# Patient Record
Sex: Female | Born: 1940 | Race: White | Hispanic: No | Marital: Married | Smoking: Never smoker
Health system: Southern US, Community
[De-identification: ages and names within clinical notes are randomized; demographics above are authoritative.]

## PROBLEM LIST (undated history)

## (undated) DIAGNOSIS — F0390 Unspecified dementia without behavioral disturbance: Secondary | ICD-10-CM

## (undated) DIAGNOSIS — E039 Hypothyroidism, unspecified: Secondary | ICD-10-CM

## (undated) DIAGNOSIS — E669 Obesity, unspecified: Secondary | ICD-10-CM

## (undated) DIAGNOSIS — M1990 Unspecified osteoarthritis, unspecified site: Secondary | ICD-10-CM

## (undated) DIAGNOSIS — F419 Anxiety disorder, unspecified: Secondary | ICD-10-CM

## (undated) DIAGNOSIS — K219 Gastro-esophageal reflux disease without esophagitis: Secondary | ICD-10-CM

## (undated) DIAGNOSIS — E119 Type 2 diabetes mellitus without complications: Secondary | ICD-10-CM

## (undated) DIAGNOSIS — I10 Essential (primary) hypertension: Secondary | ICD-10-CM

## (undated) DIAGNOSIS — M545 Low back pain: Secondary | ICD-10-CM

## (undated) DIAGNOSIS — Z86010 Personal history of colonic polyps: Secondary | ICD-10-CM

## (undated) DIAGNOSIS — M47812 Spondylosis without myelopathy or radiculopathy, cervical region: Secondary | ICD-10-CM

## (undated) DIAGNOSIS — E785 Hyperlipidemia, unspecified: Secondary | ICD-10-CM

## (undated) DIAGNOSIS — F329 Major depressive disorder, single episode, unspecified: Secondary | ICD-10-CM

## (undated) DIAGNOSIS — D649 Anemia, unspecified: Secondary | ICD-10-CM

## (undated) DIAGNOSIS — F32A Depression, unspecified: Secondary | ICD-10-CM

## (undated) HISTORY — DX: Gastro-esophageal reflux disease without esophagitis: K21.9

## (undated) HISTORY — DX: Unspecified osteoarthritis, unspecified site: M19.90

## (undated) HISTORY — PX: TUBAL LIGATION: SHX77

## (undated) HISTORY — PX: HEMORRHOID SURGERY: SHX153

## (undated) HISTORY — DX: Obesity, unspecified: E66.9

## (undated) HISTORY — PX: TONSILLECTOMY: SHX5217

## (undated) HISTORY — DX: Low back pain: M54.5

## (undated) HISTORY — DX: Personal history of colonic polyps: Z86.010

## (undated) HISTORY — DX: Essential (primary) hypertension: I10

## (undated) HISTORY — DX: Type 2 diabetes mellitus without complications: E11.9

## (undated) HISTORY — DX: Hyperlipidemia, unspecified: E78.5

## (undated) HISTORY — DX: Hypothyroidism, unspecified: E03.9

## (undated) HISTORY — DX: Spondylosis without myelopathy or radiculopathy, cervical region: M47.812

---

## 2001-07-12 ENCOUNTER — Other Ambulatory Visit: Admission: RE | Admit: 2001-07-12 | Discharge: 2001-07-12 | Payer: Self-pay | Admitting: Obstetrics and Gynecology

## 2002-10-10 ENCOUNTER — Other Ambulatory Visit: Admission: RE | Admit: 2002-10-10 | Discharge: 2002-10-10 | Payer: Self-pay | Admitting: Obstetrics and Gynecology

## 2003-10-16 ENCOUNTER — Other Ambulatory Visit: Admission: RE | Admit: 2003-10-16 | Discharge: 2003-10-16 | Payer: Self-pay | Admitting: Obstetrics and Gynecology

## 2004-02-24 ENCOUNTER — Ambulatory Visit: Payer: Self-pay | Admitting: Internal Medicine

## 2004-06-08 ENCOUNTER — Ambulatory Visit: Payer: Self-pay | Admitting: Internal Medicine

## 2004-07-27 ENCOUNTER — Ambulatory Visit: Payer: Self-pay | Admitting: Licensed Clinical Social Worker

## 2004-08-24 ENCOUNTER — Ambulatory Visit: Payer: Self-pay | Admitting: Internal Medicine

## 2004-09-10 ENCOUNTER — Ambulatory Visit: Payer: Self-pay | Admitting: Internal Medicine

## 2004-12-28 ENCOUNTER — Ambulatory Visit: Payer: Self-pay | Admitting: Internal Medicine

## 2005-04-09 ENCOUNTER — Ambulatory Visit: Payer: Self-pay | Admitting: Internal Medicine

## 2005-08-09 ENCOUNTER — Ambulatory Visit: Payer: Self-pay | Admitting: Internal Medicine

## 2005-11-08 ENCOUNTER — Ambulatory Visit: Payer: Self-pay | Admitting: Internal Medicine

## 2006-02-09 ENCOUNTER — Ambulatory Visit: Payer: Self-pay | Admitting: Internal Medicine

## 2006-07-07 ENCOUNTER — Ambulatory Visit: Payer: Self-pay | Admitting: Internal Medicine

## 2006-10-12 ENCOUNTER — Ambulatory Visit: Payer: Self-pay | Admitting: Internal Medicine

## 2006-10-12 LAB — CONVERTED CEMR LAB
AST: 15 units/L (ref 0–37)
Albumin: 3.9 g/dL (ref 3.5–5.2)
Basophils Absolute: 0 10*3/uL (ref 0.0–0.1)
Chloride: 101 meq/L (ref 96–112)
Direct LDL: 134.7 mg/dL
Eosinophils Absolute: 0.2 10*3/uL (ref 0.0–0.6)
GFR calc Af Amer: 92 mL/min
GFR calc non Af Amer: 76 mL/min
HCT: 45.2 % (ref 36.0–46.0)
HDL: 50.1 mg/dL (ref >39.0)
Hgb A1c MFr Bld: 7.5 % — ABNORMAL HIGH (ref 4.6–6.0)
MCHC: 33.3 g/dL (ref 30.0–36.0)
MCV: 88.8 fL (ref 78.0–100.0)
Neutro Abs: 4 10*3/uL (ref 1.4–7.7)
Neutrophils Relative %: 54.5 % (ref 43.0–77.0)
Potassium: 3.5 meq/L (ref 3.5–5.1)
RBC: 5.09 M/uL (ref 3.87–5.11)
Sodium: 142 meq/L (ref 135–145)
TSH: 0.52 microintl units/mL (ref 0.35–5.50)
Triglycerides: 143 mg/dL (ref 0–149)

## 2006-10-14 ENCOUNTER — Encounter: Payer: Self-pay | Admitting: Internal Medicine

## 2006-10-14 DIAGNOSIS — K219 Gastro-esophageal reflux disease without esophagitis: Secondary | ICD-10-CM

## 2006-10-14 DIAGNOSIS — M1990 Unspecified osteoarthritis, unspecified site: Secondary | ICD-10-CM

## 2006-10-14 DIAGNOSIS — E785 Hyperlipidemia, unspecified: Secondary | ICD-10-CM

## 2006-10-14 DIAGNOSIS — I10 Essential (primary) hypertension: Secondary | ICD-10-CM

## 2006-10-14 DIAGNOSIS — E039 Hypothyroidism, unspecified: Secondary | ICD-10-CM

## 2006-10-14 HISTORY — DX: Unspecified osteoarthritis, unspecified site: M19.90

## 2006-10-14 HISTORY — DX: Gastro-esophageal reflux disease without esophagitis: K21.9

## 2006-10-14 HISTORY — DX: Hypothyroidism, unspecified: E03.9

## 2006-10-14 HISTORY — DX: Hyperlipidemia, unspecified: E78.5

## 2006-10-14 HISTORY — DX: Essential (primary) hypertension: I10

## 2007-02-06 ENCOUNTER — Encounter: Payer: Self-pay | Admitting: Internal Medicine

## 2007-03-24 ENCOUNTER — Encounter: Payer: Self-pay | Admitting: Internal Medicine

## 2007-03-30 ENCOUNTER — Ambulatory Visit: Payer: Self-pay | Admitting: Internal Medicine

## 2007-06-13 ENCOUNTER — Telehealth: Payer: Self-pay | Admitting: Internal Medicine

## 2007-06-30 ENCOUNTER — Telehealth: Payer: Self-pay | Admitting: Internal Medicine

## 2007-06-30 ENCOUNTER — Encounter: Payer: Self-pay | Admitting: Internal Medicine

## 2007-08-03 ENCOUNTER — Ambulatory Visit: Payer: Self-pay | Admitting: Internal Medicine

## 2007-08-04 DIAGNOSIS — M545 Low back pain, unspecified: Secondary | ICD-10-CM

## 2007-08-04 HISTORY — DX: Low back pain, unspecified: M54.50

## 2007-10-12 ENCOUNTER — Encounter: Payer: Self-pay | Admitting: Internal Medicine

## 2007-11-02 ENCOUNTER — Ambulatory Visit: Payer: Self-pay | Admitting: Internal Medicine

## 2007-11-07 ENCOUNTER — Telehealth: Payer: Self-pay | Admitting: Internal Medicine

## 2007-11-20 ENCOUNTER — Telehealth: Payer: Self-pay | Admitting: Internal Medicine

## 2007-12-29 ENCOUNTER — Telehealth: Payer: Self-pay | Admitting: Internal Medicine

## 2008-02-07 ENCOUNTER — Ambulatory Visit: Payer: Self-pay | Admitting: Internal Medicine

## 2008-02-07 DIAGNOSIS — Z860100 Personal history of colon polyps, unspecified: Secondary | ICD-10-CM | POA: Insufficient documentation

## 2008-02-07 HISTORY — DX: Personal history of colonic polyps: Z86.010

## 2008-02-07 HISTORY — DX: Personal history of colon polyps, unspecified: Z86.0100

## 2008-02-27 ENCOUNTER — Telehealth: Payer: Self-pay | Admitting: Internal Medicine

## 2008-06-13 ENCOUNTER — Ambulatory Visit: Payer: Self-pay | Admitting: Internal Medicine

## 2008-06-14 ENCOUNTER — Telehealth: Payer: Self-pay | Admitting: Internal Medicine

## 2008-07-15 ENCOUNTER — Ambulatory Visit: Payer: Self-pay | Admitting: Internal Medicine

## 2008-07-15 DIAGNOSIS — M25569 Pain in unspecified knee: Secondary | ICD-10-CM

## 2008-09-03 ENCOUNTER — Encounter: Payer: Self-pay | Admitting: Internal Medicine

## 2008-09-09 ENCOUNTER — Ambulatory Visit: Payer: Self-pay | Admitting: Internal Medicine

## 2008-09-09 DIAGNOSIS — M79609 Pain in unspecified limb: Secondary | ICD-10-CM

## 2008-09-09 DIAGNOSIS — L259 Unspecified contact dermatitis, unspecified cause: Secondary | ICD-10-CM

## 2008-12-19 ENCOUNTER — Telehealth: Payer: Self-pay | Admitting: Internal Medicine

## 2009-01-06 ENCOUNTER — Ambulatory Visit: Payer: Self-pay | Admitting: Internal Medicine

## 2009-02-07 ENCOUNTER — Telehealth: Payer: Self-pay | Admitting: Internal Medicine

## 2009-04-04 ENCOUNTER — Telehealth: Payer: Self-pay | Admitting: Internal Medicine

## 2009-04-24 ENCOUNTER — Ambulatory Visit: Payer: Self-pay | Admitting: Internal Medicine

## 2009-05-01 ENCOUNTER — Telehealth: Payer: Self-pay | Admitting: Internal Medicine

## 2009-06-09 ENCOUNTER — Telehealth: Payer: Self-pay | Admitting: Internal Medicine

## 2009-06-30 ENCOUNTER — Telehealth: Payer: Self-pay | Admitting: Internal Medicine

## 2009-07-01 ENCOUNTER — Telehealth: Payer: Self-pay | Admitting: Internal Medicine

## 2009-07-03 ENCOUNTER — Telehealth: Payer: Self-pay

## 2009-07-28 ENCOUNTER — Ambulatory Visit: Payer: Self-pay | Admitting: Internal Medicine

## 2009-07-30 LAB — CONVERTED CEMR LAB: Hgb A1c MFr Bld: 8.2 % — ABNORMAL HIGH (ref 4.6–6.5)

## 2009-07-31 ENCOUNTER — Telehealth: Payer: Self-pay | Admitting: Internal Medicine

## 2009-08-04 ENCOUNTER — Encounter: Payer: Self-pay | Admitting: Internal Medicine

## 2009-08-20 ENCOUNTER — Encounter: Payer: Self-pay | Admitting: Internal Medicine

## 2009-08-22 ENCOUNTER — Telehealth: Payer: Self-pay | Admitting: Internal Medicine

## 2009-09-01 ENCOUNTER — Encounter: Payer: Self-pay | Admitting: Internal Medicine

## 2009-10-09 ENCOUNTER — Telehealth: Payer: Self-pay

## 2009-10-15 ENCOUNTER — Telehealth: Payer: Self-pay | Admitting: Internal Medicine

## 2009-10-16 ENCOUNTER — Telehealth: Payer: Self-pay | Admitting: Internal Medicine

## 2009-10-27 ENCOUNTER — Ambulatory Visit: Payer: Self-pay | Admitting: Internal Medicine

## 2009-10-27 DIAGNOSIS — E119 Type 2 diabetes mellitus without complications: Secondary | ICD-10-CM | POA: Insufficient documentation

## 2009-10-27 HISTORY — DX: Type 2 diabetes mellitus without complications: E11.9

## 2009-10-27 LAB — CONVERTED CEMR LAB: Blood Glucose, Fingerstick: 112

## 2009-10-28 LAB — CONVERTED CEMR LAB: Hgb A1c MFr Bld: 8.4 % — ABNORMAL HIGH (ref 4.6–6.5)

## 2010-01-27 ENCOUNTER — Telehealth: Payer: Self-pay | Admitting: Internal Medicine

## 2010-02-05 ENCOUNTER — Ambulatory Visit: Payer: Self-pay | Admitting: Internal Medicine

## 2010-02-05 LAB — HM DIABETES FOOT EXAM: HM Diabetic Foot Exam: NORMAL

## 2010-02-05 LAB — CONVERTED CEMR LAB: Blood Glucose, Fingerstick: 129

## 2010-02-06 LAB — CONVERTED CEMR LAB: Hgb A1c MFr Bld: 8.2 % — ABNORMAL HIGH (ref 4.6–6.5)

## 2010-02-27 ENCOUNTER — Encounter: Payer: Self-pay | Admitting: Internal Medicine

## 2010-03-06 ENCOUNTER — Telehealth: Payer: Self-pay | Admitting: Internal Medicine

## 2010-03-16 ENCOUNTER — Telehealth: Payer: Self-pay

## 2010-03-18 ENCOUNTER — Telehealth: Payer: Self-pay

## 2010-05-06 ENCOUNTER — Telehealth: Payer: Self-pay

## 2010-05-07 ENCOUNTER — Telehealth: Payer: Self-pay

## 2010-05-08 ENCOUNTER — Telehealth: Payer: Self-pay | Admitting: Internal Medicine

## 2010-05-14 ENCOUNTER — Ambulatory Visit
Admission: RE | Admit: 2010-05-14 | Discharge: 2010-05-14 | Payer: Self-pay | Source: Home / Self Care | Attending: Internal Medicine | Admitting: Internal Medicine

## 2010-05-14 ENCOUNTER — Other Ambulatory Visit: Payer: Self-pay | Admitting: Internal Medicine

## 2010-05-14 ENCOUNTER — Telehealth: Payer: Self-pay | Admitting: Internal Medicine

## 2010-05-14 LAB — HEMOGLOBIN A1C: Hgb A1c MFr Bld: 7.5 % — ABNORMAL HIGH (ref 4.6–6.5)

## 2010-05-14 LAB — CONVERTED CEMR LAB: Blood Glucose, Fingerstick: 144

## 2010-05-17 LAB — CONVERTED CEMR LAB
ALT: 37 units/L — ABNORMAL HIGH (ref 0–35)
Alkaline Phosphatase: 77 units/L (ref 39–117)
BUN: 18 mg/dL (ref 6–23)
Basophils Absolute: 0.1 10*3/uL (ref 0.0–0.1)
Basophils Absolute: 0.1 10*3/uL (ref 0.0–0.1)
Basophils Relative: 0.9 % (ref 0.0–3.0)
Bilirubin, Direct: 0.1 mg/dL (ref 0.0–0.3)
Bilirubin, Direct: 0.1 mg/dL (ref 0.0–0.3)
CO2: 27 meq/L (ref 19–32)
Calcium: 8.8 mg/dL (ref 8.4–10.5)
Calcium: 9.3 mg/dL (ref 8.4–10.5)
Chloride: 106 meq/L (ref 96–112)
Cholesterol: 198 mg/dL (ref 0–200)
Cholesterol: 204 mg/dL — ABNORMAL HIGH (ref 0–200)
Creatinine, Ser: 0.8 mg/dL (ref 0.4–1.2)
Creatinine, Ser: 0.8 mg/dL (ref 0.4–1.2)
Direct LDL: 131.7 mg/dL
Eosinophils Absolute: 0.1 10*3/uL (ref 0.0–0.7)
GFR calc Af Amer: 92 mL/min
Glucose, Bld: 140 mg/dL — ABNORMAL HIGH (ref 70–99)
HCT: 44.3 % (ref 36.0–46.0)
Hemoglobin: 15.5 g/dL — ABNORMAL HIGH (ref 12.0–15.0)
Lymphocytes Relative: 41.7 % (ref 12.0–46.0)
MCHC: 32.4 g/dL (ref 30.0–36.0)
MCHC: 35.1 g/dL (ref 30.0–36.0)
MCV: 88.8 fL (ref 78.0–100.0)
MCV: 91.3 fL (ref 78.0–100.0)
Monocytes Absolute: 0.4 10*3/uL (ref 0.1–1.0)
Monocytes Absolute: 0.5 10*3/uL (ref 0.1–1.0)
Neutro Abs: 3 10*3/uL (ref 1.4–7.7)
Neutrophils Relative %: 48.5 % (ref 43.0–77.0)
Platelets: 278 10*3/uL (ref 150.0–400.0)
RBC: 4.92 M/uL (ref 3.87–5.11)
RBC: 4.99 M/uL (ref 3.87–5.11)
RDW: 13.6 % (ref 11.5–14.6)
RDW: 13.6 % (ref 11.5–14.6)
Sodium: 144 meq/L (ref 135–145)
TSH: 1.72 microintl units/mL (ref 0.35–5.50)
Total Bilirubin: 0.8 mg/dL (ref 0.3–1.2)
Total Bilirubin: 0.9 mg/dL (ref 0.3–1.2)
Total CHOL/HDL Ratio: 4
Total Protein: 6.7 g/dL (ref 6.0–8.3)
Total Protein: 7.2 g/dL (ref 6.0–8.3)
Triglycerides: 146 mg/dL (ref 0.0–149.0)
Triglycerides: 88 mg/dL (ref 0–149)

## 2010-05-21 NOTE — Progress Notes (Signed)
Summary: ? about metformin  Phone Note Call from Patient Call back at Home Phone [phone]   Caller: Patient--triage vm Summary of Call: she is confused about her metformin rx. she has 2 different rxs. Is it 500mg  two times a day or 1000mg  1qd. she has been dizzy and wonders if it the confusion with directions? leave a detailed message. Initial call taken by: Warnell Forester,  March 18, 2010 10:26 AM  Follow-up for Phone Call        pt came by office with pill bottles to discuss meds and how to take. confusion r/t different shape pills in bottles. [facility]. KIK Follow-up by: Duard Brady LPN,  March 18, 2010 12:33 PM

## 2010-05-21 NOTE — Assessment & Plan Note (Signed)
Summary: 3 month fup///ccm pt rsc due back pain/njr   Vital Signs:  Patient profile:   70 year old female Height:      63 inches Weight:      216 pounds BMI:     38.40 Temp:     98.3 degrees F oral Pulse rate:   72 / minute Resp:     14 per minute BP sitting:   144 / 80  (left arm)  Vitals Entered By: Willy Eddy, LPN (February 05, 2010 2:47 PM) CC: roa- was given some diclofinac that she really liked- would like new script Is Patient Diabetic? Yes Did you bring your meter with you today? No CBG Result 129   CC:  roa- was given some diclofinac that she really liked- would like new script.  History of Present Illness: 35 -year-old patient who is seen today for follow-up of her type 2 diabetes.  She has hypertension, dyslipidemia, and osteoarthritis.  She complains of persistent arthritic pain.  She wishes to switch to diclofenac on a t.i.d. basis.  She has exogenous obesity.  She is now back on metformin after her hemoglobin A1c was greater than 8.  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 1 Once Daily As Needed 2)  Simvastatin 40 Mg Tabs (Simvastatin) .Marland Kitchen.. 1 Once Daily 3)  Synthroid 175 Mcg Tabs (Levothyroxine Sodium) .Marland Kitchen.. 1 Once Daily 4)  Benicar 20 Mg  Tabs (Olmesartan Medoxomil) .Marland Kitchen.. 1 Once Daily 5)  Prilosec 20 Mg  Cpdr (Omeprazole) .Marland Kitchen.. 1 Once Daily 6)   Naproxen 500 Mg Tabs (Naproxen) .... One Two Times A Day With Meals 7)  Metformin Hcl 500 Mg Tabs (Metformin Hcl) .... Bid 8)  Accu-Chek Aviva  Kit (Blood Glucose Monitoring Suppl) 9)  Accu-Chek Aviva  Strp (Glucose Blood) .... Test Blood Sugar Qd 10)  Accu-Chek Soft Touch Lancets  Misc (Lancets) .... Once Daily and Prn  Allergies (verified): 1)  Amoxicillin (Amoxicillin)  Review of Systems  The patient denies anorexia, fever, weight loss, weight gain, vision loss, decreased hearing, hoarseness, chest pain, syncope, dyspnea on exertion, peripheral edema, prolonged cough, headaches, hemoptysis, abdominal pain, melena, hematochezia, severe indigestion/heartburn, hematuria, incontinence, genital sores, muscle weakness, suspicious skin lesions, transient blindness, difficulty walking, depression, unusual weight change, abnormal bleeding, enlarged lymph nodes, angioedema, and breast masses.    Physical Exam  General:  overweight-appearing.  130/80overweight-appearing.   Head:  Normocephalic and atraumatic without obvious abnormalities. No apparent alopecia or balding. Eyes:  No corneal or conjunctival inflammation noted. EOMI. Perrla. Funduscopic exam benign, without hemorrhages, exudates or papilledema. Vision grossly normal. Mouth:  Oral mucosa and oropharynx without lesions or exudates.  Teeth in good repair. Neck:  No deformities, masses, or tenderness noted. Lungs:  Normal respiratory effort, chest expands symmetrically. Lungs are clear to auscultation, no crackles or wheezes. Heart:  Normal rate and regular rhythm. S1 and S2 normal without  gallop, murmur, click, rub or other extra sounds. Abdomen:  Bowel sounds positive,abdomen soft and non-tender without masses, organomegaly or hernias noted.  Diabetes Management Exam:    Eye Exam:       Eye Exam done here today          Results: normal   Impression & Recommendations:  Problem # 1:  DIABETES MELLITUS, TYPE II (ICD-250.00)  The  following medications were removed from the medication list:    Benicar 20 Mg Tabs (Olmesartan medoxomil) .Marland Kitchen... 1 once daily Her updated medication list for this problem includes:    Metformin Hcl 500 Mg Tabs (Metformin hcl) ..... Bid    Benicar Hct 20-12.5 Mg Tabs (Olmesartan medoxomil-hctz) ..... One daily  Orders: Capillary Blood Glucose/CBG (16109) Venipuncture (60454) TLB-A1C / Hgb A1C (Glycohemoglobin) (83036-A1C) Specimen Handling (09811)  Problem # 2:  DEGENERATIVE JOINT DISEASE (ICD-715.90)  The following medications were removed from the medication list:    Naproxen 500 Mg Tabs (Naproxen) ..... One two times a day with meals Her updated medication list for this problem includes:    Diclofenac Potassium 50 Mg Tabs (Diclofenac potassium) ..... One tablet 3 times daily  Problem # 3:  HYPERTENSION (ICD-401.9)  The following medications were removed from the medication list:    Benicar 20 Mg Tabs (Olmesartan medoxomil) .Marland Kitchen... 1 once daily Her updated medication list for this problem includes:    Benicar Hct 20-12.5 Mg Tabs (Olmesartan medoxomil-hctz) ..... One daily  Complete Medication List: 1)  Phentermine Hcl 37.5 Mg Tabs (Phentermine hcl) .Marland Kitchen.. 1 once daily as needed 2)  Simvastatin 40 Mg Tabs (Simvastatin) .Marland Kitchen.. 1 once daily 3)  Synthroid 175 Mcg Tabs (Levothyroxine sodium) .Marland Kitchen.. 1 once daily 4)  Prilosec 20 Mg Cpdr (Omeprazole) .Marland Kitchen.. 1 once daily 5)  Metformin Hcl 500 Mg Tabs (Metformin hcl) .... Bid 6)  Accu-chek Aviva Strp (Glucose blood) .... Test blood sugar qd 7)  Accu-chek Soft Touch Lancets Misc (Lancets) .... Once daily and prn 8)  Diclofenac Potassium 50 Mg Tabs (Diclofenac potassium) .... One tablet 3 times daily 9)  Benicar Hct 20-12.5 Mg Tabs (Olmesartan medoxomil-hctz) .... One daily  Patient Instructions: 1)  Please schedule a follow-up appointment in 3 months. 2)  It is important that you exercise regularly at least 20 minutes 5 times a week. If you develop  chest pain, have severe difficulty breathing, or feel very tired , stop exercising immediately and seek medical attention. 3)  You need to lose weight. Consider a lower calorie diet and regular exercise.  4)  Check your blood sugars regularly. If your readings are usually above : or below 70 you should contact our office. 5)  It is important that your Diabetic A1c level is checked every 3 months. 6)  See your eye doctor yearly to check for diabetic eye damage. Prescriptions: BENICAR HCT 20-12.5 MG TABS (OLMESARTAN MEDOXOMIL-HCTZ) one daily  #90 x 6   Entered and Authorized by:   Gordy Savers  MD   Signed by:   Gordy Savers  MD on 02/05/2010   Method used:   Print then Give to Patient   RxID:   [id number] DICLOFENAC POTASSIUM 50 MG TABS (DICLOFENAC POTASSIUM) one tablet 3 times daily  #180 x 5   Entered and Authorized by:   Gordy Savers  MD   Signed by:   Gordy Savers  MD on 02/05/2010   Method used:   Print then Give to Patient   RxID:  [id number] ACCU-CHEK SOFT TOUCH LANCETS  MISC (LANCETS) once daily and prn  #90 x 3   Entered and Authorized by:   Gordy Savers  MD   Signed by:   Gordy Savers  MD on 02/05/2010   Method used:   Print then Give to Patient   RxID:   [id number] ACCU-CHEK AVIVA  STRP (GLUCOSE BLOOD) test blood sugar qd  #90 x 4   Entered and Authorized by:   Gordy Savers  MD   Signed by:   Gordy Savers  MD on 02/05/2010   Method used:   Print then Give to Patient   RxID:   [id number] METFORMIN HCL 500 MG TABS (METFORMIN HCL) bid  #180 x 3   Entered and Authorized by:   Gordy Savers  MD   Signed by:   Gordy Savers  MD on 02/05/2010   Method used:   Print then Give to Patient   RxID:   [id number] PRILOSEC 20 MG  CPDR (OMEPRAZOLE) 1 once daily  #90 x 6   Entered and Authorized by:   Gordy Savers  MD   Signed by:   Gordy Savers  MD on 02/05/2010   Method used:    Print then Give to Patient   RxID:   [id number] SYNTHROID 175 MCG TABS (LEVOTHYROXINE SODIUM) 1 once daily  #90 Tablet x 2   Entered and Authorized by:   Gordy Savers  MD   Signed by:   Gordy Savers  MD on 02/05/2010   Method used:   Print then Give to Patient   RxID:   [id number] SIMVASTATIN 40 MG TABS (SIMVASTATIN) 1 once daily  #100 x 6   Entered and Authorized by:   Gordy Savers  MD   Signed by:   Gordy Savers  MD on 02/05/2010   Method used:   Print then Give to Patient   RxID:   [id number] PHENTERMINE HCL 37.5 MG TABS (PHENTERMINE HCL) 1 once daily as needed  #90 x 2   Entered and Authorized by:   Gordy Savers  MD   Signed by:   Gordy Savers  MD on 02/05/2010   Method used:   Print then Give to Patient   RxID:   [id number]    Orders Added: 1)  Capillary Blood Glucose/CBG [82948] 2)  Est. Patient Level III [60737] 3)  Venipuncture [10626] 4)  TLB-A1C / Hgb A1C (Glycohemoglobin) [83036-A1C] 5)  Specimen Handling [99000]

## 2010-05-21 NOTE — Progress Notes (Signed)
Summary: [facility] called and needs clarification on Diclofenac  Phone Note From Pharmacy Call back at [facility]  [phone] Dailey   Caller: [facility]  [place]. #[phone]* Billey Gosling Summary of Call: The pharmacist at [facility] called and said Diclofenac used to be sodium and now it says Potassium. Did dr want to switch her med?  Initial call taken by: Lucy Antigua,  May 14, 2010 2:17 PM  Follow-up for Phone Call        pt indicated as leaving office to me - she was not using/taking this med.   please advise Follow-up by: Duard Brady LPN,  May 14, 2010 2:22 PM  Additional Follow-up for Phone Call Additional follow up Details #1::        d/c order Additional Follow-up by: Gordy Savers  MD,  May 14, 2010 5:13 PM    Additional Follow-up for Phone Call Additional follow up Details #2::    [facility] and [facility]  Follow-up by: Duard Brady LPN,  May 14, 2010 5:27 PM

## 2010-05-21 NOTE — Progress Notes (Signed)
Summary: referral   Phone Note Call from Patient   Caller: Patient Call For: Gordy Savers  MD Summary of Call: Pt is calling for a referral to Dr. Tamera Punt (Physiatrist in [facility]) for back pain. [phone]  Pt:  [phone]        [phone] Initial call taken by: Lynann Beaver CMA,  July 01, 2009 2:10 PM  Follow-up for Phone Call        OK Order sent to Terri. Lynann Beaver [facility]  July 02, 2009 8:15 AM  Follow-up by: Gordy Savers  MD,  July 01, 2009 5:06 PM

## 2010-05-21 NOTE — Progress Notes (Signed)
Summary: Pt req 90day supply of generic Glimpiride to [facility] Note Call from Patient   Caller: Patient Summary of Call: Pt called and said that she need a script for generic Amaryl. Pt only rcvd qty 15, and was told by pharmacy to cut pill in half. Pt wants a 90 day supply, called in to [facility] on [address].   Pt said that this was prescribed by Dr Amador Cunas at her last visit.  Initial call taken by: Lucy Antigua,  March 06, 2010 10:08 AM    New/Updated Medications: GLIMEPIRIDE 4 MG TABS (GLIMEPIRIDE) 1/2 by mouth qday Prescriptions: GLIMEPIRIDE 4 MG TABS (GLIMEPIRIDE) 1/2 by mouth qday  #90 x 3   Entered by:   Duard Brady LPN   Authorized by:   Gordy Savers  MD   Signed by:   Duard Brady LPN on 16/01/9603   Method used:   Faxed to ...       [facility]  [place]. [phone]* (retail)       [address]       [place], [place]  [address]       Ph: [phone]       Fax: [phone]   RxID:   [id number]

## 2010-05-21 NOTE — Assessment & Plan Note (Signed)
Summary: pt wil come in fasting/njr/pt rsc from bmp/cjr   Vital Signs:  Patient profile:   70 year old female Height:      63 inches Weight:      221 pounds BMI:     39.29 BP sitting:   148 / 78  (left arm) Cuff size:   regular  Vitals Entered By: Raechel Ache, RN (April 24, 2009 8:41 AM) CC: OV, fasting. Sees gyn   CC:  OV and fasting. Sees gyn.  History of Present Illness: 70 year old patient who is seen today for a comprehensive evaluation.  Medical problems include history of mild impaired glucose tolerance.  She has arthritis colonic polyps history of gastroesophageal reflux disease, treated hypothyroidism, hypertension, and mild dyslipidemia.  She is on statin therapy. she is followed annually by gynecology.  She has her OB visit and mammogram every summer.  She is up-to-date on her colonoscopies.  Complaints today include weight gain and the pain does have a gym membership that has not been using regularly.  She denies any cardiopulmonary complaints  Allergies: 1)  Amoxicillin (Amoxicillin)  Past History:  Past Medical History: Reviewed history from 02/07/2008 and no changes required. Hyperlipidemia Hypertension Hypothyroidism GERD Obesity DJD Low back pain Colonic polyps, hx of  Past Surgical History: Reviewed history from 08/04/2007 and no changes required. Hemorrhoidectomy Tonsillectomy sigmoidoscopy 1998, 2003 colonoscopy 2008  Family History: Reviewed history from 02/07/2008 and no changes required. father died age 83 complications of diabetes, coronary artery disease mother died age 70, renal failure  Three brothers, one died of renal cancer two sisters, one deceased from endometrial cancer  Social History: Reviewed history from 02/07/2008 and no changes required. Divorced Never Smoked  Review of Systems       The patient complains of weight gain.  The patient denies anorexia, fever, weight loss, vision loss, decreased hearing, hoarseness,  chest pain, syncope, dyspnea on exertion, peripheral edema, prolonged cough, headaches, hemoptysis, abdominal pain, melena, hematochezia, severe indigestion/heartburn, hematuria, incontinence, genital sores, muscle weakness, suspicious skin lesions, transient blindness, difficulty walking, depression, unusual weight change, abnormal bleeding, enlarged lymph nodes, angioedema, and breast masses.    Physical Exam  General:  overweight-appearing.  normal blood pressure Head:  Normocephalic and atraumatic without obvious abnormalities. No apparent alopecia or balding. Eyes:  No corneal or conjunctival inflammation noted. EOMI. Perrla. Funduscopic exam benign, without hemorrhages, exudates or papilledema. Vision grossly normal. Ears:  External ear exam shows no significant lesions or deformities.  Otoscopic examination reveals clear canals, tympanic membranes are intact bilaterally without bulging, retraction, inflammation or discharge. Hearing is grossly normal bilaterally. Nose:  External nasal examination shows no deformity or inflammation. Nasal mucosa are pink and moist without lesions or exudates. Mouth:  Oral mucosa and oropharynx without lesions or exudates.  Teeth in good repair. Neck:  No deformities, masses, or tenderness noted. Chest Wall:  No deformities, masses, or tenderness noted. Breasts:  No mass, nodules, thickening, tenderness, bulging, retraction, inflamation, nipple discharge or skin changes noted.   Lungs:  Normal respiratory effort, chest expands symmetrically. Lungs are clear to auscultation, no crackles or wheezes. Heart:  Normal rate and regular rhythm. S1 and S2 normal without gallop, murmur, click, rub or other extra sounds. Abdomen:  Bowel sounds positive,abdomen soft and non-tender without masses, organomegaly or hernias noted. Msk:  No deformity or scoliosis noted of thoracic or lumbar spine.   Pulses:  the left dorsalis pedis pulse diminished Extremities:  No clubbing,  cyanosis, edema, or deformity noted  with normal full range of motion of all joints.   Neurologic:  alert & oriented X3, cranial nerves II-XII intact, strength normal in all extremities, sensation intact to light touch, and gait normal.  alert & oriented X3, cranial nerves II-XII intact, strength normal in all extremities, sensation intact to light touch, and gait normal.   Skin:  Intact without suspicious lesions or rashes Cervical Nodes:  No lymphadenopathy noted Axillary Nodes:  No palpable lymphadenopathy Inguinal Nodes:  No significant adenopathy Psych:  Cognition and judgment appear intact. Alert and cooperative with normal attention span and concentration. No apparent delusions, illusions, hallucinations   Impression & Recommendations:  Problem # 1:  IMPAIRED GLUCOSE TOLERANCE (ICD-271.3)  Orders: TLB-BMP (Basic Metabolic Panel-BMET) (80048-METABOL) TLB-CBC Platelet - w/Differential (85025-CBCD) TLB-Hepatic/Liver Function Pnl (80076-HEPATIC)  Problem # 2:  LOW BACK PAIN (ICD-724.2)  Her updated medication list for this problem includes:    Tramadol Hcl 50 Mg Tabs (Tramadol hcl) ..... One by mouth q 6 hrs as needed pain    Naproxen Dr 500 Mg Tbec (Naproxen) ..... One twice daily    Meloxicam 15 Mg Tabs (Meloxicam) ..... One daily  Her updated medication list for this problem includes:    Tramadol Hcl 50 Mg Tabs (Tramadol hcl) ..... One by mouth q 6 hrs as needed pain    Naproxen Dr 500 Mg Tbec (Naproxen) ..... One twice daily    Meloxicam 15 Mg Tabs (Meloxicam) ..... One daily  Problem # 3:  DEGENERATIVE JOINT DISEASE (ICD-715.90)  Her updated medication list for this problem includes:    Tramadol Hcl 50 Mg Tabs (Tramadol hcl) ..... One by mouth q 6 hrs as needed pain    Naproxen Dr 500 Mg Tbec (Naproxen) ..... One twice daily    Meloxicam 15 Mg Tabs (Meloxicam) ..... One daily  Her updated medication list for this problem includes:    Tramadol Hcl 50 Mg Tabs (Tramadol hcl)  ..... One by mouth q 6 hrs as needed pain    Naproxen Dr 500 Mg Tbec (Naproxen) ..... One twice daily    Meloxicam 15 Mg Tabs (Meloxicam) ..... One daily  Orders: TLB-BMP (Basic Metabolic Panel-BMET) (80048-METABOL) TLB-CBC Platelet - w/Differential (85025-CBCD) TLB-Hepatic/Liver Function Pnl (80076-HEPATIC)  Problem # 4:  HYPOTHYROIDISM (ICD-244.9)  Her updated medication list for this problem includes:    Synthroid 175 Mcg Tabs (Levothyroxine sodium) .Marland Kitchen... 1 once daily  Her updated medication list for this problem includes:    Synthroid 175 Mcg Tabs (Levothyroxine sodium) .Marland Kitchen... 1 once daily  Orders: TLB-BMP (Basic Metabolic Panel-BMET) (80048-METABOL) TLB-CBC Platelet - w/Differential (85025-CBCD) TLB-Hepatic/Liver Function Pnl (80076-HEPATIC) TLB-TSH (Thyroid Stimulating Hormone) (84443-TSH)  Problem # 5:  HYPERLIPIDEMIA (ICD-272.4)  Her updated medication list for this problem includes:    Simvastatin 40 Mg Tabs (Simvastatin) .Marland Kitchen... 1 once daily  Her updated medication list for this problem includes:    Simvastatin 40 Mg Tabs (Simvastatin) .Marland Kitchen... 1 once daily  Orders: Venipuncture (16109) TLB-Lipid Panel (80061-LIPID) TLB-BMP (Basic Metabolic Panel-BMET) (80048-METABOL) TLB-CBC Platelet - w/Differential (85025-CBCD) TLB-Hepatic/Liver Function Pnl (80076-HEPATIC)  Problem # 6:  HYPERTENSION (ICD-401.9)  Her updated medication list for this problem includes:    Benicar 20 Mg Tabs (Olmesartan medoxomil) .Marland Kitchen... 1 once daily  Orders: EKG w/ Interpretation (93000) TLB-BMP (Basic Metabolic Panel-BMET) (80048-METABOL) TLB-CBC Platelet - w/Differential (85025-CBCD) TLB-Hepatic/Liver Function Pnl (80076-HEPATIC)  Complete Medication List: 1)  Phentermine Hcl 37.5 Mg Tabs (Phentermine hcl) .Marland Kitchen.. 1 once daily as needed 2)  Simvastatin 40 Mg Tabs (Simvastatin) .Marland KitchenMarland KitchenMarland Kitchen  1 once daily 3)  Synthroid 175 Mcg Tabs (Levothyroxine sodium) .Marland Kitchen.. 1 once daily 4)  Benicar 20 Mg Tabs  (Olmesartan medoxomil) .Marland Kitchen.. 1 once daily 5)  Prilosec 20 Mg Cpdr (Omeprazole) .Marland Kitchen.. 1 once daily 6)  Triamcinolone Acetonide 0.1 % Crea (Triamcinolone acetonide) .... Use twice daily 7)  Tramadol Hcl 50 Mg Tabs (Tramadol hcl) .... One by mouth q 6 hrs as needed pain 8)  Naproxen Dr 500 Mg Tbec (Naproxen) .... One twice daily 9)  Meloxicam 15 Mg Tabs (Meloxicam) .... One daily  Patient Instructions: 1)  Please schedule a follow-up appointment in 6 months. 2)  Limit your Sodium (Salt) to less than 2 grams a day(slightly less than 1/2 a teaspoon) to prevent fluid retention, swelling, or worsening of symptoms. 3)  It is important that you exercise regularly at least 20 minutes 5 times a week. If you develop chest pain, have severe difficulty breathing, or feel very tired , stop exercising immediately and seek medical attention. 4)  You need to lose weight. Consider a lower calorie diet and regular exercise.  Prescriptions: NAPROXEN DR 500 MG TBEC (NAPROXEN) one twice daily  #180 x 6   Entered and Authorized by:   Gordy Savers  MD   Signed by:   Gordy Savers  MD on 04/24/2009   Method used:   Print then Give to Patient   RxID:   [id number] TRAMADOL HCL 50 MG  TABS (TRAMADOL HCL) one by mouth q 6 hrs as needed pain  #60 x 4   Entered and Authorized by:   Gordy Savers  MD   Signed by:   Gordy Savers  MD on 04/24/2009   Method used:   Print then Give to Patient   RxID:   [id number] PRILOSEC 20 MG  CPDR (OMEPRAZOLE) 1 once daily  #90 x 6   Entered and Authorized by:   Gordy Savers  MD   Signed by:   Gordy Savers  MD on 04/24/2009   Method used:   Print then Give to Patient   RxID:   [id number] BENICAR 20 MG  TABS (OLMESARTAN MEDOXOMIL) 1 once daily  #90 x 6   Entered and Authorized by:   Gordy Savers  MD   Signed by:   Gordy Savers  MD on 04/24/2009   Method used:   Print then Give to Patient   RxID:    [id number] SYNTHROID 175 MCG TABS (LEVOTHYROXINE SODIUM) 1 once daily  #100 x 6   Entered and Authorized by:   Gordy Savers  MD   Signed by:   Gordy Savers  MD on 04/24/2009   Method used:   Print then Give to Patient   RxID:   [id number] SIMVASTATIN 40 MG TABS (SIMVASTATIN) 1 once daily  #100 x 6   Entered and Authorized by:   Gordy Savers  MD   Signed by:   Gordy Savers  MD on 04/24/2009   Method used:   Print then Give to Patient   RxID:   [id number]

## 2010-05-21 NOTE — Progress Notes (Signed)
Summary: request for call    Caller: Patient Call For: Gordy Savers  MD Summary of Call: Pt wants Dr. Kirtland Bouchard to review records from Dr. Tamera Punt from [facility], and give pt a call. [phone] Initial call taken by: Lynann Beaver CMA,  Aug 22, 2009 10:13 AM  Follow-up for Phone Call        Please call her back at these two numbers: [phone] Follow-up by: Gordy Savers  MD,  Aug 25, 2009 5:17 PM

## 2010-05-21 NOTE — Progress Notes (Signed)
Summary: REQ FOR RESULTS.  Phone Note Call from Patient   Caller: Patient @ [phone] Reason for Call: Talk to Nurse Summary of Call: Pt called to adv that she has not heard anything about the results of her labwrk that she had done recently..... Pt req a return call @ [phone].  Initial call taken by: Debbra Riding,  May 01, 2009 9:28 AM    called

## 2010-05-21 NOTE — Progress Notes (Signed)
Summary: med refill  Phone Note Refill Request Call back at Home Phone [phone] Message from:  Patient on [facility] [address] [phone]  Refills Requested: Medication #1:  BENICAR HCT 20-12.5 MG TABS one daily pt needs med refill of benicar only  Caller: Patient  Follow-up for Phone Call        [facility] call - ans mach - LMTCB if questions. KIK Follow-up by: Duard Brady LPN,  May 07, 2010 3:11 PM    Prescriptions: BENICAR HCT 20-12.5 MG TABS (OLMESARTAN MEDOXOMIL-HCTZ) one daily  #90 x 3   Entered by:   Duard Brady LPN   Authorized by:   Gordy Savers  MD   Signed by:   Duard Brady LPN on 65/78/4696   Method used:   Faxed to ...       [facility]  [place]. [phone]* (retail)       [address]       [place], [place]  [address]       Ph: [phone]       Fax: [phone]   RxID:   [id number]

## 2010-05-21 NOTE — Progress Notes (Signed)
Summary: Pt req info re: Sleep Apnea and interested in being tested  Phone Note Call from Patient Call back at Home Phone [phone]   Caller: Patient Summary of Call: Pt is wanting to know if Dr. Amador Cunas had any info available re: sleep apnea. She would like to pick up info when available. Pt also would like to ge tested for this.  Initial call taken by: Lucy Antigua,  June 09, 2009 9:52 AM    called and discussed;  Appended Document: Pt req info re: Sleep Apnea and interested in being tested Dr. Kirtland Bouchard called pt.  KIK

## 2010-05-21 NOTE — Progress Notes (Signed)
Summary: problems with Metformin  Phone Note Call from Patient   Caller: Patient Call For: Gordy Savers  MD Summary of Call: Pt took 2 Metformin, and it made her very weak, and groggy.  Wants to take it once daily.  Did not test her BS. [facility] [place]) [phone] Initial call taken by: Lynann Beaver CMA,  July 31, 2009 12:11 PM  Follow-up for Phone Call        okay to decrease to once daily for two weeks; then try  one twice daily Follow-up by: Gordy Savers  MD,  July 31, 2009 12:45 PM  Additional Follow-up for Phone Call Additional follow up Details #1::        Pt given Dr. Charm Rings recommendations. Additional Follow-up by: Lynann Beaver CMA,  July 31, 2009 1:02 PM

## 2010-05-21 NOTE — Consult Note (Signed)
Summary: [facility]  [facility]   Imported By: Maryln Gottron 09/10/2009 12:26:03  _____________________________________________________________________  External Attachment:    Type:   Image     Comment:   External Document

## 2010-05-21 NOTE — Progress Notes (Signed)
Summary: need copy of med list  Phone Note Call from Patient   Caller: Patient Reason for Call: Talk to Nurse Summary of Call: need copy of current med lis will pick up after lunch  KIK Initial call taken by: Duard Brady LPN,  March 16, 2010 10:32 AM  Follow-up for Phone Call        will get ready for her Follow-up by: Duard Brady LPN,  March 16, 2010 10:32 AM  Additional Follow-up for Phone Call Additional follow up Details #1::        list prepared and ready for pick up   KIk Additional Follow-up by: Duard Brady LPN,  March 16, 2010 12:07 PM

## 2010-05-21 NOTE — Progress Notes (Signed)
Summary:  glucose monitor,test strips , lancets  Phone Note Call from Patient   Caller: Patient Call For: Gordy Savers  MD Summary of Call: Pt is asking to start checking her BSs, and needs the necessary equipment called to [facility] on [address]. [phone] Initial call taken by: Lynann Beaver CMA,  October 15, 2009 9:55 AM  Follow-up for Phone Call        forward to Dr.Kwiatkowski to advise how offten to check BS if required. KIK Follow-up by: Duard Brady LPN,  October 15, 2009 10:13 AM  Additional Follow-up for Phone Call Additional follow up Details #1::        have patient drop by for complimentary glucometer and call in Rx for strips; check daily as desired Additional Follow-up by: Gordy Savers  MD,  October 15, 2009 12:42 PM    New/Updated Medications: ACCU-CHEK AVIVA  KIT (BLOOD GLUCOSE MONITORING SUPPL)  ACCU-CHEK AVIVA  STRP (GLUCOSE BLOOD) test blood sugar qd ACCU-CHEK SOFT TOUCH LANCETS  MISC (LANCETS) once daily and prn Prescriptions: ACCU-CHEK SOFT TOUCH LANCETS  MISC (LANCETS) once daily and prn  #90 x 3   Entered by:   Duard Brady LPN   Authorized by:   Gordy Savers  MD   Signed by:   Duard Brady LPN on 45/40/9811   Method used:   Electronically to        [facility]. [phone]* (retail)       [address]       [place], [place]  [address]       Ph: [phone]       Fax: [phone]   RxID:   [id number] ACCU-CHEK AVIVA  STRP (GLUCOSE BLOOD) test blood sugar qd  #90 x 4   Entered by:   Duard Brady LPN   Authorized by:   Gordy Savers  MD   Signed by:   Duard Brady LPN on 34/74/2595   Method used:   Electronically to        [facility]. [phone]* (retail)       [address]       [place], [place]  [address]       Ph: [phone]       Fax: [phone]   RxID:   [id number]   Appended Document:  glucose monitor,test strips ,  lancets pt aware to come pick up monitor. other supplies to [facility]. KIK

## 2010-05-21 NOTE — Progress Notes (Signed)
Summary: sciatica  Phone Note Call from Patient Call back at Home Phone [phone] Call back at Call back after 4pm or LM   Summary of Call: While exercising Sat pain left hip & back.  Instructor said it is sciatic nerve.  Over the years, she's been told muscle relaxer, pain med, patches, but sciatic nerve never mentioned until Sat.  Anything to treat this sciatic nerve pain?  When she touches it, it's tender in her hip, walking bothers her.  Declined appt saying she's not sick, but would like to have med specific for sciatca that won't keep her from going.  RA Friendly.  Allerigc to amox. Vanquish causes stomach bleeding Please call her to discuss before calling anything in as she may have it on her shelf Initial call taken by: Rudy Jew, RN,  June 30, 2009 10:03 AM  Follow-up for Phone Call        naproxen 500 mg, number 30, take one twice daily with meals Follow-up by: Gordy Savers  MD,  June 30, 2009 10:56 AM  Additional Follow-up for Phone Call Additional follow up Details #1::        Phone Call Completed Additional Follow-up by: Rudy Jew, RN,  June 30, 2009 3:00 PM    New/Updated Medications: NAPROXEN 500 MG TABS (NAPROXEN) One two times a day with meals Prescriptions: NAPROXEN 500 MG TABS (NAPROXEN) One two times a day with meals  #30 x 0   Entered by:   Rudy Jew, RN   Authorized by:   Gordy Savers  MD   Signed by:   Rudy Jew, RN on 06/30/2009   Method used:   Electronically to        [facility]. [phone]* (retail)       [address]       [place], [place]  [address]       Ph: [phone]       Fax: [phone]   RxID:   [id number]

## 2010-05-21 NOTE — Progress Notes (Signed)
Summary: Pt confused about meds she is suppose to take  Phone Note Call from Patient Call back at Home Phone [phone]   Caller: Patient Summary of Call: Pt called and said that she is still confused about the meds she is suppose to be taking. Pt has a sample bottle that says Benicar, but pills inside don't look the same. Pt is also running low on Metformin. Pharmacy gave pt 1000mg  of Metformin and pt has been cutting them in half. Pls leave detailed vm if pt not available.   Initial call taken by: Lucy Antigua,  May 06, 2010 2:04 PM  Follow-up for Phone Call        attempt to call- ans mach - LMTCB if questions - metformin 500mg  two times a day is what she should be taking , ok to cut pills in half , but I dont know why pharmacy gave 1000mg  - not what has been rx'd. As for the benicar - dont know what pills are in sample bottle and would not advise taking in different types of pill within bottle. kik Follow-up by: Duard Brady LPN,  May 06, 2010 4:03 PM

## 2010-05-21 NOTE — Progress Notes (Signed)
Summary: Benicar approved effective 10-15-2009 with no expiration date  Phone Note Other Incoming   Caller: tiffany @ [employer] of Call: Benicar approved effective 10-15-2009 with no expiration date. Initial call taken by: Warnell Forester,  October 16, 2009 1:54 PM

## 2010-05-21 NOTE — Progress Notes (Signed)
Summary: WAITING ON PA  Phone Note Call from Patient Call back at Home Phone [phone]   Caller: Patient Call For: Gordy Savers  MD Summary of Call: pt needs samples of benicar 20MG   waiting on Pa approval Initial call taken by: Heron Sabins,  October 09, 2009 4:28 PM  Follow-up for Phone Call        PT IS AWARE SAMPLES UP FRONT Follow-up by: Heron Sabins,  October 09, 2009 4:48 PM  Additional Follow-up for Phone Call Additional follow up Details #1::        noted , samples given KIK Additional Follow-up by: Duard Brady LPN,  October 09, 2009 4:59 PM

## 2010-05-21 NOTE — Assessment & Plan Note (Signed)
Summary: 3 month follow up/cjr/pt rescd//ccm   Vital Signs:  Patient profile:   70 year old female Weight:      217 pounds Temp:     98.4 degrees F oral BP sitting:   128 / 80  (right arm) Cuff size:   regular  Vitals Entered By: Duard Brady LPN (May 14, 2010 1:07 PM) CC: 3 mos rov- doing ok   c/o shpulder, hip, knee pain Is Patient Diabetic? Yes CBG Result 144   CC:  3 mos rov- doing ok   c/o shpulder, hip, and knee pain.  History of Present Illness: 70 year old patient who is seen today for follow-up.  She has a history of type 2 diabetes.  Her last hemoglobin A1c was in excess of 8, but she has enjoyed more recently, improved glycemic control.  She complains of hip and knee pain and has requested referral to physical therapy.  She is on diclofenac. She has a history of osteoarthritis. She also has treated hypertension. She is requesting also referral to a nutritionist.  Allergies: 1)  Amoxicillin (Amoxicillin)  Past History:  Past Medical History: Reviewed history from 10/27/2009 and no changes required. Hyperlipidemia Hypertension Hypothyroidism GERD Obesity DJD Low back pain Colonic polyps, hx of Diabetes mellitus, type II  Past Surgical History: Reviewed history from 08/04/2007 and no changes required. Hemorrhoidectomy Tonsillectomy sigmoidoscopy 1998, 2003 colonoscopy 2008  Family History: Reviewed history from 02/07/2008 and no changes required. father died age 20 complications of diabetes, coronary artery disease mother died age 4, renal failure  Three brothers, one died of renal cancer two sisters, one deceased from endometrial cancer  Social History: Reviewed history from 02/07/2008 and no changes required. Divorced Never Smoked  Review of Systems       The patient complains of difficulty walking.  The patient denies anorexia, fever, weight loss, weight gain, vision loss, decreased hearing, hoarseness, chest pain, syncope, dyspnea  on exertion, peripheral edema, prolonged cough, headaches, hemoptysis, abdominal pain, melena, hematochezia, severe indigestion/heartburn, hematuria, incontinence, genital sores, muscle weakness, suspicious skin lesions, transient blindness, depression, unusual weight change, abnormal bleeding, enlarged lymph nodes, angioedema, and breast masses.    Physical Exam  General:  overweight-appearing.  118/78overweight-appearing.   Head:  Normocephalic and atraumatic without obvious abnormalities. No apparent alopecia or balding. Eyes:  No corneal or conjunctival inflammation noted. EOMI. Perrla. Funduscopic exam benign, without hemorrhages, exudates or papilledema. Vision grossly normal. Mouth:  Oral mucosa and oropharynx without lesions or exudates.  Teeth in good repair. Neck:  No deformities, masses, or tenderness noted. Lungs:  Normal respiratory effort, chest expands symmetrically. Lungs are clear to auscultation, no crackles or wheezes. Heart:  Normal rate and regular rhythm. S1 and S2 normal without gallop, murmur, click, rub or other extra sounds. Abdomen:  Bowel sounds positive,abdomen soft and non-tender without masses, organomegaly or hernias noted. Msk:  slight tenderness involving the right lateral knee area.  No effusion, or excessive warmth Pulses:  R and L carotid,radial,femoral,dorsalis pedis and posterior tibial pulses are full and equal bilaterally Extremities:  No clubbing, cyanosis, edema, or deformity noted with normal full range of motion of all joints.     Impression & Recommendations:  Problem # 1:  DIABETES MELLITUS, TYPE II (ICD-250.00)  The following medications were removed from the medication list:    Benicar Hct 20-12.5 Mg Tabs (Olmesartan medoxomil-hctz) ..... Hold - to expensive - change rx Her updated medication list for this problem includes:    Metformin Hcl 500 Mg  Tabs (Metformin hcl) ..... Bid    Glimepiride 4 Mg Tabs (Glimepiride) .Marland Kitchen... 1/2 by mouth qday     Lisinopril-hydrochlorothiazide 20-12.5 Mg Tabs (Lisinopril-hydrochlorothiazide) ..... Qd  Orders: Capillary Blood Glucose/CBG (04540) Venipuncture (98119) Specimen Handling (14782) TLB-A1C / Hgb A1C (Glycohemoglobin) (83036-A1C) Nutrition Referral (Nutrition)  The following medications were removed from the medication list:    Benicar Hct 20-12.5 Mg Tabs (Olmesartan medoxomil-hctz) ..... Hold - to expensive - change rx Her updated medication list for this problem includes:    Metformin Hcl 500 Mg Tabs (Metformin hcl) ..... Bid    Glimepiride 4 Mg Tabs (Glimepiride) .Marland Kitchen... 1/2 by mouth qday    Lisinopril-hydrochlorothiazide 20-12.5 Mg Tabs (Lisinopril-hydrochlorothiazide) ..... Qd  Problem # 2:  KNEE PAIN, RIGHT (ICD-719.46)  Her updated medication list for this problem includes:    Diclofenac Potassium 50 Mg Tabs (Diclofenac potassium) ..... One tablet 3 times daily    Her updated medication list for this problem includes:    Diclofenac Potassium 50 Mg Tabs (Diclofenac potassium) ..... One tablet 3 times daily  Orders: Physical Therapy Referral (PT)  Problem # 3:  DEGENERATIVE JOINT DISEASE (ICD-715.90)  Her updated medication list for this problem includes:    Diclofenac Potassium 50 Mg Tabs (Diclofenac potassium) ..... One tablet 3 times daily  Her updated medication list for this problem includes:    Diclofenac Potassium 50 Mg Tabs (Diclofenac potassium) ..... One tablet 3 times daily  Problem # 4:  HYPERTENSION (ICD-401.9)  The following medications were removed from the medication list:    Benicar Hct 20-12.5 Mg Tabs (Olmesartan medoxomil-hctz) ..... Hold - to expensive - change rx Her updated medication list for this problem includes:    Lisinopril-hydrochlorothiazide 20-12.5 Mg Tabs (Lisinopril-hydrochlorothiazide) ..... Qd    Propranolol Hcl 20 Mg Tabs (Propranolol hcl) ..... One twice daiky as directed  The following medications were removed from the medication  list:    Benicar Hct 20-12.5 Mg Tabs (Olmesartan medoxomil-hctz) ..... Hold - to expensive - change rx Her updated medication list for this problem includes:    Lisinopril-hydrochlorothiazide 20-12.5 Mg Tabs (Lisinopril-hydrochlorothiazide) ..... Qd    Propranolol Hcl 20 Mg Tabs (Propranolol hcl) ..... One twice daiky as directed  Complete Medication List: 1)  Simvastatin 40 Mg Tabs (Simvastatin) .Marland Kitchen.. 1 once daily 2)  Synthroid 175 Mcg Tabs (Levothyroxine sodium) .Marland Kitchen.. 1 once daily 3)  Prilosec 20 Mg Cpdr (Omeprazole) .Marland Kitchen.. 1 once daily 4)  Metformin Hcl 500 Mg Tabs (Metformin hcl) .... Bid 5)  Accu-chek Aviva Strp (Glucose blood) .... Test blood sugar qd 6)  Accu-chek Soft Touch Lancets Misc (Lancets) .... Once daily and prn 7)  Diclofenac Potassium 50 Mg Tabs (Diclofenac potassium) .... One tablet 3 times daily 8)  Glimepiride 4 Mg Tabs (Glimepiride) .... 1/2 by mouth qday 9)  Lisinopril-hydrochlorothiazide 20-12.5 Mg Tabs (Lisinopril-hydrochlorothiazide) .... Qd 10)  Propranolol Hcl 20 Mg Tabs (Propranolol hcl) .... One twice daiky as directed 11)  Buspirone Hcl 15 Mg Tabs (Buspirone hcl) .... One twice daily as neeed  Patient Instructions: 1)  Please schedule a follow-up appointment in 3 months. 2)  Limit your Sodium (Salt) to less than 2 grams a day(slightly less than 1/2 a teaspoon) to prevent fluid retention, swelling, or worsening of symptoms. 3)  It is important that you exercise regularly at least 20 minutes 5 times a week. If you develop chest pain, have severe difficulty breathing, or feel very tired , stop exercising immediately and seek medical attention. 4)  You need  to lose weight. Consider a lower calorie diet and regular exercise.  5)  Check your blood sugars regularly. If your readings are usually above : or below 70 you should contact our office. 6)  It is important that your Diabetic A1c level is checked every 3 months. 7)  See your eye doctor yearly to check for diabetic  eye damage. Prescriptions: LISINOPRIL-HYDROCHLOROTHIAZIDE 20-12.5 MG TABS (LISINOPRIL-HYDROCHLOROTHIAZIDE) qd  #90 x 2   Entered and Authorized by:   Gordy Savers  MD   Signed by:   Gordy Savers  MD on 05/14/2010   Method used:   Electronically to        [facility]. [phone]* (retail)       [address]       [place], [place]  [address]       Ph: [phone]       Fax: [phone]   RxID:   [id number] GLIMEPIRIDE 4 MG TABS (GLIMEPIRIDE) 1/2 by mouth qday  #90 x 3   Entered and Authorized by:   Gordy Savers  MD   Signed by:   Gordy Savers  MD on 05/14/2010   Method used:   Electronically to        [facility]. [phone]* (retail)       [address]       [place], [place]  [address]       Ph: [phone]       Fax: [phone]   RxID:   [id number] DICLOFENAC POTASSIUM 50 MG TABS (DICLOFENAC POTASSIUM) one tablet 3 times daily  #180 x 5   Entered and Authorized by:   Gordy Savers  MD   Signed by:   Gordy Savers  MD on 05/14/2010   Method used:   Electronically to        [facility]. [phone]* (retail)       [address]       [place], [place]  [address]       Ph: [phone]       Fax: [phone]   RxID:   [id number] ACCU-CHEK SOFT TOUCH LANCETS  MISC (LANCETS) once daily and prn  #90 x 3   Entered and Authorized by:   Gordy Savers  MD   Signed by:   Gordy Savers  MD on 05/14/2010   Method used:   Electronically to        [facility] [phone]* (retail)       [address]       [place], [place]  [address]       Ph: [phone]       Fax: [phone]   RxID:   [id number] ACCU-CHEK AVIVA  STRP (GLUCOSE BLOOD) test blood sugar qd  #90 x 4   Entered and Authorized by:   Gordy Savers  MD   Signed by:   Gordy Savers  MD on 05/14/2010   Method used:   Electronically  to        [facility]. [phone]* (retail)       [address]       [place], [place]  [address]  Ph: [phone]       Fax: [phone]   RxID:   [id number] METFORMIN HCL 500 MG TABS (METFORMIN HCL) bid  #180 x 3   Entered and Authorized by:   Gordy Savers  MD   Signed by:   Gordy Savers  MD on 05/14/2010   Method used:   Electronically to        [facility]. [phone]* (retail)       [address]       [place], [place]  [address]       Ph: [phone]       Fax: [phone]   RxID:   [id number] PRILOSEC 20 MG  CPDR (OMEPRAZOLE) 1 once daily  #90 x 6   Entered and Authorized by:   Gordy Savers  MD   Signed by:   Gordy Savers  MD on 05/14/2010   Method used:   Electronically to        [facility]. [phone]* (retail)       [address]       [place], [place]  [address]       Ph: [phone]       Fax: [phone]   RxID:   [id number] SYNTHROID 175 MCG TABS (LEVOTHYROXINE SODIUM) 1 once daily  #90 Tablet x 2   Entered and Authorized by:   Gordy Savers  MD   Signed by:   Gordy Savers  MD on 05/14/2010   Method used:   Electronically to        [facility]. [phone]* (retail)       [address]       [place], [place]  [address]       Ph: [phone]       Fax: [phone]   RxID:   [id number] SIMVASTATIN 40 MG TABS (SIMVASTATIN) 1 once daily  #100 x 6   Entered and Authorized by:   Gordy Savers  MD   Signed by:   Gordy Savers  MD on 05/14/2010   Method used:   Electronically to        [facility]. [phone]* (retail)       [address]       [place], [place]  [address]       Ph: [phone]       Fax: [phone]   RxID:   [id number]    Orders Added: 1)  Capillary Blood Glucose/CBG [82948] 2)  Est. Patient Level IV [67341] 3)   Venipuncture [93790] 4)  Specimen Handling [99000] 5)  TLB-A1C / Hgb A1C (Glycohemoglobin) [83036-A1C] 6)  Physical Therapy Referral [PT] 7)  Nutrition Referral [Nutrition]

## 2010-05-21 NOTE — Consult Note (Signed)
Summary: [facility]  [facility]   Imported By: Maryln Gottron 09/10/2009 12:28:15  _____________________________________________________________________  External Attachment:    Type:   Image     Comment:   External Document

## 2010-05-21 NOTE — Progress Notes (Signed)
Summary: back pain  Phone Note Call from Patient   Caller: Patient Call For: Gordy Savers  MD Summary of Call: took call from pt - c/o shoulder and back pain - tylenol the only thing she is taking - what else to do?  Initial call taken by: Duard Brady LPN,  January 27, 2010 4:37 PM  Follow-up for Phone Call        instructed pt that it would be ok to take naproxen as listed on her med list - she also stated she had meloxicam - I told her ok to take but she could only use naproxen OR mobic , not both.  If no improvment by thursday - need to see KIK Follow-up by: Duard Brady LPN,  January 27, 2010 4:39 PM

## 2010-05-21 NOTE — Procedures (Signed)
Summary: Colonoscopy Report/[facility]  Colonoscopy Report/[facility]   Imported By: Maryln Gottron 03/09/2010 09:23:50  _____________________________________________________________________  External Attachment:    Type:   Image     Comment:   External Document

## 2010-05-21 NOTE — Assessment & Plan Note (Signed)
Summary: 3 month rov/njr   Vital Signs:  Patient profile:   70 year old female Weight:      217 pounds Temp:     98.1 degrees F oral BP sitting:   140 / 70  (left arm) Cuff size:   regular  Vitals Entered By: Kathrynn Speed CMA (October 27, 2009 1:36 PM) CC: 3 month rov, src Is Patient Diabetic? Yes CBG Result 112   CC:  3 month rov and src.  History of Present Illness:  70 year old patient who is seen today for follow-up of her type 2 diabetes.  Medical problems include obesity.  She remains on phentermine, which she continues to tolerate well.  She is treated hypothyroidism, gastroesophageal reflux disease, and remains on simvastatin 40 mg daily.  She is on metformin 500 mg twice daily.  She has been compliant with this medication.  Her last hemoglobin A1c3 months ago, was 8.2  Current Medications (verified): 1)  Phentermine Hcl 37.5 Mg Tabs (Phentermine Hcl) .Marland Kitchen.. 1 Once Daily As Needed 2)  Simvastatin 40 Mg Tabs (Simvastatin) .Marland Kitchen.. 1 Once Daily 3)  Synthroid 175 Mcg Tabs (Levothyroxine Sodium) .Marland Kitchen.. 1 Once Daily 4)  Benicar 20 Mg  Tabs (Olmesartan Medoxomil) .Marland Kitchen.. 1 Once Daily 5)  Prilosec 20 Mg  Cpdr (Omeprazole) .Marland Kitchen.. 1 Once Daily 6)  Naproxen 500 Mg Tabs (Naproxen) .... One Two Times A Day With Meals 7)  Metformin Hcl 500 Mg Tabs (Metformin Hcl) .... Bid 8)  Accu-Chek Aviva  Kit (Blood Glucose Monitoring Suppl) 9)  Accu-Chek Aviva  Strp (Glucose Blood) .... Test Blood Sugar Qd 10)  Accu-Chek Soft Touch Lancets  Misc (Lancets) .... Once Daily and Prn  Allergies (verified): 1)  Amoxicillin (Amoxicillin)  Past History:  Past Medical History: Hyperlipidemia Hypertension Hypothyroidism GERD Obesity DJD Low back pain Colonic polyps, hx of Diabetes mellitus, type II  Past Surgical History: Reviewed history from 08/04/2007 and no changes required. Hemorrhoidectomy Tonsillectomy sigmoidoscopy 1998, 2003 colonoscopy 2008  Review of Systems  The patient denies anorexia,  fever, weight loss, weight gain, vision loss, decreased hearing, hoarseness, chest pain, syncope, dyspnea on exertion, peripheral edema, prolonged cough, headaches, hemoptysis, abdominal pain, melena, hematochezia, severe indigestion/heartburn, hematuria, incontinence, genital sores, muscle weakness, suspicious skin lesions, transient blindness, difficulty walking, depression, unusual weight change, abnormal bleeding, enlarged lymph nodes, angioedema, and breast masses.    Physical Exam  General:  overweight-appearing.  normal blood pressure of 130/72overweight-appearing.   Head:  Normocephalic and atraumatic without obvious abnormalities. No apparent alopecia or balding. Eyes:  No corneal or conjunctival inflammation noted. EOMI. Perrla. Funduscopic exam benign, without hemorrhages, exudates or papilledema. Vision grossly normal. Mouth:  Oral mucosa and oropharynx without lesions or exudates.  Teeth in good repair. Neck:  No deformities, masses, or tenderness noted. Lungs:  Normal respiratory effort, chest expands symmetrically. Lungs are clear to auscultation, no crackles or wheezes. Heart:  Normal rate and regular rhythm. S1 and S2 normal without gallop, murmur, click, rub or other extra sounds. Abdomen:  Bowel sounds positive,abdomen soft and non-tender without masses, organomegaly or hernias noted. Msk:  No deformity or scoliosis noted of thoracic or lumbar spine.   Pulses:  R and L carotid,radial,femoral,dorsalis pedis and posterior tibial pulses are full and equal bilaterally Extremities:  No clubbing, cyanosis, edema, or deformity noted with normal full range of motion of all joints.     Impression & Recommendations:  Problem # 1:  DIABETES MELLITUS, TYPE II (ICD-250.00)  Her updated medication list for this problem  includes:    Benicar 20 Mg Tabs (Olmesartan medoxomil) .Marland Kitchen... 1 once daily    Metformin Hcl 500 Mg Tabs (Metformin hcl) ..... Bid  Orders: Capillary Blood Glucose/CBG  (13244)  Her updated medication list for this problem includes:    Benicar 20 Mg Tabs (Olmesartan medoxomil) .Marland Kitchen... 1 once daily    Metformin Hcl 500 Mg Tabs (Metformin hcl) ..... Bid  Problem # 2:  HYPOTHYROIDISM (ICD-244.9)  Her updated medication list for this problem includes:    Synthroid 175 Mcg Tabs (Levothyroxine sodium) .Marland Kitchen... 1 once daily  Her updated medication list for this problem includes:    Synthroid 175 Mcg Tabs (Levothyroxine sodium) .Marland Kitchen... 1 once daily  Problem # 3:  HYPERTENSION (ICD-401.9)  Her updated medication list for this problem includes:    Benicar 20 Mg Tabs (Olmesartan medoxomil) .Marland Kitchen... 1 once daily  Her updated medication list for this problem includes:    Benicar 20 Mg Tabs (Olmesartan medoxomil) .Marland Kitchen... 1 once daily  Problem # 4:  HYPERLIPIDEMIA (ICD-272.4)  Her updated medication list for this problem includes:    Simvastatin 40 Mg Tabs (Simvastatin) .Marland Kitchen... 1 once daily  Her updated medication list for this problem includes:    Simvastatin 40 Mg Tabs (Simvastatin) .Marland Kitchen... 1 once daily  Complete Medication List: 1)  Phentermine Hcl 37.5 Mg Tabs (Phentermine hcl) .Marland Kitchen.. 1 once daily as needed 2)  Simvastatin 40 Mg Tabs (Simvastatin) .Marland Kitchen.. 1 once daily 3)  Synthroid 175 Mcg Tabs (Levothyroxine sodium) .Marland Kitchen.. 1 once daily 4)  Benicar 20 Mg Tabs (Olmesartan medoxomil) .Marland Kitchen.. 1 once daily 5)  Prilosec 20 Mg Cpdr (Omeprazole) .Marland Kitchen.. 1 once daily 6)  Naproxen 500 Mg Tabs (Naproxen) .... One two times a day with meals 7)  Metformin Hcl 500 Mg Tabs (Metformin hcl) .... Bid 8)  Accu-chek Aviva Kit (Blood glucose monitoring suppl) 9)  Accu-chek Aviva Strp (Glucose blood) .... Test blood sugar qd 10)  Accu-chek Soft Touch Lancets Misc (Lancets) .... Once daily and prn  Other Orders: Venipuncture (01027) TLB-A1C / Hgb A1C (Glycohemoglobin) (83036-A1C)  Patient Instructions: 1)  Please schedule a follow-up appointment in 3 months. 2)  It is important that you exercise  regularly at least 20 minutes 5 times a week. If you develop chest pain, have severe difficulty breathing, or feel very tired , stop exercising immediately and seek medical attention. 3)  You need to lose weight. Consider a lower calorie diet and regular exercise.  4)  Check your blood sugars regularly. If your readings are usually above : or below 70 you should contact our office. 5)  It is important that your Diabetic A1c level is checked every 3 months. 6)  See your eye doctor yearly to check for diabetic eye damage.

## 2010-05-21 NOTE — Progress Notes (Signed)
Summary: Follow up referral  Phone Note Call from Patient   Caller: Patient Call For: Gordy Savers  MD Summary of Call: pt called about referral-- Initial call taken by: Duard Brady LPN,  July 03, 2009 3:55 PM  Follow-up for Phone Call        call to pt aware referral made, Terri will be calling Follow-up by: Duard Brady LPN,  July 03, 2009 3:55 PM

## 2010-05-21 NOTE — Assessment & Plan Note (Signed)
Summary: 3 month rov/njr   Vital Signs:  Patient profile:   70 year old female Weight:      218 pounds Temp:     97.8 degrees F oral BP sitting:   134 / 82  (left arm) Cuff size:   regular  Vitals Entered By: Duard Brady LPN (July 28, 2009 1:09 PM)  History of Present Illness: a 70 year old patient who is seen today for follow-up of her hypertension.  She has dyslipidemia, hypothyroidism, and impaired glucose tolerance.  Ankle plantar low back pain and arthritic complaints involving the small joints of the hands.  She is requesting a refill on her phentermine which has been temporally discontinued.  She has lost a modest amount of weight, but she feels that this has been a tremendous benefit.  She has no side effects and wishes this medication refilled  Allergies: 1)  Amoxicillin (Amoxicillin)  Past History:  Past Medical History: Reviewed history from 02/07/2008 and no changes required. Hyperlipidemia Hypertension Hypothyroidism GERD Obesity DJD Low back pain Colonic polyps, hx of  Past Surgical History: Reviewed history from 08/04/2007 and no changes required. Hemorrhoidectomy Tonsillectomy sigmoidoscopy 1998, 2003 colonoscopy 2008  Review of Systems       The patient complains of weight loss and difficulty walking.  The patient denies anorexia, fever, weight gain, vision loss, decreased hearing, hoarseness, chest pain, syncope, dyspnea on exertion, peripheral edema, prolonged cough, headaches, hemoptysis, abdominal pain, melena, hematochezia, severe indigestion/heartburn, hematuria, incontinence, genital sores, muscle weakness, suspicious skin lesions, transient blindness, depression, unusual weight change, abnormal bleeding, enlarged lymph nodes, angioedema, and breast masses.          requesting handicap placard renewal  Physical Exam  General:  overweight-appearing. 120/84 Head:  Normocephalic and atraumatic without obvious abnormalities. No apparent  alopecia or balding. Mouth:  Oral mucosa and oropharynx without lesions or exudates.  Teeth in good repair. Neck:  No deformities, masses, or tenderness noted. Lungs:  Normal respiratory effort, chest expands symmetrically. Lungs are clear to auscultation, no crackles or wheezes. Heart:  Normal rate and regular rhythm. S1 and S2 normal without gallop, murmur, click, rub or other extra sounds. Abdomen:  Bowel sounds positive,abdomen soft and non-tender without masses, organomegaly or hernias noted. Msk:  No deformity or scoliosis noted of thoracic or lumbar spine.   Pulses:  R and L carotid,radial,femoral,dorsalis pedis and posterior tibial pulses are full and equal bilaterally Extremities:  No clubbing, cyanosis, edema, or deformity noted with normal full range of motion of all joints.     Impression & Recommendations:  Problem # 1:  IMPAIRED GLUCOSE TOLERANCE (ICD-271.3)  will check a hemoglobin A1c  Orders: Venipuncture (16109) TLB-A1C / Hgb A1C (Glycohemoglobin) (83036-A1C)  Problem # 2:  LOW BACK PAIN (ICD-724.2)  The following medications were removed from the medication list:    Tramadol Hcl 50 Mg Tabs (Tramadol hcl) ..... One by mouth q 6 hrs as needed pain    Naproxen Dr 500 Mg Tbec (Naproxen) ..... One twice daily    Meloxicam 15 Mg Tabs (Meloxicam) ..... One daily Her updated medication list for this problem includes:    Naproxen 500 Mg Tabs (Naproxen) ..... One two times a day with meals  The following medications were removed from the medication list:    Tramadol Hcl 50 Mg Tabs (Tramadol hcl) ..... One by mouth q 6 hrs as needed pain    Naproxen Dr 500 Mg Tbec (Naproxen) ..... One twice daily    Meloxicam 15 Mg Tabs (  Meloxicam) ..... One daily Her updated medication list for this problem includes:    Naproxen 500 Mg Tabs (Naproxen) ..... One two times a day with meals  Problem # 3:  DEGENERATIVE JOINT DISEASE (ICD-715.90)  The following medications were removed from  the medication list:    Tramadol Hcl 50 Mg Tabs (Tramadol hcl) ..... One by mouth q 6 hrs as needed pain    Naproxen Dr 500 Mg Tbec (Naproxen) ..... One twice daily    Meloxicam 15 Mg Tabs (Meloxicam) ..... One daily Her updated medication list for this problem includes:    Naproxen 500 Mg Tabs (Naproxen) ..... One two times a day with meals  The following medications were removed from the medication list:    Tramadol Hcl 50 Mg Tabs (Tramadol hcl) ..... One by mouth q 6 hrs as needed pain    Naproxen Dr 500 Mg Tbec (Naproxen) ..... One twice daily    Meloxicam 15 Mg Tabs (Meloxicam) ..... One daily Her updated medication list for this problem includes:    Naproxen 500 Mg Tabs (Naproxen) ..... One two times a day with meals  Problem # 4:  HYPERTENSION (ICD-401.9)  Her updated medication list for this problem includes:    Benicar 20 Mg Tabs (Olmesartan medoxomil) .Marland Kitchen... 1 once daily  Her updated medication list for this problem includes:    Benicar 20 Mg Tabs (Olmesartan medoxomil) .Marland Kitchen... 1 once daily  Complete Medication List: 1)  Phentermine Hcl 37.5 Mg Tabs (Phentermine hcl) .Marland Kitchen.. 1 once daily as needed 2)  Simvastatin 40 Mg Tabs (Simvastatin) .Marland Kitchen.. 1 once daily 3)  Synthroid 175 Mcg Tabs (Levothyroxine sodium) .Marland Kitchen.. 1 once daily 4)  Benicar 20 Mg Tabs (Olmesartan medoxomil) .Marland Kitchen.. 1 once daily 5)  Prilosec 20 Mg Cpdr (Omeprazole) .Marland Kitchen.. 1 once daily 6)  Naproxen 500 Mg Tabs (Naproxen) .... One two times a day with meals  Patient Instructions: 1)  Please schedule a follow-up appointment in 3 months. 2)  Limit your Sodium (Salt) to less than 2 grams a day(slightly less than 1/2 a teaspoon) to prevent fluid retention, swelling, or worsening of symptoms. 3)  It is important that you exercise regularly at least 20 minutes 5 times a week. If you develop chest pain, have severe difficulty breathing, or feel very tired , stop exercising immediately and seek medical attention. 4)  You need to lose  weight. Consider a lower calorie diet and regular exercise.  Prescriptions: PHENTERMINE HCL 37.5 MG TABS (PHENTERMINE HCL) 1 once daily as needed  #90 x 2   Entered and Authorized by:   Gordy Savers  MD   Signed by:   Gordy Savers  MD on 07/28/2009   Method used:   Print then Give to Patient   RxID:   [id number]

## 2010-05-21 NOTE — Progress Notes (Signed)
  Phone Note Call from Patient   Caller: Patient Call For: Gordy Savers  MD Summary of Call: To save pt money, she can cut the Metformin in half.   The Benicar is too expensive, but wants to change this med., and did not pick it up.. Initial call taken by: Lynann Beaver CMA AAMA,  May 08, 2010 2:40 PM  Follow-up for Phone Call        change benicar  to lisinopril hct 20-12.5  #90 one daily  Follow-up by: Gordy Savers  MD,  May 08, 2010 5:05 PM  Additional Follow-up for Phone Call Additional follow up Details #1::        pt aware - , change to med list , new rx [facility] to [facility]. will discuss at rov next week KIK Additional Follow-up by: Duard Brady LPN,  May 08, 2010 5:22 PM    New/Updated Medications: BENICAR HCT 20-12.5 MG TABS (OLMESARTAN MEDOXOMIL-HCTZ) hold - to expensive - change rx LISINOPRIL-HYDROCHLOROTHIAZIDE 20-12.5 MG TABS (LISINOPRIL-HYDROCHLOROTHIAZIDE) qd Prescriptions: LISINOPRIL-HYDROCHLOROTHIAZIDE 20-12.5 MG TABS (LISINOPRIL-HYDROCHLOROTHIAZIDE) qd  #90 x 2   Entered by:   Duard Brady LPN   Authorized by:   Gordy Savers  MD   Signed by:   Duard Brady LPN on 16/01/9603   Method used:   Electronically to        [facility]. [phone]* (retail)       [address]       [place], [place]  [address]       Ph: [phone]       Fax: [phone]   RxID:   [id number]

## 2010-06-10 ENCOUNTER — Encounter: Payer: Medicare Other | Attending: Internal Medicine | Admitting: *Deleted

## 2010-06-10 DIAGNOSIS — E119 Type 2 diabetes mellitus without complications: Secondary | ICD-10-CM | POA: Insufficient documentation

## 2010-06-10 DIAGNOSIS — Z713 Dietary counseling and surveillance: Secondary | ICD-10-CM | POA: Insufficient documentation

## 2010-07-09 ENCOUNTER — Encounter: Payer: Medicare Other | Attending: Internal Medicine | Admitting: *Deleted

## 2010-07-09 DIAGNOSIS — Z713 Dietary counseling and surveillance: Secondary | ICD-10-CM | POA: Insufficient documentation

## 2010-07-09 DIAGNOSIS — E119 Type 2 diabetes mellitus without complications: Secondary | ICD-10-CM | POA: Insufficient documentation

## 2010-07-21 ENCOUNTER — Telehealth: Payer: Self-pay

## 2010-07-21 NOTE — Telephone Encounter (Signed)
Got fax from [facility] requesting #180 on metformin to be disp.   Called [facility] - spoke with bobby - he shows rx efilled 05/14/10 #180 3RF in their computer.  I will disregurd fax. KIK

## 2010-08-12 ENCOUNTER — Encounter: Payer: Self-pay | Admitting: Internal Medicine

## 2010-08-12 ENCOUNTER — Encounter: Payer: Medicare Other | Attending: Internal Medicine | Admitting: *Deleted

## 2010-08-12 DIAGNOSIS — Z713 Dietary counseling and surveillance: Secondary | ICD-10-CM | POA: Insufficient documentation

## 2010-08-12 DIAGNOSIS — E119 Type 2 diabetes mellitus without complications: Secondary | ICD-10-CM | POA: Insufficient documentation

## 2010-08-13 ENCOUNTER — Ambulatory Visit (INDEPENDENT_AMBULATORY_CARE_PROVIDER_SITE_OTHER): Payer: Medicare Other | Admitting: Internal Medicine

## 2010-08-13 ENCOUNTER — Encounter: Payer: Self-pay | Admitting: Internal Medicine

## 2010-08-13 DIAGNOSIS — F411 Generalized anxiety disorder: Secondary | ICD-10-CM

## 2010-08-13 DIAGNOSIS — E119 Type 2 diabetes mellitus without complications: Secondary | ICD-10-CM

## 2010-08-13 DIAGNOSIS — F419 Anxiety disorder, unspecified: Secondary | ICD-10-CM

## 2010-08-13 DIAGNOSIS — I10 Essential (primary) hypertension: Secondary | ICD-10-CM

## 2010-08-13 LAB — HEMOGLOBIN A1C: Hgb A1c MFr Bld: 7.4 % — ABNORMAL HIGH (ref 4.6–6.5)

## 2010-08-13 MED ORDER — SERTRALINE HCL 50 MG PO TABS: 50.0000 mg | ORAL_TABLET | Freq: Every day | ORAL | Status: DC

## 2010-08-13 NOTE — Progress Notes (Signed)
  Subjective:    Patient ID: Leslie Benton, female    DOB: [birth year], 70 y.o.   MRN: [id number]  HPI  69 year old patient who is seen today for followup. She has a history of type 2 diabetes. She has a history of anxiety and states that she overeats at night due to anxiety related factors. She is being followed both by a behavioral specialist as well as a dietitian. She also has been exercising regularly and has a Systems analyst. Glycemic control has improved. Her hemoglobin A1c was 7.53 months ago she has a history of treated hypothyroidism dyslipidemia and hypertension. An antidepressant was recommended. She remains on simvastatin which he tolerates well she is exercising regularly   Review of Systems  Constitutional: Negative.   HENT: Negative for hearing loss, congestion, sore throat, rhinorrhea, dental problem, sinus pressure and tinnitus.   Eyes: Negative for pain, discharge and visual disturbance.  Respiratory: Negative for cough and shortness of breath.   Cardiovascular: Negative for chest pain, palpitations and leg swelling.  Gastrointestinal: Negative for nausea, vomiting, abdominal pain, diarrhea, constipation, blood in stool and abdominal distention.  Genitourinary: Negative for dysuria, urgency, frequency, hematuria, flank pain, vaginal bleeding, vaginal discharge, difficulty urinating, vaginal pain and pelvic pain.  Musculoskeletal: Negative for joint swelling, arthralgias and gait problem.  Skin: Negative for rash.  Neurological: Negative for dizziness, syncope, speech difficulty, weakness, numbness and headaches.  Hematological: Negative for adenopathy.  Psychiatric/Behavioral: Positive for dysphoric mood. Negative for behavioral problems and agitation. The patient is nervous/anxious.        Objective:   Physical Exam  Constitutional: She is oriented to person, place, and time. She appears well-developed and well-nourished.       Overweight mildly anxious no distress.  Blood pressure 140/70  HENT:  Head: Normocephalic.  Right Ear: External ear normal.  Left Ear: External ear normal.  Mouth/Throat: Oropharynx is clear and moist.  Eyes: Conjunctivae and EOM are normal. Pupils are equal, round, and reactive to light.  Neck: Normal range of motion. Neck supple. No thyromegaly present.  Cardiovascular: Normal rate, regular rhythm, normal heart sounds and intact distal pulses.   Pulmonary/Chest: Effort normal and breath sounds normal.  Abdominal: Soft. Bowel sounds are normal. She exhibits no mass. There is no tenderness.  Musculoskeletal: Normal range of motion.  Lymphadenopathy:    She has no cervical adenopathy.  Neurological: She is alert and oriented to person, place, and time.  Skin: Skin is warm and dry. No rash noted.  Psychiatric: She has a normal mood and affect. Her behavior is normal.          Assessment & Plan:   Anxiety disorder. We'll start on sertraline 50 mg daily and clinically observed. Diabetes mellitus. We'll check a hemoglobin A1c. We'll consider uptitrating metformin if hemoglobin A1c is greater than 7 Hypertension well controlled Dyslipidemia stable

## 2010-08-13 NOTE — Patient Instructions (Signed)
It is important that you exercise regularly, at least 20 minutes 3 to 4 times per week.  If you develop chest pain or shortness of breath seek  medical attention.  You need to lose weight.  Consider a lower calorie diet and regular exercise.   Please check your hemoglobin A1c every 3 months  Return in 6 weeks for followup

## 2010-08-25 ENCOUNTER — Other Ambulatory Visit: Payer: Self-pay

## 2010-08-25 MED ORDER — METFORMIN HCL 1000 MG PO TABS: 1000.0000 mg | ORAL_TABLET | Freq: Two times a day (BID) | ORAL | Status: DC

## 2010-08-25 NOTE — Telephone Encounter (Signed)
[facility] called requesting metformin 1000mg  BID , pt indicated this is what she has been on for quite sometime.  Looking back in chart - med was changed to this 10/2009 due to lab.  New rx sent to pharmacy. KIK

## 2010-09-01 NOTE — Assessment & Plan Note (Signed)
[facility]                            [facility] OFFICE NOTE   Leslie Benton, Leslie Benton                        MRN:          [id number]  DATE:10/12/2006                            DOB:          1940/11/11    A 70 year old female seen today for an annual exam.  She has  hypertension, DJD with some chronic low back pain.  She has  hypothyroidism, dyslipidemia, exogenous obesity, and gastroesophageal  reflux disease.  She has recently seen [facility] and is scheduled for a  colonoscopy next month.  She is gravida 2, para 2, abortus 0.  She has  had a prior hemorrhoidectomy, tonsillectomy at age 10.  She has a  PENICILLIN allergy.   MEDICAL REGIMEN:  1. Benazepril hydrochlorothiazide.  2. Lipitor 20.  3. Synthroid 0.175.  4. Prilosec over-the-counter.  P.r.n. medications include:  1. BuSpar.  2. Phentermine.  3. Propranolol.   FAMILY HISTORY:  Father died at 23 of complications of diabetes and  coronary artery disease.  Mother died at 62 of renal failure.  Three  brothers, 2 sisters.  Positive for adrenal and endometrial cancer.  She  is followed annually by her gynecologist.  Is scheduled for a mammogram  in August or September.   EXAM:  An obese white female with a weight of 213, blood pressure  130/80.  SKIN:  Negative.  FUNDI, EARS, NOSE, AND THROAT:  Clear.  NECK:  No bruits.  CHEST:  Clear.  BREASTS:  Negative.  CARDIOVASCULAR:  Normal S1, S2.  No murmurs.  ABDOMEN:  Obese, soft, and nontender.  No organomegaly.  PELVIC:  Deferred.  RECTAL:  Deferred.  EXTREMITIES:  Reveals full peripheral pulses, except for an absent left  dorsalis pedis pulse.  There was some bony deformity involving the  dorsum of the left foot from a prior fracture.   IMPRESSION:  1. Hypertension.  2. Dyslipidemia.  3. Obesity.  4. Hypothyroidism.  5. Gastroesophageal reflux disease.   DISPOSITION:  She will follow up next month for her colonoscopy.  Weight  loss  encouraged.  Continue her present regimen.  Laboratory will be  reviewed.  Recheck in 6 months.     Gordy Savers, MD  Electronically Signed    PFK/MedQ  DD: 10/12/2006  DT: 10/12/2006  Job #: [id number]

## 2010-09-01 NOTE — Assessment & Plan Note (Signed)
[facility]                            [facility] OFFICE NOTE   JACQUELYNN, FRIEND                        MRN:          [id number]  DATE:10/12/2006                            DOB:          [birth year]      Gordy Savers, MD  Electronically Signed    PFK/MedQ  DD: 10/12/2006  DT: 10/12/2006  Job #: [id number]

## 2010-09-09 ENCOUNTER — Encounter: Payer: Medicare Other | Attending: Internal Medicine | Admitting: *Deleted

## 2010-09-09 DIAGNOSIS — E119 Type 2 diabetes mellitus without complications: Secondary | ICD-10-CM | POA: Insufficient documentation

## 2010-09-09 DIAGNOSIS — Z713 Dietary counseling and surveillance: Secondary | ICD-10-CM | POA: Insufficient documentation

## 2010-09-24 ENCOUNTER — Encounter: Payer: Self-pay | Admitting: Internal Medicine

## 2010-09-24 ENCOUNTER — Ambulatory Visit (INDEPENDENT_AMBULATORY_CARE_PROVIDER_SITE_OTHER): Payer: Medicare Other | Admitting: Internal Medicine

## 2010-09-24 DIAGNOSIS — E119 Type 2 diabetes mellitus without complications: Secondary | ICD-10-CM

## 2010-09-24 DIAGNOSIS — I10 Essential (primary) hypertension: Secondary | ICD-10-CM

## 2010-09-24 MED ORDER — PHENTERMINE HCL 37.5 MG PO CAPS: 37.5000 mg | ORAL_CAPSULE | ORAL | Status: DC

## 2010-09-24 NOTE — Progress Notes (Signed)
  Subjective:    Patient ID: Leslie Benton, female    DOB: [birth year], 70 y.o.   MRN: [id number]  HPI   Wt Readings from Last 3 Encounters:  09/24/10 204 lb (92.534 kg)  08/13/10 213 lb (96.616 kg)  05/14/10 217 lb (98.431 kg)      Review of Systems     Objective:   Physical Exam        Assessment & Plan:

## 2010-09-24 NOTE — Progress Notes (Signed)
  Subjective:    Patient ID: Leslie Benton, female    DOB: 07/04/1940, 70 y.o.   MRN: [id number]  HPI  70 year old patient who is seen today for followup of her anxiety. She was placed on sertraline last visit and has done well on this medication she also takes BuSpar once or twice daily. She has a history of type 2 diabetes in his following a much better diet and exercising regularly. She is maintained much better glycemic control and has lost 9 pounds since her last visit here. She has treated hypertension    Review of Systems  Constitutional: Negative.   HENT: Negative for hearing loss, congestion, sore throat, rhinorrhea, dental problem, sinus pressure and tinnitus.   Eyes: Negative for pain, discharge and visual disturbance.  Respiratory: Negative for cough and shortness of breath.   Cardiovascular: Negative for chest pain, palpitations and leg swelling.  Gastrointestinal: Negative for nausea, vomiting, abdominal pain, diarrhea, constipation, blood in stool and abdominal distention.  Genitourinary: Negative for dysuria, urgency, frequency, hematuria, flank pain, vaginal bleeding, vaginal discharge, difficulty urinating, vaginal pain and pelvic pain.  Musculoskeletal: Negative for joint swelling, arthralgias and gait problem.  Skin: Negative for rash.  Neurological: Negative for dizziness, syncope, speech difficulty, weakness, numbness and headaches.  Hematological: Negative for adenopathy.  Psychiatric/Behavioral: Negative for behavioral problems, dysphoric mood and agitation. The patient is not nervous/anxious.        Objective:   Physical Exam  Constitutional: She is oriented to person, place, and time. She appears well-developed and well-nourished.  HENT:  Head: Normocephalic.  Right Ear: External ear normal.  Left Ear: External ear normal.  Mouth/Throat: Oropharynx is clear and moist.  Eyes: Conjunctivae and EOM are normal. Pupils are equal, round, and reactive to light.    Neck: Normal range of motion. Neck supple. No thyromegaly present.  Cardiovascular: Normal rate, regular rhythm, normal heart sounds and intact distal pulses.   Pulmonary/Chest: Effort normal and breath sounds normal.  Abdominal: Soft. Bowel sounds are normal. She exhibits no mass. There is no tenderness.  Musculoskeletal: Normal range of motion.  Lymphadenopathy:    She has no cervical adenopathy.  Neurological: She is alert and oriented to person, place, and time.  Skin: Skin is warm and dry. No rash noted.  Psychiatric: She has a normal mood and affect. Her behavior is normal.          Assessment & Plan:    Diabetes mellitus type 2. Will check hemoglobin A1c next visit we'll schedule a CPX Hypertension stable Anxiety disorder improved. We'll continue sertraline.

## 2010-09-24 NOTE — Patient Instructions (Signed)
Please check your hemoglobin A1c every 3 months    It is important that you exercise regularly, at least 20 minutes 3 to 4 times per week.  If you develop chest pain or shortness of breath seek  medical attention.  You need to lose weight.  Consider a lower calorie diet and regular exercise. 

## 2010-09-25 ENCOUNTER — Other Ambulatory Visit: Payer: Self-pay | Admitting: Internal Medicine

## 2010-09-29 ENCOUNTER — Other Ambulatory Visit: Payer: Self-pay

## 2010-09-29 MED ORDER — SERTRALINE HCL 50 MG PO TABS: 50.0000 mg | ORAL_TABLET | Freq: Every day | ORAL | Status: DC

## 2010-09-29 NOTE — Telephone Encounter (Signed)
Request from pharmacy - [facility] for 30 day with 11 refills   efilled 6mos kik

## 2010-10-07 ENCOUNTER — Encounter: Payer: Medicare Other | Attending: Internal Medicine | Admitting: *Deleted

## 2010-10-07 DIAGNOSIS — E119 Type 2 diabetes mellitus without complications: Secondary | ICD-10-CM | POA: Insufficient documentation

## 2010-10-07 DIAGNOSIS — Z713 Dietary counseling and surveillance: Secondary | ICD-10-CM | POA: Insufficient documentation

## 2010-11-03 ENCOUNTER — Telehealth: Payer: Self-pay

## 2010-11-03 MED ORDER — SERTRALINE HCL 50 MG PO TABS: 50.0000 mg | ORAL_TABLET | Freq: Every day | ORAL | Status: DC

## 2010-11-03 NOTE — Telephone Encounter (Signed)
Pt requested 90 rx on zoloft - new rx efiled to [facility]. kik

## 2010-11-04 ENCOUNTER — Encounter: Payer: Self-pay | Admitting: *Deleted

## 2010-11-04 ENCOUNTER — Encounter: Payer: Medicare Other | Attending: Internal Medicine | Admitting: *Deleted

## 2010-11-04 DIAGNOSIS — E119 Type 2 diabetes mellitus without complications: Secondary | ICD-10-CM | POA: Insufficient documentation

## 2010-11-04 DIAGNOSIS — Z713 Dietary counseling and surveillance: Secondary | ICD-10-CM | POA: Insufficient documentation

## 2010-11-04 NOTE — Patient Instructions (Addendum)
Goals:  Eat 5-6 small meals every day and avoid meal skipping.   Increase protein-rich foods.  Follow "Plate Method" for portion control.  Limit carbohydrate to 20-25 grams per meal and 2000 mg or less daily.    Choose more whole grains, lean protein, low-fat dairy, and fruits/non-starchy vegetables.   Aim for 20-30 min of physical activity daily.  Limit sugar-sweetened beverages and concentrated sweets.  Resume checking blood sugars as ordered by doctor.  Call insurance company regarding benefits.

## 2010-11-04 NOTE — Progress Notes (Signed)
  Medical Nutrition Therapy:  Appt start time:  3:00p  end time:  3:30p.  Assessment:  Primary concerns today: T2DM Follow up visit. Pt reports she is doing well and in good mood.  Has lost 2 lbs since last visit.  Tired of chicken and having hard time with red meat, but eating salmon.  States she is craving white rice and has made some progress with dietary changes (i.e. Egg beaters, etc).  Still consuming increased sodium through canned soups and adds "lite" salt to some foods.  Stopped exercising with trainer d/t financial reasons, but goes on her own 2 days/wk.     MEDICATIONS: Adipex, metformin, glimepiride, prilosec, simvastatin, levothyroxine  DIETARY INTAKE:  Unable to obtain dietary recall, however pt eating egg salad, canned soup, egg beaters, and salmon.  Continues to skip meals.  Recent physical activity: swimming 3-4 days/week; no longer working with trainer d/t financial reasons, but goes 2x/wk on own (strength and cardio)  Estimated needs: 1200-1300 calories 135-145 g carbohydrates 75-80 g protein 40-45 g fat  Progress Towards Goal(s):  Some progress.   Nutritional Diagnosis:  [place]-2.1 Inpaired nutrition utilization related to glucose as evidenced by most recent A1c of 7.4% and diagnosis of T2DM.    Intervention/Goals:  Eat 5-6 small meals every day and avoid meal skipping.   Increase protein-rich foods.  Follow "Plate Method" for portion control.  Limit carbohydrate to 20-25 grams per meal and 2000 mg or less daily.    Choose more whole grains, lean protein, low-fat dairy, and fruits/non-starchy vegetables.   Aim for 20-30 min of physical activity daily.  Limit sugar-sweetened beverages and concentrated sweets.  Resume checking blood sugars as ordered by doctor.  Monitoring/Evaluation:  Dietary intake, exercise, A1c, and body weight in 4 week(s).

## 2010-11-05 ENCOUNTER — Telehealth: Payer: Self-pay | Admitting: Internal Medicine

## 2010-11-05 MED ORDER — BUSPIRONE HCL 15 MG PO TABS: 15.0000 mg | ORAL_TABLET | Freq: Two times a day (BID) | ORAL | Status: DC | PRN

## 2010-11-05 NOTE — Telephone Encounter (Signed)
[facility] stated that patient is requesting buspar 15mg . [facility] needs prescription.

## 2010-11-05 NOTE — Telephone Encounter (Signed)
done

## 2010-12-02 ENCOUNTER — Encounter: Payer: Medicare Other | Attending: Internal Medicine | Admitting: *Deleted

## 2010-12-02 DIAGNOSIS — E119 Type 2 diabetes mellitus without complications: Secondary | ICD-10-CM | POA: Insufficient documentation

## 2010-12-02 DIAGNOSIS — Z713 Dietary counseling and surveillance: Secondary | ICD-10-CM | POA: Insufficient documentation
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
## 2010-12-02 NOTE — Patient Instructions (Addendum)
Goals:  (Continue previous goals - listed below) Eat 5-6 small meals every day and avoid meal skipping.  Increase protein-rich foods.  Follow "Plate Method" for portion control.  Limit carbohydrate to 20-25 grams per meal and 2000 mg or less daily.  Choose more whole grains, lean protein, low-fat dairy, and fruits/non-starchy vegetables.  Aim for 20-30 min of physical activity daily.  Limit sugar-sweetened beverages and concentrated sweets. Resume checking blood sugars as ordered by doctor.

## 2010-12-28 ENCOUNTER — Encounter: Payer: Self-pay | Admitting: Internal Medicine

## 2010-12-28 ENCOUNTER — Telehealth: Payer: Self-pay

## 2010-12-28 ENCOUNTER — Ambulatory Visit (INDEPENDENT_AMBULATORY_CARE_PROVIDER_SITE_OTHER): Payer: Medicare Other | Admitting: Internal Medicine

## 2010-12-28 VITALS — BP 120/70 | HR 70 | Temp 98.2°F | Resp 16 | Ht 62.5 in | Wt 200.0 lb

## 2010-12-28 DIAGNOSIS — E119 Type 2 diabetes mellitus without complications: Secondary | ICD-10-CM

## 2010-12-28 DIAGNOSIS — M1990 Unspecified osteoarthritis, unspecified site: Secondary | ICD-10-CM

## 2010-12-28 DIAGNOSIS — I10 Essential (primary) hypertension: Secondary | ICD-10-CM

## 2010-12-28 DIAGNOSIS — Z86010 Personal history of colonic polyps: Secondary | ICD-10-CM

## 2010-12-28 DIAGNOSIS — E785 Hyperlipidemia, unspecified: Secondary | ICD-10-CM

## 2010-12-28 DIAGNOSIS — Z23 Encounter for immunization: Secondary | ICD-10-CM

## 2010-12-28 DIAGNOSIS — E039 Hypothyroidism, unspecified: Secondary | ICD-10-CM

## 2010-12-28 DIAGNOSIS — Z0000 Encounter for general adult medical examination without abnormal findings: Secondary | ICD-10-CM

## 2010-12-28 DIAGNOSIS — Z2911 Encounter for prophylactic immunotherapy for respiratory syncytial virus (RSV): Secondary | ICD-10-CM

## 2010-12-28 LAB — CBC WITH DIFFERENTIAL/PLATELET
Eosinophils Relative: 1.5 % (ref 0.0–5.0)
HCT: 42 % (ref 36.0–46.0)
Hemoglobin: 13.7 g/dL (ref 12.0–15.0)
Lymphs Abs: 2.9 10*3/uL (ref 0.7–4.0)
MCV: 92 fl (ref 78.0–100.0)
Monocytes Absolute: 0.5 10*3/uL (ref 0.1–1.0)
Neutro Abs: 4.3 10*3/uL (ref 1.4–7.7)
Platelets: 307 10*3/uL (ref 150.0–400.0)
WBC: 7.9 10*3/uL (ref 4.5–10.5)

## 2010-12-28 LAB — BASIC METABOLIC PANEL
BUN: 21 mg/dL (ref 6–23)
Chloride: 106 mEq/L (ref 96–112)
Glucose, Bld: 132 mg/dL — ABNORMAL HIGH (ref 70–99)
Potassium: 4.2 mEq/L (ref 3.5–5.1)

## 2010-12-28 LAB — HEPATIC FUNCTION PANEL
ALT: 19 U/L (ref 0–35)
AST: 16 U/L (ref 0–37)
Albumin: 3.9 g/dL (ref 3.5–5.2)

## 2010-12-28 LAB — LIPID PANEL
Cholesterol: 187 mg/dL (ref 0–200)
LDL Cholesterol: 104 mg/dL — ABNORMAL HIGH (ref 0–99)

## 2010-12-28 LAB — TSH: TSH: 0.15 u[IU]/mL — ABNORMAL LOW (ref 0.35–5.50)

## 2010-12-28 MED ORDER — ACCU-CHEK SOFT TOUCH LANCETS MISC: Status: DC

## 2010-12-28 MED ORDER — DICLOFENAC SODIUM 50 MG PO TBEC: 50.0000 mg | DELAYED_RELEASE_TABLET | Freq: Three times a day (TID) | ORAL | Status: DC

## 2010-12-28 MED ORDER — PHENTERMINE HCL 37.5 MG PO TABS: 37.5000 mg | ORAL_TABLET | Freq: Every day | ORAL | Status: DC

## 2010-12-28 MED ORDER — SERTRALINE HCL 50 MG PO TABS: 50.0000 mg | ORAL_TABLET | Freq: Every day | ORAL | Status: DC

## 2010-12-28 MED ORDER — METFORMIN HCL 1000 MG PO TABS: 1000.0000 mg | ORAL_TABLET | Freq: Two times a day (BID) | ORAL | Status: DC

## 2010-12-28 MED ORDER — SIMVASTATIN 40 MG PO TABS: 40.0000 mg | ORAL_TABLET | Freq: Every day | ORAL | Status: DC

## 2010-12-28 MED ORDER — GLUCOSE BLOOD VI STRP: ORAL_STRIP | Status: DC

## 2010-12-28 MED ORDER — BUSPIRONE HCL 15 MG PO TABS: 15.0000 mg | ORAL_TABLET | Freq: Two times a day (BID) | ORAL | Status: DC | PRN

## 2010-12-28 MED ORDER — LISINOPRIL-HYDROCHLOROTHIAZIDE 20-12.5 MG PO TABS: 1.0000 | ORAL_TABLET | Freq: Every day | ORAL | Status: DC

## 2010-12-28 MED ORDER — LEVOTHYROXINE SODIUM 175 MCG PO TABS: 175.0000 ug | ORAL_TABLET | Freq: Every day | ORAL | Status: DC

## 2010-12-28 MED ORDER — GLIMEPIRIDE 4 MG PO TABS: 2.0000 mg | ORAL_TABLET | Freq: Every day | ORAL | Status: DC

## 2010-12-28 MED ORDER — OMEPRAZOLE 20 MG PO CPDR: 20.0000 mg | DELAYED_RELEASE_CAPSULE | Freq: Every day | ORAL | Status: DC

## 2010-12-28 MED ORDER — PROPRANOLOL HCL 20 MG PO TABS: 20.0000 mg | ORAL_TABLET | Freq: Two times a day (BID) | ORAL | Status: DC

## 2010-12-28 NOTE — Progress Notes (Signed)
Addended by: Duard Brady I on: 12/28/2010 10:01 AM   Modules accepted: Orders

## 2010-12-28 NOTE — Patient Instructions (Signed)
Limit your sodium (Salt) intake  Please see your eye doctor yearly to check for diabetic eye damage   Please check your hemoglobin A1c every 3 months    It is important that you exercise regularly, at least 20 minutes 3 to 4 times per week.  If you develop chest pain or shortness of breath seek  medical attention.  You need to lose weight.  Consider a lower calorie diet and regular exercise.

## 2010-12-28 NOTE — Progress Notes (Signed)
Subjective:    Patient ID: Leslie Benton, female    DOB: August 02, 1940, 70 y.o.   MRN: [id number]  HPI  70 year old patient  who is seen today for an annual exam. She has type 2 diabetes hypertension and a history colonic polyps. She has exogenous obesity and continues her efforts at weight loss. She is doing quite well today he does have osteoarthritis and knee and low back pain. Her last colonoscopy 2008. Last hemoglobin A1c 7.4. No recent eye exam  1. Risk factors, based on past  M,S,F history- cardiovascular risk factors include diabetes hypertension and dyslipidemia  2.  Physical activities: Exercises regularly and attempt to lose weight  3.  Depression/mood: History mild depression controlled on sertraline  4.  Hearing: No deficits  5.  ADL's: Independent in all aspects of daily living  6.  Fall risk: Low  7.  Home safety: No problems identified  8.  Height weight, and visual acuity; height and weight stable no change in visual acuity  9.  Counseling: Ophthalmology referral and encouraged will need followup colonoscopy in one year  10. Lab orders based on risk factors: Laboratory profile including hemoglobin A1c will be reviewed 11. Referral : Ophthalmology referral encouraged 12. Care plan: Continue efforts at weight loss exercise regimen and better diet will check a hemoglobin A1c today  13. Cognitive assessment: Alert and oriented with normal affect. No cognitive dysfunction    Allergies:    Amoxicillin (Amoxicillin)   Past History:  Past Medical History:  Reviewed history from 02/07/2008 and no changes required.  Hyperlipidemia  Hypertension  Hypothyroidism  GERD  Obesity  DJD  Low back pain  Colonic polyps, hx of   Past Surgical History:  Reviewed history from 08/04/2007 and no changes required.  Hemorrhoidectomy  Tonsillectomy  sigmoidoscopy 1998, 2003  colonoscopy 2008   Family History:  Reviewed history from 02/07/2008 and no changes required.    father died age 88 complications of diabetes, coronary artery disease  mother died age 74, renal failure  Three brothers, one died of renal cancer  two sisters, one deceased from endometrial cancer   Social History:  Reviewed history from 02/07/2008 and no changes required.  Divorced  Never Smoked    Wt Readings from Last 3 Encounters:  12/28/10 200 lb (90.719 kg)  11/04/10 201 lb 4.8 oz (91.309 kg)  10/07/10 203 lb 4.8 oz (92.216 kg)     Review of Systems  Constitutional: Negative for fever, appetite change, fatigue and unexpected weight change.  HENT: Negative for hearing loss, ear pain, nosebleeds, congestion, sore throat, mouth sores, trouble swallowing, neck stiffness, dental problem, voice change, sinus pressure and tinnitus.   Eyes: Negative for photophobia, pain, redness and visual disturbance.  Respiratory: Negative for cough, chest tightness and shortness of breath.   Cardiovascular: Negative for chest pain, palpitations and leg swelling.  Gastrointestinal: Negative for nausea, vomiting, abdominal pain, diarrhea, constipation, blood in stool, abdominal distention and rectal pain.  Genitourinary: Negative for dysuria, urgency, frequency, hematuria, flank pain, vaginal bleeding, vaginal discharge, difficulty urinating, genital sores, vaginal pain, menstrual problem and pelvic pain.  Musculoskeletal: Positive for back pain and gait problem. Negative for arthralgias.  Skin: Negative for rash.  Neurological: Negative for dizziness, syncope, speech difficulty, weakness, light-headedness, numbness and headaches.  Hematological: Negative for adenopathy. Does not bruise/bleed easily.  Psychiatric/Behavioral: Negative for suicidal ideas, behavioral problems, self-injury, dysphoric mood and agitation. The patient is not nervous/anxious.        Objective:  Physical Exam  Constitutional: She is oriented to person, place, and time. She appears well-developed and well-nourished.        Obese. Blood pressure 120/70  HENT:  Head: Normocephalic and atraumatic.  Right Ear: External ear normal.  Left Ear: External ear normal.  Mouth/Throat: Oropharynx is clear and moist.  Eyes: Conjunctivae and EOM are normal.  Neck: Normal range of motion. Neck supple. No JVD present. No thyromegaly present.  Cardiovascular: Normal rate, regular rhythm, normal heart sounds and intact distal pulses.   No murmur heard.      Posterior tibia pulses full  Pulmonary/Chest: Effort normal and breath sounds normal. She has no wheezes. She has no rales.  Abdominal: Soft. Bowel sounds are normal. She exhibits no distension and no mass. There is no tenderness. There is no rebound and no guarding.  Musculoskeletal: Normal range of motion. She exhibits no edema and no tenderness.  Neurological: She is alert and oriented to person, place, and time. She has normal reflexes. No cranial nerve deficit. She exhibits normal muscle tone. Coordination normal.       Foot exam unremarkable intact to vibratory sensation and monofilament testing  Skin: Skin is warm and dry. No rash noted.  Psychiatric: She has a normal mood and affect. Her behavior is normal.          Assessment & Plan:   Preventive health examination Diabetes. White cell issues discussed and stressed. We'll check a hemoglobin A1c Hypertension well controlled Dyslipidemia. We'll check a lipid profile

## 2010-12-28 NOTE — Telephone Encounter (Signed)
Refer  ophthalmology

## 2010-12-28 NOTE — Telephone Encounter (Signed)
VM - requesting referral to eye doctor - does not care for the one she is currently seeing.

## 2010-12-29 ENCOUNTER — Ambulatory Visit: Payer: BLUE CROSS/BLUE SHIELD | Admitting: *Deleted

## 2010-12-29 NOTE — Telephone Encounter (Signed)
done

## 2010-12-30 ENCOUNTER — Telehealth: Payer: Self-pay

## 2010-12-30 MED ORDER — LEVOTHYROXINE SODIUM 125 MCG PO TABS: 125.0000 ug | ORAL_TABLET | Freq: Every day | ORAL | Status: DC

## 2010-12-30 NOTE — Telephone Encounter (Signed)
Attempt to call pt - VM - LMTCB if questions - adjustment to thyroid med - new rx sent to pharmacy. KIK

## 2011-01-05 ENCOUNTER — Ambulatory Visit: Payer: BLUE CROSS/BLUE SHIELD | Admitting: *Deleted

## 2011-01-14 ENCOUNTER — Encounter: Payer: Medicare Other | Attending: Internal Medicine | Admitting: *Deleted

## 2011-01-14 ENCOUNTER — Encounter: Payer: Self-pay | Admitting: *Deleted

## 2011-01-14 DIAGNOSIS — Z713 Dietary counseling and surveillance: Secondary | ICD-10-CM | POA: Insufficient documentation

## 2011-01-14 DIAGNOSIS — E119 Type 2 diabetes mellitus without complications: Secondary | ICD-10-CM | POA: Insufficient documentation
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
## 2011-01-14 NOTE — Patient Instructions (Addendum)
Goals:    Continue previous goals; No meal skipping.  Try walnuts or other nuts with snacks - limit higher fat nuts to 1/2 cup per serving. Limit sugar-sweetened beverages and concentrated sweets. Resume checking blood sugars as ordered by doctor - Call with any questions regarding new meter. Keep food journal for 2-3 days and bring to next visit. Aim to exercise most days - incorporate more swimming to relieve pain in hip, knee, back and leg.

## 2011-02-03 ENCOUNTER — Ambulatory Visit: Payer: BLUE CROSS/BLUE SHIELD | Admitting: *Deleted

## 2011-02-10 ENCOUNTER — Encounter (INDEPENDENT_AMBULATORY_CARE_PROVIDER_SITE_OTHER): Payer: BLUE CROSS/BLUE SHIELD | Admitting: Ophthalmology

## 2011-02-11 ENCOUNTER — Ambulatory Visit: Payer: BLUE CROSS/BLUE SHIELD | Admitting: *Deleted

## 2011-03-02 ENCOUNTER — Encounter: Payer: Self-pay | Admitting: *Deleted

## 2011-03-02 ENCOUNTER — Encounter: Payer: Medicare Other | Attending: Internal Medicine | Admitting: *Deleted

## 2011-03-02 VITALS — Ht 64.0 in | Wt 198.9 lb

## 2011-03-02 DIAGNOSIS — Z713 Dietary counseling and surveillance: Secondary | ICD-10-CM | POA: Insufficient documentation

## 2011-03-02 DIAGNOSIS — E119 Type 2 diabetes mellitus without complications: Secondary | ICD-10-CM | POA: Insufficient documentation

## 2011-03-02 NOTE — Progress Notes (Addendum)
Medical Nutrition Therapy:  Appt start time: 1530 end time:  1600.  Primary concerns today: T2DM - Follow up. Pt has had 72 yr old sister visiting for 8 days and unable to stay with meal plan.  Pt interested in information on guava (heard on Dr. Neil Crouch) and reports she has GERD.  Discussed foods to avoid (high fat, spicy, etc).  Has lost 4 lbs since last visit.  Pt not checking BGs because she "doesn't want to know what it is"; recent A1c 6.7% (12/28/10). Sees MD every 3 mo for DM care and has appt to see eye doctor next week d/t blurry vision that has progressed over last few months. Still has a problem with night eating. Unable to obtain food recall; pt did not bring food or glucose record to visit.  Lab Results  Component Value Date   HGBA1C 6.7* 12/28/2010   MEDICATIONS:  See medication list; no changes per pt.  Recent physical activity:  No exercise for last 8 days.   Estimated needs:  (Continue) 1200-1300 calories  135-145 g carbohydrates  75-80 g protein  40-45 g fat   Progress Towards Goal(s):  Some progress noted (Wt loss); Continue.  Nutritional Diagnosis:  [place]-2.1 Impaired nutrient utilization related to excessive CHO intake and disordered eating pattern as evidenced by most recent A1c of 6.7% and patient reported CHO intake.   Intervention/Goals:   Continue previous goals; No meal skipping.  Try walnuts or other nuts with snacks - limit higher fat nuts to 1/2 cup per serving. Limit sugar-sweetened beverages, concentrated sweets, and night snacking. Resume checking blood sugars as ordered by doctor. Keep food journal for 2-3 days and bring to next visit. Aim to exercise most days - incorporate more swimming to relieve pain in hip, knee, back and leg.   Handouts Given:   GERD Nutrition Therapy  Guava handout  Monitoring/Evaluation:  Dietary intake, exercise, A1c, BG trends, and body weight in 4 week(s).   Chart purged 03/02/11. SH

## 2011-03-02 NOTE — Patient Instructions (Addendum)
Goals:   Continue previous goals; No meal skipping.  Try walnuts or other nuts with snacks - limit higher fat nuts to 1/2 cup per serving. Limit sugar-sweetened beverages, concentrated sweets, and night snacking. Resume checking blood sugars as ordered by doctor. Keep food journal for 2-3 days and bring to next visit. Aim to exercise most days - incorporate more swimming to relieve pain in hip, knee, back and leg.

## 2011-03-08 ENCOUNTER — Encounter (INDEPENDENT_AMBULATORY_CARE_PROVIDER_SITE_OTHER): Payer: Medicare Other | Admitting: Ophthalmology

## 2011-03-08 ENCOUNTER — Telehealth: Payer: Self-pay | Admitting: *Deleted

## 2011-03-08 DIAGNOSIS — E11319 Type 2 diabetes mellitus with unspecified diabetic retinopathy without macular edema: Secondary | ICD-10-CM

## 2011-03-08 DIAGNOSIS — H2510 Age-related nuclear cataract, unspecified eye: Secondary | ICD-10-CM

## 2011-03-08 DIAGNOSIS — H43819 Vitreous degeneration, unspecified eye: Secondary | ICD-10-CM

## 2011-03-08 DIAGNOSIS — E1139 Type 2 diabetes mellitus with other diabetic ophthalmic complication: Secondary | ICD-10-CM

## 2011-03-08 NOTE — Telephone Encounter (Signed)
rov THIS WEEK

## 2011-03-08 NOTE — Telephone Encounter (Signed)
She would not cooperate with me on this appt, Leslie Benton, but she wants to talk to you.  Thanks.

## 2011-03-08 NOTE — Telephone Encounter (Signed)
Long conversation with pt whether Dr. Kirtland Bouchard will give her steroid injections for her shoulder pain..  Attempted to explain, he needs to examine her, possibly xray and compare meds before he can answer that question.  She wants to know why he never did before???

## 2011-03-08 NOTE — Telephone Encounter (Signed)
Attempt to call- Vm - LMTCB and make appt per Dr. Amador Cunas request to eval before ok ing a steroid injection- we need to make sure we are tx'ing the right pain and there may be other things to be ordered

## 2011-03-29 ENCOUNTER — Ambulatory Visit (INDEPENDENT_AMBULATORY_CARE_PROVIDER_SITE_OTHER): Payer: Medicare Other | Admitting: Internal Medicine

## 2011-03-29 ENCOUNTER — Encounter: Payer: Self-pay | Admitting: Internal Medicine

## 2011-03-29 DIAGNOSIS — M1990 Unspecified osteoarthritis, unspecified site: Secondary | ICD-10-CM

## 2011-03-29 DIAGNOSIS — E119 Type 2 diabetes mellitus without complications: Secondary | ICD-10-CM

## 2011-03-29 DIAGNOSIS — I10 Essential (primary) hypertension: Secondary | ICD-10-CM

## 2011-03-29 DIAGNOSIS — E039 Hypothyroidism, unspecified: Secondary | ICD-10-CM

## 2011-03-29 LAB — HM DIABETES FOOT EXAM

## 2011-03-29 LAB — HEMOGLOBIN A1C: Hgb A1c MFr Bld: 6.7 % — ABNORMAL HIGH (ref 4.6–6.5)

## 2011-03-29 NOTE — Patient Instructions (Signed)
Please check your hemoglobin A1c every 3 months    It is important that you exercise regularly, at least 20 minutes 3 to 4 times per week.  If you develop chest pain or shortness of breath seek  medical attention.  You need to lose weight.  Consider a lower calorie diet and regular exercise. 

## 2011-03-29 NOTE — Progress Notes (Signed)
  Subjective:    Patient ID: Leslie Benton, female    DOB: [birth year], 70 y.o.   MRN: [id number]  HPI  70 year old patient who is seen today for followup of her type 2 diabetes her last hemoglobin A1c 3 months ago was at goal. She has treated hypertension as well as hypothyroidism. She is being followed by subspecialty physicians for her osteoarthritis. She has dyslipidemia. Blood sugars have been doing well. Laboratory studies from 3 months ago are reviewed and TSH was suppressed    Review of Systems  Constitutional: Negative.   HENT: Negative for hearing loss, congestion, sore throat, rhinorrhea, dental problem, sinus pressure and tinnitus.   Eyes: Negative for pain, discharge and visual disturbance.  Respiratory: Negative for cough and shortness of breath.   Cardiovascular: Negative for chest pain, palpitations and leg swelling.  Gastrointestinal: Negative for nausea, vomiting, abdominal pain, diarrhea, constipation, blood in stool and abdominal distention.  Genitourinary: Negative for dysuria, urgency, frequency, hematuria, flank pain, vaginal bleeding, vaginal discharge, difficulty urinating, vaginal pain and pelvic pain.  Musculoskeletal: Negative for joint swelling, arthralgias (shoulder and hip pain) and gait problem.  Skin: Negative for rash.  Neurological: Negative for dizziness, syncope, speech difficulty, weakness, numbness and headaches.  Hematological: Negative for adenopathy.  Psychiatric/Behavioral: Negative for behavioral problems, dysphoric mood and agitation. The patient is not nervous/anxious.        Objective:   Physical Exam  Constitutional: She is oriented to person, place, and time. She appears well-developed and well-nourished.  HENT:  Head: Normocephalic.  Right Ear: External ear normal.  Left Ear: External ear normal.  Mouth/Throat: Oropharynx is clear and moist.  Eyes: Conjunctivae and EOM are normal. Pupils are equal, round, and reactive to light.  Neck:  Normal range of motion. Neck supple. No thyromegaly present.  Cardiovascular: Normal rate, regular rhythm, normal heart sounds and intact distal pulses.   Pulmonary/Chest: Effort normal and breath sounds normal.  Abdominal: Soft. Bowel sounds are normal. She exhibits no mass. There is no tenderness.  Musculoskeletal: Normal range of motion.  Lymphadenopathy:    She has no cervical adenopathy.  Neurological: She is alert and oriented to person, place, and time.  Skin: Skin is warm and dry. No rash noted.  Psychiatric: She has a normal mood and affect. Her behavior is normal.          Assessment & Plan:   Diabetes mellitus. We'll check a hemoglobin A1c Hypothyroidism. Last TSH was suppressed we'll check again today if still low will decrease her Synthroid dose Hypertension stable

## 2011-03-30 ENCOUNTER — Ambulatory Visit: Payer: BLUE CROSS/BLUE SHIELD | Admitting: *Deleted

## 2011-03-31 ENCOUNTER — Encounter: Payer: Self-pay | Admitting: *Deleted

## 2011-03-31 ENCOUNTER — Encounter: Payer: Medicare Other | Attending: Internal Medicine | Admitting: *Deleted

## 2011-03-31 VITALS — Ht 64.0 in | Wt 197.5 lb

## 2011-03-31 DIAGNOSIS — E119 Type 2 diabetes mellitus without complications: Secondary | ICD-10-CM | POA: Insufficient documentation

## 2011-03-31 DIAGNOSIS — Z713 Dietary counseling and surveillance: Secondary | ICD-10-CM | POA: Insufficient documentation

## 2011-03-31 NOTE — Patient Instructions (Addendum)
Continue previous goals listed below:  No meal skipping.  Try walnuts or other nuts with snacks - limit higher fat nuts to 1/2 cup per serving. Limit sugar-sweetened beverages, concentrated sweets, and night snacking. Continue checking blood sugars as ordered by doctor. Keep food journal for 2-3 days and bring to next visit. Aim to exercise most days - incorporate more swimming to relieve pain in hip, knee, back and leg.   Your Estimated Needs Are:  1200-1300 calories  135-145 g carbohydrates  75-80 g protein  40-45 g fat

## 2011-03-31 NOTE — Progress Notes (Signed)
Medical Nutrition Therapy:  Appt start time: 1530   End time:  1600.  Primary concerns today: T2DM - Follow up. Pt here for follow up.  Has maintained A1c at 6.7% for last 6 months. Reports eating a bananas (only) for lunch - plans to add oven-roasted walnuts. Dinner last night of corn beef, cabbage, sweet potatoes; breakfast of oatmeal w/ frozen blueberries and sweet-n-low. Continues to skip meals, though sometimes snacks in between. Eats small portions. Reports recent DM eye appointment and was told she "does not have diabetes in her eyes".  Has another appointment next week to check glaucoma.  Has resumed checking FBGs and reports last 3 days as 120, 121, and 127 (mg/dl).  Has resumed exercising via Zumba classes, but teachers have been sick recently and has not been able to attend.  Lab Results  Component Value Date   HGBA1C 6.7* 03/29/2011   MEDICATIONS:  See medication list; reconciled with pt.  Recent physical activity:  Zumba today at the gym; has been unable to do many classes d/t teachers being sick.  Estimated needs:  (Continue) 1200-1300 calories  135-145 g carbohydrates  75-80 g protein  40-45 g fat   Progress Towards Goal(s):  Some progress noted (Wt loss); Continue.  Nutritional Diagnosis:  [place]-2.1 Impaired nutrient utilization related to excessive CHO intake and disordered eating pattern as evidenced by most recent A1c of 6.7% and patient reported CHO intake.   Intervention/Goals: Continue previous goals   No meal skipping.  Try walnuts or other nuts with snacks - limit higher fat nuts to 1/2 cup per serving. Limit sugar-sweetened beverages, concentrated sweets, and night snacking. Resume checking blood sugars as ordered by doctor. Keep food journal for 2-3 days and bring to next visit. Aim to exercise most days - incorporate more swimming to relieve pain in hip, knee, back and leg.   Monitoring/Evaluation:  Dietary intake, exercise, A1c (as available), BG trends, and  body weight in 4 week(s).   Chart purged 03/02/11. SH

## 2011-04-27 ENCOUNTER — Encounter: Payer: Medicare Other | Attending: Internal Medicine | Admitting: *Deleted

## 2011-04-27 ENCOUNTER — Encounter: Payer: Self-pay | Admitting: *Deleted

## 2011-04-27 DIAGNOSIS — Z713 Dietary counseling and surveillance: Secondary | ICD-10-CM | POA: Diagnosis not present

## 2011-04-27 DIAGNOSIS — E119 Type 2 diabetes mellitus without complications: Secondary | ICD-10-CM | POA: Insufficient documentation

## 2011-04-27 NOTE — Patient Instructions (Addendum)
Goals:  Continue previous goals.  Aim for 45 g carbs at meals; 15 g at snacks - have protein with all meals and snacks. No meal skipping. Limit sugar-sweetened beverages, concentrated sweets, and night snacking.  Resume checking blood sugar as ordered by your doctor.

## 2011-04-27 NOTE — Progress Notes (Signed)
Medical Nutrition Therapy:  Appt start time: 1530   End time:  1600.  Primary concerns today: T2DM - Follow up. Pt here for follow up.  Most of visit consisted of pt asking about shrink wrap liposuction and who does this procedure in [place].  Still having trouble with night snacking (dried vegetables from HT-green beans, carrots).  Per pt, FBG at MD today 132 mg/dL.  Missed metformin dose the day before. Is not checking BG at home.  Still highly increased stress level present and pt appears depressed; denies need for counseling.  MEDICATIONS:  See medication list; pt reports no changes. Not using strips/lancets.  Lab Results  Component Value Date   HGBA1C 6.7* 03/29/2011   Recent physical activity:  Zumba today at the gym; Still going, but reports severe pain in back.  Estimated needs:  (Continue) 1200-1300 calories  135-145 g carbohydrates  75-80 g protein  40-45 g fat   Progress Towards Goal(s):  No progress noted; Continue.  Nutritional Diagnosis:  [place]-2.1 Impaired nutrient utilization related to excessive CHO intake and disordered eating pattern as evidenced by most recent A1c of 6.7% and patient reported CHO intake.   Intervention/Goals:   Continue previous goals.  No meal skipping.  Try walnuts or other nuts with snacks - limit higher fat nuts to 1/2 cup per serving. Limit sugar-sweetened beverages, concentrated sweets, and night snacking.  Check blood sugars as ordered by your doctor.  Monitoring/Evaluation:  Dietary intake, exercise, A1c (as available), BG trends, and body weight in 4 week(s).

## 2011-04-29 ENCOUNTER — Encounter: Payer: Self-pay | Admitting: *Deleted

## 2011-05-18 ENCOUNTER — Telehealth: Payer: Self-pay | Admitting: Internal Medicine

## 2011-05-18 NOTE — Telephone Encounter (Signed)
Pt called and said that she is suppose to be going to see a Orthopedic doctor,Dr. Madelon Lips,  re: pts shoulder. This doctor was highly recommended. Pt just wanted to make Dr Amador Cunas aware.

## 2011-05-24 DIAGNOSIS — M25519 Pain in unspecified shoulder: Secondary | ICD-10-CM | POA: Diagnosis not present

## 2011-05-25 ENCOUNTER — Encounter: Payer: Self-pay | Admitting: *Deleted

## 2011-05-25 ENCOUNTER — Encounter: Payer: Medicare Other | Attending: Internal Medicine | Admitting: *Deleted

## 2011-05-25 DIAGNOSIS — Z713 Dietary counseling and surveillance: Secondary | ICD-10-CM | POA: Insufficient documentation

## 2011-05-25 DIAGNOSIS — E119 Type 2 diabetes mellitus without complications: Secondary | ICD-10-CM | POA: Insufficient documentation

## 2011-05-25 NOTE — Progress Notes (Signed)
Medical Nutrition Therapy:  Appt start time: 1530   End time:  1600.  Primary concerns today: T2DM - Follow up. Pt here for follow up.  Has not been checking blood sugars and reports shot of cortisone in shoulder yesterday (05/24/11). Hip still painful and states MD addressing in a few weeks. Has good days and bad days with eating.  Still exercising fairly consistently.  Family life continues to be stressful with husband and son-in-law who are very negative and draining.  Pt seems focused on that more than DM. Is tired of chicken and   MEDICATIONS:  See medication list; pt reports no changes. Still not using strips/lancets.  Lab Results  Component Value Date   HGBA1C 6.7* 03/29/2011   Recent physical activity:  Still going to gym several times a week.  Estimated needs:  (Continue) 1200-1300 calories  135-145 g carbohydrates  75-80 g protein  40-45 g fat   Progress Towards Goal(s):  No progress noted; Continue.  Nutritional Diagnosis:  [place]-2.1 Impaired nutrient utilization related to excessive CHO intake and disordered eating pattern as evidenced by most recent A1c of 6.7% and patient reported CHO intake.   Intervention/Goals:   Continue previous goals.  No meal skipping.  Try walnuts or other nuts with snacks - limit higher fat nuts to 1/2 cup per serving. Limit sugar-sweetened beverages, concentrated sweets, and night snacking.  Check blood sugars as ordered by your doctor.  Monitoring/Evaluation:  Dietary intake, exercise, A1c (as available), BG trends, and body weight in 4 week(s).

## 2011-05-25 NOTE — Patient Instructions (Addendum)
Continue the Previous Goals (Below):  Continue previous goals.  Aim for 45 g carbs at meals; 15 g at snacks - have protein with all meals and snacks. No meal skipping. Limit sugar-sweetened beverages, concentrated sweets, and night snacking.  Resume checking blood sugar as ordered by your doctor.

## 2011-06-21 DIAGNOSIS — M545 Low back pain: Secondary | ICD-10-CM | POA: Diagnosis not present

## 2011-06-22 ENCOUNTER — Ambulatory Visit: Payer: Medicare Other | Admitting: *Deleted

## 2011-06-30 ENCOUNTER — Encounter: Payer: Medicare Other | Attending: Internal Medicine | Admitting: *Deleted

## 2011-06-30 ENCOUNTER — Encounter: Payer: Self-pay | Admitting: *Deleted

## 2011-06-30 DIAGNOSIS — Z713 Dietary counseling and surveillance: Secondary | ICD-10-CM | POA: Diagnosis not present

## 2011-06-30 DIAGNOSIS — E119 Type 2 diabetes mellitus without complications: Secondary | ICD-10-CM | POA: Insufficient documentation

## 2011-06-30 NOTE — Patient Instructions (Addendum)
Goals:  Continue previous goals.  No meal skipping.  Try walnuts or other nuts with snacks - limit higher fat nuts to 1/2 cup per serving.  Limit sugar-sweetened beverages, concentrated sweets, and night snacking.   Resume testing blood sugars as ordered by your doctor.  Consider working with Lurena Joiner (the therapist) and writing down the things you do daily.

## 2011-06-30 NOTE — Progress Notes (Signed)
Medical Nutrition Therapy:  Appt start time: 1530   End time:  1600.  Primary concerns today: T2DM - Follow up. Pt here for follow up. Is very frustrated at inability to stand up long enough to scramble an egg without having to sit down d/t acute hip pain. Reports she recently received cortisone shot in L shoulder a few weeks ago, but states MD did not address hip pain. Continues Zumba 2-3 times a week, but has no energy.  Reports FBGs of 127-136 mg, then were elevated with cortisone shot. Has stopped checking blood glucose. No new A1c available. Reports everything "tastes bad"; can eat potato bread and potato chips. Is eating emotionally. States she may start seeing a new therapist, which I encouraged.    MEDICATIONS:  See medication list; pt reports no changes. Still not using strips/lancets.  Recent physical activity:  Still going to Zumba 2-3 days/week.  Estimated needs:  (Continue) 1200-1300 calories  135-145 g carbohydrates  75-80 g protein  40-45 g fat   Progress Towards Goal(s):  No progress noted; Continue.  Nutritional Diagnosis:  [place]-2.1 Impaired nutrient utilization related to excessive CHO intake and disordered eating pattern as evidenced by most recent A1c of 6.7% and patient reported CHO intake.   Intervention/Goals:   Continue previous goals.  No meal skipping.  Try walnuts or other nuts with snacks - limit higher fat nuts to 1/2 cup per serving. Limit sugar-sweetened beverages, concentrated sweets, and night snacking.  Resume testing blood sugars as ordered by your doctor. Consider working with Lurena Joiner (the therapist) and writing down the things you do daily.   Monitoring/Evaluation:  Dietary intake, exercise, A1c (as available), BG trends, and body weight in 4 week(s).

## 2011-07-01 ENCOUNTER — Encounter: Payer: Self-pay | Admitting: Internal Medicine

## 2011-07-01 ENCOUNTER — Other Ambulatory Visit: Payer: Self-pay | Admitting: Internal Medicine

## 2011-07-01 ENCOUNTER — Ambulatory Visit (INDEPENDENT_AMBULATORY_CARE_PROVIDER_SITE_OTHER): Payer: Medicare Other | Admitting: Internal Medicine

## 2011-07-01 VITALS — BP 130/72 | HR 85 | Temp 97.6°F | Wt 200.0 lb

## 2011-07-01 DIAGNOSIS — I10 Essential (primary) hypertension: Secondary | ICD-10-CM | POA: Diagnosis not present

## 2011-07-01 DIAGNOSIS — E785 Hyperlipidemia, unspecified: Secondary | ICD-10-CM

## 2011-07-01 DIAGNOSIS — E119 Type 2 diabetes mellitus without complications: Secondary | ICD-10-CM | POA: Diagnosis not present

## 2011-07-01 LAB — HEMOGLOBIN A1C: Hgb A1c MFr Bld: 7.1 % — ABNORMAL HIGH (ref 4.6–6.5)

## 2011-07-01 MED ORDER — METFORMIN HCL 1000 MG PO TABS: 1000.0000 mg | ORAL_TABLET | Freq: Two times a day (BID) | ORAL | Status: DC

## 2011-07-01 MED ORDER — GLIMEPIRIDE 4 MG PO TABS: 2.0000 mg | ORAL_TABLET | Freq: Every day | ORAL | Status: DC

## 2011-07-01 MED ORDER — OMEPRAZOLE 20 MG PO CPDR: 20.0000 mg | DELAYED_RELEASE_CAPSULE | Freq: Every day | ORAL | Status: DC

## 2011-07-01 MED ORDER — LEVOTHYROXINE SODIUM 125 MCG PO TABS: 125.0000 ug | ORAL_TABLET | Freq: Every day | ORAL | Status: DC

## 2011-07-01 MED ORDER — LISINOPRIL-HYDROCHLOROTHIAZIDE 20-12.5 MG PO TABS: 1.0000 | ORAL_TABLET | Freq: Every day | ORAL | Status: DC

## 2011-07-01 NOTE — Patient Instructions (Addendum)
Limit your sodium (Salt) intake    It is important that you exercise regularly, at least 20 minutes 3 to 4 times per week.  If you develop chest pain or shortness of breath seek  medical attention.  Please check your blood pressure on a regular basis.  If it is consistently greater than 150/90, please make an office appointment.   Please check your hemoglobin A1c every 3 months  Mucinex D  One twice daily

## 2011-07-01 NOTE — Progress Notes (Signed)
  Subjective:    Patient ID: Leslie Benton, female    DOB: [birth year], 71 y.o.   MRN: [id number]  HPI 71 year old patient who is seen today for followup of her type 2 diabetes and hypertension. His problems are nicely controlled. She is being followed by a nutritionist. She has a history of obesity and has a difficult time with weight loss. She does go to a health club. In general doing well except for some mild nasal congestion;  her last 2 hemoglobin A1c is 6.7     Review of Systems  Constitutional: Negative.   HENT: Positive for congestion. Negative for hearing loss, sore throat, rhinorrhea, dental problem, sinus pressure and tinnitus.   Eyes: Negative for pain, discharge and visual disturbance.  Respiratory: Negative for cough and shortness of breath.   Cardiovascular: Negative for chest pain, palpitations and leg swelling.  Gastrointestinal: Negative for nausea, vomiting, abdominal pain, diarrhea, constipation, blood in stool and abdominal distention.  Genitourinary: Negative for dysuria, urgency, frequency, hematuria, flank pain, vaginal bleeding, vaginal discharge, difficulty urinating, vaginal pain and pelvic pain.  Musculoskeletal: Negative for joint swelling, arthralgias and gait problem.  Skin: Negative for rash.  Neurological: Negative for dizziness, syncope, speech difficulty, weakness, numbness and headaches.  Hematological: Negative for adenopathy.  Psychiatric/Behavioral: Negative for behavioral problems, dysphoric mood and agitation. The patient is not nervous/anxious.        Objective:   Physical Exam  Constitutional: She is oriented to person, place, and time. She appears well-nourished. No distress.       Obese. Afebrile. Blood pressure low normal  HENT:  Head: Normocephalic.  Right Ear: External ear normal.  Left Ear: External ear normal.  Mouth/Throat: Oropharynx is clear and moist.  Eyes: Conjunctivae and EOM are normal. Pupils are equal, round, and reactive to  light.  Neck: Normal range of motion. Neck supple. No thyromegaly present.  Cardiovascular: Normal rate, regular rhythm, normal heart sounds and intact distal pulses.   Pulmonary/Chest: Effort normal and breath sounds normal.  Abdominal: Soft. Bowel sounds are normal. She exhibits no mass. There is no tenderness.  Musculoskeletal: Normal range of motion.  Lymphadenopathy:    She has no cervical adenopathy.  Neurological: She is alert and oriented to person, place, and time.  Skin: Skin is warm and dry. No rash noted.  Psychiatric: She has a normal mood and affect. Her behavior is normal.          Assessment & Plan:  Viral URI. Will treat symptomatically Diabetes well-controlled Will followup a hemoglobin A1c hypertension well controlled Exogenous obesity

## 2011-07-02 ENCOUNTER — Telehealth: Payer: Self-pay | Admitting: Internal Medicine

## 2011-07-02 NOTE — Telephone Encounter (Signed)
Please call.

## 2011-07-02 NOTE — Telephone Encounter (Signed)
Pt called req call back from Dr Amador Cunas re: referral she recvd re: orthopedic referral to Dr Alvester Morin. Pt has some questions re: Dr Alvester Morin and wants to know if Dr Amador Cunas knows him and if he recommended him?

## 2011-07-07 DIAGNOSIS — IMO0002 Reserved for concepts with insufficient information to code with codable children: Secondary | ICD-10-CM | POA: Diagnosis not present

## 2011-07-07 DIAGNOSIS — M48061 Spinal stenosis, lumbar region without neurogenic claudication: Secondary | ICD-10-CM | POA: Diagnosis not present

## 2011-07-20 DIAGNOSIS — M545 Low back pain: Secondary | ICD-10-CM | POA: Diagnosis not present

## 2011-07-20 DIAGNOSIS — M47817 Spondylosis without myelopathy or radiculopathy, lumbosacral region: Secondary | ICD-10-CM | POA: Diagnosis not present

## 2011-07-20 DIAGNOSIS — M205X9 Other deformities of toe(s) (acquired), unspecified foot: Secondary | ICD-10-CM | POA: Diagnosis not present

## 2011-07-20 DIAGNOSIS — M21619 Bunion of unspecified foot: Secondary | ICD-10-CM | POA: Diagnosis not present

## 2011-07-26 DIAGNOSIS — M545 Low back pain: Secondary | ICD-10-CM | POA: Diagnosis not present

## 2011-07-26 DIAGNOSIS — M47817 Spondylosis without myelopathy or radiculopathy, lumbosacral region: Secondary | ICD-10-CM | POA: Diagnosis not present

## 2011-08-05 ENCOUNTER — Encounter: Payer: Medicare Other | Attending: Internal Medicine | Admitting: *Deleted

## 2011-08-05 ENCOUNTER — Encounter: Payer: Self-pay | Admitting: *Deleted

## 2011-08-05 VITALS — Ht 64.0 in | Wt 196.4 lb

## 2011-08-05 DIAGNOSIS — E119 Type 2 diabetes mellitus without complications: Secondary | ICD-10-CM | POA: Insufficient documentation

## 2011-08-05 DIAGNOSIS — I10 Essential (primary) hypertension: Secondary | ICD-10-CM

## 2011-08-05 DIAGNOSIS — E785 Hyperlipidemia, unspecified: Secondary | ICD-10-CM

## 2011-08-05 DIAGNOSIS — Z713 Dietary counseling and surveillance: Secondary | ICD-10-CM | POA: Diagnosis not present

## 2011-08-05 NOTE — Patient Instructions (Addendum)
Goals: (CONTINUE)  Continue previous goals.  No meal skipping.  Try walnuts or other nuts with snacks - limit higher fat nuts to 1/2 cup per serving.  Limit sugar-sweetened beverages, concentrated sweets, and night snacking.   Resume testing blood sugars as ordered by your doctor.

## 2011-08-05 NOTE — Progress Notes (Signed)
Medical Nutrition Therapy:  Appt start time: 1530   End time:  1600.  Primary concerns today: T2DM - Follow up. Here for f/u. Reports craving meat, eggs, and plain potatoes "all the time".  No exercise this week d/t cleaning for guests this weekend. Reports FBG of 134, 124, 134, and 154 (4/15-4/18).  Made chili with ground beef and cheese and had over 3/4 cup of rice.  Followed by physiatrist @ [facility] (Dr. Naaman Plummer) for hip pain. A1c up from previous, likely d/t recent cortisone treatments and pt's increased intake of CHO. Does not seem to understand she needs to limit CHO to decrease BGs. Will continue much needed education.   Lab Results  Component Value Date   HGBA1C 7.1* 07/01/2011    MEDICATIONS:  See medication list; pt reports checking BGs.  Recent physical activity:  No exercise the past week.   Estimated needs:  (Continue) 1200-1300 calories  135-145 g carbohydrates  75-80 g protein  40-45 g fat   Progress Towards Goal(s):  Some progress noted with weight loss; Continue.  Nutritional Diagnosis:  [place]-2.1 Impaired nutrient utilization related to excessive CHO intake and disordered eating pattern as evidenced by most recent A1c of 6.7% and patient reported CHO intake.   Intervention/Goals:   Continue previous goals.  No meal skipping.  Try walnuts or other nuts with snacks - limit higher fat nuts to 1/2 cup per serving. Limit sugar-sweetened beverages, concentrated sweets, and night snacking.  Resume testing blood sugars as ordered by your doctor.  Monitoring/Evaluation:  Dietary intake, exercise, A1c (as available), BG trends, and body weight in 4 week(s).

## 2011-08-30 ENCOUNTER — Telehealth: Payer: Self-pay | Admitting: Internal Medicine

## 2011-08-30 NOTE — Telephone Encounter (Signed)
rov this week 

## 2011-08-30 NOTE — Telephone Encounter (Signed)
Pt is sch for 09-02-2011 230pm. Pt unable to come in sooner

## 2011-08-30 NOTE — Telephone Encounter (Signed)
She is due to come in next month - would you like to see early? Last seen 06/2011 - 3mos rov , would you like to do blood work?

## 2011-08-30 NOTE — Telephone Encounter (Signed)
Pease make appt to see

## 2011-08-30 NOTE — Telephone Encounter (Signed)
Pt requesting to speak with Selena Batten or Dr. Kirtland Bouchard about being extremely tired.  Wants to modify medication in order to alleviate this symptom.  If no answer ok to leave a message on voicemail.

## 2011-09-02 ENCOUNTER — Ambulatory Visit (INDEPENDENT_AMBULATORY_CARE_PROVIDER_SITE_OTHER): Payer: Medicare Other | Admitting: Internal Medicine

## 2011-09-02 ENCOUNTER — Encounter: Payer: Self-pay | Admitting: Internal Medicine

## 2011-09-02 VITALS — BP 116/70 | Temp 97.8°F | Wt 199.0 lb

## 2011-09-02 DIAGNOSIS — I10 Essential (primary) hypertension: Secondary | ICD-10-CM | POA: Diagnosis not present

## 2011-09-02 DIAGNOSIS — E119 Type 2 diabetes mellitus without complications: Secondary | ICD-10-CM

## 2011-09-02 NOTE — Progress Notes (Signed)
  Subjective:    Patient ID: Leslie Benton, female    DOB: April 08, 1941, 71 y.o.   MRN: [id number]  HPI 71 year old patient who is seen today for followup. She has a history of hypertension and diabetes. Early she had some diarrhea and weakness and was seen today for further evaluation. The diarrhea has resolved and she feels quite well at the present time. Blood sugars have remained quite stable.   Review of Systems  Constitutional: Positive for fatigue.  Gastrointestinal: Positive for diarrhea.       Objective:   Physical Exam  Constitutional: She is oriented to person, place, and time. She appears well-developed and well-nourished.  HENT:  Head: Normocephalic.  Right Ear: External ear normal.  Left Ear: External ear normal.  Mouth/Throat: Oropharynx is clear and moist.  Eyes: Conjunctivae and EOM are normal. Pupils are equal, round, and reactive to light.  Neck: Normal range of motion. Neck supple. No thyromegaly present.  Cardiovascular: Normal rate, regular rhythm, normal heart sounds and intact distal pulses.   Pulmonary/Chest: Effort normal and breath sounds normal.  Abdominal: Soft. Bowel sounds are normal. She exhibits no distension and no mass. There is no tenderness. There is no rebound and no guarding.  Musculoskeletal: Normal range of motion.  Lymphadenopathy:    She has no cervical adenopathy.  Neurological: She is alert and oriented to person, place, and time.  Skin: Skin is warm and dry. No rash noted.  Psychiatric: She has a normal mood and affect. Her behavior is normal.          Assessment & Plan:   Diarrhea resolved. Hypertension nice control Diabetes. Will check hemoglobin A1c next visit these have been reasonably well-controlled Hypothyroidism

## 2011-09-02 NOTE — Progress Notes (Signed)
  Subjective:    Patient ID: Leslie Benton, female    DOB: 08/02/1940, 71 y.o.   MRN: [id number]  HPI  Wt Readings from Last 3 Encounters:  09/02/11 199 lb (90.266 kg)  08/05/11 196 lb 6.4 oz (89.086 kg)  07/01/11 200 lb (90.719 kg)    Review of Systems     Objective:   Physical Exam        Assessment & Plan:

## 2011-09-02 NOTE — Patient Instructions (Signed)
Please check your hemoglobin A1c every 3 months  Limit your sodium (Salt) intake   

## 2011-09-08 ENCOUNTER — Encounter: Payer: Self-pay | Admitting: *Deleted

## 2011-09-08 ENCOUNTER — Encounter: Payer: Medicare Other | Attending: Internal Medicine | Admitting: *Deleted

## 2011-09-08 VITALS — Ht 64.0 in | Wt 199.5 lb

## 2011-09-08 DIAGNOSIS — E119 Type 2 diabetes mellitus without complications: Secondary | ICD-10-CM | POA: Diagnosis not present

## 2011-09-08 DIAGNOSIS — Z713 Dietary counseling and surveillance: Secondary | ICD-10-CM | POA: Insufficient documentation

## 2011-09-08 NOTE — Progress Notes (Signed)
Medical Nutrition Therapy:  Appt start time: 1530   End time:  1600.  Primary concerns today: T2DM - Follow up. Reports she got a shot of cortisone for her hip 2-3 weeks ago and increased hunger. Reports increase in stress levels and stopped exercising 2 weeks ago due to getting a "cold sweat" during class one day.  States she feels like she isn't steady on her feet lately and has to rest on her elbow a lot. Has created an infection or bruising in elbow.  Reports FBG today of 120 mg.  Interest continues in having liposuction and asks about bariatric surgery. Discussed the various surgeries and recommended she go to seminar for more information.   Lab Results  Component Value Date   HGBA1C 7.1* 07/01/2011   MEDICATIONS:  See medication list, no changes reported.  Recent physical activity:  No exercise in the last 2 weeks.   Estimated needs:  (Continue) 1200-1300 calories  135-145 g carbohydrates  75-80 g protein  40-45 g fat   Progress Towards Goal(s):  In progress.  Nutritional Diagnosis:  [place]-2.1 Impaired nutrient utilization related to excessive CHO intake and disordered eating pattern as evidenced by most recent A1c of 6.7% and patient reported CHO intake.   Intervention/Goals:   Continue previous goals.  No meal skipping.  Limit sugar-sweetened beverages, concentrated sweets, and night snacking.   Monitoring/Evaluation:  Dietary intake, exercise, A1c (as available), BG trends, and body weight in 4 week(s).

## 2011-09-08 NOTE — Patient Instructions (Signed)
Goals: (CONTINUE)  Continue previous goals.  No meal skipping.  Limit sugar-sweetened beverages, concentrated sweets, and night snacking.

## 2011-09-30 ENCOUNTER — Ambulatory Visit: Payer: Medicare Other | Admitting: Internal Medicine

## 2011-09-30 ENCOUNTER — Encounter: Payer: Medicare Other | Attending: Internal Medicine | Admitting: *Deleted

## 2011-09-30 ENCOUNTER — Encounter: Payer: Self-pay | Admitting: *Deleted

## 2011-09-30 DIAGNOSIS — Z713 Dietary counseling and surveillance: Secondary | ICD-10-CM | POA: Insufficient documentation

## 2011-09-30 DIAGNOSIS — E119 Type 2 diabetes mellitus without complications: Secondary | ICD-10-CM | POA: Insufficient documentation

## 2011-09-30 NOTE — Patient Instructions (Addendum)
Goals:   Continue previous goals.  No meal skipping.  Limit sugar-sweetened beverages, concentrated sweets, refined carbs (ex: potato chips, etc), and night snacking.  Resume exercising; contact our doctor for recommendations for allergy medicine.

## 2011-09-30 NOTE — Progress Notes (Signed)
Medical Nutrition Therapy:  Appt start time: 1530   End time:  1600.  Primary concerns today: T2DM - Follow up. Brytney returns today for monthly f/u. Reports she took the month off from the gym and planned to work in the yard, but was unable to d/t getting sick from pollen/bugs outside.  Also thinks she cannot taste anything d/t recent sickness, so has been eating potato chips (which she CAN taste).  States she has a chronic cough which she is convinced is d/t lisinopril-HCTZ. Discussed decreasing CHO intake, though Kynsli states she wants "liposuction to just suck out the fat". Reports FBS of 120-124 mg; checks 1-2 times/week. Husband or son checks for her as she is scared to do it herself.   Lab Results  Component Value Date   HGBA1C 7.1* 07/01/2011    MEDICATIONS:  See medication list, no changes reported.  Recent physical activity:  No exercise in the last 4 weeks.   Estimated needs:  (Continue) 1200-1300 calories  135-145 g carbohydrates  75-80 g protein  40-45 g fat   Progress Towards Goal(s):  In progress.  Nutritional Diagnosis:  [place]-2.1 Impaired nutrient utilization related to excessive CHO intake and disordered eating pattern as evidenced by most recent A1c of 6.7% and patient reported CHO intake.   Intervention/Goals:   Continue previous goals.  No meal skipping.  Limit sugar-sweetened beverages, concentrated sweets, refined carbs (ex: potato chips, etc), and night snacking.  Resume exercising; contact our doctor for recommendations for allergy medicine.    Monitoring/Evaluation:  Dietary intake, exercise, A1c (as available), BG trends, and body weight in 4 week(s).

## 2011-10-06 DIAGNOSIS — H2510 Age-related nuclear cataract, unspecified eye: Secondary | ICD-10-CM | POA: Diagnosis not present

## 2011-10-06 DIAGNOSIS — E119 Type 2 diabetes mellitus without complications: Secondary | ICD-10-CM | POA: Diagnosis not present

## 2011-10-06 DIAGNOSIS — H40019 Open angle with borderline findings, low risk, unspecified eye: Secondary | ICD-10-CM | POA: Diagnosis not present

## 2011-10-06 DIAGNOSIS — E11319 Type 2 diabetes mellitus with unspecified diabetic retinopathy without macular edema: Secondary | ICD-10-CM | POA: Diagnosis not present

## 2011-10-09 ENCOUNTER — Other Ambulatory Visit: Payer: Self-pay | Admitting: Internal Medicine

## 2011-10-13 ENCOUNTER — Telehealth: Payer: Self-pay | Admitting: Internal Medicine

## 2011-10-13 NOTE — Telephone Encounter (Signed)
Pt requesting to have a message left on her phone if she does not answer

## 2011-10-13 NOTE — Telephone Encounter (Signed)
Caller: Leslie Benton/Patient; PCP: Eleonore Chiquito; CB#: [phone];  Call regarding Cough; Asking to stop or decrease dose of Lisinopril for chronic dry cough.  Onset: "I dont know." > 6 months.  Afebrile.  Has to clear throat frequently.  Hoarseness audible.  Read about Lisinopril side effect.  RPh suggested change to Cozaar.  Audible frequent spells of dry cough with new onset of fatigue.  Blood sugar 112 10/12/11. Advised to see MD within 24 hrs for symptoms began after using therapy prescribed by provider per Diabetes Respiratory Problems.  Declined appt unless MD advises since seen  09/02/11;   Info noted and sent to [facility] CAN pool for call back.

## 2011-10-14 MED ORDER — LOSARTAN POTASSIUM-HCTZ 100-25 MG PO TABS: 1.0000 | ORAL_TABLET | Freq: Every day | ORAL | Status: DC

## 2011-10-14 NOTE — Telephone Encounter (Signed)
rx sent in electronically, left instructions on pts machine

## 2011-10-14 NOTE — Telephone Encounter (Signed)
Discontinue lisinopril Start  generic Hyzaar 100/25  #90. One daily

## 2011-10-28 ENCOUNTER — Encounter: Payer: Self-pay | Admitting: *Deleted

## 2011-10-28 ENCOUNTER — Encounter: Payer: Medicare Other | Attending: Internal Medicine | Admitting: *Deleted

## 2011-10-28 VITALS — Ht 64.0 in | Wt 197.1 lb

## 2011-10-28 DIAGNOSIS — E119 Type 2 diabetes mellitus without complications: Secondary | ICD-10-CM | POA: Diagnosis not present

## 2011-10-28 DIAGNOSIS — Z713 Dietary counseling and surveillance: Secondary | ICD-10-CM | POA: Diagnosis not present

## 2011-10-28 NOTE — Progress Notes (Signed)
Medical Nutrition Therapy:  Appt start time: 1530   End time:  1600.  Primary concerns today: T2DM - Follow up. Leslie Benton returns today for monthly f/u. Was on hiatus from gym, but returned this week. Husband's condescending nature continues to take it's toll on her emotionally, causing her to make poor food choices. States she needs "someone to talk to" and notes that she may start seeing Lurena Joiner, a trainer at her gym. Daily intake is inconsistent and meal skipping continues at times. Reports recent FBGs of 131, 161, and 167 mg. Discussed watching CHO intake at meals and adding protein to CHO at all times.  Lab Results  Component Value Date   HGBA1C 7.1* 07/01/2011   MEDICATIONS:  See medication list, reconciled with patient.  Recent physical activity:  Returned to the gym; plans to go 2-3 days per week as previously.   Estimated needs:  1200-1300 calories  135-145 g carbohydrates  75-80 g protein  40-45 g fat   Progress Towards Goal(s):  In progress.  Nutritional Diagnosis:  [place]-2.1 Impaired nutrient utilization related to excessive CHO intake and disordered eating pattern as evidenced by most recent A1c of 6.7% and patient reported CHO intake.   Intervention/Goals:   Continue previous goals.  No meal skipping.  Limit sugar-sweetened beverages, concentrated sweets, refined carbs (ex: potato chips, etc), and night snacking.   Monitoring/Evaluation:  Dietary intake, exercise, A1c (as available), BG trends, and body weight in 4 week(s).

## 2011-10-28 NOTE — Patient Instructions (Signed)
Goals:   Continue previous goals.  No meal skipping.  Limit sugar-sweetened beverages, concentrated sweets, refined carbs (ex: potato chips, etc), and night snacking.

## 2011-11-18 ENCOUNTER — Encounter: Payer: Self-pay | Admitting: Internal Medicine

## 2011-11-18 ENCOUNTER — Ambulatory Visit (INDEPENDENT_AMBULATORY_CARE_PROVIDER_SITE_OTHER): Payer: Medicare Other | Admitting: Internal Medicine

## 2011-11-18 VITALS — BP 140/88 | Wt 200.0 lb

## 2011-11-18 DIAGNOSIS — I10 Essential (primary) hypertension: Secondary | ICD-10-CM | POA: Diagnosis not present

## 2011-11-18 DIAGNOSIS — E119 Type 2 diabetes mellitus without complications: Secondary | ICD-10-CM

## 2011-11-18 LAB — HEMOGLOBIN A1C: Hgb A1c MFr Bld: 6.7 % — ABNORMAL HIGH (ref 4.6–6.5)

## 2011-11-18 NOTE — Progress Notes (Signed)
  Subjective:    Patient ID: Leslie Benton, female    DOB: [birth year], 71 y.o.   MRN: [id number]  HPI  71 year old patient who is seen today for followup of her hypertension and diabetes. She is doing fairly well. Does have a history of some mild fatigue but in general things are doing fairly well. Last hemoglobin A1c was up slightly to 7.1. Her weight is basically unchanged    Review of Systems  Constitutional: Positive for fatigue.  HENT: Negative for hearing loss, congestion, sore throat, rhinorrhea, dental problem, sinus pressure and tinnitus.   Eyes: Negative for pain, discharge and visual disturbance.  Respiratory: Negative for cough and shortness of breath.   Cardiovascular: Negative for chest pain, palpitations and leg swelling.  Gastrointestinal: Negative for nausea, vomiting, abdominal pain, diarrhea, constipation, blood in stool and abdominal distention.  Genitourinary: Negative for dysuria, urgency, frequency, hematuria, flank pain, vaginal bleeding, vaginal discharge, difficulty urinating, vaginal pain and pelvic pain.  Musculoskeletal: Negative for joint swelling, arthralgias and gait problem.  Skin: Negative for rash.  Neurological: Positive for headaches. Negative for dizziness, syncope, speech difficulty, weakness and numbness.  Hematological: Negative for adenopathy.  Psychiatric/Behavioral: Negative for behavioral problems, dysphoric mood and agitation. The patient is not nervous/anxious.        Objective:   Physical Exam  Constitutional: She is oriented to person, place, and time. She appears well-developed and well-nourished.       130/80  HENT:  Head: Normocephalic.  Right Ear: External ear normal.  Left Ear: External ear normal.  Mouth/Throat: Oropharynx is clear and moist.  Eyes: Conjunctivae and EOM are normal. Pupils are equal, round, and reactive to light.  Neck: Normal range of motion. Neck supple. No thyromegaly present.  Cardiovascular: Normal rate,  regular rhythm, normal heart sounds and intact distal pulses.   Pulmonary/Chest: Effort normal and breath sounds normal.  Abdominal: Soft. Bowel sounds are normal. She exhibits no mass. There is no tenderness.  Musculoskeletal: Normal range of motion.  Lymphadenopathy:    She has no cervical adenopathy.  Neurological: She is alert and oriented to person, place, and time.  Skin: Skin is warm and dry. No rash noted.  Psychiatric: She has a normal mood and affect. Her behavior is normal.          Assessment & Plan:   Diabetes mellitus. Will check a hemoglobin A1c Hypertension well controlled  Diet and weight loss encouraged  Recheck 3 months

## 2011-11-18 NOTE — Patient Instructions (Signed)
It is important that you exercise regularly, at least 20 minutes 3 to 4 times per week.  If you develop chest pain or shortness of breath seek  medical attention.  Limit your sodium (Salt) intake  You need to lose weight.  Consider a lower calorie diet and regular exercise.   

## 2011-11-19 ENCOUNTER — Telehealth: Payer: Self-pay

## 2011-11-19 MED ORDER — SERTRALINE HCL 100 MG PO TABS: 100.0000 mg | ORAL_TABLET | Freq: Every day | ORAL | Status: DC

## 2011-11-19 NOTE — Telephone Encounter (Signed)
Increase Zoloft to 100 mg daily. Okay to call in new prescription #90 refill x4

## 2011-11-19 NOTE — Telephone Encounter (Signed)
Fax from [facility] - pt indicated increase in dose on zoloft - please advise- med list states 50mg  qd

## 2011-12-02 ENCOUNTER — Ambulatory Visit: Payer: Medicare Other | Admitting: Internal Medicine

## 2011-12-06 ENCOUNTER — Encounter: Payer: Medicare Other | Admitting: *Deleted

## 2011-12-06 NOTE — Progress Notes (Signed)
Error

## 2011-12-16 ENCOUNTER — Encounter: Payer: Medicare Other | Attending: Internal Medicine | Admitting: *Deleted

## 2011-12-16 ENCOUNTER — Encounter: Payer: Self-pay | Admitting: *Deleted

## 2011-12-16 DIAGNOSIS — E119 Type 2 diabetes mellitus without complications: Secondary | ICD-10-CM | POA: Insufficient documentation

## 2011-12-16 DIAGNOSIS — Z713 Dietary counseling and surveillance: Secondary | ICD-10-CM | POA: Diagnosis not present

## 2011-12-16 NOTE — Patient Instructions (Addendum)
Goals:   Continue previous goals.  No meal skipping.  Limit sugar-sweetened beverages, concentrated sweets, refined carbs (ex: potato chips, etc), and night snacking.  Recommend referral to Dr. Norlene Campbell at [facility] for rotator cuff.  Resume exercise as able and check blood glucose as instructed by your doctor.
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
## 2011-12-29 ENCOUNTER — Other Ambulatory Visit: Payer: Self-pay | Admitting: Internal Medicine

## 2012-01-06 DIAGNOSIS — Z23 Encounter for immunization: Secondary | ICD-10-CM | POA: Diagnosis not present

## 2012-01-13 ENCOUNTER — Telehealth: Payer: Self-pay | Admitting: Internal Medicine

## 2012-01-13 ENCOUNTER — Encounter: Payer: Self-pay | Admitting: *Deleted

## 2012-01-13 ENCOUNTER — Encounter: Payer: Medicare Other | Attending: Internal Medicine | Admitting: *Deleted

## 2012-01-13 DIAGNOSIS — Z713 Dietary counseling and surveillance: Secondary | ICD-10-CM | POA: Insufficient documentation

## 2012-01-13 DIAGNOSIS — E119 Type 2 diabetes mellitus without complications: Secondary | ICD-10-CM | POA: Diagnosis not present

## 2012-01-13 NOTE — Telephone Encounter (Signed)
Call was forwarded to me by scheduler who was unable to reason with pt who is demanding to speak with Dr. Kirtland Bouchard about a billing issue, refused to leave a message, refused to speak with anybody else.  I spoke with Mrs. Napoles who seems to be overly addiment that she can only speak to Dr. Kirtland Bouchard about her issue (she would not tell me the issue) because her friend had a similar issue and told her to not speak with anybody other than Dr. Kirtland Bouchard.  I advised the patient that Dr. Kirtland Bouchard could not come to phone due to seeing patients but I would be more than happy to send a message or either schedule and appt.  The patient began to ramble on about workers that would be coming to her house soon and her not being able to use her cell phone in her house then she hung up.

## 2012-01-13 NOTE — Progress Notes (Signed)
Medical Nutrition Therapy:  Appt start time: 1515   End time:  1545.  Primary concerns today: T2DM - Follow up. Collie returns today for monthly f/u and c/o issues with [facility] billing dept. Has the "blahs". Has not contacted MD re: rotator cuff because she is trying to resolve billing issue.  Reports she is trying to drink more water. Reports an episode of hyperglycemia (BG of 73 mg) a few weeks ago. Suspect this is d/t disordered eating pattern she has not been able to break. She sometimes consumes 3 meals/day and sometimes not. Reports being very tired and sluggish. ? If deficient in B12 or Vit D.  Recommend drawing labs to check.  Lab Results  Component Value Date   HGBA1C 6.7* 11/18/2011   MEDICATIONS:  See medication list, reconciled with patient.  Recent physical activity:  All exercise "hurts something and makes me tired". Starts membership back on Oct 1st.    Estimated needs:  1200-1300 calories  135-145 g carbohydrates  75-80 g protein  40-45 g fat   Progress Towards Goal(s):  In progress.  Nutritional Diagnosis:  [place]-2.1 Impaired nutrient utilization related to excessive CHO intake and disordered eating pattern as evidenced by most recent A1c of 6.7% and patient reported CHO intake.   Intervention/Goals:   Continue previous goals.  No meal skipping.  Limit sugar-sweetened beverages, concentrated sweets, refined carbs (ex: potato chips, etc), and night snacking.  Recommend referral to Dr. Norlene Campbell at [facility] for rotator cuff.  Resume exercise as able and check blood glucose as instructed by your doctor.  Monitoring/Evaluation:  Dietary intake, exercise, A1c (as available), BG trends, and body weight in 4-6 week(s).

## 2012-01-13 NOTE — Patient Instructions (Addendum)
Goals:   Continue previous goals.  No meal skipping.  Limit sugar-sweetened beverages, concentrated sweets, refined carbs (ex: potato chips, etc), and night snacking.  Recommend referral to Dr. Norlene Campbell at [facility] for rotator cuff.  Resume exercise as able and check blood glucose as instructed by your doctor.  Recommend labs to check B-12 and Vit D status.

## 2012-01-21 ENCOUNTER — Other Ambulatory Visit: Payer: Self-pay | Admitting: Internal Medicine

## 2012-01-31 DIAGNOSIS — Z1231 Encounter for screening mammogram for malignant neoplasm of breast: Secondary | ICD-10-CM | POA: Diagnosis not present

## 2012-01-31 DIAGNOSIS — Z124 Encounter for screening for malignant neoplasm of cervix: Secondary | ICD-10-CM | POA: Diagnosis not present

## 2012-02-04 ENCOUNTER — Other Ambulatory Visit: Payer: Self-pay | Admitting: Internal Medicine

## 2012-02-11 ENCOUNTER — Other Ambulatory Visit: Payer: Self-pay | Admitting: Internal Medicine

## 2012-02-21 ENCOUNTER — Encounter: Payer: Medicare Other | Attending: Internal Medicine | Admitting: *Deleted

## 2012-02-21 DIAGNOSIS — E119 Type 2 diabetes mellitus without complications: Secondary | ICD-10-CM

## 2012-02-21 DIAGNOSIS — Z713 Dietary counseling and surveillance: Secondary | ICD-10-CM | POA: Insufficient documentation

## 2012-02-21 NOTE — Patient Instructions (Addendum)
Goals:   Continue previous goals.  No meal skipping.  Limit sugar-sweetened beverages, concentrated sweets, refined carbs (ex: potato chips, etc), and night snacking.  Recommend referral to Dr. Norlene Campbell at [facility] for rotator cuff.  Resume exercise as able and check blood glucose as instructed by your doctor.  Recommend labs to check B-12 and Vit D status.   Daily Estimated Needs:  1200-1300 calories  135-145 g carbohydrates (30g per meal; 15g per snack) 75-80 g protein  40-45 g fat
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
## 2012-02-24 ENCOUNTER — Encounter: Payer: Self-pay | Admitting: Internal Medicine

## 2012-02-24 ENCOUNTER — Ambulatory Visit (INDEPENDENT_AMBULATORY_CARE_PROVIDER_SITE_OTHER): Payer: Medicare Other | Admitting: Internal Medicine

## 2012-02-24 VITALS — BP 110/70 | Temp 97.7°F | Wt 207.0 lb

## 2012-02-24 DIAGNOSIS — E119 Type 2 diabetes mellitus without complications: Secondary | ICD-10-CM | POA: Diagnosis not present

## 2012-02-24 DIAGNOSIS — I10 Essential (primary) hypertension: Secondary | ICD-10-CM | POA: Diagnosis not present

## 2012-02-24 LAB — GLUCOSE, POCT (MANUAL RESULT ENTRY): POC Glucose: 95 mg/dl (ref 70–99)

## 2012-02-24 NOTE — Patient Instructions (Signed)
Limit your sodium (Salt) intake    It is important that you exercise regularly, at least 20 minutes 3 to 4 times per week.  If you develop chest pain or shortness of breath seek  medical attention.   Please check your hemoglobin A1c every 3 months   

## 2012-02-24 NOTE — Progress Notes (Signed)
Subjective:    Patient ID: Leslie Benton, female    DOB: 1940-05-06, 71 y.o.   MRN: [id number]  HPI  71 year old patient who is seen today for followup. She has treated hypertension and diabetes. Random blood sugar this afternoon 95. Last hemoglobin A1c was well controlled. She has some situational stressors but in general doing reasonably well without focal complaints.  Past Medical History  Diagnosis Date  . COLONIC POLYPS, HX OF 02/07/2008  . DIABETES MELLITUS, TYPE II 10/27/2009  . GERD 10/14/2006  . HYPERLIPIDEMIA 10/14/2006  . HYPERTENSION 10/14/2006  . HYPOTHYROIDISM 10/14/2006  . IMPAIRED GLUCOSE TOLERANCE 01/06/2009  . KNEE PAIN, RIGHT 07/15/2008  . LOW BACK PAIN 08/04/2007  . Obesity   . DEGENERATIVE JOINT DISEASE 10/14/2006    03/31/11: Pt states arthritis in her hip.    History   Social History  . Marital Status: Married    Spouse Name: N/A    Number of Children: N/A  . Years of Education: N/A   Occupational History  . Not on file.   Social History Main Topics  . Smoking status: Never Smoker   . Smokeless tobacco: Never Used  . Alcohol Use: No  . Drug Use: No  . Sexually Active: Not on file   Other Topics Concern  . Not on file   Social History Narrative  . No narrative on file    Past Surgical History  Procedure Date  . Hemorrhoid surgery   . Tonsillectomy     Family History  Problem Relation Age of Onset  . Heart disease Father     Allergies  Allergen Reactions  . Niacin And Related Hives and Swelling    No anaphalaxis, but strong reactions.  . Lactose Intolerance (Gi) Diarrhea  . Amoxicillin     REACTION: unspecified  . Penicillins     Current Outpatient Prescriptions on File Prior to Visit  Medication Sig Dispense Refill  . busPIRone (BUSPAR) 15 MG tablet Take 1 tablet (15 mg total) by mouth 2 (two) times daily as needed.  60 tablet  6  . Cholecalciferol (VITAMIN D-3 PO) Take by mouth.      . diclofenac (VOLTAREN) 50 MG EC tablet Take 50  mg by mouth 3 (three) times daily as needed.      Marland Kitchen glimepiride (AMARYL) 4 MG tablet Take 0.5 tablets (2 mg total) by mouth daily before breakfast.  90 tablet  6  . glucose blood (ACCU-CHEK AVIVA) test strip Use daily as instructed  100 each  6  . Lancets (ACCU-CHEK SOFT TOUCH) lancets Use as instructed  100 each  6  . levothyroxine (SYNTHROID) 125 MCG tablet Take 1 tablet (125 mcg total) by mouth daily.  90 tablet  3  . levothyroxine (SYNTHROID, LEVOTHROID) 125 MCG tablet take 1 tablet by mouth once daily  90 tablet  3  . losartan-hydrochlorothiazide (HYZAAR) 100-25 MG per tablet Take 1 tablet by mouth daily.  90 tablet  3  . metFORMIN (GLUCOPHAGE) 1000 MG tablet Take 1 tablet (1,000 mg total) by mouth 2 (two) times daily with a meal.  180 tablet  6  . omeprazole (PRILOSEC) 20 MG capsule Take 1 capsule (20 mg total) by mouth daily.  90 capsule  6  . phentermine (ADIPEX-P) 37.5 MG tablet TAKE 1 TABLET BY MOUTH ONCE DAILY BEFORE BREAKFAST  30 tablet  3  . propranolol (INDERAL) 20 MG tablet Take 1 tablet (20 mg total) by mouth 2 (two) times daily.  180 tablet  6  . sertraline (ZOLOFT) 100 MG tablet Take 1 tablet (100 mg total) by mouth daily.  90 tablet  3  . sertraline (ZOLOFT) 50 MG tablet take 1 tablet by mouth once daily  90 tablet  3  . simvastatin (ZOCOR) 40 MG tablet take 1 tablet by mouth once daily IN THE EVENING FOR CHOLESTROL  90 tablet  1    BP 110/70  Temp 97.7 F (36.5 C) (Oral)  Wt 207 lb (93.895 kg)     Review of Systems  Constitutional: Negative.   HENT: Negative for hearing loss, congestion, sore throat, rhinorrhea, dental problem, sinus pressure and tinnitus.   Eyes: Negative for pain, discharge and visual disturbance.  Respiratory: Negative for cough and shortness of breath.   Cardiovascular: Negative for chest pain, palpitations and leg swelling.  Gastrointestinal: Negative for nausea, vomiting, abdominal pain, diarrhea, constipation, blood in stool and abdominal  distention.  Genitourinary: Negative for dysuria, urgency, frequency, hematuria, flank pain, vaginal bleeding, vaginal discharge, difficulty urinating, vaginal pain and pelvic pain.  Musculoskeletal: Negative for joint swelling, arthralgias and gait problem.  Skin: Negative for rash.  Neurological: Negative for dizziness, syncope, speech difficulty, weakness, numbness and headaches.  Hematological: Negative for adenopathy.  Psychiatric/Behavioral: Negative for behavioral problems, dysphoric mood and agitation. The patient is not nervous/anxious.        Objective:   Physical Exam  Constitutional: She is oriented to person, place, and time. She appears well-developed and well-nourished.  HENT:  Head: Normocephalic.  Right Ear: External ear normal.  Left Ear: External ear normal.  Mouth/Throat: Oropharynx is clear and moist.  Eyes: Conjunctivae normal and EOM are normal. Pupils are equal, round, and reactive to light.  Neck: Normal range of motion. Neck supple. No thyromegaly present.  Cardiovascular: Normal rate, regular rhythm, normal heart sounds and intact distal pulses.   Pulmonary/Chest: Effort normal and breath sounds normal.  Abdominal: Soft. Bowel sounds are normal. She exhibits no mass. There is no tenderness.  Musculoskeletal: Normal range of motion.  Lymphadenopathy:    She has no cervical adenopathy.  Neurological: She is alert and oriented to person, place, and time.  Skin: Skin is warm and dry. No rash noted.  Psychiatric: She has a normal mood and affect. Her behavior is normal.          Assessment & Plan:   Diabetes mellitus. Will check a hemoglobin A1c Hypertension stable Osteoarthritis no complaints today. Recheck 3 months Weight loss encouraged Patient remains on phentermine

## 2012-03-10 ENCOUNTER — Encounter (INDEPENDENT_AMBULATORY_CARE_PROVIDER_SITE_OTHER): Payer: Medicare Other | Admitting: Ophthalmology

## 2012-03-10 DIAGNOSIS — H2510 Age-related nuclear cataract, unspecified eye: Secondary | ICD-10-CM

## 2012-03-10 DIAGNOSIS — E1139 Type 2 diabetes mellitus with other diabetic ophthalmic complication: Secondary | ICD-10-CM

## 2012-03-10 DIAGNOSIS — E11319 Type 2 diabetes mellitus with unspecified diabetic retinopathy without macular edema: Secondary | ICD-10-CM

## 2012-03-10 DIAGNOSIS — H43819 Vitreous degeneration, unspecified eye: Secondary | ICD-10-CM

## 2012-03-10 DIAGNOSIS — I10 Essential (primary) hypertension: Secondary | ICD-10-CM

## 2012-03-10 DIAGNOSIS — H35039 Hypertensive retinopathy, unspecified eye: Secondary | ICD-10-CM

## 2012-03-22 ENCOUNTER — Ambulatory Visit: Payer: Medicare Other | Admitting: *Deleted

## 2012-03-30 ENCOUNTER — Encounter: Payer: Medicare Other | Attending: Internal Medicine | Admitting: *Deleted

## 2012-03-30 DIAGNOSIS — E119 Type 2 diabetes mellitus without complications: Secondary | ICD-10-CM

## 2012-03-30 DIAGNOSIS — Z713 Dietary counseling and surveillance: Secondary | ICD-10-CM | POA: Insufficient documentation

## 2012-03-30 NOTE — Patient Instructions (Addendum)
Goals:   Continue previous goals.  RESUME ZOLOFT/SERTRALINE  No meal skipping.  Limit sugar-sweetened beverages, concentrated sweets, refined carbs (ex: potato chips, etc), and night snacking.  Recommend referral to Dr. Norlene Campbell at [facility] for rotator cuff.  Resume exercise as able and check blood glucose as instructed by your doctor.  Recommend labs to check B-12 and Vit D status.   Daily Estimated Needs:  1200-1300 calories  135-145 g carbohydrates (30g per meal; 15g per snack) 75-80 g protein  40-45 g fat

## 2012-03-30 NOTE — Progress Notes (Signed)
Medical Nutrition Therapy:  Appt start time: 1545   End time:  1605.  Primary concerns today: T2DM - Follow up. Venda returns today 15 min late for monthly f/u. Refused again to weigh today and states she wants to start over in the new year.  Also reports she is not checking BGs and feels "bad all the time".  Continues to struggle with stress, depression, and issues with husband.  Once again urged her to seek therapy and gave her the number of Su Ley, psychologist in private practice.   Lab Results  Component Value Date   HGBA1C 7.1* 02/24/2012   MEDICATIONS:  Has not been taking Zoloft last several days  Recent physical activity:  Has not been to gym because she is so tired by the time she finishes everything at home.     Estimated needs:  1200-1300 calories  135-145 g carbohydrates  75-80 g protein  40-45 g fat   Progress Towards Goal(s):  In progress.  Nutritional Diagnosis:  [place]-2.1 Impaired nutrient utilization related to excessive CHO intake and disordered eating pattern as evidenced by most recent A1c of 6.7% and patient reported CHO intake.   Intervention/Goals:   Continue previous goals.  No meal skipping.  Limit sugar-sweetened beverages, concentrated sweets, refined carbs (ex: potato chips, etc), and night snacking.  Recommend referral to Dr. Norlene Campbell at [facility] for rotator cuff.  Resume exercise as able and check blood glucose as instructed by your doctor.  Monitoring/Evaluation:  Dietary intake, exercise, A1c (as available), BG trends, and body weight in 4-6 week(s).

## 2012-04-13 ENCOUNTER — Encounter: Payer: Self-pay | Admitting: Internal Medicine

## 2012-04-27 ENCOUNTER — Ambulatory Visit: Payer: Medicare Other | Admitting: *Deleted

## 2012-06-02 ENCOUNTER — Ambulatory Visit: Payer: Medicare Other | Admitting: Internal Medicine

## 2012-06-08 ENCOUNTER — Encounter: Payer: Self-pay | Admitting: Internal Medicine

## 2012-06-08 ENCOUNTER — Ambulatory Visit (INDEPENDENT_AMBULATORY_CARE_PROVIDER_SITE_OTHER): Payer: Medicare Other | Admitting: Internal Medicine

## 2012-06-08 VITALS — BP 160/90 | HR 80 | Temp 98.1°F | Resp 20 | Wt 209.0 lb

## 2012-06-08 DIAGNOSIS — F329 Major depressive disorder, single episode, unspecified: Secondary | ICD-10-CM | POA: Insufficient documentation

## 2012-06-08 DIAGNOSIS — F32A Depression, unspecified: Secondary | ICD-10-CM | POA: Insufficient documentation

## 2012-06-08 DIAGNOSIS — E039 Hypothyroidism, unspecified: Secondary | ICD-10-CM

## 2012-06-08 DIAGNOSIS — E119 Type 2 diabetes mellitus without complications: Secondary | ICD-10-CM

## 2012-06-08 NOTE — Patient Instructions (Signed)
BuSpar twice daily  Behavioral health referral as discussed
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
## 2012-06-09 ENCOUNTER — Telehealth: Payer: Self-pay | Admitting: Internal Medicine

## 2012-06-09 NOTE — Telephone Encounter (Signed)
Patient called stating that she would like the MD to know that part of her anxiety was weather related and also when it rains her arthritis flares up.

## 2012-06-25 ENCOUNTER — Other Ambulatory Visit: Payer: Self-pay | Admitting: Internal Medicine

## 2012-06-26 DIAGNOSIS — M545 Low back pain: Secondary | ICD-10-CM | POA: Diagnosis not present

## 2012-06-26 DIAGNOSIS — M48061 Spinal stenosis, lumbar region without neurogenic claudication: Secondary | ICD-10-CM | POA: Diagnosis not present

## 2012-06-30 ENCOUNTER — Other Ambulatory Visit: Payer: Self-pay | Admitting: Orthopaedic Surgery

## 2012-06-30 DIAGNOSIS — M25562 Pain in left knee: Secondary | ICD-10-CM

## 2012-07-03 ENCOUNTER — Ambulatory Visit
Admission: RE | Admit: 2012-07-03 | Discharge: 2012-07-03 | Disposition: A | Discharge status: Home / Self Care | Payer: Medicare Other | Source: Ambulatory Visit | Attending: Orthopaedic Surgery | Admitting: Orthopaedic Surgery

## 2012-07-03 DIAGNOSIS — M25469 Effusion, unspecified knee: Secondary | ICD-10-CM | POA: Diagnosis not present

## 2012-07-03 DIAGNOSIS — M25562 Pain in left knee: Secondary | ICD-10-CM

## 2012-07-03 DIAGNOSIS — M1710 Unilateral primary osteoarthritis, unspecified knee: Secondary | ICD-10-CM | POA: Diagnosis not present

## 2012-07-10 DIAGNOSIS — M23329 Other meniscus derangements, posterior horn of medial meniscus, unspecified knee: Secondary | ICD-10-CM | POA: Diagnosis not present

## 2012-07-18 HISTORY — PX: OTHER SURGICAL HISTORY: SHX169

## 2012-07-24 ENCOUNTER — Telehealth: Payer: Self-pay | Admitting: Internal Medicine

## 2012-07-24 NOTE — Telephone Encounter (Signed)
Please disregard message. Pt said Dr Cleophas Dunker office is taking care of this

## 2012-07-24 NOTE — Telephone Encounter (Signed)
Patient called stating that she would like to have an rx for a bed side commode called into [facility] ph. [phone]. Please assist.

## 2012-08-03 DIAGNOSIS — M659 Synovitis and tenosynovitis, unspecified: Secondary | ICD-10-CM | POA: Diagnosis not present

## 2012-08-03 DIAGNOSIS — M9420 Chondromalacia, unspecified site: Secondary | ICD-10-CM | POA: Diagnosis not present

## 2012-08-03 DIAGNOSIS — M23329 Other meniscus derangements, posterior horn of medial meniscus, unspecified knee: Secondary | ICD-10-CM | POA: Diagnosis not present

## 2012-08-03 DIAGNOSIS — M23305 Other meniscus derangements, unspecified medial meniscus, unspecified knee: Secondary | ICD-10-CM | POA: Diagnosis not present

## 2012-08-03 DIAGNOSIS — M2240 Chondromalacia patellae, unspecified knee: Secondary | ICD-10-CM | POA: Diagnosis not present

## 2012-08-05 ENCOUNTER — Other Ambulatory Visit: Payer: Self-pay | Admitting: Internal Medicine

## 2012-08-08 ENCOUNTER — Other Ambulatory Visit: Payer: Self-pay | Admitting: Internal Medicine

## 2012-08-17 DIAGNOSIS — M23329 Other meniscus derangements, posterior horn of medial meniscus, unspecified knee: Secondary | ICD-10-CM | POA: Diagnosis not present

## 2012-08-17 DIAGNOSIS — M25569 Pain in unspecified knee: Secondary | ICD-10-CM | POA: Diagnosis not present

## 2012-08-18 ENCOUNTER — Telehealth: Payer: Self-pay | Admitting: Internal Medicine

## 2012-08-18 NOTE — Telephone Encounter (Signed)
Left ankle has tendency to swell on any given day.  Swelling since Left knee surgery 4/17 became larger then has lost back down to where it usually is.  Swelling now bothersome, does not go down even when leg propped up.  Triaged in Ankle Non-injury Guideline - Disposition:  See Provider Within 24 Hours due to swelling of ankle that does not resolve with rest and elevation of legs.  Asking if medication can be called in to get jump start on the swelling.  Sees surgeon on Mon 5/5.

## 2012-08-21 ENCOUNTER — Telehealth: Payer: Self-pay | Admitting: Internal Medicine

## 2012-08-21 NOTE — Telephone Encounter (Signed)
[facility] Triage Call Report Triage Record Num: [id number] Operator: Revonda Humphrey Patient Name: Leslie Benton Call Date & Time: 08/18/2012 4:44:33PM Patient Phone: [phone] PCP: Gordy Savers Patient Gender: Female PCP Fax : [phone] Patient DOB: 1940-12-08 Practice Name: [facility] Reason for Call: Left ankle has tendency to swell on any given day. Swelling since Left knee surgery 4/17 became larger then has lost back down to where it usually is. Swelling now bothersome, does not go down even when leg propped up. Triaged in Ankle Non-injury Guideline - Disposition: See Provider Within 24 Hours due to swelling of ankle that does not resolve with rest and elevation of legs. Asking if medication can be called in to get jump start on the swelling. Sees surgeon on Mon 5/5. Note sent in EPIC. Protocol(s) Used: Ankle Non-Injury Recommended Outcome per Protocol: See Provider within 24 hours Reason for Outcome: New swelling of legs that does NOT resolve with rest and elevation of legs Care Advice: Position affected part so it is elevated at least 12 inches (30 cm) above level of heart to improve circulation and decrease discomfort. ~ LEG CARE: - Avoid prolonged sitting or standing; take a break to move around every hour or so. - Keep legs raised when sitting, resting or sleeping; when possible raise legs above level of the heart for 20 -30 minutes. - Flex and extend ankles for 10-12 repetitions every hour if sitting. - Do not cross your legs. - Wear loose, non-restrictive clothing, especially around waist, groin area and legs. - Consider using support hose if recommended by your provider. ~ 05/

## 2012-08-21 NOTE — Telephone Encounter (Signed)
Spoke to pt told her sorry I did not get back to her on Friday but was not in office and it would be best to let the surgeon evaluate her swelling instead of just giving medicine and you already have a diuretic in your blood pressure medicine. Pt verbalized understanding and has an appt this afternoon.

## 2012-08-21 NOTE — Telephone Encounter (Signed)
Already spoke to pt, has an appt with provider who did surgery.

## 2012-08-23 DIAGNOSIS — M25569 Pain in unspecified knee: Secondary | ICD-10-CM | POA: Diagnosis not present

## 2012-08-23 DIAGNOSIS — M23329 Other meniscus derangements, posterior horn of medial meniscus, unspecified knee: Secondary | ICD-10-CM | POA: Diagnosis not present

## 2012-08-24 ENCOUNTER — Other Ambulatory Visit: Payer: Self-pay | Admitting: Internal Medicine

## 2012-08-25 DIAGNOSIS — M25569 Pain in unspecified knee: Secondary | ICD-10-CM | POA: Diagnosis not present

## 2012-08-25 DIAGNOSIS — M23329 Other meniscus derangements, posterior horn of medial meniscus, unspecified knee: Secondary | ICD-10-CM | POA: Diagnosis not present

## 2012-08-28 DIAGNOSIS — M23329 Other meniscus derangements, posterior horn of medial meniscus, unspecified knee: Secondary | ICD-10-CM | POA: Diagnosis not present

## 2012-08-28 DIAGNOSIS — M25569 Pain in unspecified knee: Secondary | ICD-10-CM | POA: Diagnosis not present

## 2012-08-30 DIAGNOSIS — M25569 Pain in unspecified knee: Secondary | ICD-10-CM | POA: Diagnosis not present

## 2012-09-01 DIAGNOSIS — M9420 Chondromalacia, unspecified site: Secondary | ICD-10-CM | POA: Diagnosis not present

## 2012-09-01 DIAGNOSIS — M23329 Other meniscus derangements, posterior horn of medial meniscus, unspecified knee: Secondary | ICD-10-CM | POA: Diagnosis not present

## 2012-09-04 DIAGNOSIS — M25569 Pain in unspecified knee: Secondary | ICD-10-CM | POA: Diagnosis not present

## 2012-09-04 DIAGNOSIS — M23329 Other meniscus derangements, posterior horn of medial meniscus, unspecified knee: Secondary | ICD-10-CM | POA: Diagnosis not present

## 2012-09-06 DIAGNOSIS — M23329 Other meniscus derangements, posterior horn of medial meniscus, unspecified knee: Secondary | ICD-10-CM | POA: Diagnosis not present

## 2012-09-06 DIAGNOSIS — M25569 Pain in unspecified knee: Secondary | ICD-10-CM | POA: Diagnosis not present

## 2012-09-07 ENCOUNTER — Encounter: Payer: Self-pay | Admitting: Internal Medicine

## 2012-09-07 ENCOUNTER — Ambulatory Visit (INDEPENDENT_AMBULATORY_CARE_PROVIDER_SITE_OTHER): Payer: Medicare Other | Admitting: Internal Medicine

## 2012-09-07 VITALS — BP 120/80 | Temp 97.4°F

## 2012-09-07 DIAGNOSIS — E119 Type 2 diabetes mellitus without complications: Secondary | ICD-10-CM

## 2012-09-07 DIAGNOSIS — F329 Major depressive disorder, single episode, unspecified: Secondary | ICD-10-CM

## 2012-09-07 DIAGNOSIS — E039 Hypothyroidism, unspecified: Secondary | ICD-10-CM | POA: Diagnosis not present

## 2012-09-07 DIAGNOSIS — M1990 Unspecified osteoarthritis, unspecified site: Secondary | ICD-10-CM | POA: Diagnosis not present

## 2012-09-07 NOTE — Progress Notes (Signed)
Subjective:    Patient ID: Leslie Benton, female    DOB: [birth year], 72 y.o.   MRN: [id number]  HPI  72 year old patient who is seen today for followup of hypertension dyslipidemia and type 2 diabetes. Since her last visit here she has had left knee arthroscopic surgery. She is still having some pain and is being followed closely by orthopedics. She has a history of anxiety depression which seems a better today she has treated hypothyroidism.  Past Medical History  Diagnosis Date  . COLONIC POLYPS, HX OF 02/07/2008  . DIABETES MELLITUS, TYPE II 10/27/2009  . GERD 10/14/2006  . HYPERLIPIDEMIA 10/14/2006  . HYPERTENSION 10/14/2006  . HYPOTHYROIDISM 10/14/2006  . IMPAIRED GLUCOSE TOLERANCE 01/06/2009  . KNEE PAIN, RIGHT 07/15/2008  . LOW BACK PAIN 08/04/2007  . Obesity   . DEGENERATIVE JOINT DISEASE 10/14/2006    03/31/11: Pt states arthritis in her hip.    History   Social History  . Marital Status: Married    Spouse Name: N/A    Number of Children: N/A  . Years of Education: N/A   Occupational History  . Not on file.   Social History Main Topics  . Smoking status: Never Smoker   . Smokeless tobacco: Never Used  . Alcohol Use: No  . Drug Use: No  . Sexually Active: Not on file   Other Topics Concern  . Not on file   Social History Narrative  . No narrative on file    Past Surgical History  Procedure Laterality Date  . Hemorrhoid surgery    . Tonsillectomy      Family History  Problem Relation Age of Onset  . Heart disease Father     Allergies  Allergen Reactions  . Niacin And Related Hives and Swelling    No anaphalaxis, but strong reactions.  . Lactose Intolerance (Gi) Diarrhea  . Amoxicillin     REACTION: unspecified  . Penicillins     Current Outpatient Prescriptions on File Prior to Visit  Medication Sig Dispense Refill  . busPIRone (BUSPAR) 15 MG tablet Take 1 tablet (15 mg total) by mouth 2 (two) times daily as needed.  60 tablet  6  .  Cholecalciferol (VITAMIN D-3 PO) Take by mouth.      . diclofenac (VOLTAREN) 50 MG EC tablet Take 50 mg by mouth 3 (three) times daily as needed.      Marland Kitchen glimepiride (AMARYL) 4 MG tablet Take 0.5 tablets (2 mg total) by mouth daily before breakfast.  90 tablet  6  . glucose blood (ACCU-CHEK AVIVA) test strip Use daily as instructed  100 each  6  . Lancets (ACCU-CHEK MULTICLIX) lancets use once daily and if needed  102 each  3  . levothyroxine (SYNTHROID, LEVOTHROID) 125 MCG tablet take 1 tablet by mouth once daily  90 tablet  3  . losartan-hydrochlorothiazide (HYZAAR) 100-25 MG per tablet Take 1 tablet by mouth daily.  90 tablet  3  . metFORMIN (GLUCOPHAGE) 1000 MG tablet take 1 tablet by mouth twice a day WITH MEAL  180 tablet  1  . omeprazole (PRILOSEC) 20 MG capsule Take 1 capsule (20 mg total) by mouth daily.  90 capsule  6  . phentermine (ADIPEX-P) 37.5 MG tablet take 1 tablet by mouth every morning  30 tablet  3  . propranolol (INDERAL) 20 MG tablet Take 1 tablet (20 mg total) by mouth 2 (two) times daily.  180 tablet  6  . simvastatin (ZOCOR) 40 MG  tablet TAKE 1 TABLET BY MOUTH ONCE EVERY EVENING FOR CHOLESTEROL  90 tablet  1   No current facility-administered medications on file prior to visit.    BP 120/80  Temp(Src) 97.4 F (36.3 C) (Oral)       Review of Systems  Constitutional: Negative.   HENT: Negative for hearing loss, congestion, sore throat, rhinorrhea, dental problem, sinus pressure and tinnitus.   Eyes: Negative for pain, discharge and visual disturbance.  Respiratory: Negative for cough and shortness of breath.   Cardiovascular: Negative for chest pain, palpitations and leg swelling.  Gastrointestinal: Negative for nausea, vomiting, abdominal pain, diarrhea, constipation, blood in stool and abdominal distention.  Genitourinary: Negative for dysuria, urgency, frequency, hematuria, flank pain, vaginal bleeding, vaginal discharge, difficulty urinating, vaginal pain and  pelvic pain.  Musculoskeletal: Positive for joint swelling, arthralgias and gait problem.  Skin: Negative for rash.  Neurological: Negative for dizziness, syncope, speech difficulty, weakness, numbness and headaches.  Hematological: Negative for adenopathy.  Psychiatric/Behavioral: Negative for behavioral problems, dysphoric mood and agitation. The patient is not nervous/anxious.        Objective:   Physical Exam  Constitutional: She is oriented to person, place, and time. She appears well-developed and well-nourished.  HENT:  Head: Normocephalic.  Right Ear: External ear normal.  Left Ear: External ear normal.  Mouth/Throat: Oropharynx is clear and moist.  Eyes: Conjunctivae and EOM are normal. Pupils are equal, round, and reactive to light.  Neck: Normal range of motion. Neck supple. No thyromegaly present.  Cardiovascular: Normal rate, regular rhythm, normal heart sounds and intact distal pulses.   Pulmonary/Chest: Effort normal and breath sounds normal.  Abdominal: Soft. Bowel sounds are normal. She exhibits no mass. There is no tenderness.  Musculoskeletal: Normal range of motion.  Status post left knee arthroscopic surgery  Lymphadenopathy:    She has no cervical adenopathy.  Neurological: She is alert and oriented to person, place, and time.  Skin: Skin is warm and dry. No rash noted.  Psychiatric: She has a normal mood and affect. Her behavior is normal.          Assessment & Plan:   Diabetes mellitus. Will check a hemoglobin A1c Hypertension Dyslipidemia Hypothyroidism

## 2012-09-07 NOTE — Patient Instructions (Signed)
Please check your hemoglobin A1c every 3 months  Limit your sodium (Salt) intake   

## 2012-09-08 LAB — CBC WITH DIFFERENTIAL/PLATELET
Basophils Relative: 0.1 % (ref 0.0–3.0)
Eosinophils Absolute: 0.1 10*3/uL (ref 0.0–0.7)
Eosinophils Relative: 1.2 % (ref 0.0–5.0)
Hemoglobin: 15.4 g/dL — ABNORMAL HIGH (ref 12.0–15.0)
MCHC: 33.8 g/dL (ref 30.0–36.0)
MCV: 90.3 fl (ref 78.0–100.0)
Monocytes Absolute: 0.5 10*3/uL (ref 0.1–1.0)
Neutro Abs: 7.6 10*3/uL (ref 1.4–7.7)
RBC: 5.06 Mil/uL (ref 3.87–5.11)
WBC: 12.4 10*3/uL — ABNORMAL HIGH (ref 4.5–10.5)

## 2012-09-08 LAB — COMPREHENSIVE METABOLIC PANEL
AST: 17 U/L (ref 0–37)
Alkaline Phosphatase: 56 U/L (ref 39–117)
BUN: 33 mg/dL — ABNORMAL HIGH (ref 6–23)
Creatinine, Ser: 1.4 mg/dL — ABNORMAL HIGH (ref 0.4–1.2)
Total Bilirubin: 0.6 mg/dL (ref 0.3–1.2)

## 2012-09-13 DIAGNOSIS — M23329 Other meniscus derangements, posterior horn of medial meniscus, unspecified knee: Secondary | ICD-10-CM | POA: Diagnosis not present

## 2012-09-13 DIAGNOSIS — M25569 Pain in unspecified knee: Secondary | ICD-10-CM | POA: Diagnosis not present

## 2012-09-19 ENCOUNTER — Other Ambulatory Visit: Payer: Self-pay | Admitting: Internal Medicine

## 2012-09-19 DIAGNOSIS — M23329 Other meniscus derangements, posterior horn of medial meniscus, unspecified knee: Secondary | ICD-10-CM | POA: Diagnosis not present

## 2012-09-19 DIAGNOSIS — M25569 Pain in unspecified knee: Secondary | ICD-10-CM | POA: Diagnosis not present

## 2012-09-21 ENCOUNTER — Other Ambulatory Visit: Payer: Self-pay | Admitting: Internal Medicine

## 2012-09-28 DIAGNOSIS — M23329 Other meniscus derangements, posterior horn of medial meniscus, unspecified knee: Secondary | ICD-10-CM | POA: Diagnosis not present

## 2012-09-28 DIAGNOSIS — M25569 Pain in unspecified knee: Secondary | ICD-10-CM | POA: Diagnosis not present

## 2012-10-03 ENCOUNTER — Other Ambulatory Visit: Payer: Self-pay | Admitting: Internal Medicine

## 2012-10-05 DIAGNOSIS — M23329 Other meniscus derangements, posterior horn of medial meniscus, unspecified knee: Secondary | ICD-10-CM | POA: Diagnosis not present

## 2012-10-05 DIAGNOSIS — M25569 Pain in unspecified knee: Secondary | ICD-10-CM | POA: Diagnosis not present

## 2012-10-06 ENCOUNTER — Ambulatory Visit (INDEPENDENT_AMBULATORY_CARE_PROVIDER_SITE_OTHER): Payer: Medicare Other | Admitting: Internal Medicine

## 2012-10-06 ENCOUNTER — Encounter: Payer: Self-pay | Admitting: Internal Medicine

## 2012-10-06 VITALS — BP 140/90 | HR 100 | Temp 97.8°F | Resp 20 | Wt 203.0 lb

## 2012-10-06 DIAGNOSIS — E119 Type 2 diabetes mellitus without complications: Secondary | ICD-10-CM | POA: Diagnosis not present

## 2012-10-06 DIAGNOSIS — M1990 Unspecified osteoarthritis, unspecified site: Secondary | ICD-10-CM

## 2012-10-06 DIAGNOSIS — I10 Essential (primary) hypertension: Secondary | ICD-10-CM

## 2012-10-06 NOTE — Patient Instructions (Addendum)
Orthopedic follow-up as scheduled  Limit your sodium (Salt) intake   Please check your hemoglobin A1c every 3 months   

## 2012-10-06 NOTE — Progress Notes (Signed)
Subjective:    Patient ID: Leslie Benton, female    DOB: May 02, 1940, 72 y.o.   MRN: [id number]  HPI  72 year old patient who has type 2 diabetes. Her hemoglobin A1c has not been well-controlled-forced inactivity due to 2 left knee pain has been a factor. She is status post arthroscopic left knee surgery about 2 months ago. She continues to have pain and requires the use of a walker.  She is somewhat frustrated about her lack of progress.  She is considering self referral to another orthopedic specialist. She continues to receive physical therapy  Past Medical History  Diagnosis Date  . COLONIC POLYPS, HX OF 02/07/2008  . DIABETES MELLITUS, TYPE II 10/27/2009  . GERD 10/14/2006  . HYPERLIPIDEMIA 10/14/2006  . HYPERTENSION 10/14/2006  . HYPOTHYROIDISM 10/14/2006  . IMPAIRED GLUCOSE TOLERANCE 01/06/2009  . KNEE PAIN, RIGHT 07/15/2008  . LOW BACK PAIN 08/04/2007  . Obesity   . DEGENERATIVE JOINT DISEASE 10/14/2006    03/31/11: Pt states arthritis in her hip.    History   Social History  . Marital Status: Married    Spouse Name: N/A    Number of Children: N/A  . Years of Education: N/A   Occupational History  . Not on file.   Social History Main Topics  . Smoking status: Never Smoker   . Smokeless tobacco: Never Used  . Alcohol Use: No  . Drug Use: No  . Sexually Active: Not on file   Other Topics Concern  . Not on file   Social History Narrative  . No narrative on file    Past Surgical History  Procedure Laterality Date  . Hemorrhoid surgery    . Tonsillectomy      Family History  Problem Relation Age of Onset  . Heart disease Father     Allergies  Allergen Reactions  . Niacin And Related Hives and Swelling    No anaphalaxis, but strong reactions.  . Lactose Intolerance (Gi) Diarrhea  . Amoxicillin     REACTION: unspecified  . Penicillins     Current Outpatient Prescriptions on File Prior to Visit  Medication Sig Dispense Refill  . busPIRone (BUSPAR) 15 MG  tablet Take 1 tablet (15 mg total) by mouth 2 (two) times daily as needed.  60 tablet  6  . Cholecalciferol (VITAMIN D-3 PO) Take by mouth.      . diclofenac (VOLTAREN) 50 MG EC tablet Take 50 mg by mouth 3 (three) times daily as needed.      Marland Kitchen glimepiride (AMARYL) 4 MG tablet TAKE 1/2 A TABLET BY MOUTH DAILY BEFORE BREAKFAST  90 tablet  0  . glucose blood (ACCU-CHEK AVIVA) test strip Use daily as instructed  100 each  6  . Lancets (ACCU-CHEK MULTICLIX) lancets use once daily and if needed  102 each  3  . levothyroxine (SYNTHROID, LEVOTHROID) 125 MCG tablet take 1 tablet by mouth once daily  90 tablet  3  . losartan-hydrochlorothiazide (HYZAAR) 100-25 MG per tablet take 1 tablet by mouth once daily  90 tablet  1  . metFORMIN (GLUCOPHAGE) 1000 MG tablet take 1 tablet by mouth twice a day WITH MEAL  180 tablet  1  . omeprazole (PRILOSEC) 20 MG capsule Take 1 capsule (20 mg total) by mouth daily.  90 capsule  6  . phentermine (ADIPEX-P) 37.5 MG tablet take 1 tablet by mouth every morning  30 tablet  3  . propranolol (INDERAL) 20 MG tablet Take 1 tablet (20 mg  total) by mouth 2 (two) times daily.  180 tablet  6  . simvastatin (ZOCOR) 40 MG tablet TAKE 1 TABLET BY MOUTH ONCE EVERY EVENING FOR CHOLESTEROL  90 tablet  1  . traMADol (ULTRAM) 50 MG tablet take 1 tablet by mouth every 4 to 6 hours  30 tablet  2   No current facility-administered medications on file prior to visit.    BP 140/90  Pulse 100  Temp(Src) 97.8 F (36.6 C) (Oral)  Resp 20  Wt 203 lb (92.08 kg)  BMI 34.83 kg/m2  SpO2 97%       Review of Systems  Constitutional: Negative.   HENT: Negative for hearing loss, congestion, sore throat, rhinorrhea, dental problem, sinus pressure and tinnitus.   Eyes: Negative for pain, discharge and visual disturbance.  Respiratory: Negative for cough and shortness of breath.   Cardiovascular: Negative for chest pain, palpitations and leg swelling.  Gastrointestinal: Negative for nausea,  vomiting, abdominal pain, diarrhea, constipation, blood in stool and abdominal distention.  Genitourinary: Negative for dysuria, urgency, frequency, hematuria, flank pain, vaginal bleeding, vaginal discharge, difficulty urinating, vaginal pain and pelvic pain.  Musculoskeletal: Positive for arthralgias (left knee pain) and gait problem. Negative for joint swelling.  Skin: Negative for rash.  Neurological: Negative for dizziness, syncope, speech difficulty, weakness, numbness and headaches.  Hematological: Negative for adenopathy.  Psychiatric/Behavioral: Negative for behavioral problems, dysphoric mood and agitation. The patient is not nervous/anxious.        Objective:   Physical Exam  Constitutional: She appears well-developed and well-nourished. No distress.  Weight 203  Repeat blood pressure 134/84  Musculoskeletal:  Status post left arthroscopic knee surgery          Assessment & Plan:   Left knee pain Status post left knee arthroscopic surgery Osteoarthritis Diabetes mellitus. Weight loss encouraged we'll recheck in 2 months and followup a hemoglobin A1c at that time. Hopefully with improvement of her knee she will become more active with improvement in her glycemic control. No change in therapy at this time

## 2012-10-09 ENCOUNTER — Telehealth: Payer: Self-pay | Admitting: Internal Medicine

## 2012-10-09 MED ORDER — CELECOXIB 200 MG PO CAPS: 200.0000 mg | ORAL_CAPSULE | Freq: Every day | ORAL | Status: DC

## 2012-10-09 NOTE — Telephone Encounter (Signed)
Okay to call in a prescription for Celebrex but notify patient may be quite expensive. Called in #[phone]  200 mg one daily

## 2012-10-09 NOTE — Telephone Encounter (Signed)
Patient Information:  Caller Name: Ludene  Phone: [phone]  Patient: Leslie Benton, Leslie Benton  Gender: Female  DOB: 1941/03/10  Age: 72 Years  PCP: Eleonore Chiquito [facility])  Office Follow Up:  Does the office need to follow up with this patient?: Yes  Instructions For The Office: Requesting rx for Arthritis: Wants to try Celebrex  RN Note:  Uses [facility] on [address]Leave message if RX called in. She has hard time getting to phone d/t knee.   Symptoms  Reason For Call & Symptoms: Requesting Rx for Celebrex. Arthritis in knee and Tramadol is not helping. Spoke with friend who has had similar history with knees has had good effect with Celebrex. Wants to try before doing injections.  Reviewed Health History In EMR: Yes  Reviewed Medications In EMR: Yes  Reviewed Allergies In EMR: Yes  Reviewed Surgeries / Procedures: Yes  Date of Onset of Symptoms: 10/09/2012  Treatments Tried: Tramadol  Treatments Tried Worked: No  Guideline(s) Used:  No Protocol Available - Sick Adult  Disposition Per Guideline:   Home Care  Reason For Disposition Reached:   Patient's symptoms are safe to treat at home per nursing judgment  Advice Given:  Call Back If:  New symptoms develop  You become worse.  Patient Will Follow Care Advice:  YES

## 2012-10-09 NOTE — Telephone Encounter (Signed)
Left detailed message Rx for Celebrex 200 mg tablet one daily was sent to the pharmacy call if any questions.

## 2012-10-12 DIAGNOSIS — M23329 Other meniscus derangements, posterior horn of medial meniscus, unspecified knee: Secondary | ICD-10-CM | POA: Diagnosis not present

## 2012-10-12 DIAGNOSIS — M25569 Pain in unspecified knee: Secondary | ICD-10-CM | POA: Diagnosis not present

## 2012-10-18 DIAGNOSIS — M23329 Other meniscus derangements, posterior horn of medial meniscus, unspecified knee: Secondary | ICD-10-CM | POA: Diagnosis not present

## 2012-10-18 DIAGNOSIS — M25569 Pain in unspecified knee: Secondary | ICD-10-CM | POA: Diagnosis not present

## 2012-10-24 DIAGNOSIS — M23329 Other meniscus derangements, posterior horn of medial meniscus, unspecified knee: Secondary | ICD-10-CM | POA: Diagnosis not present

## 2012-10-24 DIAGNOSIS — M25569 Pain in unspecified knee: Secondary | ICD-10-CM | POA: Diagnosis not present

## 2012-10-31 ENCOUNTER — Telehealth: Payer: Self-pay | Admitting: Internal Medicine

## 2012-10-31 MED ORDER — PROPRANOLOL HCL 20 MG PO TABS: 20.0000 mg | ORAL_TABLET | Freq: Two times a day (BID) | ORAL | Status: DC

## 2012-10-31 NOTE — Telephone Encounter (Signed)
Spoke to pt told her refill for propranolol was sent to pharmacy and Dr. Amador Cunas said prophylactic antibiotic for this type of procedure is not indicated. Pt verbalized understanding.

## 2012-10-31 NOTE — Telephone Encounter (Signed)
Pt is having a syndisc injection on Friday am. Pt is having pain in the knee and would like to know if you think she should cancel this injection? It's a nagging pain.  She has been looking forward to this injection for some time now. Pt called Dr Cleophas Dunker, but could not get through. Pls advise.  Pt would also like a a refill of propranolol (INDERAL) 20 MG tablet  1/ BID. (Pt has had to take more often w/ her knee hurting). Pharm: [facility]/ [address]

## 2012-10-31 NOTE — Telephone Encounter (Signed)
Pt would like to request an antibiotic to take prior to this injection procedure. Advised pt to call the attending physician,(Dr Cleophas Dunker) and she said she did, but he is in surgery. Pls advise.

## 2012-10-31 NOTE — Telephone Encounter (Signed)
Prophylactic antibiotic for this type of procedure not indicated

## 2012-11-02 DIAGNOSIS — M25569 Pain in unspecified knee: Secondary | ICD-10-CM | POA: Diagnosis not present

## 2012-11-03 DIAGNOSIS — M1710 Unilateral primary osteoarthritis, unspecified knee: Secondary | ICD-10-CM | POA: Diagnosis not present

## 2012-11-09 DIAGNOSIS — M23329 Other meniscus derangements, posterior horn of medial meniscus, unspecified knee: Secondary | ICD-10-CM | POA: Diagnosis not present

## 2012-11-09 DIAGNOSIS — M25569 Pain in unspecified knee: Secondary | ICD-10-CM | POA: Diagnosis not present

## 2012-11-10 DIAGNOSIS — M1710 Unilateral primary osteoarthritis, unspecified knee: Secondary | ICD-10-CM | POA: Diagnosis not present

## 2012-11-11 ENCOUNTER — Other Ambulatory Visit: Payer: Self-pay | Admitting: Internal Medicine

## 2012-11-13 ENCOUNTER — Other Ambulatory Visit: Payer: Self-pay | Admitting: *Deleted

## 2012-11-13 MED ORDER — PHENTERMINE HCL 37.5 MG PO TABS: ORAL_TABLET | ORAL | Status: DC

## 2012-11-17 DIAGNOSIS — M1710 Unilateral primary osteoarthritis, unspecified knee: Secondary | ICD-10-CM | POA: Diagnosis not present

## 2012-11-23 DIAGNOSIS — M23329 Other meniscus derangements, posterior horn of medial meniscus, unspecified knee: Secondary | ICD-10-CM | POA: Diagnosis not present

## 2012-11-23 DIAGNOSIS — M25569 Pain in unspecified knee: Secondary | ICD-10-CM | POA: Diagnosis not present

## 2012-11-30 DIAGNOSIS — M1710 Unilateral primary osteoarthritis, unspecified knee: Secondary | ICD-10-CM | POA: Diagnosis not present

## 2012-11-30 DIAGNOSIS — M25569 Pain in unspecified knee: Secondary | ICD-10-CM | POA: Diagnosis not present

## 2012-12-04 ENCOUNTER — Telehealth: Payer: Self-pay | Admitting: Internal Medicine

## 2012-12-04 NOTE — Telephone Encounter (Signed)
I am not aware of this product. Is  she asking about Synvisc??

## 2012-12-04 NOTE — Telephone Encounter (Signed)
Please see message and advise 

## 2012-12-04 NOTE — Telephone Encounter (Signed)
Pt called stating that she would like an injection called Cimzia for her Arthritis and is requesting to be contacted about this. Pt also requesting a detailed message be left at there home number if she does not answer as it is hard for her to get tot he phone.

## 2012-12-05 NOTE — Telephone Encounter (Signed)
Spoke to pt told her Dr. Kirtland Bouchard is not aware if this product and wanted to know if you meant Synvisc? Pt stated no she already tried Synvisc and it did not help was told about this injection from a nurse. Told her not familiar with medication and asked her if she has seen a specialist for her arthritis. Pt stated no has been looking into it and wanted to know if Dr. Kirtland Bouchard new one. Told her will discuss with Dr. Kirtland Bouchard about a referral to a specialist that is more familiar with treatments and medications, and I will get back to her. Pt verbalized understanding.

## 2012-12-05 NOTE — Telephone Encounter (Signed)
Discussed with Dr. Kirtland Bouchard he said pt needs to see Ortho for her Osteoarthiritis that medication is for RA not appropriate for pt.  Called pt back explained to her that the medication that she is inquiring about is for RA and not appropriate for her she does not have RA she has Osteoarthritis per Dr. Kirtland Bouchard and needs to see Ortho. Pt verbalized understanding and stated she already sees Ortho Dr. Cleophas Dunker and the procedure did not work and has told them that. Told pt we can refer her to another Ortho if she would like. Pt verbalized understanding and stated will let us know.

## 2012-12-08 ENCOUNTER — Ambulatory Visit: Payer: Medicare Other | Admitting: Internal Medicine

## 2012-12-11 DIAGNOSIS — F39 Unspecified mood [affective] disorder: Secondary | ICD-10-CM | POA: Diagnosis not present

## 2012-12-16 ENCOUNTER — Other Ambulatory Visit: Payer: Self-pay | Admitting: Internal Medicine

## 2012-12-20 DIAGNOSIS — F39 Unspecified mood [affective] disorder: Secondary | ICD-10-CM | POA: Diagnosis not present

## 2012-12-21 ENCOUNTER — Encounter: Payer: Self-pay | Admitting: Internal Medicine

## 2012-12-21 ENCOUNTER — Ambulatory Visit (INDEPENDENT_AMBULATORY_CARE_PROVIDER_SITE_OTHER): Payer: Medicare Other | Admitting: Internal Medicine

## 2012-12-21 VITALS — BP 150/80 | HR 97 | Temp 97.7°F | Resp 20 | Wt 200.0 lb

## 2012-12-21 DIAGNOSIS — E119 Type 2 diabetes mellitus without complications: Secondary | ICD-10-CM | POA: Diagnosis not present

## 2012-12-21 DIAGNOSIS — M25561 Pain in right knee: Secondary | ICD-10-CM

## 2012-12-21 DIAGNOSIS — Z23 Encounter for immunization: Secondary | ICD-10-CM

## 2012-12-21 DIAGNOSIS — M25569 Pain in unspecified knee: Secondary | ICD-10-CM

## 2012-12-21 DIAGNOSIS — I10 Essential (primary) hypertension: Secondary | ICD-10-CM | POA: Diagnosis not present

## 2012-12-21 NOTE — Patient Instructions (Signed)
Limit your sodium (Salt) intake   Please check your hemoglobin A1c every 3 months  You need to lose weight.  Consider a lower calorie diet and regular exercise. 

## 2012-12-21 NOTE — Progress Notes (Signed)
Subjective:    Patient ID: Leslie Benton, female    DOB: December 19, 1940, 72 y.o.   MRN: [id number]  HPI 72 year old patient who is seen today for followup. She is scheduled to see orthopedics tomorrow for evaluation of left knee pain. She has hypertension and diabetes. Her last hemoglobin A1c elevated at 7.5. She remains on statin therapy which he tolerates well. No complaints today except for the left knee discomfort  Past Medical History  Diagnosis Date  . COLONIC POLYPS, HX OF 02/07/2008  . DIABETES MELLITUS, TYPE II 10/27/2009  . GERD 10/14/2006  . HYPERLIPIDEMIA 10/14/2006  . HYPERTENSION 10/14/2006  . HYPOTHYROIDISM 10/14/2006  . IMPAIRED GLUCOSE TOLERANCE 01/06/2009  . KNEE PAIN, RIGHT 07/15/2008  . LOW BACK PAIN 08/04/2007  . Obesity   . DEGENERATIVE JOINT DISEASE 10/14/2006    03/31/11: Pt states arthritis in her hip.    History   Social History  . Marital Status: Married    Spouse Name: N/A    Number of Children: N/A  . Years of Education: N/A   Occupational History  . Not on file.   Social History Main Topics  . Smoking status: Never Smoker   . Smokeless tobacco: Never Used  . Alcohol Use: No  . Drug Use: No  . Sexual Activity: Not on file   Other Topics Concern  . Not on file   Social History Narrative  . No narrative on file    Past Surgical History  Procedure Laterality Date  . Hemorrhoid surgery    . Tonsillectomy    . Left knee arthroscopic  April 2014    Dr. Cleophas Dunker    Family History  Problem Relation Age of Onset  . Heart disease Father     Allergies  Allergen Reactions  . Niacin And Related Hives and Swelling    No anaphalaxis, but strong reactions.  . Lactose Intolerance (Gi) Diarrhea  . Amoxicillin     REACTION: unspecified  . Penicillins     Current Outpatient Prescriptions on File Prior to Visit  Medication Sig Dispense Refill  . busPIRone (BUSPAR) 15 MG tablet Take 1 tablet (15 mg total) by mouth 2 (two) times daily as needed.  60  tablet  6  . celecoxib (CELEBREX) 200 MG capsule Take 1 capsule (200 mg total) by mouth daily.  30 capsule  0  . Cholecalciferol (VITAMIN D-3 PO) Take by mouth.      . diclofenac (VOLTAREN) 50 MG EC tablet Take 50 mg by mouth 3 (three) times daily as needed.      Marland Kitchen glimepiride (AMARYL) 4 MG tablet TAKE 1/2 A TABLET BY MOUTH DAILY BEFORE BREAKFAST  90 tablet  0  . glucose blood (ACCU-CHEK AVIVA) test strip Use daily as instructed  100 each  6  . Lancets (ACCU-CHEK MULTICLIX) lancets use once daily and if needed  102 each  3  . levothyroxine (SYNTHROID, LEVOTHROID) 125 MCG tablet take 1 tablet by mouth once daily  90 tablet  3  . losartan-hydrochlorothiazide (HYZAAR) 100-25 MG per tablet take 1 tablet by mouth once daily  90 tablet  1  . metFORMIN (GLUCOPHAGE) 1000 MG tablet take 1 tablet by mouth twice a day WITH MEAL  180 tablet  1  . omeprazole (PRILOSEC) 20 MG capsule take 1 capsule by mouth once daily  90 capsule  3  . phentermine (ADIPEX-P) 37.5 MG tablet take 1 tablet by mouth every morning  30 tablet  3  . propranolol (INDERAL) 20  MG tablet Take 1 tablet (20 mg total) by mouth 2 (two) times daily.  180 tablet  3  . simvastatin (ZOCOR) 40 MG tablet TAKE 1 TABLET BY MOUTH ONCE EVERY EVENING FOR CHOLESTEROL  90 tablet  1  . traMADol (ULTRAM) 50 MG tablet take 1 tablet by mouth every 4 to 6 hours  30 tablet  2   No current facility-administered medications on file prior to visit.    BP 150/80  Pulse 97  Temp(Src) 97.7 F (36.5 C) (Oral)  Resp 20  Wt 200 lb (90.719 kg)  BMI 34.31 kg/m2  SpO2 98%         Review of Systems  Constitutional: Negative.   HENT: Negative for hearing loss, congestion, sore throat, rhinorrhea, dental problem, sinus pressure and tinnitus.   Eyes: Negative for pain, discharge and visual disturbance.  Respiratory: Negative for cough and shortness of breath.   Cardiovascular: Negative for chest pain, palpitations and leg swelling.  Gastrointestinal:  Negative for nausea, vomiting, abdominal pain, diarrhea, constipation, blood in stool and abdominal distention.  Genitourinary: Negative for dysuria, urgency, frequency, hematuria, flank pain, vaginal bleeding, vaginal discharge, difficulty urinating, vaginal pain and pelvic pain.  Musculoskeletal: Positive for arthralgias. Negative for joint swelling and gait problem.  Skin: Negative for rash.  Neurological: Negative for dizziness, syncope, speech difficulty, weakness, numbness and headaches.  Hematological: Negative for adenopathy.  Psychiatric/Behavioral: Negative for behavioral problems, dysphoric mood and agitation. The patient is not nervous/anxious.        Objective:   Physical Exam  Constitutional: She is oriented to person, place, and time. She appears well-developed and well-nourished.  Blood pressure 140/80  HENT:  Head: Normocephalic.  Right Ear: External ear normal.  Left Ear: External ear normal.  Mouth/Throat: Oropharynx is clear and moist.  Eyes: Conjunctivae and EOM are normal. Pupils are equal, round, and reactive to light.  Neck: Normal range of motion. Neck supple. No thyromegaly present.  Cardiovascular: Normal rate, regular rhythm, normal heart sounds and intact distal pulses.   Pulmonary/Chest: Effort normal and breath sounds normal.  Abdominal: Soft. Bowel sounds are normal. She exhibits no mass. There is no tenderness.  Musculoskeletal: Normal range of motion.  Lymphadenopathy:    She has no cervical adenopathy.  Neurological: She is alert and oriented to person, place, and time.  Skin: Skin is warm and dry. No rash noted.  Psychiatric: She has a normal mood and affect. Her behavior is normal.          Assessment & Plan:  Diabetes mellitus. Will check a hemoglobin A1c. Weight loss encouraged Hypertension stable. Left knee pain. Orthopedic followup  CPX 3 months Flu vaccine today

## 2012-12-22 DIAGNOSIS — M1710 Unilateral primary osteoarthritis, unspecified knee: Secondary | ICD-10-CM | POA: Diagnosis not present

## 2012-12-25 DIAGNOSIS — M25569 Pain in unspecified knee: Secondary | ICD-10-CM | POA: Diagnosis not present

## 2012-12-26 ENCOUNTER — Telehealth: Payer: Self-pay | Admitting: Internal Medicine

## 2012-12-26 NOTE — Telephone Encounter (Signed)
Left detailed message that Hemoglobin A1c was 7.2 done on 9/4.

## 2012-12-26 NOTE — Telephone Encounter (Signed)
Pt requesting call back from Dr. Charm Rings nurse with recent lab results.  Pt states she wants the exact numbers not just if labs are ok or not ok, she also states it is ok to leave this information on her answering machine if she does not answer.

## 2013-01-04 DIAGNOSIS — F39 Unspecified mood [affective] disorder: Secondary | ICD-10-CM | POA: Diagnosis not present

## 2013-01-10 DIAGNOSIS — F39 Unspecified mood [affective] disorder: Secondary | ICD-10-CM | POA: Diagnosis not present

## 2013-01-16 DIAGNOSIS — M25569 Pain in unspecified knee: Secondary | ICD-10-CM | POA: Diagnosis not present

## 2013-01-17 DIAGNOSIS — F39 Unspecified mood [affective] disorder: Secondary | ICD-10-CM | POA: Diagnosis not present

## 2013-01-29 DIAGNOSIS — F39 Unspecified mood [affective] disorder: Secondary | ICD-10-CM | POA: Diagnosis not present

## 2013-02-05 DIAGNOSIS — M1710 Unilateral primary osteoarthritis, unspecified knee: Secondary | ICD-10-CM | POA: Diagnosis not present

## 2013-02-06 DIAGNOSIS — F39 Unspecified mood [affective] disorder: Secondary | ICD-10-CM | POA: Diagnosis not present

## 2013-02-07 DIAGNOSIS — Z124 Encounter for screening for malignant neoplasm of cervix: Secondary | ICD-10-CM | POA: Diagnosis not present

## 2013-02-07 DIAGNOSIS — Z1231 Encounter for screening mammogram for malignant neoplasm of breast: Secondary | ICD-10-CM | POA: Diagnosis not present

## 2013-02-08 DIAGNOSIS — Z124 Encounter for screening for malignant neoplasm of cervix: Secondary | ICD-10-CM | POA: Diagnosis not present

## 2013-02-09 ENCOUNTER — Other Ambulatory Visit: Payer: Self-pay | Admitting: Internal Medicine

## 2013-02-16 DIAGNOSIS — F39 Unspecified mood [affective] disorder: Secondary | ICD-10-CM | POA: Diagnosis not present

## 2013-02-26 DIAGNOSIS — F39 Unspecified mood [affective] disorder: Secondary | ICD-10-CM | POA: Diagnosis not present

## 2013-03-01 DIAGNOSIS — M1710 Unilateral primary osteoarthritis, unspecified knee: Secondary | ICD-10-CM | POA: Diagnosis not present

## 2013-03-07 DIAGNOSIS — F39 Unspecified mood [affective] disorder: Secondary | ICD-10-CM | POA: Diagnosis not present

## 2013-03-12 ENCOUNTER — Ambulatory Visit (INDEPENDENT_AMBULATORY_CARE_PROVIDER_SITE_OTHER): Payer: Medicare Other | Admitting: Ophthalmology

## 2013-03-12 DIAGNOSIS — I10 Essential (primary) hypertension: Secondary | ICD-10-CM

## 2013-03-12 DIAGNOSIS — E1139 Type 2 diabetes mellitus with other diabetic ophthalmic complication: Secondary | ICD-10-CM

## 2013-03-12 DIAGNOSIS — H35039 Hypertensive retinopathy, unspecified eye: Secondary | ICD-10-CM

## 2013-03-12 DIAGNOSIS — E11319 Type 2 diabetes mellitus with unspecified diabetic retinopathy without macular edema: Secondary | ICD-10-CM | POA: Diagnosis not present

## 2013-03-12 DIAGNOSIS — H43819 Vitreous degeneration, unspecified eye: Secondary | ICD-10-CM

## 2013-03-12 DIAGNOSIS — H2510 Age-related nuclear cataract, unspecified eye: Secondary | ICD-10-CM

## 2013-03-14 ENCOUNTER — Other Ambulatory Visit: Payer: Self-pay | Admitting: Internal Medicine

## 2013-03-20 ENCOUNTER — Ambulatory Visit: Payer: Medicare Other | Admitting: Internal Medicine

## 2013-03-20 DIAGNOSIS — F39 Unspecified mood [affective] disorder: Secondary | ICD-10-CM | POA: Diagnosis not present

## 2013-03-21 ENCOUNTER — Encounter: Payer: Self-pay | Admitting: *Deleted

## 2013-03-22 ENCOUNTER — Encounter: Payer: Self-pay | Admitting: Internal Medicine

## 2013-03-22 ENCOUNTER — Ambulatory Visit (INDEPENDENT_AMBULATORY_CARE_PROVIDER_SITE_OTHER): Payer: Medicare Other | Admitting: Internal Medicine

## 2013-03-22 VITALS — BP 170/90 | HR 92 | Temp 98.0°F | Resp 20

## 2013-03-22 DIAGNOSIS — E039 Hypothyroidism, unspecified: Secondary | ICD-10-CM

## 2013-03-22 DIAGNOSIS — M25562 Pain in left knee: Secondary | ICD-10-CM

## 2013-03-22 DIAGNOSIS — E119 Type 2 diabetes mellitus without complications: Secondary | ICD-10-CM

## 2013-03-22 DIAGNOSIS — M25569 Pain in unspecified knee: Secondary | ICD-10-CM | POA: Diagnosis not present

## 2013-03-22 DIAGNOSIS — M1990 Unspecified osteoarthritis, unspecified site: Secondary | ICD-10-CM | POA: Diagnosis not present

## 2013-03-22 DIAGNOSIS — I10 Essential (primary) hypertension: Secondary | ICD-10-CM | POA: Diagnosis not present

## 2013-03-22 DIAGNOSIS — Z23 Encounter for immunization: Secondary | ICD-10-CM | POA: Diagnosis not present

## 2013-03-22 MED ORDER — HYDROCODONE-ACETAMINOPHEN 5-325 MG PO TABS: 1.0000 | ORAL_TABLET | Freq: Four times a day (QID) | ORAL | Status: DC | PRN

## 2013-03-22 MED ORDER — PREDNISONE 10 MG PO TABS: 10.0000 mg | ORAL_TABLET | Freq: Every day | ORAL | Status: DC

## 2013-03-22 NOTE — Progress Notes (Signed)
Subjective:    Patient ID: Leslie Benton, female    DOB: Jan 18, 1941, 72 y.o.   MRN: [id number]  HPI Pre-visit discussion using our clinic review tool. No additional management support is needed unless otherwise documented below in the visit note.  72 year old patient who is seen today for her quarterly followup. Her chief complaint is left knee pain. This has been fairly chronic and has persisted following left knee arthroscopic surgery on April 17. She has been seen by orthopedics and has completed physical therapy. She is requesting prednisone and an antibiotic. Apparently intra-articular steroids have been given at least once without benefit. She has diabetes mellitus. Her last hemoglobin A1c 7.2. She has treated hypertension and a history of anxiety depression.  Past Medical History  Diagnosis Date  . COLONIC POLYPS, HX OF 02/07/2008  . DIABETES MELLITUS, TYPE II 10/27/2009  . GERD 10/14/2006  . HYPERLIPIDEMIA 10/14/2006  . HYPERTENSION 10/14/2006  . HYPOTHYROIDISM 10/14/2006  . IMPAIRED GLUCOSE TOLERANCE 01/06/2009  . KNEE PAIN, RIGHT 07/15/2008  . LOW BACK PAIN 08/04/2007  . Obesity   . DEGENERATIVE JOINT DISEASE 10/14/2006    03/31/11: Pt states arthritis in her hip.    History   Social History  . Marital Status: Married    Spouse Name: N/A    Number of Children: N/A  . Years of Education: N/A   Occupational History  . Not on file.   Social History Main Topics  . Smoking status: Never Smoker   . Smokeless tobacco: Never Used  . Alcohol Use: No  . Drug Use: No  . Sexual Activity: Not on file   Other Topics Concern  . Not on file   Social History Narrative  . No narrative on file    Past Surgical History  Procedure Laterality Date  . Hemorrhoid surgery    . Tonsillectomy    . Left knee arthroscopic  April 2014    Dr. Cleophas Dunker    Family History  Problem Relation Age of Onset  . Heart disease Father     Allergies  Allergen Reactions  . Niacin And  Related Hives and Swelling    No anaphalaxis, but strong reactions.  . Lactose Intolerance (Gi) Diarrhea  . Amoxicillin     REACTION: unspecified  . Penicillins     Current Outpatient Prescriptions on File Prior to Visit  Medication Sig Dispense Refill  . busPIRone (BUSPAR) 15 MG tablet Take 1 tablet (15 mg total) by mouth 2 (two) times daily as needed.  60 tablet  6  . Cholecalciferol (VITAMIN D-3 PO) Take by mouth.      . diclofenac (VOLTAREN) 50 MG EC tablet Take 50 mg by mouth 3 (three) times daily as needed.      Marland Kitchen glimepiride (AMARYL) 4 MG tablet TAKE 1/2 A TABLET BY MOUTH DAILY BEFORE BREAKFAST  90 tablet  0  . glucose blood (ACCU-CHEK AVIVA) test strip Use daily as instructed  100 each  6  . Lancets (ACCU-CHEK MULTICLIX) lancets use once daily and if needed  102 each  3  . levothyroxine (SYNTHROID, LEVOTHROID) 125 MCG tablet take 1 tablet by mouth once daily  90 tablet  3  . losartan-hydrochlorothiazide (HYZAAR) 100-25 MG per tablet take 1 tablet by mouth once daily  90 tablet  1  . metFORMIN (GLUCOPHAGE) 1000 MG tablet take 1 tablet by mouth twice a day WITH MEAL  180 tablet  1  . omeprazole (PRILOSEC) 20 MG capsule take 1 capsule by  mouth once daily  90 capsule  3  . phentermine (ADIPEX-P) 37.5 MG tablet take 1 tablet by mouth every morning  30 tablet  3  . propranolol (INDERAL) 20 MG tablet Take 1 tablet (20 mg total) by mouth 2 (two) times daily.  180 tablet  3  . simvastatin (ZOCOR) 40 MG tablet TAKE 1 TABLET BY MOUTH EVERY EVENING FOR CHOLESTEROL  90 tablet  1  . VOLTAREN 1 % GEL Apply 2 g topically as needed.       . celecoxib (CELEBREX) 200 MG capsule Take 1 capsule (200 mg total) by mouth daily.  30 capsule  0   No current facility-administered medications on file prior to visit.    BP 170/90  Pulse 92  Temp(Src) 98 F (36.7 C) (Oral)  Resp 20  SpO2 96%       Review of Systems  Constitutional: Negative.   HENT: Negative for congestion, dental problem,  hearing loss, rhinorrhea, sinus pressure, sore throat and tinnitus.   Eyes: Negative for pain, discharge and visual disturbance.  Respiratory: Negative for cough and shortness of breath.   Cardiovascular: Negative for chest pain, palpitations and leg swelling.  Gastrointestinal: Negative for nausea, vomiting, abdominal pain, diarrhea, constipation, blood in stool and abdominal distention.  Genitourinary: Negative for dysuria, urgency, frequency, hematuria, flank pain, vaginal bleeding, vaginal discharge, difficulty urinating, vaginal pain and pelvic pain.  Musculoskeletal: Positive for arthralgias (Left knee pain) and gait problem. Negative for joint swelling.  Skin: Negative for rash.  Neurological: Negative for dizziness, syncope, speech difficulty, weakness, numbness and headaches.  Hematological: Negative for adenopathy.  Psychiatric/Behavioral: Negative for behavioral problems, dysphoric mood and agitation. The patient is not nervous/anxious.        Objective:   Physical Exam  Constitutional: She is oriented to person, place, and time. She appears well-developed and well-nourished.  HENT:  Head: Normocephalic.  Right Ear: External ear normal.  Left Ear: External ear normal.  Mouth/Throat: Oropharynx is clear and moist.  Eyes: Conjunctivae and EOM are normal. Pupils are equal, round, and reactive to light.  Neck: Normal range of motion. Neck supple. No thyromegaly present.  Cardiovascular: Normal rate, regular rhythm, normal heart sounds and intact distal pulses.   Pulmonary/Chest: Effort normal and breath sounds normal.  Abdominal: Soft. Bowel sounds are normal. She exhibits no mass. There is no tenderness.  Musculoskeletal: Normal range of motion.  Left knee  without inflammatory changes  Lymphadenopathy:    She has no cervical adenopathy.  Neurological: She is alert and oriented to person, place, and time.  Skin: Skin is warm and dry. No rash noted.  Psychiatric: She has a  normal mood and affect. Her behavior is normal.          Assessment & Plan:    left knee pain. We'll check a sedimentation rate to reassure patient there is no signs of active infection  Diabetes mellitus. We'll check a hemoglobin A1c  Hypertension   Recheck 3 months  Orthopedic followup

## 2013-03-22 NOTE — Progress Notes (Signed)
Pre-visit discussion using our clinic review tool. No additional management support is needed unless otherwise documented below in the visit note.  

## 2013-03-22 NOTE — Patient Instructions (Signed)
Please check your hemoglobin A1c every 3 months  Limit your sodium (Salt) intake  Orthopedic followup

## 2013-03-23 ENCOUNTER — Other Ambulatory Visit: Payer: Self-pay | Admitting: Internal Medicine

## 2013-03-26 DIAGNOSIS — M25569 Pain in unspecified knee: Secondary | ICD-10-CM | POA: Diagnosis not present

## 2013-03-29 ENCOUNTER — Other Ambulatory Visit: Payer: Self-pay | Admitting: Internal Medicine

## 2013-04-03 DIAGNOSIS — F39 Unspecified mood [affective] disorder: Secondary | ICD-10-CM | POA: Diagnosis not present

## 2013-04-05 ENCOUNTER — Encounter: Payer: Medicare Other | Admitting: Internal Medicine

## 2013-04-05 DIAGNOSIS — H2510 Age-related nuclear cataract, unspecified eye: Secondary | ICD-10-CM | POA: Diagnosis not present

## 2013-04-05 DIAGNOSIS — R51 Headache: Secondary | ICD-10-CM | POA: Diagnosis not present

## 2013-04-05 DIAGNOSIS — M069 Rheumatoid arthritis, unspecified: Secondary | ICD-10-CM | POA: Diagnosis not present

## 2013-04-05 DIAGNOSIS — H40019 Open angle with borderline findings, low risk, unspecified eye: Secondary | ICD-10-CM | POA: Diagnosis not present

## 2013-04-05 DIAGNOSIS — E119 Type 2 diabetes mellitus without complications: Secondary | ICD-10-CM | POA: Diagnosis not present

## 2013-04-05 LAB — HM DIABETES EYE EXAM

## 2013-04-06 ENCOUNTER — Encounter: Payer: Medicare Other | Admitting: Internal Medicine

## 2013-04-24 DIAGNOSIS — F39 Unspecified mood [affective] disorder: Secondary | ICD-10-CM | POA: Diagnosis not present

## 2013-04-25 ENCOUNTER — Encounter: Payer: Self-pay | Admitting: Internal Medicine

## 2013-04-25 ENCOUNTER — Ambulatory Visit (INDEPENDENT_AMBULATORY_CARE_PROVIDER_SITE_OTHER): Payer: Medicare Other | Admitting: Internal Medicine

## 2013-04-25 VITALS — BP 140/90 | HR 88 | Temp 97.8°F | Resp 20 | Wt 207.0 lb

## 2013-04-25 DIAGNOSIS — E785 Hyperlipidemia, unspecified: Secondary | ICD-10-CM

## 2013-04-25 DIAGNOSIS — Z860100 Personal history of colon polyps, unspecified: Secondary | ICD-10-CM

## 2013-04-25 DIAGNOSIS — E119 Type 2 diabetes mellitus without complications: Secondary | ICD-10-CM | POA: Diagnosis not present

## 2013-04-25 DIAGNOSIS — I10 Essential (primary) hypertension: Secondary | ICD-10-CM

## 2013-04-25 DIAGNOSIS — M1990 Unspecified osteoarthritis, unspecified site: Secondary | ICD-10-CM | POA: Diagnosis not present

## 2013-04-25 DIAGNOSIS — Z0000 Encounter for general adult medical examination without abnormal findings: Secondary | ICD-10-CM

## 2013-04-25 DIAGNOSIS — E039 Hypothyroidism, unspecified: Secondary | ICD-10-CM

## 2013-04-25 LAB — LIPID PANEL
CHOL/HDL RATIO: 4
Cholesterol: 242 mg/dL — ABNORMAL HIGH (ref 0–200)
HDL: 57.8 mg/dL (ref >39.00)
Triglycerides: 174 mg/dL — ABNORMAL HIGH (ref 0.0–149.0)
VLDL: 34.8 mg/dL (ref 0.0–40.0)

## 2013-04-25 LAB — MICROALBUMIN / CREATININE URINE RATIO
Creatinine,U: 138.8 mg/dL
MICROALB UR: 3.5 mg/dL — AB (ref 0.0–1.9)
MICROALB/CREAT RATIO: 2.5 mg/g (ref 0.0–30.0)

## 2013-04-25 LAB — LDL CHOLESTEROL, DIRECT: Direct LDL: 155.3 mg/dL

## 2013-04-25 MED ORDER — PHENTERMINE HCL 37.5 MG PO CAPS: 37.5000 mg | ORAL_CAPSULE | ORAL | Status: DC

## 2013-04-25 NOTE — Progress Notes (Signed)
Pre-visit discussion using our clinic review tool. No additional management support is needed unless otherwise documented below in the visit note.  

## 2013-04-25 NOTE — Patient Instructions (Signed)
Please check your hemoglobin A1c every 3 months  Limit your sodium (Salt) intake    It is important that you exercise regularly, at least 20 minutes 3 to 4 times per week.  If you develop chest pain or shortness of breath seek  medical attention.  You need to lose weight.  Consider a lower calorie diet and regular exercise. 

## 2013-04-25 NOTE — Progress Notes (Signed)
Subjective:    Patient ID: Leslie Benton, female    DOB: [birth year], 73 y.o.   MRN: [id number]  HPI  Subjective:    Patient ID: Leslie Benton, female    DOB: 02-Jan-1941, 73 y.o.   MRN: [id number]  HPI  64 -year-old patient  who is seen today for an annual exam. She has type 2 diabetes hypertension and a history colonic polyps. She has exogenous obesity and continues her efforts at weight loss. She is doing quite well today he does have osteoarthritis and knee and low back pain. Her last colonoscopy 2011. Last hemoglobin A1c 7.5  1. Risk factors, based on past  M,S,F history- cardiovascular risk factors include diabetes hypertension and dyslipidemia  2.  Physical activities: Exercises regularly and attempt to lose weight  3.  Depression/mood: History mild depression controlled on sertraline  4.  Hearing: No deficits  5.  ADL's: Independent in all aspects of daily living  6.  Fall risk: Low  7.  Home safety: No problems identified  8.  Height weight, and visual acuity; height and weight stable no change in visual acuity  9.  Counseling: Ophthalmology referral and encouraged will need followup colonoscopy in one year  10. Lab orders based on risk factors: Laboratory profile including hemoglobin A1c will be reviewed 11. Referral : Ophthalmology referral encouraged 12. Care plan: Continue efforts at weight loss exercise regimen and better diet will check a hemoglobin A1c today  13. Cognitive assessment: Alert and oriented with normal affect. No cognitive dysfunction    Allergies:    Amoxicillin (Amoxicillin)   Past History:  Past Medical History:   Hyperlipidemia  Hypertension  Hypothyroidism  GERD  Obesity  DJD  Low back pain  Colonic polyps, hx of   Past Surgical History:   Hemorrhoidectomy  Tonsillectomy  sigmoidoscopy 1998, 2003  colonoscopy 2008   Family History:   father died age 71 complications of diabetes, coronary artery disease  mother died age  22, renal failure  Three brothers, one died of renal cancer  two sisters, one deceased from endometrial cancer   Social History:    Divorced  Never Smoked    Wt Readings from Last 3 Encounters:  04/25/13 207 lb (93.895 kg)  12/21/12 200 lb (90.719 kg)  10/06/12 203 lb (92.08 kg)     Review of Systems  Constitutional: Negative for fever, appetite change, fatigue and unexpected weight change.  HENT: Negative for hearing loss, ear pain, nosebleeds, congestion, sore throat, mouth sores, trouble swallowing, neck stiffness, dental problem, voice change, sinus pressure and tinnitus.   Eyes: Negative for photophobia, pain, redness and visual disturbance.  Respiratory: Negative for cough, chest tightness and shortness of breath.   Cardiovascular: Negative for chest pain, palpitations and leg swelling.  Gastrointestinal: Negative for nausea, vomiting, abdominal pain, diarrhea, constipation, blood in stool, abdominal distention and rectal pain.  Genitourinary: Negative for dysuria, urgency, frequency, hematuria, flank pain, vaginal bleeding, vaginal discharge, difficulty urinating, genital sores, vaginal pain, menstrual problem and pelvic pain.  Musculoskeletal: Positive for back pain and gait problem. Negative for arthralgias.  Skin: Negative for rash.  Neurological: Negative for dizziness, syncope, speech difficulty, weakness, light-headedness, numbness and headaches.  Hematological: Negative for adenopathy. Does not bruise/bleed easily.  Psychiatric/Behavioral: Negative for suicidal ideas, behavioral problems, self-injury, dysphoric mood and agitation. The patient is not nervous/anxious.        Objective:   Physical Exam  Constitutional: She is oriented to person, place, and time. She appears  well-developed and well-nourished.       Obese. Blood pressure 120/70  HENT:  Head: Normocephalic and atraumatic.  Right Ear: External ear normal.  Left Ear: External ear normal.  Mouth/Throat:  Oropharynx is clear and moist.  Eyes: Conjunctivae and EOM are normal.  Neck: Normal range of motion. Neck supple. No JVD present. No thyromegaly present.  Cardiovascular: Normal rate, regular rhythm, normal heart sounds and intact distal pulses.   No murmur heard.      Posterior tibia pulses full  Pulmonary/Chest: Effort normal and breath sounds normal. She has no wheezes. She has no rales.  Abdominal: Soft. Bowel sounds are normal. She exhibits no distension and no mass. There is no tenderness. There is no rebound and no guarding.  Musculoskeletal: Normal range of motion. She exhibits no edema and no tenderness.  Neurological: She is alert and oriented to person, place, and time. She has normal reflexes. No cranial nerve deficit. She exhibits normal muscle tone. Coordination normal.       Foot exam unremarkable intact to vibratory sensation and monofilament testing  Skin: Skin is warm and dry. No rash noted.  Psychiatric: She has a normal mood and affect. Her behavior is normal.          Assessment & Plan:   Preventive health examination Diabetes. White cell issues discussed and stressed. We'll check a hemoglobin A1c Hypertension well controlled Dyslipidemia. We'll check a lipid profile   Review of Systems Persistent left knee pain    Objective:   Physical Exam  As above Decreased left dorsalis pedis pulse      Assessment & Plan:   Preventive health examination Diabetes mellitus. Weight loss exercise encouraged Dyslipidemia. Continue statin therapy Hypertension stable Osteoarthritis   Recheck 3 months

## 2013-04-26 ENCOUNTER — Telehealth: Payer: Self-pay

## 2013-04-26 NOTE — Telephone Encounter (Signed)
Relevant patient education mailed to patient.  

## 2013-05-04 DIAGNOSIS — L219 Seborrheic dermatitis, unspecified: Secondary | ICD-10-CM | POA: Diagnosis not present

## 2013-05-04 DIAGNOSIS — D2330 Other benign neoplasm of skin of unspecified part of face: Secondary | ICD-10-CM | POA: Diagnosis not present

## 2013-05-04 DIAGNOSIS — D235 Other benign neoplasm of skin of trunk: Secondary | ICD-10-CM | POA: Diagnosis not present

## 2013-05-09 DIAGNOSIS — E119 Type 2 diabetes mellitus without complications: Secondary | ICD-10-CM | POA: Diagnosis not present

## 2013-05-09 DIAGNOSIS — E7800 Pure hypercholesterolemia, unspecified: Secondary | ICD-10-CM | POA: Diagnosis not present

## 2013-05-09 DIAGNOSIS — M25469 Effusion, unspecified knee: Secondary | ICD-10-CM | POA: Diagnosis not present

## 2013-05-09 DIAGNOSIS — M1710 Unilateral primary osteoarthritis, unspecified knee: Secondary | ICD-10-CM | POA: Diagnosis not present

## 2013-05-09 DIAGNOSIS — I10 Essential (primary) hypertension: Secondary | ICD-10-CM | POA: Diagnosis not present

## 2013-05-09 DIAGNOSIS — IMO0002 Reserved for concepts with insufficient information to code with codable children: Secondary | ICD-10-CM | POA: Diagnosis not present

## 2013-05-09 DIAGNOSIS — E039 Hypothyroidism, unspecified: Secondary | ICD-10-CM | POA: Diagnosis not present

## 2013-05-09 DIAGNOSIS — M25569 Pain in unspecified knee: Secondary | ICD-10-CM | POA: Diagnosis not present

## 2013-05-09 DIAGNOSIS — Z9889 Other specified postprocedural states: Secondary | ICD-10-CM | POA: Diagnosis not present

## 2013-05-09 DIAGNOSIS — Z6836 Body mass index (BMI) 36.0-36.9, adult: Secondary | ICD-10-CM | POA: Diagnosis not present

## 2013-05-16 DIAGNOSIS — F39 Unspecified mood [affective] disorder: Secondary | ICD-10-CM | POA: Diagnosis not present

## 2013-05-21 ENCOUNTER — Other Ambulatory Visit: Payer: Self-pay | Admitting: Internal Medicine

## 2013-05-21 ENCOUNTER — Telehealth: Payer: Self-pay | Admitting: Internal Medicine

## 2013-05-21 NOTE — Telephone Encounter (Signed)
Relevant patient education mailed to patient.  

## 2013-05-31 DIAGNOSIS — F39 Unspecified mood [affective] disorder: Secondary | ICD-10-CM | POA: Diagnosis not present

## 2013-06-01 DIAGNOSIS — M1710 Unilateral primary osteoarthritis, unspecified knee: Secondary | ICD-10-CM | POA: Diagnosis not present

## 2013-06-01 DIAGNOSIS — IMO0002 Reserved for concepts with insufficient information to code with codable children: Secondary | ICD-10-CM | POA: Diagnosis not present

## 2013-06-19 DIAGNOSIS — F39 Unspecified mood [affective] disorder: Secondary | ICD-10-CM | POA: Diagnosis not present

## 2013-07-03 DIAGNOSIS — L259 Unspecified contact dermatitis, unspecified cause: Secondary | ICD-10-CM | POA: Diagnosis not present

## 2013-07-03 DIAGNOSIS — L820 Inflamed seborrheic keratosis: Secondary | ICD-10-CM | POA: Diagnosis not present

## 2013-07-11 ENCOUNTER — Telehealth: Payer: Self-pay | Admitting: Internal Medicine

## 2013-07-11 NOTE — Telephone Encounter (Signed)
Patient is to followup with Dr. Durward Fortes, or another orthopedic doctor of her choice

## 2013-07-11 NOTE — Telephone Encounter (Signed)
Pt is requesting a mri of her leg to show arthritis in leg. Pt had a procedure by dr whitfield. Arthroscopic

## 2013-07-11 NOTE — Telephone Encounter (Signed)
Please advise 

## 2013-07-13 NOTE — Telephone Encounter (Signed)
Left message on voicemail to call office.  

## 2013-07-16 NOTE — Telephone Encounter (Signed)
Spoke to pt told her can not order MRI, needs to follow up with Dr. Durward Fortes or another orthopedic doctor of her choice. Pt verbalized understanding. Told pt Hydrocodone will make her sleepy, she can try Tramadol if Dr. Raliegh Ip says okay. Pt said she can not take that. Told her then only thing is Aleve or X-strength Tylenol. Pt verbalized understanding and stated she will try Aleve. Told her okay.

## 2013-07-16 NOTE — Telephone Encounter (Signed)
Pt returned. Pt does NOT want to see dr whitfield.  Pt also wants to know the strongest pain med she could take that will not make her sleepy. Ok to leave message.

## 2013-07-24 ENCOUNTER — Encounter: Payer: Self-pay | Admitting: Internal Medicine

## 2013-07-24 ENCOUNTER — Ambulatory Visit (INDEPENDENT_AMBULATORY_CARE_PROVIDER_SITE_OTHER): Payer: Medicare Other | Admitting: Internal Medicine

## 2013-07-24 VITALS — BP 164/90 | HR 90 | Temp 98.2°F | Resp 20 | Ht 64.0 in | Wt 212.0 lb

## 2013-07-24 DIAGNOSIS — E119 Type 2 diabetes mellitus without complications: Secondary | ICD-10-CM

## 2013-07-24 DIAGNOSIS — E039 Hypothyroidism, unspecified: Secondary | ICD-10-CM

## 2013-07-24 DIAGNOSIS — I10 Essential (primary) hypertension: Secondary | ICD-10-CM | POA: Diagnosis not present

## 2013-07-24 NOTE — Progress Notes (Signed)
Pre-visit discussion using our clinic review tool. No additional management support is needed unless otherwise documented below in the visit note.  

## 2013-07-24 NOTE — Progress Notes (Signed)
Subjective:    Patient ID: Leslie Benton, female    DOB: [birth year], 73 y.o.   MRN: [id number]  HPI  73 year old patient who is seen today for followup.  He continues to have left knee pain and is contemplating total knee replacement surgery.  She has felt generally poorly and challenges him off multiple medications including metformin and her blood pressure medication.  This has not resulted in any clinical improvement.  She generally remains anxious and bothered with her left knee pain.  Her last hemoglobin A1c was 7.5  Wt Readings from Last 3 Encounters:  04/25/13 207 lb (93.895 kg)  12/21/12 200 lb (90.719 kg)  10/06/12 203 lb (92.08 kg)   Past Medical History  Diagnosis Date  . COLONIC POLYPS, HX OF 02/07/2008  . DIABETES MELLITUS, TYPE II 10/27/2009  . GERD 10/14/2006  . HYPERLIPIDEMIA 10/14/2006  . HYPERTENSION 10/14/2006  . HYPOTHYROIDISM 10/14/2006  . IMPAIRED GLUCOSE TOLERANCE 01/06/2009  . KNEE PAIN, RIGHT 07/15/2008  . LOW BACK PAIN 08/04/2007  . Obesity   . DEGENERATIVE JOINT DISEASE 10/14/2006    03/31/11: Pt states arthritis in her hip.    History   Social History  . Marital Status: Married    Spouse Name: N/A    Number of Children: N/A  . Years of Education: N/A   Occupational History  . Not on file.   Social History Main Topics  . Smoking status: Never Smoker   . Smokeless tobacco: Never Used  . Alcohol Use: No  . Drug Use: No  . Sexual Activity: Not on file   Other Topics Concern  . Not on file   Social History Narrative  . No narrative on file    Past Surgical History  Procedure Laterality Date  . Hemorrhoid surgery    . Tonsillectomy    . Left knee arthroscopic  April 2014    Dr. Durward Fortes    Family History  Problem Relation Age of Onset  . Heart disease Father     Allergies  Allergen Reactions  . Niacin And Related Hives and Swelling    No anaphalaxis, but strong reactions.  . Lactose Intolerance (Gi) Diarrhea  . Amoxicillin    REACTION: unspecified  . Penicillins     Current Outpatient Prescriptions on File Prior to Visit  Medication Sig Dispense Refill  . busPIRone (BUSPAR) 15 MG tablet Take 1 tablet (15 mg total) by mouth 2 (two) times daily as needed.  60 tablet  6  . Cholecalciferol (VITAMIN D-3 PO) Take by mouth.      Marland Kitchen glimepiride (AMARYL) 4 MG tablet TAKE 1/2 A TABLET BY MOUTH DAILY BEFORE BREAKFAST  90 tablet  1  . glucose blood (ACCU-CHEK AVIVA) test strip Use daily as instructed  100 each  6  . Lancets (ACCU-CHEK MULTICLIX) lancets use once daily and if needed  102 each  3  . levothyroxine (SYNTHROID, LEVOTHROID) 125 MCG tablet take 1 tablet by mouth once daily  90 tablet  3  . phentermine 37.5 MG capsule Take 1 capsule (37.5 mg total) by mouth every morning.  90 capsule  3  . VOLTAREN 1 % GEL Apply 2 g topically as needed.       Marland Kitchen HYDROcodone-acetaminophen (NORCO/VICODIN) 5-325 MG per tablet Take 1 tablet by mouth every 6 (six) hours as needed for moderate pain.  60 tablet  0  . losartan-hydrochlorothiazide (HYZAAR) 100-25 MG per tablet take 1 tablet by mouth once daily  90 tablet  1  . metFORMIN (GLUCOPHAGE) 1000 MG tablet take 1 tablet by mouth twice a day with food  180 tablet  2  . omeprazole (PRILOSEC) 20 MG capsule take 1 capsule by mouth once daily  90 capsule  3  . propranolol (INDERAL) 20 MG tablet Take 1 tablet (20 mg total) by mouth 2 (two) times daily.  180 tablet  3  . simvastatin (ZOCOR) 40 MG tablet TAKE 1 TABLET BY MOUTH EVERY EVENING FOR CHOLESTEROL  90 tablet  1   No current facility-administered medications on file prior to visit.    BP 164/90  Pulse 90  Temp(Src) 98.2 F (36.8 C) (Oral)  Resp 20  Ht 5\' 4"  (1.626 m)  Wt 212 lb (96.163 kg)  BMI 36.37 kg/m2  SpO2 98%    Review of Systems  Constitutional: Negative.   HENT: Negative for congestion, dental problem, hearing loss, rhinorrhea, sinus pressure, sore throat and tinnitus.   Eyes: Negative for pain, discharge and  visual disturbance.  Respiratory: Negative for cough and shortness of breath.   Cardiovascular: Negative for chest pain, palpitations and leg swelling.  Gastrointestinal: Negative for nausea, vomiting, abdominal pain, diarrhea, constipation, blood in stool and abdominal distention.  Genitourinary: Negative for dysuria, urgency, frequency, hematuria, flank pain, vaginal bleeding, vaginal discharge, difficulty urinating, vaginal pain and pelvic pain.  Musculoskeletal: Positive for arthralgias and gait problem. Negative for joint swelling.  Skin: Negative for rash.  Neurological: Negative for dizziness, syncope, speech difficulty, weakness, numbness and headaches.  Hematological: Negative for adenopathy.  Psychiatric/Behavioral: Negative for behavioral problems, dysphoric mood and agitation. The patient is nervous/anxious.        Objective:   Physical Exam  Constitutional: She is oriented to person, place, and time. She appears well-developed and well-nourished.  Overweight Blood pressure 150/90  HENT:  Head: Normocephalic.  Right Ear: External ear normal.  Left Ear: External ear normal.  Mouth/Throat: Oropharynx is clear and moist.  Eyes: Conjunctivae and EOM are normal. Pupils are equal, round, and reactive to light.  Neck: Normal range of motion. Neck supple. No thyromegaly present.  Cardiovascular: Normal rate, regular rhythm, normal heart sounds and intact distal pulses.   Slight decreased pedal pulses on the left  Pulmonary/Chest: Effort normal and breath sounds normal.  Abdominal: Soft. Bowel sounds are normal. She exhibits no mass. There is no tenderness.  Musculoskeletal: Normal range of motion.  Lymphadenopathy:    She has no cervical adenopathy.  Neurological: She is alert and oriented to person, place, and time.  Skin: Skin is warm and dry. No rash noted.  Psychiatric: She has a normal mood and affect. Her behavior is normal.          Assessment & Plan:   Diabetes  mellitus.  Will resume metformin therapy.  Continue glyburide.  Recheck 3 months Hypertension.  Poor control secondary to poor compliance.  We'll resume losartan, hydrochlorothiazide DJD.  Followup orthopedics recheck 3 months

## 2013-07-24 NOTE — Patient Instructions (Signed)
Limit your sodium (Salt) intake   Please check your hemoglobin A1c every 3 months    It is important that you exercise regularly, at least 20 minutes 3 to 4 times per week.  If you develop chest pain or shortness of breath seek  medical attention.  You need to lose weight.  Consider a lower calorie diet and regular exercise. 

## 2013-09-11 ENCOUNTER — Telehealth: Payer: Self-pay | Admitting: Internal Medicine

## 2013-09-11 NOTE — Telephone Encounter (Signed)
Pt is requesting an order to get a walker, due to trouble standing and walking.

## 2013-09-28 ENCOUNTER — Encounter (HOSPITAL_COMMUNITY): Payer: Self-pay | Admitting: Emergency Medicine

## 2013-09-28 ENCOUNTER — Emergency Department (HOSPITAL_COMMUNITY): Payer: Medicare Other

## 2013-09-28 ENCOUNTER — Inpatient Hospital Stay (HOSPITAL_COMMUNITY)
Admission: EM | Admit: 2013-09-28 | Discharge: 2013-09-30 | DRG: 312 | Disposition: A | Discharge status: Home Health | Payer: Medicare Other | Attending: Family Medicine | Admitting: Family Medicine

## 2013-09-28 DIAGNOSIS — E669 Obesity, unspecified: Secondary | ICD-10-CM | POA: Diagnosis present

## 2013-09-28 DIAGNOSIS — R404 Transient alteration of awareness: Secondary | ICD-10-CM | POA: Diagnosis not present

## 2013-09-28 DIAGNOSIS — R4182 Altered mental status, unspecified: Secondary | ICD-10-CM | POA: Diagnosis not present

## 2013-09-28 DIAGNOSIS — I517 Cardiomegaly: Secondary | ICD-10-CM | POA: Diagnosis present

## 2013-09-28 DIAGNOSIS — I10 Essential (primary) hypertension: Secondary | ICD-10-CM | POA: Diagnosis present

## 2013-09-28 DIAGNOSIS — T68XXXA Hypothermia, initial encounter: Secondary | ICD-10-CM | POA: Diagnosis not present

## 2013-09-28 DIAGNOSIS — J189 Pneumonia, unspecified organism: Secondary | ICD-10-CM | POA: Diagnosis not present

## 2013-09-28 DIAGNOSIS — R110 Nausea: Secondary | ICD-10-CM | POA: Diagnosis not present

## 2013-09-28 DIAGNOSIS — I951 Orthostatic hypotension: Secondary | ICD-10-CM | POA: Diagnosis present

## 2013-09-28 DIAGNOSIS — E119 Type 2 diabetes mellitus without complications: Secondary | ICD-10-CM | POA: Diagnosis present

## 2013-09-28 DIAGNOSIS — Z6837 Body mass index (BMI) 37.0-37.9, adult: Secondary | ICD-10-CM | POA: Diagnosis not present

## 2013-09-28 DIAGNOSIS — R5381 Other malaise: Secondary | ICD-10-CM | POA: Diagnosis not present

## 2013-09-28 DIAGNOSIS — R918 Other nonspecific abnormal finding of lung field: Secondary | ICD-10-CM | POA: Diagnosis not present

## 2013-09-28 DIAGNOSIS — Z860100 Personal history of colon polyps, unspecified: Secondary | ICD-10-CM

## 2013-09-28 DIAGNOSIS — R680 Hypothermia, not associated with low environmental temperature: Secondary | ICD-10-CM | POA: Diagnosis present

## 2013-09-28 DIAGNOSIS — E039 Hypothyroidism, unspecified: Secondary | ICD-10-CM | POA: Diagnosis present

## 2013-09-28 DIAGNOSIS — E785 Hyperlipidemia, unspecified: Secondary | ICD-10-CM | POA: Diagnosis present

## 2013-09-28 DIAGNOSIS — K219 Gastro-esophageal reflux disease without esophagitis: Secondary | ICD-10-CM | POA: Diagnosis present

## 2013-09-28 DIAGNOSIS — Z79899 Other long term (current) drug therapy: Secondary | ICD-10-CM

## 2013-09-28 DIAGNOSIS — M545 Low back pain, unspecified: Secondary | ICD-10-CM

## 2013-09-28 DIAGNOSIS — M25569 Pain in unspecified knee: Secondary | ICD-10-CM

## 2013-09-28 DIAGNOSIS — J9819 Other pulmonary collapse: Secondary | ICD-10-CM | POA: Diagnosis present

## 2013-09-28 DIAGNOSIS — R42 Dizziness and giddiness: Secondary | ICD-10-CM | POA: Diagnosis present

## 2013-09-28 DIAGNOSIS — R5383 Other fatigue: Secondary | ICD-10-CM | POA: Diagnosis not present

## 2013-09-28 DIAGNOSIS — M1990 Unspecified osteoarthritis, unspecified site: Secondary | ICD-10-CM

## 2013-09-28 DIAGNOSIS — R0602 Shortness of breath: Secondary | ICD-10-CM | POA: Diagnosis not present

## 2013-09-28 DIAGNOSIS — F329 Major depressive disorder, single episode, unspecified: Secondary | ICD-10-CM

## 2013-09-28 DIAGNOSIS — F32A Depression, unspecified: Secondary | ICD-10-CM

## 2013-09-28 LAB — CBC WITH DIFFERENTIAL/PLATELET
BASOS ABS: 0 10*3/uL (ref 0.0–0.1)
Basophils Relative: 0 % (ref 0–1)
EOS PCT: 0 % (ref 0–5)
Eosinophils Absolute: 0 10*3/uL (ref 0.0–0.7)
HEMATOCRIT: 38.8 % (ref 36.0–46.0)
HEMOGLOBIN: 12.7 g/dL (ref 12.0–15.0)
Lymphocytes Relative: 25 % (ref 12–46)
Lymphs Abs: 1.7 10*3/uL (ref 0.7–4.0)
MCH: 28.9 pg (ref 26.0–34.0)
MCHC: 32.7 g/dL (ref 30.0–36.0)
MCV: 88.4 fL (ref 78.0–100.0)
MONO ABS: 0.3 10*3/uL (ref 0.1–1.0)
MONOS PCT: 4 % (ref 3–12)
NEUTROS ABS: 4.9 10*3/uL (ref 1.7–7.7)
Neutrophils Relative %: 71 % (ref 43–77)
Platelets: 337 10*3/uL (ref 150–400)
RBC: 4.39 MIL/uL (ref 3.87–5.11)
RDW: 13.7 % (ref 11.5–15.5)
WBC: 6.9 10*3/uL (ref 4.0–10.5)

## 2013-09-28 LAB — COMPREHENSIVE METABOLIC PANEL
ALT: 22 U/L (ref 0–35)
AST: 26 U/L (ref 0–37)
Albumin: 4.1 g/dL (ref 3.5–5.2)
Alkaline Phosphatase: 86 U/L (ref 39–117)
BUN: 18 mg/dL (ref 6–23)
CALCIUM: 9.3 mg/dL (ref 8.4–10.5)
CHLORIDE: 100 meq/L (ref 96–112)
CO2: 25 meq/L (ref 19–32)
CREATININE: 0.69 mg/dL (ref 0.50–1.10)
GFR calc Af Amer: >90 mL/min (ref >90)
GFR, EST NON AFRICAN AMERICAN: 84 mL/min — AB (ref >90)
Glucose, Bld: 198 mg/dL — ABNORMAL HIGH (ref 70–99)
Potassium: 4.2 mEq/L (ref 3.7–5.3)
Sodium: 139 mEq/L (ref 137–147)
Total Bilirubin: 0.2 mg/dL — ABNORMAL LOW (ref 0.3–1.2)
Total Protein: 7.4 g/dL (ref 6.0–8.3)

## 2013-09-28 LAB — LACTIC ACID, PLASMA: LACTIC ACID, VENOUS: 2 mmol/L (ref 0.5–2.2)

## 2013-09-28 LAB — URINALYSIS, ROUTINE W REFLEX MICROSCOPIC
Bilirubin Urine: NEGATIVE
Glucose, UA: 100 mg/dL — AB
Hgb urine dipstick: NEGATIVE
Ketones, ur: 15 mg/dL — AB
Leukocytes, UA: NEGATIVE
NITRITE: NEGATIVE
PROTEIN: NEGATIVE mg/dL
Specific Gravity, Urine: 1.025 (ref 1.005–1.030)
Urobilinogen, UA: 0.2 mg/dL (ref 0.0–1.0)
pH: 6.5 (ref 5.0–8.0)

## 2013-09-28 LAB — GLUCOSE, CAPILLARY: Glucose-Capillary: 149 mg/dL — ABNORMAL HIGH (ref 70–99)

## 2013-09-28 LAB — CBG MONITORING, ED
Glucose-Capillary: 169 mg/dL — ABNORMAL HIGH (ref 70–99)
Glucose-Capillary: 141 mg/dL — ABNORMAL HIGH (ref 70–99)

## 2013-09-28 LAB — PHOSPHORUS: Phosphorus: 3.4 mg/dL (ref 2.3–4.6)

## 2013-09-28 LAB — MRSA PCR SCREENING: MRSA by PCR: NEGATIVE

## 2013-09-28 LAB — MAGNESIUM: Magnesium: 2.1 mg/dL (ref 1.5–2.5)

## 2013-09-28 LAB — T4: T4, Total: 6.4 ug/dL (ref 5.0–12.5)

## 2013-09-28 LAB — TROPONIN I: Troponin I: <0.3 ng/mL (ref <0.30)

## 2013-09-28 MED ORDER — GLIMEPIRIDE 2 MG PO TABS
2.0000 mg | ORAL_TABLET | Freq: Every day | ORAL | Status: DC
  Administered 2013-09-29 – 2013-09-30 (×2): 2 mg via ORAL
  Filled 2013-09-28 (×4): qty 1

## 2013-09-28 MED ORDER — INSULIN ASPART 100 UNIT/ML ~~LOC~~ SOLN
0.0000 [IU] | Freq: Three times a day (TID) | SUBCUTANEOUS | Status: DC
  Administered 2013-09-29 (×3): 2 [IU] via SUBCUTANEOUS
  Administered 2013-09-30 (×2): 3 [IU] via SUBCUTANEOUS

## 2013-09-28 MED ORDER — HEPARIN SODIUM (PORCINE) 5000 UNIT/ML IJ SOLN
5000.0000 [IU] | Freq: Three times a day (TID) | INTRAMUSCULAR | Status: DC
Start: 2013-09-28 — End: 2013-09-30
  Administered 2013-09-28 – 2013-09-30 (×5): 5000 [IU] via SUBCUTANEOUS
  Filled 2013-09-28 (×8): qty 1

## 2013-09-28 MED ORDER — ONDANSETRON HCL 4 MG/2ML IJ SOLN
4.0000 mg | Freq: Four times a day (QID) | INTRAMUSCULAR | Status: DC | PRN
  Administered 2013-09-29: 4 mg via INTRAVENOUS
  Filled 2013-09-28: qty 2

## 2013-09-28 MED ORDER — LEVOFLOXACIN IN D5W 750 MG/150ML IV SOLN
750.0000 mg | Freq: Once | INTRAVENOUS | Status: DC
  Administered 2013-09-28: 750 mg via INTRAVENOUS
  Filled 2013-09-28: qty 150

## 2013-09-28 MED ORDER — ONDANSETRON HCL 4 MG PO TABS: 4.0000 mg | ORAL_TABLET | Freq: Four times a day (QID) | ORAL | Status: DC | PRN

## 2013-09-28 MED ORDER — PANTOPRAZOLE SODIUM 40 MG PO TBEC
40.0000 mg | DELAYED_RELEASE_TABLET | Freq: Every day | ORAL | Status: DC
Start: 2013-09-28 — End: 2013-09-30
  Administered 2013-09-28 – 2013-09-30 (×3): 40 mg via ORAL
  Filled 2013-09-28 (×3): qty 1

## 2013-09-28 MED ORDER — SODIUM CHLORIDE 0.9 % IV BOLUS (SEPSIS)
500.0000 mL | Freq: Once | INTRAVENOUS | Status: AC
  Administered 2013-09-28: 500 mL via INTRAVENOUS

## 2013-09-28 MED ORDER — SODIUM CHLORIDE 0.9 % IV SOLN
INTRAVENOUS | Status: DC
  Administered 2013-09-28: 15:00:00 via INTRAVENOUS

## 2013-09-28 MED ORDER — ACETAMINOPHEN 325 MG PO TABS
650.0000 mg | ORAL_TABLET | Freq: Four times a day (QID) | ORAL | Status: DC | PRN
  Administered 2013-09-28 – 2013-09-30 (×3): 650 mg via ORAL
  Filled 2013-09-28 (×3): qty 2

## 2013-09-28 MED ORDER — LEVOTHYROXINE SODIUM 125 MCG PO TABS
125.0000 ug | ORAL_TABLET | Freq: Every day | ORAL | Status: DC
Start: 2013-09-29 — End: 2013-09-30
  Administered 2013-09-29 – 2013-09-30 (×2): 125 ug via ORAL
  Filled 2013-09-28 (×4): qty 1

## 2013-09-28 MED ORDER — SODIUM CHLORIDE 0.9 % IV SOLN
INTRAVENOUS | Status: DC
  Administered 2013-09-28: 75 mL/h via INTRAVENOUS

## 2013-09-28 MED ORDER — ACETAMINOPHEN 650 MG RE SUPP: 650.0000 mg | Freq: Four times a day (QID) | RECTAL | Status: DC | PRN

## 2013-09-28 MED ORDER — HYDRALAZINE HCL 20 MG/ML IJ SOLN
10.0000 mg | Freq: Four times a day (QID) | INTRAMUSCULAR | Status: DC | PRN
  Administered 2013-09-28 – 2013-09-30 (×2): 10 mg via INTRAVENOUS
  Filled 2013-09-28: qty 1
  Filled 2013-09-28 (×2): qty 0.5
  Filled 2013-09-28: qty 1

## 2013-09-28 MED ORDER — LEVOFLOXACIN IN D5W 750 MG/150ML IV SOLN: 750.0000 mg | INTRAVENOUS | Status: DC

## 2013-09-28 MED ORDER — ONDANSETRON HCL 4 MG/2ML IJ SOLN
4.0000 mg | Freq: Once | INTRAMUSCULAR | Status: AC
  Administered 2013-09-28: 4 mg via INTRAVENOUS
  Filled 2013-09-28: qty 2

## 2013-09-28 NOTE — ED Provider Notes (Signed)
CSN: [id number]     Arrival date & time 09/28/13  1415 History   First MD Initiated Contact with Patient 09/28/13 1456     Chief Complaint  Patient presents with  . Fatigue     (Consider location/radiation/quality/duration/timing/severity/associated sxs/prior Treatment) Patient is a 73 y.o. female presenting with dizziness. The history is provided by the patient (son).  Dizziness Quality:  Lightheadedness and room spinning Severity:  Mild Onset quality:  Sudden Duration:  5 hours Timing:  Constant Progression:  Partially resolved Chronicity:  New Context comment:  While trying to stand Relieved by:  Being still Worsened by:  Turning head Ineffective treatments:  None tried Associated symptoms: nausea   Associated symptoms: no chest pain, no diarrhea, no headaches, no shortness of breath and no vomiting   Nausea:    Severity:  Mild   Onset quality:  Sudden   Duration:  5 hours   Timing:  Constant   Progression:  Improving   Past Medical History  Diagnosis Date  . COLONIC POLYPS, HX OF 02/07/2008  . DIABETES MELLITUS, TYPE II 10/27/2009  . GERD 10/14/2006  . HYPERLIPIDEMIA 10/14/2006  . HYPERTENSION 10/14/2006  . HYPOTHYROIDISM 10/14/2006  . IMPAIRED GLUCOSE TOLERANCE 01/06/2009  . KNEE PAIN, RIGHT 07/15/2008  . LOW BACK PAIN 08/04/2007  . Obesity   . DEGENERATIVE JOINT DISEASE 10/14/2006    03/31/11: Pt states arthritis in her hip.   Past Surgical History  Procedure Laterality Date  . Hemorrhoid surgery    . Tonsillectomy    . Left knee arthroscopic  April 2014    Dr. Durward Fortes   Family History  Problem Relation Age of Onset  . Heart disease Father    History  Substance Use Topics  . Smoking status: Never Smoker   . Smokeless tobacco: Never Used  . Alcohol Use: No   OB History   Grav Para Term Preterm Abortions TAB SAB Ect Mult Living                 Review of Systems  Constitutional: Positive for fatigue. Negative for fever.       Drowsiness  HENT:  Negative for congestion and drooling.   Eyes: Negative for pain.  Respiratory: Negative for cough and shortness of breath.   Cardiovascular: Negative for chest pain.  Gastrointestinal: Positive for nausea. Negative for vomiting, abdominal pain and diarrhea.  Genitourinary: Negative for dysuria and hematuria.  Musculoskeletal: Negative for back pain, gait problem and neck pain.  Skin: Negative for color change.  Neurological: Positive for dizziness. Negative for headaches.  Hematological: Negative for adenopathy.  Psychiatric/Behavioral: Negative for behavioral problems.  All other systems reviewed and are negative.     Allergies  Niacin and related; Lactose intolerance (gi); Amoxicillin; and Penicillins  Home Medications   Prior to Admission medications   Medication Sig Start Date End Date Taking? Authorizing Provider  metFORMIN (GLUCOPHAGE) 1000 MG tablet take 1 tablet by mouth twice a day with food 05/21/13  Yes Marletta Lor, MD  simvastatin (ZOCOR) 40 MG tablet TAKE 1 TABLET BY MOUTH EVERY EVENING FOR CHOLESTEROL 02/09/13  Yes Marletta Lor, MD  busPIRone (BUSPAR) 15 MG tablet Take 1 tablet (15 mg total) by mouth 2 (two) times daily as needed. 12/28/10   Marletta Lor, MD  Cholecalciferol (VITAMIN D-3 PO) Take by mouth.    Historical Provider, MD  glimepiride (AMARYL) 4 MG tablet TAKE 1/2 A TABLET BY MOUTH DAILY BEFORE BREAKFAST 03/29/13   Marletta Lor,  MD  HYDROcodone-acetaminophen (NORCO/VICODIN) 5-325 MG per tablet Take 1 tablet by mouth every 6 (six) hours as needed for moderate pain. 03/22/13   Marletta Lor, MD  levothyroxine Wilmer Floor, LEVOTHROID) 125 MCG tablet take 1 tablet by mouth once daily 10/03/12   Marletta Lor, MD  losartan-hydrochlorothiazide [facility]) 100-25 MG per tablet take 1 tablet by mouth once daily 03/14/13   Marletta Lor, MD  omeprazole (PRILOSEC) 20 MG capsule take 1 capsule by mouth once daily 12/16/12   Marletta Lor, MD  phentermine 37.5 MG capsule Take 1 capsule (37.5 mg total) by mouth every morning. 04/25/13   Marletta Lor, MD  propranolol (INDERAL) 20 MG tablet Take 1 tablet (20 mg total) by mouth 2 (two) times daily. 10/31/12   Marletta Lor, MD  VOLTAREN 1 % GEL Apply 2 g topically as needed.  11/13/12   Historical Provider, MD   BP 152/57  Pulse 70  Temp(Src) 95.8 F (35.4 C) (Rectal)  Resp 20  SpO2 94% Physical Exam  Nursing note and vitals reviewed. Constitutional: She is oriented to person, place, and time. She appears well-developed and well-nourished.  HENT:  Head: Normocephalic and atraumatic.  Mouth/Throat: No oropharyngeal exudate.  Mildly dry oral mucous membranes.  Eyes: Conjunctivae and EOM are normal. Pupils are equal, round, and reactive to light.  Neck: Normal range of motion. Neck supple.  Cardiovascular: Normal rate, regular rhythm, normal heart sounds and intact distal pulses.  Exam reveals no gallop and no friction rub.   No murmur heard. Pulmonary/Chest: Effort normal and breath sounds normal. No respiratory distress. She has no wheezes.  Abdominal: Soft. Bowel sounds are normal. There is no tenderness. There is no rebound and no guarding.  Musculoskeletal: Normal range of motion. She exhibits no edema and no tenderness.  Neurological: She is alert and oriented to person, place, and time.  alert, oriented x3 Mildly drowsy on exam. Opens eyes to command.  speech: normal in context and clarity memory: intact grossly cranial nerves II-XII: intact motor strength: full proximally and distally no involuntary movements or tremors sensation: intact to light touch diffusely  cerebellar: finger-to-nose and heel-to-shin intact gait: deferred, pt normally ambulates w/ a walker  Dizziness reproduced w/ movement of head. Nystagmus noted on eye exam w/ fast phase to the left. Nystagmus does not appear to change directions.    Skin: Skin is warm and dry.   Psychiatric: She has a normal mood and affect. Her behavior is normal.    ED Course  Procedures (including critical care time) Labs Review Labs Reviewed  COMPREHENSIVE METABOLIC PANEL - Abnormal; Notable for the following:    Glucose, Bld 198 (*)    Total Bilirubin 0.2 (*)    GFR calc non Af Amer 84 (*)    All other components within normal limits  URINALYSIS, ROUTINE W REFLEX MICROSCOPIC - Abnormal; Notable for the following:    Glucose, UA 100 (*)    Ketones, ur 15 (*)    All other components within normal limits  GLUCOSE, CAPILLARY - Abnormal; Notable for the following:    Glucose-Capillary 149 (*)    All other components within normal limits  CBG MONITORING, ED - Abnormal; Notable for the following:    Glucose-Capillary 169 (*)    All other components within normal limits  CBG MONITORING, ED - Abnormal; Notable for the following:    Glucose-Capillary 141 (*)    All other components within normal limits  MRSA PCR SCREENING  URINE CULTURE  CULTURE, BLOOD (ROUTINE X 2)  CULTURE, BLOOD (ROUTINE X 2)  CBC WITH DIFFERENTIAL  T4  LACTIC ACID, PLASMA  TROPONIN I  MAGNESIUM  PHOSPHORUS  BASIC METABOLIC PANEL  CBC  LEGIONELLA ANTIGEN, URINE  STREP PNEUMONIAE URINARY ANTIGEN  HIV ANTIBODY (ROUTINE TESTING)    Imaging Review Dg Chest 2 View  09/28/2013   CLINICAL DATA:  Acute mental status changes. Current history of hypertension and diabetes.  EXAM: CHEST  2 VIEW  COMPARISON:  None.  FINDINGS: Cardiac silhouette mildly enlarged. Hilar and mediastinal contours otherwise unremarkable. Streaky and patchy opacities in the left lower lobe. Linear scar or atelectasis involving right lower lobe. Lungs otherwise clear. Pulmonary vascularity normal. No pneumothorax. No pleural effusions. Degenerative changes involving the thoracic spine.  IMPRESSION: 1. Left lower lobe atelectasis and/or bronchopneumonia (I favor pneumonia). Linear atelectasis or scar involving the right lower lobe. 2.  Mild cardiomegaly without pulmonary edema.   Electronically Signed   By: Evangeline Dakin M.D.   On: 09/28/2013 15:42   Ct Head Wo Contrast  09/28/2013   CLINICAL DATA:  Altered mental status.  EXAM: CT HEAD WITHOUT CONTRAST  TECHNIQUE: Contiguous axial images were obtained from the base of the skull through the vertex without intravenous contrast.  COMPARISON:  None.  FINDINGS: No acute intracranial abnormality. Specifically, no hemorrhage, hydrocephalus, mass lesion, acute infarction, or significant intracranial injury. No acute calvarial abnormality. Visualized paranasal sinuses and mastoids clear. Orbital soft tissues unremarkable.  IMPRESSION: No acute intracranial abnormality.   Electronically Signed   By: Rolm Baptise M.D.   On: 09/28/2013 15:50     EKG Interpretation None      MDM   Final diagnoses:  Hypothermia  Community acquired pneumonia  Altered mental status    3:17 PM 73 y.o. female w hx of DM, hypothyroidism who presents with dizziness and drowsiness which began this morning. She states that sometime around mid morning, possibly 10 AM, she attempted to get up and began feeling very dizzy and nauseous. She notes her symptoms have slightly resolved since then and are now mild. She continues to feel drowsy and fatigue which he notes has been worsening over the past couple months. She denies any vomiting, diarrhea, or pain. She does note that she feels like she's had some left ankle swelling but denies injury. There is no obvious ankle swelling of my exam. Her son is here at the bedside and states that she is otherwise been in her normal state of health until this morning. She is slightly hypothermic her vital signs are otherwise unremarkable. She denies any chest pain, shortness of breath. No focal motor/sensory deficits. Pt a/o x 3. Will get screening labwork, imaging.   Found to have CAP. Will tx w/ levaquin. Will admit to hospitalist.    Blanchard Kelch, MD 09/28/13  2315

## 2013-09-28 NOTE — ED Notes (Signed)
Admitting MD at bedside. Will obtain rectal temp and blood cultures after admission assessment. Levaquin held at this time to obtain blood cultures.

## 2013-09-28 NOTE — ED Notes (Signed)
Pt  In from home by ems. Spoke to daughter on phone who didn't think daughter was acting appropriately. She called pt's son who came to check on pt and found her sitting in recliner, slumped over, arousable but very lethargic. Pt c/o feeling very tired and cold. Oral temp here would not register. Rectal temp 95.8. Pt sts she took regular dosages of thyroid med and glipizide today, and tylenol for neck pain. Also c/o L ankle swelling for past few days and says this has been making it hard to get around. No obvious swelling noted. Pt sts she hasn't been eating well, because her leg pain makes it hard for her to get around the house. Sts she doesn't remember the last time she has eaten.

## 2013-09-28 NOTE — Progress Notes (Signed)
  CARE MANAGEMENT ED NOTE 09/28/2013  Patient:  Leslie Benton, Leslie Benton   Account Number:  [id number]  Date Initiated:  09/28/2013  Documentation initiated by:  Livia Snellen  Subjective/Objective Assessment:   Patient presents to ED with hypothermia, dizziness and confusion.     Subjective/Objective Assessment Detail:   Temp 95.8 rectal chest xray revealed: 1. Left lower lobe atelectasis and/or bronchopneumonia (I favor  pneumonia). Linear atelectasis or scar involving the right lower  lobe.  2. Mild cardiomegaly without pulmonary edema.     Action/Plan:   Action/Plan Detail:   Anticipated DC Date:       Status Recommendation to Physician:   Result of Recommendation:    Other ED Services  Consult Working [place]  Other    Choice offered to / List presented to:  C-4 Adult Children          Status of service:  Completed, signed off  ED Comments:   ED Comments Detail:  EDCM spoke to patient's son in conference room.  Patient receiving care at this time.  Patient lives with her son and her husband.  Patient's son reports he and his father work all day  and don't get home until 22-7pm.  Patient's son and her husband work six days a week.  Patient's son works until [employer] on mondays.  Patient's son's name is Mia Creek [phone].  Patient's son reports the patient is usually able to perform her own ADL's without difficulty. Patient's son reports the patient has had private duty nursing services in the past.  Patient has a walker and a bedside commode at home.  Patient's son reports the patient Arletha Pili fixes her own meals.  EDCM provided patient's son with a list of home health agencies in [facility], printed list of private duty nursing services, infomation regarding the senior resources of [place] and [place] network.  EDCM explained with home health the patient may receive a visiting RN, PT, OT, aide and social worker if needed.  Also explained that private duty  nursing would be an out of pocket expense.  EDCM received permission from patient's son to place consult into [facility].  Patient's son appeared very anxious.  Patient's son asked EDCM repeatedly, "What do you think of this situation?  Do you think I did the right thing?" EDCM provided emotional support and reassured patient's son that we would take great care of her.  After cna finished patient care, physician entered the room, so EDCM stood up so that patient's son could speak to the physician privately. Patient's son stated, "where are you going?  You're coming with me right.'  Aren't you coming with me?"  EDCM asked why should she go with him, EDP in the room performing assesment with two other ED staff in the room.  EDCM will return to this room at another time.  EDCM returned to patient's room approximmately fifteen minutes later and patient was in radiology and patient's son has left. Patient and patient's son will need teaching reinforcement regarding home health services.  No further EDCM needs at this time.

## 2013-09-28 NOTE — Progress Notes (Signed)
ANTIBIOTIC CONSULT NOTE - INITIAL  Pharmacy Consult for Levaquin Indication: pneumonia  Allergies  Allergen Reactions  . Niacin And Related Hives and Swelling    No anaphalaxis, but strong reactions.  . Lactose Intolerance (Gi) Diarrhea  . Amoxicillin     REACTION: unspecified  . Penicillins     Unknown     Patient Measurements: Height: 5\' 3"  (160 cm) Weight: 213 lb 6.5 oz (96.8 kg) IBW/kg (Calculated) : 52.4  Vital Signs: Temp: 98.2 F (36.8 C) (06/12 1820) Temp src: Oral (06/12 1820) BP: 188/59 mmHg (06/12 1820) Pulse Rate: 79 (06/12 1820) Intake/Output from previous day:    Labs:  Recent Labs  09/28/13 1509  WBC 6.9  HGB 12.7  PLT 337  CREATININE 0.69   Estimated Creatinine Clearance: 68.4 ml/min (by C-G formula based on Cr of 0.69).   Microbiology: No results found for this or any previous visit (from the past 720 hour(s)).  Medical History: Past Medical History  Diagnosis Date  . COLONIC POLYPS, HX OF 02/07/2008  . DIABETES MELLITUS, TYPE II 10/27/2009  . GERD 10/14/2006  . HYPERLIPIDEMIA 10/14/2006  . HYPERTENSION 10/14/2006  . HYPOTHYROIDISM 10/14/2006  . IMPAIRED GLUCOSE TOLERANCE 01/06/2009  . KNEE PAIN, RIGHT 07/15/2008  . LOW BACK PAIN 08/04/2007  . Obesity   . DEGENERATIVE JOINT DISEASE 10/14/2006    03/31/11: Pt states arthritis in her hip.   Anti-infectives: 6/12 >> Levaquin >>   Assessment: 74 yoF admitted on 6/12 with AMS, dizziness, hypothermia and suspected CAP.  Pharmacy is consulted to dose Levaquin.  Tmax 98.2, hypothermic with Tmin 95.8  WBCs: 6.9  Renal: SCr 0.69, CrCl ~ 68 ml/min  Cultures pending   Goal of Therapy:  Appropriate abx dosing, eradication of infection.   Plan:   Levaquin 750mg  IV q24h  Follow up renal fxn and culture results.   Gretta Arab PharmD, BCPS Pager [phone] 09/28/2013 6:46 PM

## 2013-09-28 NOTE — H&P (Signed)
[facility] History and Physical  Leslie Benton OAC:[id number] DOB: 01/14/1941 DOA: 09/28/2013  Referring physician: Dr. Aline Brochure PCP: [name], MD   Chief Complaint: Hypothermia  HPI: Leslie Benton is a 73 y.o. female  With history of diabetes mellitus, hypertension, GERD, and hypothyroidism who presented to the hospital after transient altered mental status and dizziness which occurred when she was getting up out of bed this morning. Currently the dizziness and altered mental status has resolved. During evaluation in the emergency department patient was found to be hypothermic. Chest x-ray while in the ED reported left lower lobe atelectasis and/or bronchopneumonia of which the radiologist favored pneumonia. CT of head was obtained which reported no acute intracranial abnormality.  Given hypothermia and suspicious chest x-ray was decided to observe patient overnight and as such we were consulted for further admission evaluation and recommendations.  Patient reports history of poor oral intake given that she states she has difficulty preparing food at home. Currently patient has no new complaints   Review of Systems:  Constitutional:  No weight loss, night sweats, Fevers, chills, fatigue.  HEENT:  No headaches, Difficulty swallowing,Tooth/dental problems,Sore throat,  No sneezing, itching, ear ache, nasal congestion, post nasal drip,  Cardio-vascular:  No chest pain, Orthopnea, PND, swelling in lower extremities, anasarca, dizziness, palpitations  GI:  No heartburn, indigestion, abdominal pain, nausea, vomiting, diarrhea, change in bowel habits, loss of appetite  Resp:  No shortness of breath with exertion or at rest. No excess mucus, no productive cough, No non-productive cough, No coughing up of blood.No change in color of mucus.No wheezing.No chest wall deformity  Skin:  no rash or lesions.  GU:  no dysuria, change in color of urine, no urgency or frequency.  No flank pain.  Musculoskeletal:  No joint pain or swelling. No decreased range of motion. No back pain.  Psych:  No change in mood or affect. No depression or anxiety. No memory loss.   Past Medical History  Diagnosis Date  . COLONIC POLYPS, HX OF 02/07/2008  . DIABETES MELLITUS, TYPE II 10/27/2009  . GERD 10/14/2006  . HYPERLIPIDEMIA 10/14/2006  . HYPERTENSION 10/14/2006  . HYPOTHYROIDISM 10/14/2006  . IMPAIRED GLUCOSE TOLERANCE 01/06/2009  . KNEE PAIN, RIGHT 07/15/2008  . LOW BACK PAIN 08/04/2007  . Obesity   . DEGENERATIVE JOINT DISEASE 10/14/2006    03/31/11: Pt states arthritis in her hip.   Past Surgical History  Procedure Laterality Date  . Hemorrhoid surgery    . Tonsillectomy    . Left knee arthroscopic  April 2014    Dr. Durward Fortes   Social History:  reports that she has never smoked. She has never used smokeless tobacco. She reports that she does not drink alcohol or use illicit drugs.  Allergies  Allergen Reactions  . Niacin And Related Hives and Swelling    No anaphalaxis, but strong reactions.  . Lactose Intolerance (Gi) Diarrhea  . Amoxicillin     REACTION: unspecified  . Penicillins     Unknown     Family History  Problem Relation Age of Onset  . Heart disease Father      Prior to Admission medications   Medication Sig Start Date End Date Taking? Authorizing Provider  glimepiride (AMARYL) 4 MG tablet Take 2 mg by mouth daily with breakfast.   Yes Historical Provider, MD  glimepiride (AMARYL) 4 MG tablet TAKE 1/2 A TABLET BY MOUTH DAILY BEFORE BREAKFAST 03/29/13 09/28/13 Yes Marletta Lor, MD  levothyroxine (SYNTHROID, LEVOTHROID) 125  MCG tablet take 1 tablet by mouth once daily 10/03/12  Yes Marletta Lor, MD  losartan-hydrochlorothiazide [facility]) 100-25 MG per tablet take 1 tablet by mouth once daily 03/14/13  Yes Marletta Lor, MD  metFORMIN (GLUCOPHAGE) 1000 MG tablet Take 1,000 mg by mouth 2 (two) times daily with a meal.   Yes Historical  Provider, MD  omeprazole (PRILOSEC) 20 MG capsule take 1 capsule by mouth once daily 12/16/12  Yes Marletta Lor, MD  phentermine 37.5 MG capsule Take 1 capsule (37.5 mg total) by mouth every morning. 04/25/13  Yes Marletta Lor, MD   Physical Exam: Filed Vitals:   09/28/13 1727  BP:   Pulse:   Temp: 96.9 F (36.1 C)  Resp:     BP 167/75  Pulse 70  Temp(Src) 96.9 F (36.1 C) (Rectal)  Resp 15  SpO2 99%  General:  Appears calm and comfortable, alert and awake Eyes: PERRL, normal lids, irises & conjunctiva ENT: grossly normal hearing, lips & tongue Neck: no LAD, masses or thyromegaly Cardiovascular: RRR, no m/r/g.  Telemetry: SR, no arrhythmias  Respiratory: CTA bilaterally, no w/r/r. Normal respiratory effort. Abdomen: soft, nt, nd Skin: no rash or induration seen on limited exam Musculoskeletal: grossly normal tone BUE/BLE Psychiatric: grossly normal mood and affect, speech fluent and appropriate Neurologic: No facial asymmetry, answers questions appropriately, moves extremities equally.          Labs on Admission:  Basic Metabolic Panel:  Recent Labs Lab 09/28/13 1509  NA 139  K 4.2  CL 100  CO2 25  GLUCOSE 198*  BUN 18  CREATININE 0.69  CALCIUM 9.3   Liver Function Tests:  Recent Labs Lab 09/28/13 1509  AST 26  ALT 22  ALKPHOS 86  BILITOT 0.2*  PROT 7.4  ALBUMIN 4.1   No results found for this basename: LIPASE, AMYLASE,  in the last 168 hours No results found for this basename: AMMONIA,  in the last 168 hours CBC:  Recent Labs Lab 09/28/13 1509  WBC 6.9  NEUTROABS 4.9  HGB 12.7  HCT 38.8  MCV 88.4  PLT 337   Cardiac Enzymes:  Recent Labs Lab 09/28/13 1509  TROPONINI <0.30    BNP (last 3 results) No results found for this basename: PROBNP,  in the last 8760 hours CBG:  Recent Labs Lab 09/28/13 1604  GLUCAP 169*    Radiological Exams on Admission: Dg Chest 2 View  09/28/2013   CLINICAL DATA:  Acute mental status  changes. Current history of hypertension and diabetes.  EXAM: CHEST  2 VIEW  COMPARISON:  None.  FINDINGS: Cardiac silhouette mildly enlarged. Hilar and mediastinal contours otherwise unremarkable. Streaky and patchy opacities in the left lower lobe. Linear scar or atelectasis involving right lower lobe. Lungs otherwise clear. Pulmonary vascularity normal. No pneumothorax. No pleural effusions. Degenerative changes involving the thoracic spine.  IMPRESSION: 1. Left lower lobe atelectasis and/or bronchopneumonia (I favor pneumonia). Linear atelectasis or scar involving the right lower lobe. 2. Mild cardiomegaly without pulmonary edema.   Electronically Signed   By: Evangeline Dakin M.D.   On: 09/28/2013 15:42   Ct Head Wo Contrast  09/28/2013   CLINICAL DATA:  Altered mental status.  EXAM: CT HEAD WITHOUT CONTRAST  TECHNIQUE: Contiguous axial images were obtained from the base of the skull through the vertex without intravenous contrast.  COMPARISON:  None.  FINDINGS: No acute intracranial abnormality. Specifically, no hemorrhage, hydrocephalus, mass lesion, acute infarction, or significant intracranial injury. No  acute calvarial abnormality. Visualized paranasal sinuses and mastoids clear. Orbital soft tissues unremarkable.  IMPRESSION: No acute intracranial abnormality.   Electronically Signed   By: Rolm Baptise M.D.   On: 09/28/2013 15:50    EKG: Independently reviewed. Sinus rhythm with no ST elevations or depressions  Assessment/Plan Active Problems:   Hypothermia - Etiology uncertain given reports of chest x-ray suspicious for possible pneumonia will monitor patient overnight. - Patient obtain warming blanket while in the ED. We'll recheck rectal temperature if patient still hypothermic will continue per hugger/warming blanket and plan on transitioning to step down unit    Community acquired pneumonia - Will obtain routine lab and blood cultures - Agree with treating with Levaquin per pharmacy  consult - Obtain physical therapy next a.m.    Altered mental status - Patient is alert and oriented x3. Answering questions appropriately. May have been transient based on history suspect patient had orthostatic hypotension which contributed to initial altered mental status  GERD -Continue PPI we'll patient in house  Hypothyroidism -Continue home Synthroid regimen.  DM -Will hold metformin while patient in house. Continue glyburide. Sliding scale insulin with routine CBG monitoring  Dizziness -I suspect this is related to poor oral intake as patient admits she has not been eating or drinking as well as she should be. She reports that she lives by herself sometimes is not able to prepare food she would want to. She is currently laying down and alert and awake and answering questions appropriately denying any dizziness. -PT evaluation  DVT prophylaxis -Heparin subcutaneous  Code Status: full Family Communication:  None at bedside Disposition Plan: Awaiting evaluation by physical therapy. Given history of poor oral intake patient may require more help at home versus skilled nursing facility placement  Time spent: > 55 minutes  [name] [facility] Pager [phone]  **Disclaimer: This note may have been dictated with voice recognition software. Similar sounding words can inadvertently be transcribed and this note may contain transcription errors which may not have been corrected upon publication of note.**

## 2013-09-28 NOTE — ED Notes (Signed)
Bed: WA04 Expected date:  Expected time:  Means of arrival:  Comments: ems- elderly, more lethargic than usual

## 2013-09-28 NOTE — ED Notes (Addendum)
[place]

## 2013-09-29 ENCOUNTER — Inpatient Hospital Stay (HOSPITAL_COMMUNITY): Payer: Medicare Other

## 2013-09-29 DIAGNOSIS — R0602 Shortness of breath: Secondary | ICD-10-CM | POA: Diagnosis not present

## 2013-09-29 DIAGNOSIS — T68XXXA Hypothermia, initial encounter: Secondary | ICD-10-CM | POA: Diagnosis not present

## 2013-09-29 LAB — HIV ANTIBODY (ROUTINE TESTING W REFLEX): HIV: NONREACTIVE

## 2013-09-29 LAB — BASIC METABOLIC PANEL
BUN: 13 mg/dL (ref 6–23)
CALCIUM: 8.8 mg/dL (ref 8.4–10.5)
CO2: 26 mEq/L (ref 19–32)
CREATININE: 0.8 mg/dL (ref 0.50–1.10)
Chloride: 102 mEq/L (ref 96–112)
GFR calc Af Amer: 82 mL/min — ABNORMAL LOW (ref >90)
GFR calc non Af Amer: 71 mL/min — ABNORMAL LOW (ref >90)
GLUCOSE: 175 mg/dL — AB (ref 70–99)
Potassium: 4 mEq/L (ref 3.7–5.3)
Sodium: 140 mEq/L (ref 137–147)

## 2013-09-29 LAB — GLUCOSE, CAPILLARY
GLUCOSE-CAPILLARY: 137 mg/dL — AB (ref 70–99)
Glucose-Capillary: 151 mg/dL — ABNORMAL HIGH (ref 70–99)
Glucose-Capillary: 150 mg/dL — ABNORMAL HIGH (ref 70–99)
Glucose-Capillary: 187 mg/dL — ABNORMAL HIGH (ref 70–99)
Glucose-Capillary: 150 mg/dL — ABNORMAL HIGH (ref 70–99)

## 2013-09-29 LAB — CBC
HCT: 37.8 % (ref 36.0–46.0)
Hemoglobin: 12.2 g/dL (ref 12.0–15.0)
MCH: 28.9 pg (ref 26.0–34.0)
MCHC: 32.3 g/dL (ref 30.0–36.0)
MCV: 89.6 fL (ref 78.0–100.0)
PLATELETS: 346 10*3/uL (ref 150–400)
RBC: 4.22 MIL/uL (ref 3.87–5.11)
RDW: 13.9 % (ref 11.5–15.5)
WBC: 8 10*3/uL (ref 4.0–10.5)

## 2013-09-29 LAB — URINE CULTURE
Colony Count: NO GROWTH
Culture: NO GROWTH

## 2013-09-29 LAB — STREP PNEUMONIAE URINARY ANTIGEN: STREP PNEUMO URINARY ANTIGEN: NEGATIVE

## 2013-09-29 NOTE — Progress Notes (Signed)
[facility] PROGRESS NOTE  Leslie Benton YIR:[id number] DOB: 1940-04-30 DOA: 09/28/2013 PCP: [name], MD  Assessment/Plan:  Active Problems:  Hypothermia  - Resolved after warming blanket  - transitioning out of step down unit. - Etiology uncertain, no source of infection identified on repeat chest x ray  Atelectasis - Was initially thought patient had CAP on initial chest x ray. Repeat chest x ray reports improved aeration. - Will discontinue Levaquin given negative chest x ray, normal WBC levels and pt afebrile  Altered mental status  - Patient is alert and oriented x3, resolved. - May have been transient based on history suspect patient had orthostatic hypotension and hypothermia which most likely contributed to initial altered mental status   GERD  -Continue PPI we'll patient in house   Hypothyroidism  -Continue home Synthroid regimen.   DM  -Will hold metformin while patient in house. Continue glyburide. Sliding scale insulin with routine CBG monitoring   Dizziness  -I suspect this is related to poor oral intake as patient admits she has not been eating or drinking as well as she should be. She reports that she lives by herself sometimes is not able to prepare food she would want to. - Resolved, may have been due to orthostatic blood pressure changes given that this occurred when she stood up yesterday. -PT evaluation pending. Discharge planning after evaluation reported.  DVT prophylaxis  -Heparin subcutaneous   Code Status: fulll Family Communication: No family at bedside Disposition Plan: Pending improvement in condition   Consultants:  none  Procedures:  None  Antibiotics:  Levaquin >>>09/29/13  HPI/Subjective: Pt seems to have a lot of vague complaints. Otherwise denies any dizziness.  States that she has discomfort at her left knee which is chronic. She reports seeing an orthopaedic specialist for that.  Objective: Filed  Vitals:   09/29/13 0929  BP: 119/85  Pulse: 86  Temp:   Resp: 12    Intake/Output Summary (Last 24 hours) at 09/29/13 0953 Last data filed at 09/29/13 0942  Gross per 24 hour  Intake 1152.5 ml  Output   1025 ml  Net  127.5 ml   Filed Weights   09/28/13 1820 09/29/13 0428  Weight: 96.8 kg (213 lb 6.5 oz) 96.8 kg (213 lb 6.5 oz)    Exam:   General:  Pt in NAD, alert and awake  Cardiovascular: RRR, no MRG  Respiratory: CTA BL, no wheezes  Abdomen: soft, NT, ND  Musculoskeletal: no cyanosis, some discomfort on palpation over left knee but no bony deformity or overlying cellulitis on examination   Data Reviewed: Basic Metabolic Panel:  Recent Labs Lab 09/28/13 1509 09/28/13 1910 09/29/13 0316  NA 139  --  140  K 4.2  --  4.0  CL 100  --  102  CO2 25  --  26  GLUCOSE 198*  --  175*  BUN 18  --  13  CREATININE 0.69  --  0.80  CALCIUM 9.3  --  8.8  MG  --  2.1  --   PHOS  --  3.4  --    Liver Function Tests:  Recent Labs Lab 09/28/13 1509  AST 26  ALT 22  ALKPHOS 86  BILITOT 0.2*  PROT 7.4  ALBUMIN 4.1   No results found for this basename: LIPASE, AMYLASE,  in the last 168 hours No results found for this basename: AMMONIA,  in the last 168 hours CBC:  Recent Labs Lab 09/28/13 1509 09/29/13 0316  WBC 6.9 8.0  NEUTROABS 4.9  --   HGB 12.7 12.2  HCT 38.8 37.8  MCV 88.4 89.6  PLT 337 346   Cardiac Enzymes:  Recent Labs Lab 09/28/13 1509  TROPONINI <0.30   BNP (last 3 results) No results found for this basename: PROBNP,  in the last 8760 hours CBG:  Recent Labs Lab 09/28/13 1604 09/28/13 1724 09/28/13 1917 09/29/13 0426  GLUCAP 169* 141* 149* 151*    Recent Results (from the past 240 hour(s))  MRSA PCR SCREENING     Status: None   Collection Time    09/28/13  6:31 PM      Result Value Ref Range Status   MRSA by PCR NEGATIVE  NEGATIVE Final   Comment:            The GeneXpert MRSA Assay (FDA     approved for NASAL specimens      only), is one component of a     comprehensive MRSA colonization     surveillance program. It is not     intended to diagnose MRSA     infection nor to guide or     monitor treatment for     MRSA infections.     Studies: Dg Chest 2 View  09/29/2013   CLINICAL DATA:  Shortness of breath and altered mental status.  EXAM: CHEST  2 VIEW  COMPARISON:  09/28/2013  FINDINGS: Two views of the chest again demonstrate mild enlargement of the cardiac silhouette. There is improved aeration at the left lung suggesting decreased atelectasis. No evidence for focal airspace disease or pulmonary edema. No acute bone abnormality. Degenerative changes in the lower thoracic spine. There continues to be slightly low lung volumes.  IMPRESSION: Improved aeration at the left lung base.  No focal chest disease.   Electronically Signed   By: Markus Daft M.D.   On: 09/29/2013 08:42   Dg Chest 2 View  09/28/2013   CLINICAL DATA:  Acute mental status changes. Current history of hypertension and diabetes.  EXAM: CHEST  2 VIEW  COMPARISON:  None.  FINDINGS: Cardiac silhouette mildly enlarged. Hilar and mediastinal contours otherwise unremarkable. Streaky and patchy opacities in the left lower lobe. Linear scar or atelectasis involving right lower lobe. Lungs otherwise clear. Pulmonary vascularity normal. No pneumothorax. No pleural effusions. Degenerative changes involving the thoracic spine.  IMPRESSION: 1. Left lower lobe atelectasis and/or bronchopneumonia (I favor pneumonia). Linear atelectasis or scar involving the right lower lobe. 2. Mild cardiomegaly without pulmonary edema.   Electronically Signed   By: Evangeline Dakin M.D.   On: 09/28/2013 15:42   Ct Head Wo Contrast  09/28/2013   CLINICAL DATA:  Altered mental status.  EXAM: CT HEAD WITHOUT CONTRAST  TECHNIQUE: Contiguous axial images were obtained from the base of the skull through the vertex without intravenous contrast.  COMPARISON:  None.  FINDINGS: No acute  intracranial abnormality. Specifically, no hemorrhage, hydrocephalus, mass lesion, acute infarction, or significant intracranial injury. No acute calvarial abnormality. Visualized paranasal sinuses and mastoids clear. Orbital soft tissues unremarkable.  IMPRESSION: No acute intracranial abnormality.   Electronically Signed   By: Rolm Baptise M.D.   On: 09/28/2013 15:50    Scheduled Meds: . glimepiride  2 mg Oral Q breakfast  . heparin  5,000 Units Subcutaneous 3 times per day  . insulin aspart  0-15 Units Subcutaneous TID WC  . levofloxacin (LEVAQUIN) IV  750 mg Intravenous Q24H  . levothyroxine  125 mcg Oral  QAC breakfast  . pantoprazole  40 mg Oral Daily   Continuous Infusions:    Time spent: > 35 minutes    [name]  [facility] Pager [phone]. If 7PM-7AM, please contact night-coverage at [email or web address], password [facility] 09/29/2013, 9:53 AM  LOS: 1 day

## 2013-09-29 NOTE — Evaluation (Signed)
Physical Therapy Evaluation Patient Details Name: Leslie Benton MRN: [id number] DOB: 11/13/1940 Today's Date: 09/29/2013   History of Present Illness  Patient is a 73 y/o female admitted with dizziness, AMS and weakness.  Found to be hypothermic and possible PNA by CXR.  Has PMH of left knee scope, DM, colonic polyps.  Clinical Impression  Patient presents with decreased independence and safety with mobility.  She will benefit from skilled PT in the acute setting to allow return home with family assist if able to provide or hire 24 hour assist and HHPT.  Would need STSNF if family unable to arrange for assist while they are at work.  Did not discuss with patient as she is already anxious about her symptoms and feels she has had a stroke.    Follow Up Recommendations Home health PT;Supervision/Assistance - 24 hour    Equipment Recommendations  3in1 (PT)    Recommendations for Other Services       Precautions / Restrictions Precautions Precautions: Fall      Mobility  Bed Mobility Overal bed mobility: Needs Assistance Bed Mobility: Rolling;Sidelying to Sit Rolling: Min assist Sidelying to sit: Min assist       General bed mobility comments: cues for using rail to assist herself up and for technique  Transfers Overall transfer level: Needs assistance Equipment used: Rolling walker (2 wheeled) Transfers: Sit to/from Stand Sit to Stand: Min assist         General transfer comment: for safety due to weakness, also cues for hand placement  Ambulation/Gait Ambulation/Gait assistance: Min assist Ambulation Distance (Feet): 80 Feet Assistive device: Rolling walker (2 wheeled) Gait Pattern/deviations: Step-through pattern;Trunk flexed;Decreased stride length     General Gait Details: cues for head up, eyes forward, trunk extension and proximity to walker  Stairs            Wheelchair Mobility    Modified Rankin (Stroke Patients Only)       Balance Overall  balance assessment: Needs assistance         Standing balance support: Bilateral upper extremity supported Standing balance-Leahy Scale: Poor Standing balance comment: feels unsteady and difficulty focusing up on her feet                             Pertinent Vitals/Pain Multiple complaints of nausea, weakness and inability to focus without c/o pain    Home Living Family/patient expects to be discharged to:: Private residence Living Arrangements: Spouse/significant other;Children Available Help at Discharge: Family;Available PRN/intermittently Type of Home: House       Home Layout: One level Home Equipment: Walker - 2 wheels Additional Comments: has tub shower no grabbars    Prior Function Level of Independence: Independent with assistive device(s)         Comments: son and spouse work all day.  Recent difficulty fixing herself meals     Hand Dominance        Extremity/Trunk Assessment               Lower Extremity Assessment: Generalized weakness      Cervical / Trunk Assessment: Kyphotic  Communication   Communication: No difficulties  Cognition Arousal/Alertness: Awake/alert Behavior During Therapy: Anxious Overall Cognitive Status: No family/caregiver present to determine baseline cognitive functioning (seems anxious and perseverates on complaints and unable to focus on one activity before asking to comlplete another)  General Comments      Exercises        Assessment/Plan    PT Assessment Patient needs continued PT services  PT Diagnosis Abnormality of gait;Generalized weakness   PT Problem List Decreased strength;Decreased activity tolerance;Decreased balance;Decreased mobility;Decreased safety awareness;Decreased cognition;Decreased knowledge of use of DME  PT Treatment Interventions DME instruction;Gait training;Functional mobility training;Therapeutic activities;Patient/family education;Balance  training;Therapeutic exercise   PT Goals (Current goals can be found in the Care Plan section) Acute Rehab PT Goals Patient Stated Goal: To determine cause of her symptoms PT Goal Formulation: Patient unable to participate in goal setting Time For Goal Achievement: 10/13/13 Potential to Achieve Goals: Fair    Frequency Min 3X/week   Barriers to discharge        Co-evaluation               End of Session Equipment Utilized During Treatment: Gait belt Activity Tolerance: Patient limited by fatigue Patient left: in chair;with call bell/phone within reach Nurse Communication: Mobility status         Time: 9629-5284 PT Time Calculation (min): 30 min   Charges:   PT Evaluation $Initial PT Evaluation Tier I: 1 Procedure PT Treatments $Gait Training: 8-22 mins   PT G Codes:          WYNN,CYNDI 2013-10-23, 11:51 AM Magda Kiel, [place] 2013-10-23

## 2013-09-30 DIAGNOSIS — T68XXXA Hypothermia, initial encounter: Secondary | ICD-10-CM | POA: Diagnosis not present

## 2013-09-30 LAB — LEGIONELLA ANTIGEN, URINE: Legionella Antigen, Urine: NEGATIVE

## 2013-09-30 LAB — GLUCOSE, CAPILLARY
GLUCOSE-CAPILLARY: 188 mg/dL — AB (ref 70–99)
Glucose-Capillary: 173 mg/dL — ABNORMAL HIGH (ref 70–99)

## 2013-09-30 NOTE — Progress Notes (Addendum)
INITIAL NUTRITION ASSESSMENT  DOCUMENTATION CODES Per approved criteria  -Morbid Obesity   INTERVENTION: - Encourage pt to consume adequate nutrition po with a variety of fruits and vegetables and high protein foods. Encourage frequent meals and snacks - Provided pt with handout "Wt loss tips" from the Academy of [employer] per pt request - RD will continue to monitor  NUTRITION DIAGNOSIS: Obesity related to reported intake greater than estimated needs as evidenced by BMI of 37.9  Goal: Pt to meet >/= 90% of their estimated nutrition needs   Monitor:  Wt trends, po intake, levels of knowledge, labs  Reason for Assessment: Consult- nutrition assessment  73 y.o. female  Admitting Dx: <principal problem not specified>  ASSESSMENT: 73 y.o. female with history of diabetes mellitus, hypertension, GERD, and hypothyroidism who presented to the hospital after transient altered mental status and dizziness which occurred when she was getting up out of bed this morning. Currently the dizziness and altered mental status has resolved. During evaluation in the emergency department patient was found to be hypothermic. Chest x-ray while in the ED reported left lower lobe atelectasis and/or bronchopneumonia of which the radiologist favored pneumonia.   - Pt reports that she is eating well - She reports no recent weight changes.  - Pt said that she would be interested in weight loss - She said that she has no need for nutritional supplements at this time. - Pt reports that she feels much better than she did when she was admitted to the hospital. - During RD visit, pt ate 100% of lunch and asked for more vegetables. - No signs of fat or muscle wasting  Height: Ht Readings from Last 1 Encounters:  09/29/13 5\' 3"  (1.6 m)    Weight: Wt Readings from Last 1 Encounters:  09/29/13 213 lb 6.5 oz (96.8 kg)    Ideal Body Weight: 52.4 kg  % Ideal Body Weight: 185%  Wt Readings from  Last 10 Encounters:  09/29/13 213 lb 6.5 oz (96.8 kg)  07/24/13 212 lb (96.163 kg)  04/25/13 207 lb (93.895 kg)  12/21/12 200 lb (90.719 kg)  10/06/12 203 lb (92.08 kg)  06/08/12 209 lb (94.802 kg)  02/24/12 207 lb (93.895 kg)  11/18/11 200 lb (90.719 kg)  10/28/11 197 lb 1.6 oz (89.404 kg)  09/08/11 199 lb 8 oz (90.493 kg)    Usual Body Weight: 213 lbs  % Usual Body Weight: 100%  BMI:  Body mass index is 37.81 kg/(m^2).  Estimated Nutritional Needs: Kcal: 1600-1800  Protein: 90-100 g Fluid: ~1.8 L/day  Skin: WNL  Diet Order: Carb Control  EDUCATION NEEDS: -Education needs addressed   Intake/Output Summary (Last 24 hours) at 09/30/13 1344 Last data filed at 09/30/13 1255  Gross per 24 hour  Intake    960 ml  Output      0 ml  Net    960 ml    Last BM: prior to admission   Labs:   Recent Labs Lab 09/28/13 1509 09/28/13 1910 09/29/13 0316  NA 139  --  140  K 4.2  --  4.0  CL 100  --  102  CO2 25  --  26  BUN 18  --  13  CREATININE 0.69  --  0.80  CALCIUM 9.3  --  8.8  MG  --  2.1  --   PHOS  --  3.4  --   GLUCOSE 198*  --  175*    CBG (last 3)  Recent Labs  09/29/13 2140 09/30/13 0723 09/30/13 1122  GLUCAP 137* 188* 173*    Scheduled Meds: . glimepiride  2 mg Oral Q breakfast  . heparin  5,000 Units Subcutaneous 3 times per day  . insulin aspart  0-15 Units Subcutaneous TID WC  . levothyroxine  125 mcg Oral QAC breakfast  . pantoprazole  40 mg Oral Daily    Continuous Infusions:   Past Medical History  Diagnosis Date  . COLONIC POLYPS, HX OF 02/07/2008  . DIABETES MELLITUS, TYPE II 10/27/2009  . GERD 10/14/2006  . HYPERLIPIDEMIA 10/14/2006  . HYPERTENSION 10/14/2006  . HYPOTHYROIDISM 10/14/2006  . IMPAIRED GLUCOSE TOLERANCE 01/06/2009  . KNEE PAIN, RIGHT 07/15/2008  . LOW BACK PAIN 08/04/2007  . Obesity   . DEGENERATIVE JOINT DISEASE 10/14/2006    03/31/11: Pt states arthritis in her hip.    Past Surgical History  Procedure  Laterality Date  . Hemorrhoid surgery    . Tonsillectomy    . Left knee arthroscopic  April 2014    Dr. Rozanna Boer RD, LDN

## 2013-09-30 NOTE — Progress Notes (Signed)
CARE MANAGEMENT NOTE 09/30/2013  Patient:  KEONTA, MONCEAUX   Account Number:  [id number]  Date Initiated:  09/30/2013  Documentation initiated by:  [facility]  Subjective/Objective Assessment:   dizziness     Action/Plan:   Anticipated DC Date:  09/30/2013   Anticipated DC Plan:  [place]  CM consult      [facility] Choice  HOME HEALTH   Choice offered to / List presented to:  C-1 Patient        [place] arranged  [place] [facility]      [place].   Status of service:  Completed, signed off Medicare Important Message given?  NA - LOS <3 / Initial given by admissions (If response is "NO", the following Medicare IM given date fields will be blank) Date Medicare IM given:   Date Additional Medicare IM given:    Discharge Disposition:  [place]  Per UR Regulation:    If discussed at Long Length of Stay Meetings, dates discussed:    Comments:  09/30/2013 1745 NCM spoke to pt's husband. Offered choice for [facility] and husband requested [facility]. Notified [facility] rep for [facility] for scheduled dc home today. Jonnie Finner RN CCM Case Mgmt phone [phone]

## 2013-09-30 NOTE — Discharge Summary (Signed)
Physician Discharge Summary  [name] LKG:[id number] DOB: 11/10/1940 DOA: 09/28/2013  PCP: [name], MD  Admit date: 09/28/2013 Discharge date: 09/30/2013  Time spent: > 35 minutes  Recommendations for Outpatient Follow-up:  1. Please be sure to follow up with your primary care physician 2. You are to continue physical therapy while at home  Discharge Diagnoses:  Active Problems:   HYPOTHYROIDISM   DIABETES MELLITUS, TYPE II   HYPERTENSION   GERD   Hypothermia   Community acquired pneumonia   Altered mental status   CAP (community acquired pneumonia)   Discharge Condition: stable  Diet recommendation: low sodium heart healthy  Filed Weights   09/28/13 1820 09/29/13 0428  Weight: 96.8 kg (213 lb 6.5 oz) 96.8 kg (213 lb 6.5 oz)    History of present illness/Hospital Course:  Leslie Benton is a 73 y.o. female  With history of diabetes mellitus, hypertension, GERD, and hypothyroidism who presented to the hospital after transient altered mental status and dizziness which occurred when she was getting up out of bed and hypothermia.  Active Problems:  Hypothermia  - Etiology uncertain, no source of infection identified on repeat chest x ray   Atelectasis  - Was initially thought patient had CAP on initial chest x ray. Repeat chest x ray reports improved aeration.  - Will discontinue Levaquin given negative chest x ray, normal WBC levels and pt afebrile   Altered mental status  - Patient is alert and oriented x3, resolved.  - May have been transient based on history suspect patient had orthostatic hypotension and hypothermia which most likely contributed to initial altered mental status   GERD  -Continue PPI we'll patient in house   Hypothyroidism  -Continue home Synthroid regimen.   DM  -Pt to continue home regimen at home.   Dizziness  -I suspect this is related to poor oral intake as patient admits she has not been eating or drinking as well as  she should be. - Resolved, may have been due to orthostatic blood pressure changes given that this occurred when she stood up yesterday.  -PT evaluation recommended home health with PT and 3 in 1 for equipment recommendations.   Procedures:  None  Consultations:  none  Discharge Exam: Filed Vitals:   09/30/13 0716  BP: 163/75  Pulse: 87  Temp:   Resp: 14    General: Pt in NAD, alert and awake Cardiovascular: RRR, no MRG Respiratory: CTA BL, no wheezes  Discharge Instructions You were cared for by a hospitalist during your hospital stay. If you have any questions about your discharge medications or the care you received while you were in the hospital after you are discharged, you can call the unit and asked to speak with the hospitalist on call if the hospitalist that took care of you is not available. Once you are discharged, your primary care physician will handle any further medical issues. Please note that NO REFILLS for any discharge medications will be authorized once you are discharged, as it is imperative that you return to your primary care physician (or establish a relationship with a primary care physician if you do not have one) for your aftercare needs so that they can reassess your need for medications and monitor your lab values.  Discharge Instructions   Call MD for:  extreme fatigue    Complete by:  As directed      Call MD for:  temperature >100.4    Complete by:  As directed  Diet - low sodium heart healthy    Complete by:  As directed      Increase activity slowly    Complete by:  As directed             Medication List    STOP taking these medications       phentermine 37.5 MG capsule      TAKE these medications       glimepiride 4 MG tablet  Commonly known as:  AMARYL  Take 2 mg by mouth daily with breakfast.     levothyroxine 125 MCG tablet  Commonly known as:  SYNTHROID, LEVOTHROID  take 1 tablet by mouth once daily      losartan-hydrochlorothiazide 100-25 MG per tablet  Commonly known as:  HYZAAR  take 1 tablet by mouth once daily     metFORMIN 1000 MG tablet  Commonly known as:  GLUCOPHAGE  Take 1,000 mg by mouth 2 (two) times daily with a meal.     omeprazole 20 MG capsule  Commonly known as:  PRILOSEC  take 1 capsule by mouth once daily       Allergies  Allergen Reactions  . Niacin And Related Hives and Swelling    No anaphalaxis, but strong reactions.  . Lactose Intolerance (Gi) Diarrhea  . Amoxicillin     REACTION: unspecified  . Penicillins     Unknown       The results of significant diagnostics from this hospitalization (including imaging, microbiology, ancillary and laboratory) are listed below for reference.    Significant Diagnostic Studies: Dg Chest 2 View  09/29/2013   CLINICAL DATA:  Shortness of breath and altered mental status.  EXAM: CHEST  2 VIEW  COMPARISON:  09/28/2013  FINDINGS: Two views of the chest again demonstrate mild enlargement of the cardiac silhouette. There is improved aeration at the left lung suggesting decreased atelectasis. No evidence for focal airspace disease or pulmonary edema. No acute bone abnormality. Degenerative changes in the lower thoracic spine. There continues to be slightly low lung volumes.  IMPRESSION: Improved aeration at the left lung base.  No focal chest disease.   Electronically Signed   By: Markus Daft M.D.   On: 09/29/2013 08:42   Dg Chest 2 View  09/28/2013   CLINICAL DATA:  Acute mental status changes. Current history of hypertension and diabetes.  EXAM: CHEST  2 VIEW  COMPARISON:  None.  FINDINGS: Cardiac silhouette mildly enlarged. Hilar and mediastinal contours otherwise unremarkable. Streaky and patchy opacities in the left lower lobe. Linear scar or atelectasis involving right lower lobe. Lungs otherwise clear. Pulmonary vascularity normal. No pneumothorax. No pleural effusions. Degenerative changes involving the thoracic spine.   IMPRESSION: 1. Left lower lobe atelectasis and/or bronchopneumonia (I favor pneumonia). Linear atelectasis or scar involving the right lower lobe. 2. Mild cardiomegaly without pulmonary edema.   Electronically Signed   By: Evangeline Dakin M.D.   On: 09/28/2013 15:42   Ct Head Wo Contrast  09/28/2013   CLINICAL DATA:  Altered mental status.  EXAM: CT HEAD WITHOUT CONTRAST  TECHNIQUE: Contiguous axial images were obtained from the base of the skull through the vertex without intravenous contrast.  COMPARISON:  None.  FINDINGS: No acute intracranial abnormality. Specifically, no hemorrhage, hydrocephalus, mass lesion, acute infarction, or significant intracranial injury. No acute calvarial abnormality. Visualized paranasal sinuses and mastoids clear. Orbital soft tissues unremarkable.  IMPRESSION: No acute intracranial abnormality.   Electronically Signed   By: Rolm Baptise M.D.  On: 09/28/2013 15:50    Microbiology: Recent Results (from the past 240 hour(s))  URINE CULTURE     Status: None   Collection Time    09/28/13  4:00 PM      Result Value Ref Range Status   Specimen Description URINE, CLEAN CATCH   Final   Special Requests NONE   Final   Culture  Setup Time     Final   Value: 09/28/2013 22:35     Performed at [place]     Final   Value: NO GROWTH     Performed at [facility]   Culture     Final   Value: NO GROWTH     Performed at [facility]   Report Status 09/29/2013 FINAL   Final  CULTURE, BLOOD (ROUTINE X 2)     Status: None   Collection Time    09/28/13  5:38 PM      Result Value Ref Range Status   Specimen Description BLOOD RIGHT HAND    Final   Special Requests BOTTLES DRAWN AEROBIC AND ANAEROBIC 5CC   Final   Culture  Setup Time     Final   Value: 09/28/2013 22:26     Performed at [facility]   Culture     Final   Value:        BLOOD CULTURE RECEIVED NO GROWTH TO DATE CULTURE WILL BE HELD FOR 5 DAYS BEFORE ISSUING A  FINAL NEGATIVE REPORT     Performed at [facility]   Report Status PENDING   Incomplete  CULTURE, BLOOD (ROUTINE X 2)     Status: None   Collection Time    09/28/13  5:38 PM      Result Value Ref Range Status   Specimen Description BLOOD LEFT HAND    Final   Special Requests BOTTLES DRAWN AEROBIC AND ANAEROBIC 5ML   Final   Culture  Setup Time     Final   Value: 09/28/2013 22:28     Performed at [facility]   Culture     Final   Value:        BLOOD CULTURE RECEIVED NO GROWTH TO DATE CULTURE WILL BE HELD FOR 5 DAYS BEFORE ISSUING A FINAL NEGATIVE REPORT     Performed at [facility]   Report Status PENDING   Incomplete  MRSA PCR SCREENING     Status: None   Collection Time    09/28/13  6:31 PM      Result Value Ref Range Status   MRSA by PCR NEGATIVE  NEGATIVE Final   Comment:            The GeneXpert MRSA Assay (FDA     approved for NASAL specimens     only), is one component of a     comprehensive MRSA colonization     surveillance program. It is not     intended to diagnose MRSA     infection nor to guide or     monitor treatment for     MRSA infections.     Labs: Basic Metabolic Panel:  Recent Labs Lab 09/28/13 1509 09/28/13 1910 09/29/13 0316  NA 139  --  140  K 4.2  --  4.0  CL 100  --  102  CO2 25  --  26  GLUCOSE 198*  --  175*  BUN 18  --  13  CREATININE 0.69  --  0.80  CALCIUM 9.3  --  8.8  MG  --  2.1  --   PHOS  --  3.4  --    Liver Function Tests:  Recent Labs Lab 09/28/13 1509  AST 26  ALT 22  ALKPHOS 86  BILITOT 0.2*  PROT 7.4  ALBUMIN 4.1   No results found for this basename: LIPASE, AMYLASE,  in the last 168 hours No results found for this basename: AMMONIA,  in the last 168 hours CBC:  Recent Labs Lab 09/28/13 1509 09/29/13 0316  WBC 6.9 8.0  NEUTROABS 4.9  --   HGB 12.7 12.2  HCT 38.8 37.8  MCV 88.4 89.6  PLT 337 346   Cardiac Enzymes:  Recent Labs Lab 09/28/13 1509  TROPONINI <0.30    BNP: BNP (last 3 results) No results found for this basename: PROBNP,  in the last 8760 hours CBG:  Recent Labs Lab 09/29/13 1200 09/29/13 1658 09/29/13 2140 09/30/13 0723 09/30/13 1122  GLUCAP 187* 150* 137* 188* 173*       Signed:  Zalayah Pizzuto  [facility] 09/30/2013, 1:40 PM

## 2013-10-02 DIAGNOSIS — E119 Type 2 diabetes mellitus without complications: Secondary | ICD-10-CM | POA: Diagnosis not present

## 2013-10-02 DIAGNOSIS — Z8701 Personal history of pneumonia (recurrent): Secondary | ICD-10-CM | POA: Diagnosis not present

## 2013-10-02 DIAGNOSIS — I10 Essential (primary) hypertension: Secondary | ICD-10-CM | POA: Diagnosis not present

## 2013-10-02 DIAGNOSIS — IMO0001 Reserved for inherently not codable concepts without codable children: Secondary | ICD-10-CM | POA: Diagnosis not present

## 2013-10-04 LAB — CULTURE, BLOOD (ROUTINE X 2)
Culture: NO GROWTH
Culture: NO GROWTH

## 2013-10-05 DIAGNOSIS — IMO0001 Reserved for inherently not codable concepts without codable children: Secondary | ICD-10-CM | POA: Diagnosis not present

## 2013-10-05 DIAGNOSIS — I10 Essential (primary) hypertension: Secondary | ICD-10-CM | POA: Diagnosis not present

## 2013-10-05 DIAGNOSIS — E119 Type 2 diabetes mellitus without complications: Secondary | ICD-10-CM | POA: Diagnosis not present

## 2013-10-05 DIAGNOSIS — Z8701 Personal history of pneumonia (recurrent): Secondary | ICD-10-CM | POA: Diagnosis not present

## 2013-10-08 DIAGNOSIS — Z8701 Personal history of pneumonia (recurrent): Secondary | ICD-10-CM | POA: Diagnosis not present

## 2013-10-08 DIAGNOSIS — I10 Essential (primary) hypertension: Secondary | ICD-10-CM | POA: Diagnosis not present

## 2013-10-08 DIAGNOSIS — IMO0001 Reserved for inherently not codable concepts without codable children: Secondary | ICD-10-CM | POA: Diagnosis not present

## 2013-10-08 DIAGNOSIS — E119 Type 2 diabetes mellitus without complications: Secondary | ICD-10-CM | POA: Diagnosis not present

## 2013-10-10 ENCOUNTER — Telehealth: Payer: Self-pay | Admitting: Internal Medicine

## 2013-10-10 DIAGNOSIS — IMO0001 Reserved for inherently not codable concepts without codable children: Secondary | ICD-10-CM | POA: Diagnosis not present

## 2013-10-10 DIAGNOSIS — E119 Type 2 diabetes mellitus without complications: Secondary | ICD-10-CM | POA: Diagnosis not present

## 2013-10-10 DIAGNOSIS — Z8701 Personal history of pneumonia (recurrent): Secondary | ICD-10-CM | POA: Diagnosis not present

## 2013-10-10 DIAGNOSIS — I10 Essential (primary) hypertension: Secondary | ICD-10-CM | POA: Diagnosis not present

## 2013-10-10 NOTE — Telephone Encounter (Signed)
Pt needs a letter stating she needs to freeze her gym membership due to mobility issues. Please fax letter to the [employer] [place] [phone] attn Darah . [employer] [place] needs letter today to stop draft.

## 2013-10-11 ENCOUNTER — Encounter: Payer: Self-pay | Admitting: Internal Medicine

## 2013-10-11 NOTE — Telephone Encounter (Signed)
ok 

## 2013-10-12 DIAGNOSIS — IMO0001 Reserved for inherently not codable concepts without codable children: Secondary | ICD-10-CM | POA: Diagnosis not present

## 2013-10-12 DIAGNOSIS — Z8701 Personal history of pneumonia (recurrent): Secondary | ICD-10-CM | POA: Diagnosis not present

## 2013-10-12 DIAGNOSIS — I10 Essential (primary) hypertension: Secondary | ICD-10-CM | POA: Diagnosis not present

## 2013-10-12 DIAGNOSIS — E119 Type 2 diabetes mellitus without complications: Secondary | ICD-10-CM | POA: Diagnosis not present

## 2013-10-12 NOTE — Telephone Encounter (Signed)
Spoke to pt told her letter faxed to gym for her. Pt verbalized understanding. Letter faxed to [phone].

## 2013-10-16 DIAGNOSIS — E119 Type 2 diabetes mellitus without complications: Secondary | ICD-10-CM | POA: Diagnosis not present

## 2013-10-16 DIAGNOSIS — Z8701 Personal history of pneumonia (recurrent): Secondary | ICD-10-CM | POA: Diagnosis not present

## 2013-10-16 DIAGNOSIS — I10 Essential (primary) hypertension: Secondary | ICD-10-CM | POA: Diagnosis not present

## 2013-10-16 DIAGNOSIS — IMO0001 Reserved for inherently not codable concepts without codable children: Secondary | ICD-10-CM | POA: Diagnosis not present

## 2013-10-18 DIAGNOSIS — IMO0001 Reserved for inherently not codable concepts without codable children: Secondary | ICD-10-CM | POA: Diagnosis not present

## 2013-10-18 DIAGNOSIS — E119 Type 2 diabetes mellitus without complications: Secondary | ICD-10-CM | POA: Diagnosis not present

## 2013-10-18 DIAGNOSIS — I10 Essential (primary) hypertension: Secondary | ICD-10-CM | POA: Diagnosis not present

## 2013-10-18 DIAGNOSIS — Z8701 Personal history of pneumonia (recurrent): Secondary | ICD-10-CM | POA: Diagnosis not present

## 2013-10-22 DIAGNOSIS — I10 Essential (primary) hypertension: Secondary | ICD-10-CM | POA: Diagnosis not present

## 2013-10-22 DIAGNOSIS — Z8701 Personal history of pneumonia (recurrent): Secondary | ICD-10-CM | POA: Diagnosis not present

## 2013-10-22 DIAGNOSIS — E119 Type 2 diabetes mellitus without complications: Secondary | ICD-10-CM | POA: Diagnosis not present

## 2013-10-22 DIAGNOSIS — IMO0001 Reserved for inherently not codable concepts without codable children: Secondary | ICD-10-CM | POA: Diagnosis not present

## 2013-10-23 DIAGNOSIS — IMO0001 Reserved for inherently not codable concepts without codable children: Secondary | ICD-10-CM | POA: Diagnosis not present

## 2013-10-23 DIAGNOSIS — Z8701 Personal history of pneumonia (recurrent): Secondary | ICD-10-CM | POA: Diagnosis not present

## 2013-10-23 DIAGNOSIS — E119 Type 2 diabetes mellitus without complications: Secondary | ICD-10-CM | POA: Diagnosis not present

## 2013-10-23 DIAGNOSIS — I10 Essential (primary) hypertension: Secondary | ICD-10-CM | POA: Diagnosis not present

## 2013-10-24 DIAGNOSIS — Z8701 Personal history of pneumonia (recurrent): Secondary | ICD-10-CM | POA: Diagnosis not present

## 2013-10-24 DIAGNOSIS — I10 Essential (primary) hypertension: Secondary | ICD-10-CM | POA: Diagnosis not present

## 2013-10-24 DIAGNOSIS — E119 Type 2 diabetes mellitus without complications: Secondary | ICD-10-CM | POA: Diagnosis not present

## 2013-10-24 DIAGNOSIS — IMO0001 Reserved for inherently not codable concepts without codable children: Secondary | ICD-10-CM | POA: Diagnosis not present

## 2013-10-27 ENCOUNTER — Other Ambulatory Visit: Payer: Self-pay | Admitting: Internal Medicine

## 2013-10-30 ENCOUNTER — Ambulatory Visit (INDEPENDENT_AMBULATORY_CARE_PROVIDER_SITE_OTHER): Payer: Medicare Other | Admitting: Internal Medicine

## 2013-10-30 ENCOUNTER — Other Ambulatory Visit: Payer: Self-pay | Admitting: Internal Medicine

## 2013-10-30 ENCOUNTER — Encounter: Payer: Self-pay | Admitting: Internal Medicine

## 2013-10-30 ENCOUNTER — Telehealth: Payer: Self-pay | Admitting: Internal Medicine

## 2013-10-30 VITALS — BP 156/80 | HR 70 | Temp 97.8°F | Resp 20 | Ht 63.0 in | Wt 209.0 lb

## 2013-10-30 DIAGNOSIS — F329 Major depressive disorder, single episode, unspecified: Secondary | ICD-10-CM

## 2013-10-30 DIAGNOSIS — E119 Type 2 diabetes mellitus without complications: Secondary | ICD-10-CM | POA: Diagnosis not present

## 2013-10-30 DIAGNOSIS — F3289 Other specified depressive episodes: Secondary | ICD-10-CM | POA: Diagnosis not present

## 2013-10-30 DIAGNOSIS — M1990 Unspecified osteoarthritis, unspecified site: Secondary | ICD-10-CM

## 2013-10-30 DIAGNOSIS — I10 Essential (primary) hypertension: Secondary | ICD-10-CM

## 2013-10-30 DIAGNOSIS — F32A Depression, unspecified: Secondary | ICD-10-CM

## 2013-10-30 LAB — HEMOGLOBIN A1C: Hgb A1c MFr Bld: 8.1 % — ABNORMAL HIGH (ref 4.6–6.5)

## 2013-10-30 MED ORDER — ESCITALOPRAM OXALATE 10 MG PO TABS: 10.0000 mg | ORAL_TABLET | Freq: Every day | ORAL | Status: DC

## 2013-10-30 MED ORDER — PHENTERMINE HCL 37.5 MG PO CAPS: 37.5000 mg | ORAL_CAPSULE | ORAL | Status: DC

## 2013-10-30 NOTE — Progress Notes (Signed)
Subjective:    Patient ID: Leslie Benton, female    DOB: 1940/11/05, 73 y.o.   MRN: [id number]  HPI 73 year old patient who is seen today for followup.  She has type 2 diabetes.  No recent hemoglobin A1c.  No recent home blood sugar monitoring. She has been hospitalized recently for mental status changes.  Hospital records reviewed Her chief complaint continues to be left knee pain.  This has not improved following arthroscopic knee surgery.  She has mental [facility], where she received Synvisc injections. She complains of anxiety, low energy level, and general sense of unwellness. She does use BuSpar occasionally for anxiety.  Past Medical History  Diagnosis Date  . COLONIC POLYPS, HX OF 02/07/2008  . DIABETES MELLITUS, TYPE II 10/27/2009  . GERD 10/14/2006  . HYPERLIPIDEMIA 10/14/2006  . HYPERTENSION 10/14/2006  . HYPOTHYROIDISM 10/14/2006  . IMPAIRED GLUCOSE TOLERANCE 01/06/2009  . KNEE PAIN, RIGHT 07/15/2008  . LOW BACK PAIN 08/04/2007  . Obesity   . DEGENERATIVE JOINT DISEASE 10/14/2006    03/31/11: Pt states arthritis in her hip.    History   Social History  . Marital Status: Married    Spouse Name: N/A    Number of Children: N/A  . Years of Education: N/A   Occupational History  . Not on file.   Social History Main Topics  . Smoking status: Never Smoker   . Smokeless tobacco: Never Used  . Alcohol Use: No  . Drug Use: No  . Sexual Activity: Not on file   Other Topics Concern  . Not on file   Social History Narrative  . No narrative on file    Past Surgical History  Procedure Laterality Date  . Hemorrhoid surgery    . Tonsillectomy    . Left knee arthroscopic  April 2014    Dr. Durward Fortes    Family History  Problem Relation Age of Onset  . Heart disease Father     Allergies  Allergen Reactions  . Niacin And Related Hives and Swelling    No anaphalaxis, but strong reactions.  . Lactose Intolerance (Gi) Diarrhea  . Amoxicillin     REACTION:  unspecified  . Penicillins     Unknown     Current Outpatient Prescriptions on File Prior to Visit  Medication Sig Dispense Refill  . glimepiride (AMARYL) 4 MG tablet Take 2 mg by mouth daily with breakfast.      . levothyroxine (SYNTHROID, LEVOTHROID) 125 MCG tablet take 1 tablet by mouth once daily  90 tablet  3  . losartan-hydrochlorothiazide (HYZAAR) 100-25 MG per tablet take 1 tablet by mouth once daily  90 tablet  1  . metFORMIN (GLUCOPHAGE) 1000 MG tablet Take 1,000 mg by mouth 2 (two) times daily with a meal.      . omeprazole (PRILOSEC) 20 MG capsule take 1 capsule by mouth once daily  90 capsule  3   No current facility-administered medications on file prior to visit.    BP 156/80  Pulse 70  Temp(Src) 97.8 F (36.6 C) (Oral)  Resp 20  Ht 5\' 3"  (1.6 m)  Wt 209 lb (94.802 kg)  BMI 37.03 kg/m2  SpO2 97%    Review of Systems  Constitutional: Negative.   HENT: Negative for congestion, dental problem, hearing loss, rhinorrhea, sinus pressure, sore throat and tinnitus.   Eyes: Negative for pain, discharge and visual disturbance.  Respiratory: Negative for cough and shortness of breath.   Cardiovascular: Negative for chest pain,  palpitations and leg swelling.  Gastrointestinal: Negative for nausea, vomiting, abdominal pain, diarrhea, constipation, blood in stool and abdominal distention.  Genitourinary: Negative for dysuria, urgency, frequency, hematuria, flank pain, vaginal bleeding, vaginal discharge, difficulty urinating, vaginal pain and pelvic pain.  Musculoskeletal: Positive for arthralgias and gait problem. Negative for joint swelling.  Skin: Negative for rash.  Neurological: Negative for dizziness, syncope, speech difficulty, weakness, numbness and headaches.  Hematological: Negative for adenopathy.  Psychiatric/Behavioral: Negative for behavioral problems, dysphoric mood and agitation. The patient is nervous/anxious.        Objective:   Physical Exam    Constitutional: She is oriented to person, place, and time. She appears well-developed and well-nourished.  HENT:  Head: Normocephalic.  Right Ear: External ear normal.  Left Ear: External ear normal.  Mouth/Throat: Oropharynx is clear and moist.  Eyes: Conjunctivae and EOM are normal. Pupils are equal, round, and reactive to light.  Neck: Normal range of motion. Neck supple. No thyromegaly present.  Cardiovascular: Normal rate, regular rhythm, normal heart sounds and intact distal pulses.   Pulmonary/Chest: Effort normal and breath sounds normal.  Abdominal: Soft. Bowel sounds are normal. She exhibits no mass. There is no tenderness.  Musculoskeletal: Normal range of motion.  Tenderness left medial knee, but no inflammatory changes  Lymphadenopathy:    She has no cervical adenopathy.  Neurological: She is alert and oriented to person, place, and time.  Skin: Skin is warm and dry. No rash noted.  Psychiatric: She has a normal mood and affect. Her behavior is normal.          Assessment & Plan:  Chronic left knee pain diabetes mellitus.  Will check a hemoglobin A1c.  She has had an eye examination in January Hypertension stable Probable depression.  Will give a trial of Lexapro 10 mg.  Recheck 6 weeks

## 2013-10-30 NOTE — Patient Instructions (Signed)
Return in 6 weeks for follow-up  Limit your sodium (Salt) intake   Please check your hemoglobin A1c every 3 months  

## 2013-10-30 NOTE — Telephone Encounter (Signed)
Discussed with Dr. Raliegh Ip, okay to call in # 30 tablets Phentermine 37.5 mg. Rx called into the pharmacy.

## 2013-10-30 NOTE — Telephone Encounter (Signed)
Pt was seen today and needs refill on phentermine 37.5 mg #90 with refills sent to [facility] [address]

## 2013-10-30 NOTE — Progress Notes (Signed)
Pre visit review using our clinic review tool, if applicable. No additional management support is needed unless otherwise documented below in the visit note. 

## 2013-11-01 ENCOUNTER — Telehealth: Payer: Self-pay | Admitting: Internal Medicine

## 2013-11-01 DIAGNOSIS — I10 Essential (primary) hypertension: Secondary | ICD-10-CM | POA: Diagnosis not present

## 2013-11-01 DIAGNOSIS — Z8701 Personal history of pneumonia (recurrent): Secondary | ICD-10-CM | POA: Diagnosis not present

## 2013-11-01 DIAGNOSIS — E119 Type 2 diabetes mellitus without complications: Secondary | ICD-10-CM | POA: Diagnosis not present

## 2013-11-01 DIAGNOSIS — IMO0001 Reserved for inherently not codable concepts without codable children: Secondary | ICD-10-CM | POA: Diagnosis not present

## 2013-11-01 NOTE — Telephone Encounter (Signed)
Pt states that dr. Raliegh Ip always updates all of her meds , however on her last visit he forgot, pt states she only has two pills left of her rx levothyroxine (SYNTHROID, LEVOTHROID) 125 MCG tablet pt states that she is unable to tolerate the side effects from the rx  escitaslopram (lexapro) 10 mg. Pt requesting a call back to explain all of the effects.

## 2013-11-02 DIAGNOSIS — IMO0001 Reserved for inherently not codable concepts without codable children: Secondary | ICD-10-CM | POA: Diagnosis not present

## 2013-11-02 DIAGNOSIS — I10 Essential (primary) hypertension: Secondary | ICD-10-CM | POA: Diagnosis not present

## 2013-11-02 DIAGNOSIS — E119 Type 2 diabetes mellitus without complications: Secondary | ICD-10-CM | POA: Diagnosis not present

## 2013-11-02 DIAGNOSIS — Z8701 Personal history of pneumonia (recurrent): Secondary | ICD-10-CM | POA: Diagnosis not present

## 2013-11-02 NOTE — Telephone Encounter (Signed)
Spoke to pt, c/o side effects from Lexapro, dizziness, nausea, drowsy and has stopped, pt wants to go back on the Buspar 15 mg.Told pt okay will let Dr. Raliegh Ip know. Pt verbalized understanding.

## 2013-11-02 NOTE — Telephone Encounter (Signed)
Please see message and advise 

## 2013-11-02 NOTE — Telephone Encounter (Signed)
Okay to resume BuSpar

## 2013-11-02 NOTE — Telephone Encounter (Signed)
Left detailed message okay to take Buspar per Dr. Raliegh Ip.

## 2013-11-05 DIAGNOSIS — I10 Essential (primary) hypertension: Secondary | ICD-10-CM | POA: Diagnosis not present

## 2013-11-05 DIAGNOSIS — IMO0001 Reserved for inherently not codable concepts without codable children: Secondary | ICD-10-CM | POA: Diagnosis not present

## 2013-11-05 DIAGNOSIS — Z8701 Personal history of pneumonia (recurrent): Secondary | ICD-10-CM | POA: Diagnosis not present

## 2013-11-05 DIAGNOSIS — E119 Type 2 diabetes mellitus without complications: Secondary | ICD-10-CM | POA: Diagnosis not present

## 2013-11-05 NOTE — Telephone Encounter (Signed)
Spoke to pt received message on Friday to take Buspar.

## 2013-11-06 DIAGNOSIS — E119 Type 2 diabetes mellitus without complications: Secondary | ICD-10-CM | POA: Diagnosis not present

## 2013-11-06 DIAGNOSIS — Z8701 Personal history of pneumonia (recurrent): Secondary | ICD-10-CM | POA: Diagnosis not present

## 2013-11-06 DIAGNOSIS — I10 Essential (primary) hypertension: Secondary | ICD-10-CM | POA: Diagnosis not present

## 2013-11-06 DIAGNOSIS — IMO0001 Reserved for inherently not codable concepts without codable children: Secondary | ICD-10-CM | POA: Diagnosis not present

## 2013-11-08 DIAGNOSIS — M1710 Unilateral primary osteoarthritis, unspecified knee: Secondary | ICD-10-CM | POA: Diagnosis not present

## 2013-11-09 ENCOUNTER — Telehealth: Payer: Self-pay | Admitting: Internal Medicine

## 2013-11-09 DIAGNOSIS — E119 Type 2 diabetes mellitus without complications: Secondary | ICD-10-CM

## 2013-11-09 DIAGNOSIS — E785 Hyperlipidemia, unspecified: Secondary | ICD-10-CM

## 2013-11-09 DIAGNOSIS — E039 Hypothyroidism, unspecified: Secondary | ICD-10-CM

## 2013-11-09 DIAGNOSIS — I10 Essential (primary) hypertension: Secondary | ICD-10-CM

## 2013-11-09 NOTE — Telephone Encounter (Signed)
Please arrange evaluation and assistance with Leslie Benton

## 2013-11-09 NOTE — Telephone Encounter (Signed)
Caller: Cayden/Patient; Phone: [phone]; Reason for Call: Concerning call from patient.  Patient initially informed that she was speaking with Horris Latino and was placed on hold and was falling alseep waiting due to the music.  States that she does not want an appointment and does not want to call 911.  She wanted to let the doctor know "I am have side effects from metformin of Lactic acid build up and I am going to take a day off from all medications".  "I am going to have someone pick up some Bonine to help me with my dizziness".  Patient is describing symptoms as severe dizziness and doesnt know whether she should sit up or lay down and that she has a very dry mouth.  She did say she has urinated within the last 8 hours.  Patient is home alone at this time.  Strongly encouraged patient to dial 911 due to inability to stand due to severe dizziness and she states that she is only calling because the pharmacy told her to and that she does not want to be seen and she is going to go back to sleep.  Patient stated that she was getting another call and said good bye and hung up.

## 2013-11-12 NOTE — Telephone Encounter (Signed)
Referral order sent for [facility]

## 2013-11-13 DIAGNOSIS — Z8701 Personal history of pneumonia (recurrent): Secondary | ICD-10-CM | POA: Diagnosis not present

## 2013-11-13 DIAGNOSIS — IMO0001 Reserved for inherently not codable concepts without codable children: Secondary | ICD-10-CM | POA: Diagnosis not present

## 2013-11-13 DIAGNOSIS — E119 Type 2 diabetes mellitus without complications: Secondary | ICD-10-CM | POA: Diagnosis not present

## 2013-11-13 DIAGNOSIS — I10 Essential (primary) hypertension: Secondary | ICD-10-CM | POA: Diagnosis not present

## 2013-11-15 DIAGNOSIS — Z8701 Personal history of pneumonia (recurrent): Secondary | ICD-10-CM | POA: Diagnosis not present

## 2013-11-15 DIAGNOSIS — E119 Type 2 diabetes mellitus without complications: Secondary | ICD-10-CM | POA: Diagnosis not present

## 2013-11-15 DIAGNOSIS — I10 Essential (primary) hypertension: Secondary | ICD-10-CM | POA: Diagnosis not present

## 2013-11-15 DIAGNOSIS — IMO0001 Reserved for inherently not codable concepts without codable children: Secondary | ICD-10-CM | POA: Diagnosis not present

## 2013-11-21 ENCOUNTER — Other Ambulatory Visit: Payer: Self-pay | Admitting: Internal Medicine

## 2013-11-21 DIAGNOSIS — I10 Essential (primary) hypertension: Secondary | ICD-10-CM | POA: Diagnosis not present

## 2013-11-21 DIAGNOSIS — E119 Type 2 diabetes mellitus without complications: Secondary | ICD-10-CM | POA: Diagnosis not present

## 2013-11-21 DIAGNOSIS — IMO0001 Reserved for inherently not codable concepts without codable children: Secondary | ICD-10-CM | POA: Diagnosis not present

## 2013-11-21 DIAGNOSIS — Z8701 Personal history of pneumonia (recurrent): Secondary | ICD-10-CM | POA: Diagnosis not present

## 2013-11-21 NOTE — Telephone Encounter (Signed)
Okay to refill? 

## 2013-11-21 NOTE — Telephone Encounter (Signed)
Okay to refill Phentermine?

## 2013-11-23 DIAGNOSIS — Z8701 Personal history of pneumonia (recurrent): Secondary | ICD-10-CM | POA: Diagnosis not present

## 2013-11-23 DIAGNOSIS — E119 Type 2 diabetes mellitus without complications: Secondary | ICD-10-CM | POA: Diagnosis not present

## 2013-11-23 DIAGNOSIS — IMO0001 Reserved for inherently not codable concepts without codable children: Secondary | ICD-10-CM | POA: Diagnosis not present

## 2013-11-23 DIAGNOSIS — I10 Essential (primary) hypertension: Secondary | ICD-10-CM | POA: Diagnosis not present

## 2013-11-26 ENCOUNTER — Telehealth: Payer: Self-pay | Admitting: Internal Medicine

## 2013-11-26 NOTE — Telephone Encounter (Signed)
fyi Juana wallace rn from [facility] can not get in touch with pt and mailed her a outreach letter last week.

## 2013-11-27 DIAGNOSIS — Z8701 Personal history of pneumonia (recurrent): Secondary | ICD-10-CM | POA: Diagnosis not present

## 2013-11-27 DIAGNOSIS — IMO0001 Reserved for inherently not codable concepts without codable children: Secondary | ICD-10-CM | POA: Diagnosis not present

## 2013-11-27 DIAGNOSIS — E119 Type 2 diabetes mellitus without complications: Secondary | ICD-10-CM | POA: Diagnosis not present

## 2013-11-27 DIAGNOSIS — I10 Essential (primary) hypertension: Secondary | ICD-10-CM | POA: Diagnosis not present

## 2013-11-29 DIAGNOSIS — E119 Type 2 diabetes mellitus without complications: Secondary | ICD-10-CM | POA: Diagnosis not present

## 2013-11-29 DIAGNOSIS — I10 Essential (primary) hypertension: Secondary | ICD-10-CM | POA: Diagnosis not present

## 2013-11-29 DIAGNOSIS — IMO0001 Reserved for inherently not codable concepts without codable children: Secondary | ICD-10-CM | POA: Diagnosis not present

## 2013-11-29 DIAGNOSIS — Z8701 Personal history of pneumonia (recurrent): Secondary | ICD-10-CM | POA: Diagnosis not present

## 2013-12-06 ENCOUNTER — Telehealth: Payer: Self-pay | Admitting: Internal Medicine

## 2013-12-06 NOTE — Telephone Encounter (Signed)
Pt is requesting you to call her, states she has questions before her appointment on 12/10/13

## 2013-12-10 ENCOUNTER — Encounter: Payer: Self-pay | Admitting: Internal Medicine

## 2013-12-10 ENCOUNTER — Telehealth: Payer: Self-pay | Admitting: Internal Medicine

## 2013-12-10 ENCOUNTER — Ambulatory Visit (INDEPENDENT_AMBULATORY_CARE_PROVIDER_SITE_OTHER): Payer: Medicare Other | Admitting: Internal Medicine

## 2013-12-10 VITALS — BP 146/70 | HR 98 | Temp 98.1°F | Resp 20 | Ht 63.0 in | Wt 207.0 lb

## 2013-12-10 DIAGNOSIS — E119 Type 2 diabetes mellitus without complications: Secondary | ICD-10-CM

## 2013-12-10 DIAGNOSIS — I10 Essential (primary) hypertension: Secondary | ICD-10-CM

## 2013-12-10 MED ORDER — GLIMEPIRIDE 4 MG PO TABS: 2.0000 mg | ORAL_TABLET | Freq: Every day | ORAL | Status: DC

## 2013-12-10 MED ORDER — PIOGLITAZONE HCL 45 MG PO TABS: 45.0000 mg | ORAL_TABLET | Freq: Every day | ORAL | Status: DC

## 2013-12-10 NOTE — Progress Notes (Signed)
Pre visit review using our clinic review tool, if applicable. No additional management support is needed unless otherwise documented below in the visit note. 

## 2013-12-10 NOTE — Telephone Encounter (Signed)
Rx sent to [facility] 

## 2013-12-10 NOTE — Telephone Encounter (Signed)
Pt is needing new rx glimepiride (AMARYL) 4 MG tablet, sent to [facility].

## 2013-12-10 NOTE — Patient Instructions (Signed)
Limit your sodium (Salt) intake   Please check your hemoglobin A1c every 3 months  You need to lose weight.  Consider a lower calorie diet and regular exercise. 

## 2013-12-10 NOTE — Telephone Encounter (Signed)
Pt seen today, questions answered by Dr. Raliegh Ip.

## 2013-12-10 NOTE — Progress Notes (Signed)
Subjective:    Patient ID: Leslie Benton, female    DOB: [birth year], 73 y.o.   MRN: [id number]  HPI 73 year old patient who is seen today in followup.  Recent hemoglobin A1c was greater than 8.  However, she has been intolerant of metformin.  She has down titrated the dose to 500 mg once daily and still has some nausea issues.  She was placed on Lexapro 6 weeks ago for weakness, anxiety, general sense of unwellness, but has not been taking the medication.  She has treated hypertension, which has been stable.  She has been on glyburide.  Past Medical History  Diagnosis Date  . COLONIC POLYPS, HX OF 02/07/2008  . DIABETES MELLITUS, TYPE II 10/27/2009  . GERD 10/14/2006  . HYPERLIPIDEMIA 10/14/2006  . HYPERTENSION 10/14/2006  . HYPOTHYROIDISM 10/14/2006  . IMPAIRED GLUCOSE TOLERANCE 01/06/2009  . KNEE PAIN, RIGHT 07/15/2008  . LOW BACK PAIN 08/04/2007  . Obesity   . DEGENERATIVE JOINT DISEASE 10/14/2006    03/31/11: Pt states arthritis in her hip.    History   Social History  . Marital Status: Married    Spouse Name: N/A    Number of Children: N/A  . Years of Education: N/A   Occupational History  . Not on file.   Social History Main Topics  . Smoking status: Never Smoker   . Smokeless tobacco: Never Used  . Alcohol Use: No  . Drug Use: No  . Sexual Activity: Not on file   Other Topics Concern  . Not on file   Social History Narrative  . No narrative on file    Past Surgical History  Procedure Laterality Date  . Hemorrhoid surgery    . Tonsillectomy    . Left knee arthroscopic  April 2014    Dr. Durward Fortes    Family History  Problem Relation Age of Onset  . Heart disease Father     Allergies  Allergen Reactions  . Niacin And Related Hives and Swelling    No anaphalaxis, but strong reactions.  . Lactose Intolerance (Gi) Diarrhea  . Amoxicillin     REACTION: unspecified  . Penicillins     Unknown     Current Outpatient Prescriptions on File Prior to Visit    Medication Sig Dispense Refill  . escitalopram (LEXAPRO) 10 MG tablet Take 1 tablet (10 mg total) by mouth daily.  90 tablet  2  . levothyroxine (SYNTHROID, LEVOTHROID) 125 MCG tablet take 1 tablet by mouth once daily  90 tablet  3  . losartan-hydrochlorothiazide (HYZAAR) 100-25 MG per tablet take 1 tablet by mouth once daily  90 tablet  1  . metFORMIN (GLUCOPHAGE) 1000 MG tablet Take 1,000 mg by mouth 2 (two) times daily with a meal.      . omeprazole (PRILOSEC) 20 MG capsule take 1 capsule by mouth once daily  90 capsule  3  . phentermine 37.5 MG capsule take 1 capsule by mouth every morning  30 capsule  0  . simvastatin (ZOCOR) 40 MG tablet Take 40 mg by mouth daily at 6 PM.        No current facility-administered medications on file prior to visit.    BP 146/70  Pulse 98  Temp(Src) 98.1 F (36.7 C) (Oral)  Resp 20  Ht 5\' 3"  (1.6 m)  Wt 207 lb (93.895 kg)  BMI 36.68 kg/m2  SpO2 97%     Review of Systems  Constitutional: Negative.   HENT: Negative for congestion, dental  problem, hearing loss, rhinorrhea, sinus pressure, sore throat and tinnitus.   Eyes: Negative for pain, discharge and visual disturbance.  Respiratory: Negative for cough and shortness of breath.   Cardiovascular: Negative for chest pain, palpitations and leg swelling.  Gastrointestinal: Negative for nausea, vomiting, abdominal pain, diarrhea, constipation, blood in stool and abdominal distention.  Genitourinary: Negative for dysuria, urgency, frequency, hematuria, flank pain, vaginal bleeding, vaginal discharge, difficulty urinating, vaginal pain and pelvic pain.  Musculoskeletal: Negative for arthralgias, gait problem and joint swelling.       Chronic left knee pain  Skin: Negative for rash.  Neurological: Negative for dizziness, syncope, speech difficulty, weakness, numbness and headaches.  Hematological: Negative for adenopathy.  Psychiatric/Behavioral: Negative for behavioral problems, dysphoric mood and  agitation. The patient is not nervous/anxious.        Objective:   Physical Exam  Constitutional: She appears well-developed and well-nourished. No distress.  Blood pressure 130/70          Assessment & Plan:  Anxiety disorder.  Trial Lexapro Diabetes mellitus.  We'll discontinue metformin and place on Actos.  Continue Amaryl Hypertension stable  Recheck 2 months

## 2013-12-11 DIAGNOSIS — L820 Inflamed seborrheic keratosis: Secondary | ICD-10-CM | POA: Diagnosis not present

## 2013-12-11 DIAGNOSIS — L538 Other specified erythematous conditions: Secondary | ICD-10-CM | POA: Diagnosis not present

## 2013-12-31 ENCOUNTER — Other Ambulatory Visit: Payer: Self-pay | Admitting: Internal Medicine

## 2014-01-14 ENCOUNTER — Encounter: Payer: Self-pay | Admitting: Internal Medicine

## 2014-01-17 ENCOUNTER — Telehealth: Payer: Self-pay | Admitting: Internal Medicine

## 2014-01-17 NOTE — Telephone Encounter (Signed)
Pt saw ad in paper for  deep tissue laser treatment for her knee. Address  [address] phone [phone]. Pt would like dr Raliegh Ip opinion

## 2014-01-23 ENCOUNTER — Other Ambulatory Visit: Payer: Self-pay | Admitting: Internal Medicine

## 2014-02-06 ENCOUNTER — Other Ambulatory Visit: Payer: Self-pay | Admitting: Internal Medicine

## 2014-02-08 NOTE — Telephone Encounter (Signed)
Please advise if okay to refill. 

## 2014-02-08 NOTE — Telephone Encounter (Signed)
Rx called in to pharmacy. 

## 2014-02-08 NOTE — Telephone Encounter (Signed)
Ok #[phone] 

## 2014-02-11 ENCOUNTER — Ambulatory Visit (INDEPENDENT_AMBULATORY_CARE_PROVIDER_SITE_OTHER): Payer: Medicare Other | Admitting: Internal Medicine

## 2014-02-11 ENCOUNTER — Encounter: Payer: Self-pay | Admitting: Internal Medicine

## 2014-02-11 VITALS — BP 140/66 | HR 85 | Temp 97.4°F | Resp 20 | Ht 63.0 in | Wt 211.0 lb

## 2014-02-11 DIAGNOSIS — E119 Type 2 diabetes mellitus without complications: Secondary | ICD-10-CM

## 2014-02-11 DIAGNOSIS — I10 Essential (primary) hypertension: Secondary | ICD-10-CM | POA: Diagnosis not present

## 2014-02-11 DIAGNOSIS — Z23 Encounter for immunization: Secondary | ICD-10-CM | POA: Diagnosis not present

## 2014-02-11 LAB — HEMOGLOBIN A1C: HEMOGLOBIN A1C: 8.7 % — AB (ref 4.6–6.5)

## 2014-02-11 MED ORDER — CANAGLIFLOZIN 300 MG PO TABS: 300.0000 mg | ORAL_TABLET | Freq: Every day | ORAL | Status: DC

## 2014-02-11 MED ORDER — PHENTERMINE HCL 37.5 MG PO CAPS: ORAL_CAPSULE | ORAL | Status: DC

## 2014-02-11 NOTE — Progress Notes (Signed)
Pre visit review using our clinic review tool, if applicable. No additional management support is needed unless otherwise documented below in the visit note. 

## 2014-02-11 NOTE — Progress Notes (Signed)
Subjective:    Patient ID: Leslie Benton, female    DOB: [birth year], 73 y.o.   MRN: [id number]  HPI  73 year old patient who is seen today for follow-up of type 2 diabetes.  She has been intolerant of metformin in the past and more recently discontinued Actos after only one week of therapy.  Presently she is on Amaryl 4 mg daily.  Last hemoglobin A1c 8.1. She continues to have left knee pain, but is to see orthopedics in follow-up later next month She has hypertension which has been stable.  Wt Readings from Last 3 Encounters:  02/11/14 211 lb (95.709 kg)  12/10/13 207 lb (93.895 kg)  10/30/13 209 lb (94.802 kg)    Past Medical History  Diagnosis Date  . COLONIC POLYPS, HX OF 02/07/2008  . DIABETES MELLITUS, TYPE II 10/27/2009  . GERD 10/14/2006  . HYPERLIPIDEMIA 10/14/2006  . HYPERTENSION 10/14/2006  . HYPOTHYROIDISM 10/14/2006  . IMPAIRED GLUCOSE TOLERANCE 01/06/2009  . KNEE PAIN, RIGHT 07/15/2008  . LOW BACK PAIN 08/04/2007  . Obesity   . DEGENERATIVE JOINT DISEASE 10/14/2006    03/31/11: Pt states arthritis in her hip.    History   Social History  . Marital Status: Married    Spouse Name: N/A    Number of Children: N/A  . Years of Education: N/A   Occupational History  . Not on file.   Social History Main Topics  . Smoking status: Never Smoker   . Smokeless tobacco: Never Used  . Alcohol Use: No  . Drug Use: No  . Sexual Activity: Not on file   Other Topics Concern  . Not on file   Social History Narrative  . No narrative on file    Past Surgical History  Procedure Laterality Date  . Hemorrhoid surgery    . Tonsillectomy    . Left knee arthroscopic  April 2014    Dr. Durward Fortes    Family History  Problem Relation Age of Onset  . Heart disease Father     Allergies  Allergen Reactions  . Niacin And Related Hives and Swelling    No anaphalaxis, but strong reactions.  . Lactose Intolerance (Gi) Diarrhea  . Amoxicillin     REACTION: unspecified  .  Penicillins     Unknown     Current Outpatient Prescriptions on File Prior to Visit  Medication Sig Dispense Refill  . escitalopram (LEXAPRO) 10 MG tablet Take 1 tablet (10 mg total) by mouth daily.  90 tablet  2  . glimepiride (AMARYL) 4 MG tablet Take 0.5 tablets (2 mg total) by mouth daily with breakfast.  45 tablet  1  . levothyroxine (SYNTHROID, LEVOTHROID) 125 MCG tablet take 1 tablet by mouth once daily  90 tablet  3  . losartan-hydrochlorothiazide (HYZAAR) 100-25 MG per tablet take 1 tablet by mouth once daily  90 tablet  1  . omeprazole (PRILOSEC) 20 MG capsule take 1 capsule by mouth once daily  90 capsule  3  . phentermine 37.5 MG capsule take 1 capsule by mouth every morning for appetite control  30 capsule  1  . simvastatin (ZOCOR) 40 MG tablet TAKE 1 TABLET BY MOUTH EVERY EVENING FOR CHOLESTEROL  90 tablet  1  . pioglitazone (ACTOS) 45 MG tablet Take 1 tablet (45 mg total) by mouth daily.  90 tablet  4   No current facility-administered medications on file prior to visit.    BP 140/66  Pulse 85  Temp(Src) 97.4 F (  36.3 C) (Oral)  Resp 20  Ht 5\' 3"  (1.6 m)  Wt 211 lb (95.709 kg)  BMI 37.39 kg/m2  SpO2 97%     Review of Systems  Constitutional: Negative.   HENT: Negative for congestion, dental problem, hearing loss, rhinorrhea, sinus pressure, sore throat and tinnitus.   Eyes: Negative for pain, discharge and visual disturbance.  Respiratory: Negative for cough and shortness of breath.   Cardiovascular: Negative for chest pain, palpitations and leg swelling.  Gastrointestinal: Negative for nausea, vomiting, abdominal pain, diarrhea, constipation, blood in stool and abdominal distention.  Genitourinary: Negative for dysuria, urgency, frequency, hematuria, flank pain, vaginal bleeding, vaginal discharge, difficulty urinating, vaginal pain and pelvic pain.  Musculoskeletal: Negative for arthralgias (Left knee pain), gait problem and joint swelling.  Skin: Negative for  rash.  Neurological: Negative for dizziness, syncope, speech difficulty, weakness, numbness and headaches.  Hematological: Negative for adenopathy.  Psychiatric/Behavioral: Negative for behavioral problems, dysphoric mood and agitation. The patient is not nervous/anxious.        Objective:   Physical Exam  Constitutional: She appears well-developed and well-nourished. No distress.  Musculoskeletal: Normal range of motion. She exhibits no edema and no tenderness.          Assessment & Plan:   Hypertension, well-controlled Diabetes.  Will give a trial of invokanna 100 mg daily to titrate to 300 mg daily.  Lifestyle issues addressed Chronic left knee pain.  Follow-up orthopedics  Recheck 3 months

## 2014-02-11 NOTE — Patient Instructions (Signed)
It is important that you exercise regularly, at least 20 minutes 3 to 4 times per week.  If you develop chest pain or shortness of breath seek  medical attention.   Please check your hemoglobin A1c every 3 months  You need to lose weight.  Consider a lower calorie diet and regular exercise.

## 2014-02-12 ENCOUNTER — Telehealth: Payer: Self-pay | Admitting: Internal Medicine

## 2014-02-12 NOTE — Telephone Encounter (Signed)
emmi mailed  °

## 2014-02-13 ENCOUNTER — Telehealth: Payer: Self-pay | Admitting: Internal Medicine

## 2014-02-13 MED ORDER — PHENTERMINE HCL 37.5 MG PO CAPS: ORAL_CAPSULE | ORAL | Status: DC

## 2014-02-13 NOTE — Telephone Encounter (Signed)
Rx called in to pharmacy. 

## 2014-02-13 NOTE — Telephone Encounter (Signed)
Pt called to make sure that the following phentermine 37.5 MG capsule has been called in as 90 day supply     [facility]  [address]

## 2014-02-21 DIAGNOSIS — M1712 Unilateral primary osteoarthritis, left knee: Secondary | ICD-10-CM | POA: Diagnosis not present

## 2014-02-22 ENCOUNTER — Other Ambulatory Visit: Payer: Self-pay | Admitting: Orthopedic Surgery

## 2014-02-22 DIAGNOSIS — M25562 Pain in left knee: Secondary | ICD-10-CM

## 2014-03-04 ENCOUNTER — Other Ambulatory Visit: Payer: Medicare Other

## 2014-03-07 ENCOUNTER — Ambulatory Visit
Admission: RE | Admit: 2014-03-07 | Discharge: 2014-03-07 | Disposition: A | Discharge status: Home / Self Care | Payer: Medicare Other | Source: Ambulatory Visit | Attending: Orthopedic Surgery | Admitting: Orthopedic Surgery

## 2014-03-07 DIAGNOSIS — M25562 Pain in left knee: Secondary | ICD-10-CM

## 2014-03-07 DIAGNOSIS — M1712 Unilateral primary osteoarthritis, left knee: Secondary | ICD-10-CM | POA: Diagnosis not present

## 2014-03-07 DIAGNOSIS — M25462 Effusion, left knee: Secondary | ICD-10-CM | POA: Diagnosis not present

## 2014-03-28 ENCOUNTER — Ambulatory Visit (INDEPENDENT_AMBULATORY_CARE_PROVIDER_SITE_OTHER): Payer: Medicare Other | Admitting: Ophthalmology

## 2014-03-28 DIAGNOSIS — H43813 Vitreous degeneration, bilateral: Secondary | ICD-10-CM | POA: Diagnosis not present

## 2014-03-28 DIAGNOSIS — E11329 Type 2 diabetes mellitus with mild nonproliferative diabetic retinopathy without macular edema: Secondary | ICD-10-CM | POA: Diagnosis not present

## 2014-03-28 DIAGNOSIS — E11319 Type 2 diabetes mellitus with unspecified diabetic retinopathy without macular edema: Secondary | ICD-10-CM

## 2014-03-28 DIAGNOSIS — E11339 Type 2 diabetes mellitus with moderate nonproliferative diabetic retinopathy without macular edema: Secondary | ICD-10-CM

## 2014-03-28 DIAGNOSIS — H40013 Open angle with borderline findings, low risk, bilateral: Secondary | ICD-10-CM

## 2014-03-28 DIAGNOSIS — I10 Essential (primary) hypertension: Secondary | ICD-10-CM

## 2014-03-28 DIAGNOSIS — H35033 Hypertensive retinopathy, bilateral: Secondary | ICD-10-CM

## 2014-05-02 DIAGNOSIS — E119 Type 2 diabetes mellitus without complications: Secondary | ICD-10-CM | POA: Diagnosis not present

## 2014-05-02 DIAGNOSIS — H40013 Open angle with borderline findings, low risk, bilateral: Secondary | ICD-10-CM | POA: Diagnosis not present

## 2014-05-02 DIAGNOSIS — H2513 Age-related nuclear cataract, bilateral: Secondary | ICD-10-CM | POA: Diagnosis not present

## 2014-05-02 LAB — HM DIABETES EYE EXAM

## 2014-05-14 ENCOUNTER — Ambulatory Visit: Payer: Medicare Other | Admitting: Internal Medicine

## 2014-05-21 ENCOUNTER — Ambulatory Visit (INDEPENDENT_AMBULATORY_CARE_PROVIDER_SITE_OTHER): Payer: Medicare Other | Admitting: Internal Medicine

## 2014-05-21 ENCOUNTER — Encounter: Payer: Self-pay | Admitting: Internal Medicine

## 2014-05-21 VITALS — BP 150/80 | HR 89 | Temp 97.7°F | Resp 20 | Ht 63.0 in | Wt 212.0 lb

## 2014-05-21 DIAGNOSIS — I10 Essential (primary) hypertension: Secondary | ICD-10-CM | POA: Diagnosis not present

## 2014-05-21 DIAGNOSIS — E119 Type 2 diabetes mellitus without complications: Secondary | ICD-10-CM

## 2014-05-21 DIAGNOSIS — F329 Major depressive disorder, single episode, unspecified: Secondary | ICD-10-CM | POA: Diagnosis not present

## 2014-05-21 DIAGNOSIS — F32A Depression, unspecified: Secondary | ICD-10-CM

## 2014-05-21 LAB — HEMOGLOBIN A1C: HEMOGLOBIN A1C: 8.8 % — AB (ref 4.6–6.5)

## 2014-05-21 MED ORDER — BUSPIRONE HCL 15 MG PO TABS: 15.0000 mg | ORAL_TABLET | Freq: Three times a day (TID) | ORAL | Status: DC

## 2014-05-21 MED ORDER — PROPRANOLOL HCL 20 MG PO TABS: 20.0000 mg | ORAL_TABLET | Freq: Two times a day (BID) | ORAL | Status: DC

## 2014-05-21 MED ORDER — MELOXICAM 7.5 MG PO TABS: 7.5000 mg | ORAL_TABLET | Freq: Every day | ORAL | Status: DC

## 2014-05-21 NOTE — Patient Instructions (Signed)
Limit your sodium (Salt) intake   Please check your hemoglobin A1c every 3 months   

## 2014-05-21 NOTE — Progress Notes (Signed)
Pre visit review using our clinic review tool, if applicable. No additional management support is needed unless otherwise documented below in the visit note. 

## 2014-05-21 NOTE — Progress Notes (Signed)
Subjective:    Patient ID: Leslie Benton, female    DOB: July 22, 1940, 74 y.o.   MRN: [id number]  HPI  74 year old patient who is seen today in follow-up.  She has type 2 diabetes.  She has self discontinued the metformin, Actos and invokanna.  She is given information concerning possible ADE and she basically is fearful of any medications due to perceived side effects.  At the present time.  She is on glimepiride only for diabetic control.  She has discontinued losartan. She states that she is on propranolol 20 mg twice daily as needed.  She takes this mainly for anxiety.  She also is on BuSpar, which is no longer on her problem list  Past Medical History  Diagnosis Date  . COLONIC POLYPS, HX OF 02/07/2008  . DIABETES MELLITUS, TYPE II 10/27/2009  . GERD 10/14/2006  . HYPERLIPIDEMIA 10/14/2006  . HYPERTENSION 10/14/2006  . HYPOTHYROIDISM 10/14/2006  . IMPAIRED GLUCOSE TOLERANCE 01/06/2009  . KNEE PAIN, RIGHT 07/15/2008  . LOW BACK PAIN 08/04/2007  . Obesity   . DEGENERATIVE JOINT DISEASE 10/14/2006    03/31/11: Pt states arthritis in her hip.    History   Social History  . Marital Status: Married    Spouse Name: N/A    Number of Children: N/A  . Years of Education: N/A   Occupational History  . Not on file.   Social History Main Topics  . Smoking status: Never Smoker   . Smokeless tobacco: Never Used  . Alcohol Use: No  . Drug Use: No  . Sexual Activity: Not on file   Other Topics Concern  . Not on file   Social History Narrative    Past Surgical History  Procedure Laterality Date  . Hemorrhoid surgery    . Tonsillectomy    . Left knee arthroscopic  April 2014    Dr. Durward Fortes    Family History  Problem Relation Age of Onset  . Heart disease Father     Allergies  Allergen Reactions  . Niacin And Related Hives and Swelling    No anaphalaxis, but strong reactions.  . Lactose Intolerance (Gi) Diarrhea  . Amoxicillin     REACTION: unspecified  . Penicillins       Unknown     Current Outpatient Prescriptions on File Prior to Visit  Medication Sig Dispense Refill  . canagliflozin (INVOKANA) 300 MG TABS tablet Take 300 mg by mouth daily before breakfast. 30 tablet 6  . escitalopram (LEXAPRO) 10 MG tablet Take 1 tablet (10 mg total) by mouth daily. 90 tablet 2  . glimepiride (AMARYL) 4 MG tablet Take 0.5 tablets (2 mg total) by mouth daily with breakfast. 45 tablet 1  . levothyroxine (SYNTHROID, LEVOTHROID) 125 MCG tablet take 1 tablet by mouth once daily 90 tablet 3  . omeprazole (PRILOSEC) 20 MG capsule take 1 capsule by mouth once daily 90 capsule 3  . phentermine 37.5 MG capsule take 1 capsule by mouth every morning for appetite control 90 capsule 1  . losartan-hydrochlorothiazide (HYZAAR) 100-25 MG per tablet take 1 tablet by mouth once daily (Patient not taking: Reported on 05/21/2014) 90 tablet 1  . simvastatin (ZOCOR) 40 MG tablet TAKE 1 TABLET BY MOUTH EVERY EVENING FOR CHOLESTEROL (Patient not taking: Reported on 05/21/2014) 90 tablet 1   No current facility-administered medications on file prior to visit.    BP 150/80 mmHg  Pulse 89  Temp(Src) 97.7 F (36.5 C) (Oral)  Resp 20  Ht  5\' 3"  (1.6 m)  Wt 212 lb (96.163 kg)  BMI 37.56 kg/m2  SpO2 96%    Review of Systems  Constitutional: Negative.   HENT: Negative for congestion, dental problem, hearing loss, rhinorrhea, sinus pressure, sore throat and tinnitus.   Eyes: Negative for pain, discharge and visual disturbance.  Respiratory: Negative for cough and shortness of breath.   Cardiovascular: Negative for chest pain, palpitations and leg swelling.  Gastrointestinal: Negative for nausea, vomiting, abdominal pain, diarrhea, constipation, blood in stool and abdominal distention.  Genitourinary: Negative for dysuria, urgency, frequency, hematuria, flank pain, vaginal bleeding, vaginal discharge, difficulty urinating, vaginal pain and pelvic pain.  Musculoskeletal: Negative for joint  swelling, arthralgias and gait problem.  Skin: Negative for rash.  Neurological: Negative for dizziness, syncope, speech difficulty, weakness, numbness and headaches.  Hematological: Negative for adenopathy.  Psychiatric/Behavioral: Positive for behavioral problems and agitation. Negative for dysphoric mood. The patient is nervous/anxious.        Objective:   Physical Exam  Constitutional: She is oriented to person, place, and time. She appears well-developed and well-nourished.  Repeat blood pressure 120/80  HENT:  Head: Normocephalic.  Right Ear: External ear normal.  Left Ear: External ear normal.  Mouth/Throat: Oropharynx is clear and moist.  Eyes: Conjunctivae and EOM are normal. Pupils are equal, round, and reactive to light.  Neck: Normal range of motion. Neck supple. No thyromegaly present.  Cardiovascular: Normal rate, regular rhythm, normal heart sounds and intact distal pulses.   Pulmonary/Chest: Effort normal and breath sounds normal.  Abdominal: Soft. Bowel sounds are normal. She exhibits no mass. There is no tenderness.  Musculoskeletal: Normal range of motion.  Lymphadenopathy:    She has no cervical adenopathy.  Neurological: She is alert and oriented to person, place, and time.  Skin: Skin is warm and dry. No rash noted.  Psychiatric: She has a normal mood and affect. Her behavior is normal.          Assessment & Plan:   Diabetes mellitus.  Presently on glimepiride only.  Will check a hemoglobin A1c. Anxiety, depression.  Continue Lexapro and BuSpar Hypertension.  Presently well controlled Dyslipidemia.  Patient has discontinued simvastatin  Recheck 3 months

## 2014-05-24 ENCOUNTER — Encounter: Payer: Self-pay | Admitting: Internal Medicine

## 2014-05-24 ENCOUNTER — Other Ambulatory Visit: Payer: Self-pay | Admitting: *Deleted

## 2014-05-24 ENCOUNTER — Telehealth: Payer: Self-pay | Admitting: Internal Medicine

## 2014-05-24 MED ORDER — DAPAGLIFLOZIN PROPANEDIOL 10 MG PO TABS: 10.0000 mg | ORAL_TABLET | Freq: Every day | ORAL | Status: DC

## 2014-05-24 NOTE — Telephone Encounter (Signed)
Latrice from [employer] called to say that the followig med has been approved for 1 year as of 05/24/14 dapagliflozin propanediol (FARXIGA) 10 MG TABS tablet   phone number [phone]

## 2014-05-24 NOTE — Telephone Encounter (Signed)
Approval faxed to pharmacy

## 2014-05-29 ENCOUNTER — Telehealth: Payer: Self-pay | Admitting: Internal Medicine

## 2014-05-29 DIAGNOSIS — Z780 Asymptomatic menopausal state: Secondary | ICD-10-CM

## 2014-05-29 DIAGNOSIS — M1990 Unspecified osteoarthritis, unspecified site: Secondary | ICD-10-CM

## 2014-05-29 DIAGNOSIS — M8580 Other specified disorders of bone density and structure, unspecified site: Secondary | ICD-10-CM

## 2014-05-29 MED ORDER — GLUCOSE BLOOD VI STRP: ORAL_STRIP | Status: DC

## 2014-05-29 NOTE — Telephone Encounter (Signed)
Spoke to pt told her Rx for test strips was sent to pharmacy. Pt also said she was talking to her friend about her symptoms and she said about her having her Vit D checked. Told her I would have to check with Dr. Raliegh Ip and get back to her. Pt verbalized understanding and said she bought Vit D capsules to start taking.

## 2014-05-29 NOTE — Telephone Encounter (Signed)
Pt needs accu chek test strips call into [facility] [address]. Pt would like to know if she forget to test her bs before breakfast is that a problem.

## 2014-05-29 NOTE — Telephone Encounter (Signed)
ok 

## 2014-05-29 NOTE — Telephone Encounter (Signed)
Dr. Raliegh Ip, please see message and advise of okay to order Vit D level to be checked.

## 2014-05-30 ENCOUNTER — Other Ambulatory Visit: Payer: Self-pay | Admitting: Internal Medicine

## 2014-05-30 DIAGNOSIS — H40013 Open angle with borderline findings, low risk, bilateral: Secondary | ICD-10-CM | POA: Diagnosis not present

## 2014-05-30 NOTE — Telephone Encounter (Signed)
Left detailed message can have Vit D level checked order is in just need to call back and schedule lab appointment only.

## 2014-05-31 ENCOUNTER — Other Ambulatory Visit (INDEPENDENT_AMBULATORY_CARE_PROVIDER_SITE_OTHER): Payer: Medicare Other

## 2014-05-31 DIAGNOSIS — M8580 Other specified disorders of bone density and structure, unspecified site: Secondary | ICD-10-CM | POA: Diagnosis not present

## 2014-05-31 DIAGNOSIS — M1990 Unspecified osteoarthritis, unspecified site: Secondary | ICD-10-CM

## 2014-05-31 DIAGNOSIS — Z780 Asymptomatic menopausal state: Secondary | ICD-10-CM | POA: Diagnosis not present

## 2014-05-31 LAB — VITAMIN D 25 HYDROXY (VIT D DEFICIENCY, FRACTURES): VITD: 35.78 ng/mL (ref 30.00–100.00)

## 2014-06-06 ENCOUNTER — Ambulatory Visit: Payer: Self-pay | Admitting: Orthopedic Surgery

## 2014-06-06 NOTE — Progress Notes (Signed)
Preoperative surgical orders have been place into the [facility] hospital system for Leslie Benton on 06/06/2014, 1:03 PM  by Mickel Crow for surgery on 07-08-2014.  Preop UNI Knee orders including Experal, IV Tylenol, and IV Decadron as long as there are no contraindications to the above medications. Arlee Muslim, PA-C

## 2014-06-07 DIAGNOSIS — M1712 Unilateral primary osteoarthritis, left knee: Secondary | ICD-10-CM | POA: Diagnosis not present

## 2014-06-14 DIAGNOSIS — H40053 Ocular hypertension, bilateral: Secondary | ICD-10-CM | POA: Diagnosis not present

## 2014-06-26 ENCOUNTER — Telehealth: Payer: Self-pay | Admitting: Internal Medicine

## 2014-06-26 ENCOUNTER — Other Ambulatory Visit: Payer: Self-pay | Admitting: Internal Medicine

## 2014-06-26 NOTE — Telephone Encounter (Addendum)
Pharm called for pt who is insisting she needs to restart her metFORMIN (GLUCOPHAGE) 1000 MG tablet asap.  Pharm states last refilled 05/2013.    Pt told them she is having a procedure and needs this med.   [facility]

## 2014-06-27 MED ORDER — METFORMIN HCL 1000 MG PO TABS: 1000.0000 mg | ORAL_TABLET | Freq: Every day | ORAL | Status: DC

## 2014-06-27 NOTE — Telephone Encounter (Signed)
Please review chart and advise

## 2014-06-27 NOTE — Telephone Encounter (Signed)
Left detailed message Rx for Metformin sent to pharmacy. Dr.K said okay to resume.

## 2014-06-27 NOTE — Telephone Encounter (Signed)
Okay to resume metformin

## 2014-06-28 NOTE — Patient Instructions (Addendum)
Leslie Benton  06/28/2014   Your procedure is scheduled on: 07/08/2014    Report to [facility] Main  Entrance and follow signs to               [name] at      0600 AM.  Call this number if you have problems the morning of surgery [phone]   Remember: EAT a good healthy snak prior to bedtime.   Do not eat food or drink liquids :After Midnight.     Take these medicines the morning of surgery with A SIP OF WATER: Synthroid, Prilosec, Inderal  If needed                                You may not have any metal on your body including hair pins and              piercings  Do not wear jewelry, make-up, lotions, powders or perfumes., deodorant.               Do not wear nail polish.  Do not shave  48 hours prior to surgery.             Do not bring valuables to the hospital. [place].  Contacts, dentures or bridgework may not be worn into surgery.  Leave suitcase in the car. After surgery it may be brought to your room.         Special Instructions: coughing and deep breathing exercises, leg exercises               Please read over the following fact sheets you were given: _____________________________________________________________________             [facility] - Preparing for Surgery Before surgery, you can play an important role.  Because skin is not sterile, your skin needs to be as free of germs as possible.  You can reduce the number of germs on your skin by washing with CHG (chlorahexidine gluconate) soap before surgery.  CHG is an antiseptic cleaner which kills germs and bonds with the skin to continue killing germs even after washing. Please DO NOT use if you have an allergy to CHG or antibacterial soaps.  If your skin becomes reddened/irritated stop using the CHG and inform your nurse when you arrive at Short Stay. Do not shave (including legs and underarms) for at least 48 hours prior  to the first CHG shower.  You may shave your face/neck. Please follow these instructions carefully:  1.  Shower with CHG Soap the night before surgery and the  morning of Surgery.  2.  If you choose to wash your hair, wash your hair first as usual with your  normal  shampoo.  3.  After you shampoo, rinse your hair and body thoroughly to remove the  shampoo.                           4.  Use CHG as you would any other liquid soap.  You can apply chg directly  to the skin and wash  Gently with a scrungie or clean washcloth.  5.  Apply the CHG Soap to your body ONLY FROM THE NECK DOWN.   Do not use on face/ open                           Wound or open sores. Avoid contact with eyes, ears mouth and genitals (private parts).                       Wash face,  Genitals (private parts) with your normal soap.             6.  Wash thoroughly, paying special attention to the area where your surgery  will be performed.  7.  Thoroughly rinse your body with warm water from the neck down.  8.  DO NOT shower/wash with your normal soap after using and rinsing off  the CHG Soap.                9.  Pat yourself dry with a clean towel.            10.  Wear clean pajamas.            11.  Place clean sheets on your bed the night of your first shower and do not  sleep with pets. Day of Surgery : Do not apply any lotions/deodorants the morning of surgery.  Please wear clean clothes to the hospital/surgery center.  FAILURE TO FOLLOW THESE INSTRUCTIONS MAY RESULT IN THE CANCELLATION OF YOUR SURGERY PATIENT SIGNATURE_________________________________  NURSE SIGNATURE__________________________________  ________________________________________________________________________  WHAT IS A BLOOD TRANSFUSION? Blood Transfusion Information  A transfusion is the replacement of blood or some of its parts. Blood is made up of multiple cells which provide different functions.  Red blood cells carry oxygen  and are used for blood loss replacement.  White blood cells fight against infection.  Platelets control bleeding.  Plasma helps clot blood.  Other blood products are available for specialized needs, such as hemophilia or other clotting disorders. BEFORE THE TRANSFUSION  Who gives blood for transfusions?   Healthy volunteers who are fully evaluated to make sure their blood is safe. This is blood bank blood. Transfusion therapy is the safest it has ever been in the practice of medicine. Before blood is taken from a donor, a complete history is taken to make sure that person has no history of diseases nor engages in risky social behavior (examples are intravenous drug use or sexual activity with multiple partners). The donor's travel history is screened to minimize risk of transmitting infections, such as malaria. The donated blood is tested for signs of infectious diseases, such as HIV and hepatitis. The blood is then tested to be sure it is compatible with you in order to minimize the chance of a transfusion reaction. If you or a relative donates blood, this is often done in anticipation of surgery and is not appropriate for emergency situations. It takes many days to process the donated blood. RISKS AND COMPLICATIONS Although transfusion therapy is very safe and saves many lives, the main dangers of transfusion include:  1. Getting an infectious disease. 2. Developing a transfusion reaction. This is an allergic reaction to something in the blood you were given. Every precaution is taken to prevent this. The decision to have a blood transfusion has been considered carefully by your caregiver before blood is given. Blood is not given unless the benefits outweigh  the risks. AFTER THE TRANSFUSION  Right after receiving a blood transfusion, you will usually feel much better and more energetic. This is especially true if your red blood cells have gotten low (anemic). The transfusion raises the level  of the red blood cells which carry oxygen, and this usually causes an energy increase.  The nurse administering the transfusion will monitor you carefully for complications. HOME CARE INSTRUCTIONS  No special instructions are needed after a transfusion. You may find your energy is better. Speak with your caregiver about any limitations on activity for underlying diseases you may have. SEEK MEDICAL CARE IF:   Your condition is not improving after your transfusion.  You develop redness or irritation at the intravenous (IV) site. SEEK IMMEDIATE MEDICAL CARE IF:  Any of the following symptoms occur over the next 12 hours:  Shaking chills.  You have a temperature by mouth above 102 F (38.9 C), not controlled by medicine.  Chest, back, or muscle pain.  People around you feel you are not acting correctly or are confused.  Shortness of breath or difficulty breathing.  Dizziness and fainting.  You get a rash or develop hives.  You have a decrease in urine output.  Your urine turns a dark color or changes to pink, red, or brown. Any of the following symptoms occur over the next 10 days:  You have a temperature by mouth above 102 F (38.9 C), not controlled by medicine.  Shortness of breath.  Weakness after normal activity.  The white part of the eye turns yellow (jaundice).  You have a decrease in the amount of urine or are urinating less often.  Your urine turns a dark color or changes to pink, red, or brown. Document Released: 04/02/2000 Document Revised: 06/28/2011 Document Reviewed: 11/20/2007 ExitCare Patient Information 2014 [place].  _______________________________________________________________________  Incentive Spirometer  An incentive spirometer is a tool that can help keep your lungs clear and active. This tool measures how well you are filling your lungs with each breath. Taking long deep breaths may help reverse or decrease the chance of developing  breathing (pulmonary) problems (especially infection) following:  A long period of time when you are unable to move or be active. BEFORE THE PROCEDURE   If the spirometer includes an indicator to show your best effort, your nurse or respiratory therapist will set it to a desired goal.  If possible, sit up straight or lean slightly forward. Try not to slouch.  Hold the incentive spirometer in an upright position. INSTRUCTIONS FOR USE  3. Sit on the edge of your bed if possible, or sit up as far as you can in bed or on a chair. 4. Hold the incentive spirometer in an upright position. 5. Breathe out normally. 6. Place the mouthpiece in your mouth and seal your lips tightly around it. 7. Breathe in slowly and as deeply as possible, raising the piston or the ball toward the top of the column. 8. Hold your breath for 3-5 seconds or for as long as possible. Allow the piston or ball to fall to the bottom of the column. 9. Remove the mouthpiece from your mouth and breathe out normally. 10. Rest for a few seconds and repeat Steps 1 through 7 at least 10 times every 1-2 hours when you are awake. Take your time and take a few normal breaths between deep breaths. 11. The spirometer may include an indicator to show your best effort. Use the indicator as a goal to work  toward during each repetition. 12. After each set of 10 deep breaths, practice coughing to be sure your lungs are clear. If you have an incision (the cut made at the time of surgery), support your incision when coughing by placing a pillow or rolled up towels firmly against it. Once you are able to get out of bed, walk around indoors and cough well. You may stop using the incentive spirometer when instructed by your caregiver.  RISKS AND COMPLICATIONS  Take your time so you do not get dizzy or light-headed.  If you are in pain, you may need to take or ask for pain medication before doing incentive spirometry. It is harder to take a deep  breath if you are having pain. AFTER USE  Rest and breathe slowly and easily.  It can be helpful to keep track of a log of your progress. Your caregiver can provide you with a simple table to help with this. If you are using the spirometer at home, follow these instructions: [place] IF:   You are having difficultly using the spirometer.  You have trouble using the spirometer as often as instructed.  Your pain medication is not giving enough relief while using the spirometer.  You develop fever of 100.5 F (38.1 C) or higher. SEEK IMMEDIATE MEDICAL CARE IF:   You cough up bloody sputum that had not been present before.  You develop fever of 102 F (38.9 C) or greater.  You develop worsening pain at or near the incision site. MAKE SURE YOU:   Understand these instructions.  Will watch your condition.  Will get help right away if you are not doing well or get worse. Document Released: 08/16/2006 Document Revised: 06/28/2011 Document Reviewed: 10/17/2006 [facility] Patient Information 2014 [place], [place].   ________________________________________________________________________

## 2014-07-01 ENCOUNTER — Encounter (HOSPITAL_COMMUNITY): Payer: Self-pay

## 2014-07-01 ENCOUNTER — Encounter (HOSPITAL_COMMUNITY)
Admission: RE | Admit: 2014-07-01 | Discharge: 2014-07-01 | Disposition: A | Discharge status: Home / Self Care | Payer: Medicare Other | Source: Ambulatory Visit | Attending: Orthopedic Surgery | Admitting: Orthopedic Surgery

## 2014-07-01 DIAGNOSIS — Z794 Long term (current) use of insulin: Secondary | ICD-10-CM | POA: Diagnosis not present

## 2014-07-01 DIAGNOSIS — Z9851 Tubal ligation status: Secondary | ICD-10-CM | POA: Diagnosis not present

## 2014-07-01 DIAGNOSIS — F419 Anxiety disorder, unspecified: Secondary | ICD-10-CM | POA: Diagnosis not present

## 2014-07-01 DIAGNOSIS — Z8701 Personal history of pneumonia (recurrent): Secondary | ICD-10-CM | POA: Diagnosis not present

## 2014-07-01 DIAGNOSIS — F329 Major depressive disorder, single episode, unspecified: Secondary | ICD-10-CM | POA: Diagnosis not present

## 2014-07-01 DIAGNOSIS — Z9114 Patient's other noncompliance with medication regimen: Secondary | ICD-10-CM | POA: Diagnosis not present

## 2014-07-01 DIAGNOSIS — M1711 Unilateral primary osteoarthritis, right knee: Secondary | ICD-10-CM | POA: Diagnosis not present

## 2014-07-01 DIAGNOSIS — N179 Acute kidney failure, unspecified: Secondary | ICD-10-CM | POA: Diagnosis not present

## 2014-07-01 DIAGNOSIS — Z86010 Personal history of colonic polyps: Secondary | ICD-10-CM | POA: Diagnosis not present

## 2014-07-01 DIAGNOSIS — Z6837 Body mass index (BMI) 37.0-37.9, adult: Secondary | ICD-10-CM | POA: Diagnosis not present

## 2014-07-01 DIAGNOSIS — E669 Obesity, unspecified: Secondary | ICD-10-CM | POA: Diagnosis not present

## 2014-07-01 DIAGNOSIS — Z888 Allergy status to other drugs, medicaments and biological substances status: Secondary | ICD-10-CM | POA: Diagnosis not present

## 2014-07-01 DIAGNOSIS — Z880 Allergy status to penicillin: Secondary | ICD-10-CM | POA: Diagnosis not present

## 2014-07-01 DIAGNOSIS — I10 Essential (primary) hypertension: Secondary | ICD-10-CM | POA: Diagnosis not present

## 2014-07-01 DIAGNOSIS — E119 Type 2 diabetes mellitus without complications: Secondary | ICD-10-CM | POA: Diagnosis not present

## 2014-07-01 DIAGNOSIS — M1610 Unilateral primary osteoarthritis, unspecified hip: Secondary | ICD-10-CM | POA: Diagnosis not present

## 2014-07-01 DIAGNOSIS — Z881 Allergy status to other antibiotic agents status: Secondary | ICD-10-CM | POA: Diagnosis not present

## 2014-07-01 DIAGNOSIS — E785 Hyperlipidemia, unspecified: Secondary | ICD-10-CM | POA: Diagnosis not present

## 2014-07-01 DIAGNOSIS — R000 Tachycardia, unspecified: Secondary | ICD-10-CM | POA: Diagnosis not present

## 2014-07-01 DIAGNOSIS — E039 Hypothyroidism, unspecified: Secondary | ICD-10-CM | POA: Diagnosis not present

## 2014-07-01 DIAGNOSIS — K219 Gastro-esophageal reflux disease without esophagitis: Secondary | ICD-10-CM | POA: Diagnosis not present

## 2014-07-01 DIAGNOSIS — D649 Anemia, unspecified: Secondary | ICD-10-CM | POA: Diagnosis not present

## 2014-07-01 HISTORY — DX: Anxiety disorder, unspecified: F41.9

## 2014-07-01 HISTORY — DX: Major depressive disorder, single episode, unspecified: F32.9

## 2014-07-01 HISTORY — DX: Depression, unspecified: F32.A

## 2014-07-01 LAB — COMPREHENSIVE METABOLIC PANEL
ALT: 16 U/L (ref 0–35)
ANION GAP: 11 (ref 5–15)
AST: 17 U/L (ref 0–37)
Albumin: 3.8 g/dL (ref 3.5–5.2)
Alkaline Phosphatase: 99 U/L (ref 39–117)
BUN: 29 mg/dL — ABNORMAL HIGH (ref 6–23)
CALCIUM: 8.8 mg/dL (ref 8.4–10.5)
CO2: 23 mmol/L (ref 19–32)
CREATININE: 1.14 mg/dL — AB (ref 0.50–1.10)
Chloride: 107 mmol/L (ref 96–112)
GFR calc non Af Amer: 46 mL/min — ABNORMAL LOW (ref >90)
GFR, EST AFRICAN AMERICAN: 53 mL/min — AB (ref >90)
Glucose, Bld: 222 mg/dL — ABNORMAL HIGH (ref 70–99)
Potassium: 4 mmol/L (ref 3.5–5.1)
Sodium: 141 mmol/L (ref 135–145)
Total Bilirubin: 0.2 mg/dL — ABNORMAL LOW (ref 0.3–1.2)
Total Protein: 6.8 g/dL (ref 6.0–8.3)

## 2014-07-01 LAB — CBC
HCT: 29.5 % — ABNORMAL LOW (ref 36.0–46.0)
HEMOGLOBIN: 7.7 g/dL — AB (ref 12.0–15.0)
MCH: 16.9 pg — AB (ref 26.0–34.0)
MCHC: 26.1 g/dL — ABNORMAL LOW (ref 30.0–36.0)
MCV: 64.7 fL — ABNORMAL LOW (ref 78.0–100.0)
Platelets: 615 10*3/uL — ABNORMAL HIGH (ref 150–400)
RBC: 4.56 MIL/uL (ref 3.87–5.11)
RDW: 22.3 % — ABNORMAL HIGH (ref 11.5–15.5)
WBC: 6.7 10*3/uL (ref 4.0–10.5)

## 2014-07-01 LAB — URINALYSIS, ROUTINE W REFLEX MICROSCOPIC
Bilirubin Urine: NEGATIVE
GLUCOSE, UA: 100 mg/dL — AB
Hgb urine dipstick: NEGATIVE
Ketones, ur: NEGATIVE mg/dL
Leukocytes, UA: NEGATIVE
NITRITE: NEGATIVE
Protein, ur: NEGATIVE mg/dL
Specific Gravity, Urine: 1.034 — ABNORMAL HIGH (ref 1.005–1.030)
Urobilinogen, UA: 0.2 mg/dL (ref 0.0–1.0)
pH: 5 (ref 5.0–8.0)

## 2014-07-01 LAB — SURGICAL PCR SCREEN
MRSA, PCR: NEGATIVE
Staphylococcus aureus: POSITIVE — AB

## 2014-07-01 LAB — PROTIME-INR
INR: 1.07 (ref 0.00–1.49)
Prothrombin Time: 14 seconds (ref 11.6–15.2)

## 2014-07-01 LAB — APTT: aPTT: 30 seconds (ref 24–37)

## 2014-07-01 LAB — ABO/RH: ABO/RH(D): O POS

## 2014-07-01 NOTE — Progress Notes (Addendum)
Clearance Dr Lauris Chroman- 02/11/2014 on chart  EKG- 09/28/13 in EPIC CXR 09/29/13 in [facility]

## 2014-07-01 NOTE — Progress Notes (Signed)
Faxed CMP results done 07/01/14 to [phone] and called office of Dr Wynelle Link and spoke with Faith ( nurse tech)  at office and made her aware I had faxed CMP results to pod at [phone].  Faith stated she had texted to Dr Wynelle Link the hgb results of 7.7 done 07/01/14 but had not heard back from him yet.  Alos made her aware that patient's medication list at time of preop appointment today and list from Dr Inda Merlin office visit note fo 05/2014 do not match and I have contacted [place] to get a list of her current medications.

## 2014-07-01 NOTE — Progress Notes (Addendum)
Called office of Dr [name] and spoke with Kyra Searles ( nurse tech  For Dr Wynelle Link ) She states she will text Dr Wynelle Link and let him be aware of hgb 7.7 on 07/01/2014 ( today's ) lab results.  Also faxed CBC results to pod of Dr Wynelle Link and informed Faith of this at [phone].  Also faxed U/A results.

## 2014-07-01 NOTE — Progress Notes (Signed)
Per office visit note of Dr Inda Merlin on 05/21/2014 he reports on office visit note of medications that patient is not taking some of medications she has listed she is taking.  [place] and they are to fax me current medication list per them of what patient is currently taking.

## 2014-07-02 ENCOUNTER — Ambulatory Visit: Payer: BLUE CROSS/BLUE SHIELD | Admitting: Internal Medicine

## 2014-07-02 ENCOUNTER — Ambulatory Visit (INDEPENDENT_AMBULATORY_CARE_PROVIDER_SITE_OTHER): Payer: Medicare Other | Admitting: Internal Medicine

## 2014-07-02 ENCOUNTER — Observation Stay (HOSPITAL_COMMUNITY)
Admission: EM | Admit: 2014-07-02 | Discharge: 2014-07-04 | Disposition: A | Discharge status: Home / Self Care | Payer: Medicare Other | Attending: Internal Medicine | Admitting: Internal Medicine

## 2014-07-02 ENCOUNTER — Encounter (HOSPITAL_COMMUNITY): Payer: Self-pay

## 2014-07-02 ENCOUNTER — Telehealth: Payer: Self-pay | Admitting: Internal Medicine

## 2014-07-02 ENCOUNTER — Encounter: Payer: Self-pay | Admitting: Internal Medicine

## 2014-07-02 ENCOUNTER — Other Ambulatory Visit: Payer: Self-pay

## 2014-07-02 VITALS — BP 140/70 | HR 124 | Temp 98.5°F | Resp 20 | Ht 63.0 in | Wt 209.0 lb

## 2014-07-02 DIAGNOSIS — D649 Anemia, unspecified: Secondary | ICD-10-CM | POA: Diagnosis not present

## 2014-07-02 DIAGNOSIS — N179 Acute kidney failure, unspecified: Secondary | ICD-10-CM | POA: Insufficient documentation

## 2014-07-02 DIAGNOSIS — E785 Hyperlipidemia, unspecified: Secondary | ICD-10-CM | POA: Insufficient documentation

## 2014-07-02 DIAGNOSIS — Z86010 Personal history of colonic polyps: Secondary | ICD-10-CM | POA: Insufficient documentation

## 2014-07-02 DIAGNOSIS — E119 Type 2 diabetes mellitus without complications: Secondary | ICD-10-CM | POA: Diagnosis not present

## 2014-07-02 DIAGNOSIS — F419 Anxiety disorder, unspecified: Secondary | ICD-10-CM | POA: Insufficient documentation

## 2014-07-02 DIAGNOSIS — F329 Major depressive disorder, single episode, unspecified: Secondary | ICD-10-CM | POA: Insufficient documentation

## 2014-07-02 DIAGNOSIS — E669 Obesity, unspecified: Secondary | ICD-10-CM | POA: Insufficient documentation

## 2014-07-02 DIAGNOSIS — I10 Essential (primary) hypertension: Secondary | ICD-10-CM | POA: Insufficient documentation

## 2014-07-02 DIAGNOSIS — Z6837 Body mass index (BMI) 37.0-37.9, adult: Secondary | ICD-10-CM | POA: Insufficient documentation

## 2014-07-02 DIAGNOSIS — R7989 Other specified abnormal findings of blood chemistry: Secondary | ICD-10-CM

## 2014-07-02 DIAGNOSIS — E039 Hypothyroidism, unspecified: Secondary | ICD-10-CM | POA: Insufficient documentation

## 2014-07-02 DIAGNOSIS — R000 Tachycardia, unspecified: Secondary | ICD-10-CM | POA: Diagnosis not present

## 2014-07-02 DIAGNOSIS — M1711 Unilateral primary osteoarthritis, right knee: Secondary | ICD-10-CM | POA: Insufficient documentation

## 2014-07-02 DIAGNOSIS — Z8701 Personal history of pneumonia (recurrent): Secondary | ICD-10-CM | POA: Insufficient documentation

## 2014-07-02 DIAGNOSIS — M25561 Pain in right knee: Secondary | ICD-10-CM | POA: Diagnosis not present

## 2014-07-02 DIAGNOSIS — M1610 Unilateral primary osteoarthritis, unspecified hip: Secondary | ICD-10-CM | POA: Insufficient documentation

## 2014-07-02 DIAGNOSIS — Z794 Long term (current) use of insulin: Secondary | ICD-10-CM | POA: Insufficient documentation

## 2014-07-02 DIAGNOSIS — Z881 Allergy status to other antibiotic agents status: Secondary | ICD-10-CM | POA: Insufficient documentation

## 2014-07-02 DIAGNOSIS — Z880 Allergy status to penicillin: Secondary | ICD-10-CM | POA: Insufficient documentation

## 2014-07-02 DIAGNOSIS — K219 Gastro-esophageal reflux disease without esophagitis: Secondary | ICD-10-CM | POA: Insufficient documentation

## 2014-07-02 DIAGNOSIS — Z888 Allergy status to other drugs, medicaments and biological substances status: Secondary | ICD-10-CM | POA: Insufficient documentation

## 2014-07-02 DIAGNOSIS — R778 Other specified abnormalities of plasma proteins: Secondary | ICD-10-CM

## 2014-07-02 DIAGNOSIS — J189 Pneumonia, unspecified organism: Secondary | ICD-10-CM

## 2014-07-02 DIAGNOSIS — Z9851 Tubal ligation status: Secondary | ICD-10-CM | POA: Insufficient documentation

## 2014-07-02 DIAGNOSIS — F32A Depression, unspecified: Secondary | ICD-10-CM

## 2014-07-02 DIAGNOSIS — Z9114 Patient's other noncompliance with medication regimen: Secondary | ICD-10-CM | POA: Insufficient documentation

## 2014-07-02 LAB — COMPREHENSIVE METABOLIC PANEL
ALBUMIN: 4.1 g/dL (ref 3.5–5.2)
ALK PHOS: 93 U/L (ref 39–117)
ALT: 18 U/L (ref 0–35)
ANION GAP: 9 (ref 5–15)
AST: 22 U/L (ref 0–37)
BILIRUBIN TOTAL: 0.4 mg/dL (ref 0.3–1.2)
BUN: 26 mg/dL — AB (ref 6–23)
CO2: 24 mmol/L (ref 19–32)
CREATININE: 1 mg/dL (ref 0.50–1.10)
Calcium: 8.8 mg/dL (ref 8.4–10.5)
Chloride: 108 mmol/L (ref 96–112)
GFR calc non Af Amer: 54 mL/min — ABNORMAL LOW (ref >90)
GFR, EST AFRICAN AMERICAN: 62 mL/min — AB (ref >90)
GLUCOSE: 168 mg/dL — AB (ref 70–99)
POTASSIUM: 3.8 mmol/L (ref 3.5–5.1)
Sodium: 141 mmol/L (ref 135–145)
Total Protein: 7.2 g/dL (ref 6.0–8.3)

## 2014-07-02 LAB — CBC WITH DIFFERENTIAL/PLATELET
BASOS ABS: 0 10*3/uL (ref 0.0–0.1)
Basophils Relative: 0 % (ref 0–1)
EOS ABS: 0.1 10*3/uL (ref 0.0–0.7)
Eosinophils Relative: 1 % (ref 0–5)
HCT: 32.1 % — ABNORMAL LOW (ref 36.0–46.0)
Hemoglobin: 8.5 g/dL — ABNORMAL LOW (ref 12.0–15.0)
LYMPHS ABS: 2.8 10*3/uL (ref 0.7–4.0)
LYMPHS PCT: 38 % (ref 12–46)
MCH: 17 pg — ABNORMAL LOW (ref 26.0–34.0)
MCHC: 26.5 g/dL — AB (ref 30.0–36.0)
MCV: 64.3 fL — ABNORMAL LOW (ref 78.0–100.0)
Monocytes Absolute: 0.6 10*3/uL (ref 0.1–1.0)
Monocytes Relative: 8 % (ref 3–12)
Neutro Abs: 3.8 10*3/uL (ref 1.7–7.7)
Neutrophils Relative %: 53 % (ref 43–77)
PLATELETS: 601 10*3/uL — AB (ref 150–400)
RBC: 4.99 MIL/uL (ref 3.87–5.11)
RDW: 22.2 % — AB (ref 11.5–15.5)
WBC: 7.3 10*3/uL (ref 4.0–10.5)

## 2014-07-02 LAB — GLUCOSE, CAPILLARY: GLUCOSE-CAPILLARY: 146 mg/dL — AB (ref 70–99)

## 2014-07-02 LAB — PROTIME-INR
INR: 1.03 (ref 0.00–1.49)
Prothrombin Time: 13.6 seconds (ref 11.6–15.2)

## 2014-07-02 LAB — POC OCCULT BLOOD, ED: FECAL OCCULT BLD: NEGATIVE

## 2014-07-02 LAB — PREPARE RBC (CROSSMATCH)

## 2014-07-02 LAB — TROPONIN I: Troponin I: 0.15 ng/mL — ABNORMAL HIGH (ref <0.031)

## 2014-07-02 MED ORDER — PROPRANOLOL HCL 10 MG PO TABS
20.0000 mg | ORAL_TABLET | Freq: Two times a day (BID) | ORAL | Status: DC
Start: 2014-07-02 — End: 2014-07-04
  Administered 2014-07-03 – 2014-07-04 (×4): 20 mg via ORAL
  Filled 2014-07-02: qty 2
  Filled 2014-07-02: qty 1
  Filled 2014-07-02 (×2): qty 2

## 2014-07-02 MED ORDER — BUSPIRONE HCL 5 MG PO TABS
15.0000 mg | ORAL_TABLET | Freq: Three times a day (TID) | ORAL | Status: DC
  Administered 2014-07-03: 15 mg via ORAL
  Administered 2014-07-03 (×2): 5 mg via ORAL
  Filled 2014-07-02 (×4): qty 3

## 2014-07-02 MED ORDER — ACETAMINOPHEN 325 MG PO TABS
650.0000 mg | ORAL_TABLET | Freq: Three times a day (TID) | ORAL | Status: DC | PRN
  Administered 2014-07-03: 650 mg via ORAL
  Filled 2014-07-02: qty 2

## 2014-07-02 MED ORDER — SODIUM CHLORIDE 0.9 % IV SOLN
INTRAVENOUS | Status: DC
  Administered 2014-07-03: via INTRAVENOUS

## 2014-07-02 MED ORDER — NITROGLYCERIN 0.4 MG SL SUBL: 0.4000 mg | SUBLINGUAL_TABLET | SUBLINGUAL | Status: DC | PRN

## 2014-07-02 MED ORDER — SODIUM CHLORIDE 0.9 % IV BOLUS (SEPSIS)
1000.0000 mL | Freq: Once | INTRAVENOUS | Status: AC
  Administered 2014-07-02: 1000 mL via INTRAVENOUS

## 2014-07-02 MED ORDER — SODIUM CHLORIDE 0.9 % IJ SOLN
3.0000 mL | Freq: Two times a day (BID) | INTRAMUSCULAR | Status: DC
  Administered 2014-07-03 – 2014-07-04 (×4): 3 mL via INTRAVENOUS

## 2014-07-02 MED ORDER — LEVOTHYROXINE SODIUM 25 MCG PO TABS
125.0000 ug | ORAL_TABLET | Freq: Every day | ORAL | Status: DC
  Administered 2014-07-03 – 2014-07-04 (×2): 125 ug via ORAL
  Filled 2014-07-02 (×5): qty 1

## 2014-07-02 MED ORDER — INSULIN ASPART 100 UNIT/ML ~~LOC~~ SOLN
0.0000 [IU] | SUBCUTANEOUS | Status: DC
  Administered 2014-07-03: 1 [IU] via SUBCUTANEOUS
  Administered 2014-07-03: 2 [IU] via SUBCUTANEOUS
  Administered 2014-07-03: 1 [IU] via SUBCUTANEOUS

## 2014-07-02 MED ORDER — PANTOPRAZOLE SODIUM 40 MG PO TBEC
40.0000 mg | DELAYED_RELEASE_TABLET | Freq: Every day | ORAL | Status: DC
  Administered 2014-07-03 – 2014-07-04 (×3): 40 mg via ORAL
  Filled 2014-07-02 (×3): qty 1

## 2014-07-02 NOTE — Telephone Encounter (Signed)
Please call pt and schedule appointment today to see Dr.K.

## 2014-07-02 NOTE — ED Provider Notes (Signed)
CSN: [id number]     Arrival date & time 07/02/14  1616 History   First MD Initiated Contact with Patient 07/02/14 1813     Chief Complaint  Patient presents with  . Abnormal Lab     (Consider location/radiation/quality/duration/timing/severity/associated sxs/prior Treatment) HPI Comments: Patient from PCPs office with anemia and tachycardia. She was supposed to have surgery on her left knee later this month. She had routine blood work that showed a hemoglobin of 7.7. She denies any blood in her stools but they have been dark. Reportedly Hemoccult was negative at PCP. She denies any abdominal pain, chest pain, shortness of breath. She denies any vertigo but has had intermittent lightheadedness. Denies any blood thinner use.  The history is provided by the patient and a relative.    Past Medical History  Diagnosis Date  . COLONIC POLYPS, HX OF 02/07/2008  . DIABETES MELLITUS, TYPE II 10/27/2009  . GERD 10/14/2006  . HYPERLIPIDEMIA 10/14/2006  . HYPERTENSION 10/14/2006  . HYPOTHYROIDISM 10/14/2006  . IMPAIRED GLUCOSE TOLERANCE 01/06/2009  . KNEE PAIN, RIGHT 07/15/2008  . LOW BACK PAIN 08/04/2007  . Obesity   . DEGENERATIVE JOINT DISEASE 10/14/2006    03/31/11: Pt states arthritis in her hip.  . Anxiety   . Depression    Past Surgical History  Procedure Laterality Date  . Hemorrhoid surgery    . Tonsillectomy    . Left knee arthroscopic  April 2014    Dr. Durward Fortes  . Tubal ligation     Family History  Problem Relation Age of Onset  . Heart disease Father    History  Substance Use Topics  . Smoking status: Never Smoker   . Smokeless tobacco: Never Used  . Alcohol Use: No   OB History    No data available     Review of Systems  Constitutional: Positive for activity change, appetite change and fatigue. Negative for fever.  HENT: Negative for congestion and rhinorrhea.   Respiratory: Negative for cough, chest tightness and shortness of breath.   Cardiovascular: Negative for  chest pain.  Gastrointestinal: Positive for blood in stool. Negative for nausea, vomiting and abdominal pain.  Genitourinary: Negative for dysuria, hematuria, vaginal bleeding and vaginal discharge.  Musculoskeletal: Positive for myalgias and arthralgias.  Skin: Negative for rash.  Neurological: Negative for dizziness, weakness and headaches.  A complete 10 system review of systems was obtained and all systems are negative except as noted in the HPI and PMH.      Allergies  Niacin and related; Lactose intolerance (gi); Amoxicillin; and Penicillins  Home Medications   Prior to Admission medications   Medication Sig Start Date End Date Taking? Authorizing Provider  acetaminophen (TYLENOL) 500 MG tablet Take 1,000 mg by mouth every 6 (six) hours as needed for mild pain.   Yes Historical Provider, MD  acetaminophen (TYLENOL) 650 MG CR tablet Take 650 mg by mouth every 8 (eight) hours as needed for pain.   Yes Historical Provider, MD  busPIRone (BUSPAR) 15 MG tablet Take 1 tablet (15 mg total) by mouth 3 (three) times daily. Patient taking differently: Take 5-15 mg by mouth 3 (three) times daily as needed (anxiety).  05/21/14  Yes Marletta Lor, MD  glimepiride (AMARYL) 4 MG tablet Take 0.5 tablets (2 mg total) by mouth daily with breakfast. 12/10/13  Yes Marletta Lor, MD  glucose blood (ACCU-CHEK AVIVA) test strip USE TO CHECK BLOOD SUGAR DAILY AND PRN 05/29/14  Yes Marletta Lor, MD  levothyroxine (  SYNTHROID, LEVOTHROID) 125 MCG tablet take 1 tablet by mouth once daily   Yes Marletta Lor, MD  metFORMIN (GLUCOPHAGE) 1000 MG tablet Take 1 tablet (1,000 mg total) by mouth daily with breakfast. 06/27/14  Yes Marletta Lor, MD  omeprazole (PRILOSEC) 20 MG capsule take 1 capsule by mouth once daily 05/30/14  Yes Marletta Lor, MD  phentermine 37.5 MG capsule take 1 capsule by mouth every morning for appetite control 02/13/14  Yes Marletta Lor, MD  propranolol  (INDERAL) 20 MG tablet Take 1 tablet (20 mg total) by mouth 2 (two) times daily. Patient taking differently: Take 20 mg by mouth 2 (two) times daily. Patient takes as needed 05/21/14  Yes Marletta Lor, MD  losartan-hydrochlorothiazide [facility]) 100-25 MG per tablet take 1 tablet by mouth once daily Patient not taking: Reported on 07/02/2014 01/23/14   Marletta Lor, MD  meloxicam (MOBIC) 7.5 MG tablet Take 1 tablet (7.5 mg total) by mouth daily. Patient not taking: Reported on 07/02/2014 05/21/14   Marletta Lor, MD   BP 165/105 mmHg  Pulse 118  Temp(Src) 97.6 F (36.4 C) (Oral)  Resp 20  Ht 5\' 3"  (1.6 m)  Wt 211 lb 6.4 oz (95.89 kg)  BMI 37.46 kg/m2  SpO2 99% Physical Exam  Constitutional: She is oriented to person, place, and time. She appears well-developed and well-nourished. No distress.  HENT:  Head: Normocephalic and atraumatic.  Mouth/Throat: Oropharynx is clear and moist. No oropharyngeal exudate.  Eyes: Conjunctivae and EOM are normal. Pupils are equal, round, and reactive to light.  Neck: Normal range of motion. Neck supple.  No meningismus.  Cardiovascular: Normal rate, normal heart sounds and intact distal pulses.   No murmur heard. tachycardia  Pulmonary/Chest: Effort normal and breath sounds normal. No respiratory distress.  Abdominal: Soft. There is no tenderness. There is no rebound and no guarding.  Genitourinary: Guaiac negative stool.  No stool in rectal vault. No gross blood seen  Musculoskeletal: Normal range of motion. She exhibits no edema or tenderness.  Neurological: She is alert and oriented to person, place, and time. No cranial nerve deficit. She exhibits normal muscle tone. Coordination normal.  No ataxia on finger to nose bilaterally. No pronator drift. 5/5 strength throughout. CN 2-12 intact. Negative Romberg. Equal grip strength. Sensation intact. Gait is normal.   Skin: Skin is warm.  Psychiatric: She has a normal mood and affect. Her  behavior is normal.  Nursing note and vitals reviewed.   ED Course  Procedures (including critical care time) Labs Review Labs Reviewed  CBC WITH DIFFERENTIAL/PLATELET - Abnormal; Notable for the following:    Hemoglobin 8.5 (*)    HCT 32.1 (*)    MCV 64.3 (*)    MCH 17.0 (*)    MCHC 26.5 (*)    RDW 22.2 (*)    Platelets 601 (*)    All other components within normal limits  COMPREHENSIVE METABOLIC PANEL - Abnormal; Notable for the following:    Glucose, Bld 168 (*)    BUN 26 (*)    GFR calc non Af Amer 54 (*)    GFR calc Af Amer 62 (*)    All other components within normal limits  TROPONIN I - Abnormal; Notable for the following:    Troponin I 0.15 (*)    All other components within normal limits  GLUCOSE, CAPILLARY - Abnormal; Notable for the following:    Glucose-Capillary 146 (*)    All other components  within normal limits  PROTIME-INR  VITAMIN B12  FOLATE  IRON AND TIBC  FERRITIN  RETICULOCYTES  HEMOGLOBIN A1C  LIPID PANEL  COMPREHENSIVE METABOLIC PANEL  CBC WITH DIFFERENTIAL/PLATELET  TSH  TROPONIN I  TROPONIN I  TROPONIN I  BRAIN NATRIURETIC PEPTIDE  POC OCCULT BLOOD, ED  TYPE AND SCREEN  PREPARE RBC (CROSSMATCH)    Imaging Review No results found.   EKG Interpretation   Date/Time:  Tuesday July 02 2014 18:16:40 EDT Ventricular Rate:  122 PR Interval:  132 QRS Duration: 92 QT Interval:  373 QTC Calculation: 531 R Axis:   -67 Text Interpretation:  Sinus or ectopic atrial tachycardia Probable left  atrial enlargement LAD, consider left anterior fascicular block Anterior  infarct, old Prolonged QT interval Rate faster Confirmed by Wyvonnia Dusky  MD,  [place] [phone]) on 07/02/2014 6:26:40 PM      MDM   Final diagnoses:  Anemia, unspecified anemia type  Tachycardia   from PCPs office with anemia and tachycardia. Denies blood in the stool. No abdominal pain or chest pain.  Tachycardia may be from noncompliance with propanolol.  Hemoglobin 7  range from 12.  FOBT negative but poor sample. Suspect GI source from recent NSAID use. Troponin 0.15 likely demand ischemia.  pRBCs ordered Admission d/w DR. Newton. He requests cardiology consult.  Dr. Radford Pax states she is available if requested directly by hospitalist.  CRITICAL CARE Performed by: Ezequiel Essex Total critical care time: 35 Critical care time was exclusive of separately billable procedures and treating other patients. Critical care was necessary to treat or prevent imminent or life-threatening deterioration. Critical care was time spent personally by me on the following activities: development of treatment plan with patient and/or surrogate as well as nursing, discussions with consultants, evaluation of patient's response to treatment, examination of patient, obtaining history from patient or surrogate, ordering and performing treatments and interventions, ordering and review of laboratory studies, ordering and review of radiographic studies, pulse oximetry and re-evaluation of patient's condition.     Ezequiel Essex, MD 07/03/14 [phone]

## 2014-07-02 NOTE — Patient Instructions (Signed)
Report to the emergency room immediately for hospital admission

## 2014-07-02 NOTE — Telephone Encounter (Signed)
Pt is scheduled for 4 pm today

## 2014-07-02 NOTE — Progress Notes (Signed)
Patient called into Admitting with questions regarding her Mupirocin ointment.  I called patient back and reinstructed her regarding use of Mupirocin ointment.  Also patient stated she is having a problem today in picking up ointment from pharmacy.  I told her it would be okay if she picked up script tomorrow.  Reinforced going to MD today to be examined due to lab work results on 07/01/2014.

## 2014-07-02 NOTE — Progress Notes (Signed)
Pre visit review using our clinic review tool, if applicable. No additional management support is needed unless otherwise documented below in the visit note. 

## 2014-07-02 NOTE — ED Notes (Addendum)
Pt's daughter: Everardo Beals [phone]

## 2014-07-02 NOTE — Progress Notes (Signed)
Subjective:    Patient ID: Leslie Benton, female    DOB: Jul 06, 1940, 74 y.o.   MRN: [id number]  HPI 74 year old patient who was seen yesterday for preoperative lab draw and noted to have a new and significant microcytic anemia.  The patient was referred here for further evaluation. She denies any change in her bowel habits or abdominal pain.  No history of peptic ulcer disease. She has been prescribed meloxicam but has not taken this in a number of months.  She has been using both Advil and Aleve sporadically, however.  She generally feels well, although somewhat upset about the possibility of surgery being canceled She has a history colonic polyps and her last colonoscopy was 02/27/2010 and was normal.  No diverticular lesions were commented on  Past Medical History  Diagnosis Date  . COLONIC POLYPS, HX OF 02/07/2008  . DIABETES MELLITUS, TYPE II 10/27/2009  . GERD 10/14/2006  . HYPERLIPIDEMIA 10/14/2006  . HYPERTENSION 10/14/2006  . HYPOTHYROIDISM 10/14/2006  . IMPAIRED GLUCOSE TOLERANCE 01/06/2009  . KNEE PAIN, RIGHT 07/15/2008  . LOW BACK PAIN 08/04/2007  . Obesity   . DEGENERATIVE JOINT DISEASE 10/14/2006    03/31/11: Pt states arthritis in her hip.  . Anxiety   . Depression     History   Social History  . Marital Status: Married    Spouse Name: N/A  . Number of Children: N/A  . Years of Education: N/A   Occupational History  . Not on file.   Social History Main Topics  . Smoking status: Never Smoker   . Smokeless tobacco: Never Used  . Alcohol Use: No  . Drug Use: No  . Sexual Activity: Not on file   Other Topics Concern  . Not on file   Social History Narrative    Past Surgical History  Procedure Laterality Date  . Hemorrhoid surgery    . Tonsillectomy    . Left knee arthroscopic  April 2014    Dr. Durward Fortes  . Tubal ligation      Family History  Problem Relation Age of Onset  . Heart disease Father     Allergies  Allergen Reactions  . Niacin And  Related Hives and Swelling    No anaphalaxis, but strong reactions.  . Lactose Intolerance (Gi) Diarrhea  . Amoxicillin     REACTION: unspecified  . Penicillins     Unknown     Current Outpatient Prescriptions on File Prior to Visit  Medication Sig Dispense Refill  . acetaminophen (TYLENOL) 500 MG tablet Take 1,000 mg by mouth every 6 (six) hours as needed for mild pain.    Marland Kitchen acetaminophen (TYLENOL) 650 MG CR tablet Take 650 mg by mouth every 8 (eight) hours as needed for pain.    . busPIRone (BUSPAR) 15 MG tablet Take 1 tablet (15 mg total) by mouth 3 (three) times daily. 180 tablet 3  . glimepiride (AMARYL) 4 MG tablet Take 0.5 tablets (2 mg total) by mouth daily with breakfast. 45 tablet 1  . glucose blood (ACCU-CHEK AVIVA) test strip USE TO CHECK BLOOD SUGAR DAILY AND PRN 100 each 12  . levothyroxine (SYNTHROID, LEVOTHROID) 125 MCG tablet take 1 tablet by mouth once daily 90 tablet 3  . losartan-hydrochlorothiazide (HYZAAR) 100-25 MG per tablet take 1 tablet by mouth once daily 90 tablet 1  . meloxicam (MOBIC) 7.5 MG tablet Take 1 tablet (7.5 mg total) by mouth daily. 30 tablet 0  . metFORMIN (GLUCOPHAGE) 1000 MG tablet Take  1 tablet (1,000 mg total) by mouth daily with breakfast. 90 tablet 1  . omeprazole (PRILOSEC) 20 MG capsule take 1 capsule by mouth once daily 90 capsule 1  . phentermine 37.5 MG capsule take 1 capsule by mouth every morning for appetite control 90 capsule 1  . propranolol (INDERAL) 20 MG tablet Take 1 tablet (20 mg total) by mouth 2 (two) times daily. (Patient taking differently: Take 20 mg by mouth 2 (two) times daily. Patient takes as needed) 180 tablet 3   No current facility-administered medications on file prior to visit.    BP 140/70 mmHg  Pulse 124  Temp(Src) 98.5 F (36.9 C) (Oral)  Resp 20  Ht 5\' 3"  (1.6 m)  Wt 209 lb (94.802 kg)  BMI 37.03 kg/m2  SpO2 98%      Review of Systems  Constitutional: Negative.   HENT: Negative for congestion,  dental problem, hearing loss, rhinorrhea, sinus pressure, sore throat and tinnitus.   Eyes: Negative for pain, discharge and visual disturbance.  Respiratory: Negative for cough and shortness of breath.   Cardiovascular: Negative for chest pain, palpitations and leg swelling.  Gastrointestinal: Negative for nausea, vomiting, abdominal pain, diarrhea, constipation, blood in stool and abdominal distention.  Genitourinary: Negative for dysuria, urgency, frequency, hematuria, flank pain, vaginal bleeding, vaginal discharge, difficulty urinating, vaginal pain and pelvic pain.  Musculoskeletal: Positive for back pain, arthralgias and gait problem. Negative for joint swelling.       She has significant right knee pain and uses a wheelchair  Skin: Negative for rash.  Neurological: Negative for dizziness, syncope, speech difficulty, weakness, numbness and headaches.  Hematological: Negative for adenopathy.  Psychiatric/Behavioral: Negative for behavioral problems, dysphoric mood and agitation. The patient is not nervous/anxious.        Objective:   Physical Exam  Constitutional: She is oriented to person, place, and time. She appears well-developed and well-nourished.  Patient slightly pale No distress Blood pressure 150/80 supine  Blood pressure 140/80 standing  Resting pulse rate 130  HENT:  Head: Normocephalic.  Right Ear: External ear normal.  Left Ear: External ear normal.  Mouth/Throat: Oropharynx is clear and moist.  Eyes: Conjunctivae and EOM are normal. Pupils are equal, round, and reactive to light.  Neck: Normal range of motion. Neck supple. No thyromegaly present.  Cardiovascular: Normal rate, regular rhythm, normal heart sounds and intact distal pulses.   Pulmonary/Chest: Effort normal and breath sounds normal.  Abdominal: Soft. Bowel sounds are normal. She exhibits no distension and no mass. There is no tenderness. There is no rebound and no guarding.  Genitourinary: Guaiac  negative stool.  Scanty stool in the rectal vault, brown and hematest negative  Musculoskeletal: Normal range of motion.  Lymphadenopathy:    She has no cervical adenopathy.  Neurological: She is alert and oriented to person, place, and time.  Skin: Skin is warm and dry. No rash noted.  Psychiatric: She has a normal mood and affect. Her behavior is normal.          Assessment & Plan:   New onset microcytic anemia.  Suspect upper GI bleed, although stool, hematest negative.  Resting tachycardia bothersome.  Will need the at least overnight hospitalization for observation, serial H&H and consideration of upper endoscopy in the morning.  Will refer to the ED for stabilization.  [facility] (Dr Cristina Gong) will be notified Diabetes mellitus History of hypertension History of colonic polyps Osteoarthritis with right knee pain  Patient counseled to report to the emergency  room immediately.  Discussed with hospitalist who agrees the patient is too unstable for direct floor  admission

## 2014-07-02 NOTE — ED Notes (Signed)
Pt soon to xfer to floor

## 2014-07-02 NOTE — Progress Notes (Signed)
Pt arrived to [facility] from ED via stretcher; 1st of 2 units of blood infusing via PIV; oriented pt to rm, staff, fall prevention plan; callbell within reach- pt agreed to call for assist as needed; see flowsheets for full assessment... Blair Hailey, RN

## 2014-07-02 NOTE — Telephone Encounter (Signed)
Pt states she was instructed to fu w/ dr Raliegh Ip for another hemoglobin check. Pt is scheduled Monday 3/22 at 6am for knee surgery and was told her hemoglobin is low and see dr Raliegh Ip asap. But they also instructed pt to see dr Raliegh Ip asap but she does not know why. Pt states she had surgery clearance a while back, but this knee surgery has been put off several times.

## 2014-07-02 NOTE — H&P (Signed)
Hospitalist Admission History and Physical  Patient name: Leslie Benton Medical record number: [id number] Date of birth: 07-Nov-1940 Age: 74 y.o. Gender: female  Primary Care Provider: [name], MD  Chief Complaint: Anemia   History of Present Illness:This is a 74 y.o. year old female with significant past medical history of HTN, HLD, type 2 DM, hx/o colonic polyps  presenting with anemia. Pt was seen at PCPs office today for pre-surgical appt. Pt reports pending surgery for R knee OA. Pt was noted to have hgb 7 (baseline around 12) as well as HR into 120s. Pt was subsequently sent to ER for further evaluation. Pt denies any black/tarry stools. No nausea, vomiting. No decreased po intake. Does report high NSAID intake for knee pain. No prior hx/o anemia/GIB in the past. Intermittently takes medications at home.  Presented to the ER afebrile, HR 120s-130s, resp 10s, BP 140s-200s/70s-90s, satting 100% on RA. hgb 8.5, Cr 1.00. Hemoccult negative.  EKG w/ sinus tachycardia and a fasicular block. Noted trop 0.15. Pt denies any CP/SOB currently. Per EDP, PCP spoke w/ [facility] about case from office. EDP also pending call back from cardiology.    Assessment and Plan: Leslie Benton is a 74 y.o. year old female presenting with Anemia, elevated trop   Active Problems:   Anemia   1- Anemia -broad ddx including acute blood loss vs. Subacute on chronic issue -asymptomatic at this point  -noted microcytosis on labwork -anemia panel  -pending pRBC transfusion -PPI for GI coverage -hold NSAIDs   2- Elevated trop  -EKG w/ no significant ST/T wave changes -suspect demand ischemia in setting of above w/ concominant tachycardia  -cycle CEs -pending formal cards c/s -prn NTG -hold ASA given above-start if trop uptrends  -f/u cards recs  3- Tachycardia -sinus tach on EKG -likely secondary to above +/- beta blocker noncompliance -treat as above -restart propranolol-consider  transition to more selective BB as clinically indicated.  -tele bed -follow   4- HTN -Elevated on presentation  -baseline medication noncompliance  -noted recent high NSAID use -restart home regimen  -titrate as clinically indicated  5- DM -SSI  -A1C  -hold orals  FEN/GI: NPO for now Prophylaxis: SCDs Disposition: pending further evaluation  Code Status:Full Code    Patient Active Problem List   Diagnosis Date Noted  . Anemia 07/02/2014  . Hypothermia 09/28/2013  . Community acquired pneumonia 09/28/2013  . Altered mental status 09/28/2013  . CAP (community acquired pneumonia) 09/28/2013  . Depression 06/08/2012  . Diabetes mellitus without complication 81/04/7508  . KNEE PAIN, RIGHT 07/15/2008  . COLONIC POLYPS, HX OF 02/07/2008  . LOW BACK PAIN 08/04/2007  . HYPOTHYROIDISM 10/14/2006  . HYPERLIPIDEMIA 10/14/2006  . Essential hypertension 10/14/2006  . GERD 10/14/2006  . Osteoarthritis 10/14/2006   Past Medical History: Past Medical History  Diagnosis Date  . COLONIC POLYPS, HX OF 02/07/2008  . DIABETES MELLITUS, TYPE II 10/27/2009  . GERD 10/14/2006  . HYPERLIPIDEMIA 10/14/2006  . HYPERTENSION 10/14/2006  . HYPOTHYROIDISM 10/14/2006  . IMPAIRED GLUCOSE TOLERANCE 01/06/2009  . KNEE PAIN, RIGHT 07/15/2008  . LOW BACK PAIN 08/04/2007  . Obesity   . DEGENERATIVE JOINT DISEASE 10/14/2006    03/31/11: Pt states arthritis in her hip.  . Anxiety   . Depression     Past Surgical History: Past Surgical History  Procedure Laterality Date  . Hemorrhoid surgery    . Tonsillectomy    . Left knee arthroscopic  April 2014    Dr.  Whitfield  . Tubal ligation      Social History: History   Social History  . Marital Status: Married    Spouse Name: N/A  . Number of Children: N/A  . Years of Education: N/A   Social History Main Topics  . Smoking status: Never Smoker   . Smokeless tobacco: Never Used  . Alcohol Use: No  . Drug Use: No  . Sexual Activity: Not on  file   Other Topics Concern  . None   Social History Narrative    Family History: Family History  Problem Relation Age of Onset  . Heart disease Father     Allergies: Allergies  Allergen Reactions  . Niacin And Related Hives and Swelling    No anaphalaxis, but strong reactions.  . Lactose Intolerance (Gi) Diarrhea  . Amoxicillin     REACTION: unspecified  . Penicillins     Unknown     Current Facility-Administered Medications  Medication Dose Route Frequency Provider Last Rate Last Dose  . 0.9 %  sodium chloride infusion   Intravenous Continuous [name], MD      . acetaminophen (TYLENOL) tablet 650 mg  650 mg Oral Q8H PRN [name], MD      . busPIRone (BUSPAR) tablet 15 mg  15 mg Oral TID [name], MD      . insulin aspart (novoLOG) injection 0-9 Units  0-9 Units Subcutaneous 6 times per day [name], MD      . Derrill Memo ON 07/03/2014] levothyroxine (SYNTHROID, LEVOTHROID) tablet 125 mcg  125 mcg Oral QAC breakfast [name], MD      . nitroGLYCERIN (NITROSTAT) SL tablet 0.4 mg  0.4 mg Sublingual Q5 min PRN [name], MD      . pantoprazole (PROTONIX) EC tablet 40 mg  40 mg Oral Daily [name], MD      . propranolol (INDERAL) tablet 20 mg  20 mg Oral BID [name], MD      . sodium chloride 0.9 % bolus 1,000 mL  1,000 mL Intravenous Once Ezequiel Essex, MD 500 mL/hr at 07/02/14 1846 1,000 mL at 07/02/14 1846  . sodium chloride 0.9 % injection 3 mL  3 mL Intravenous Q12H [name], MD       Current Outpatient Prescriptions  Medication Sig Dispense Refill  . acetaminophen (TYLENOL) 500 MG tablet Take 1,000 mg by mouth every 6 (six) hours as needed for mild pain.    Marland Kitchen acetaminophen (TYLENOL) 650 MG CR tablet Take 650 mg by mouth every 8 (eight) hours as needed for pain.    . busPIRone (BUSPAR) 15 MG tablet Take 1 tablet (15 mg total) by mouth 3 (three) times daily. (Patient taking differently: Take 5-15 mg by mouth 3  (three) times daily as needed (anxiety). ) 180 tablet 3  . glimepiride (AMARYL) 4 MG tablet Take 0.5 tablets (2 mg total) by mouth daily with breakfast. 45 tablet 1  . glucose blood (ACCU-CHEK AVIVA) test strip USE TO CHECK BLOOD SUGAR DAILY AND PRN 100 each 12  . levothyroxine (SYNTHROID, LEVOTHROID) 125 MCG tablet take 1 tablet by mouth once daily 90 tablet 3  . metFORMIN (GLUCOPHAGE) 1000 MG tablet Take 1 tablet (1,000 mg total) by mouth daily with breakfast. 90 tablet 1  . omeprazole (PRILOSEC) 20 MG capsule take 1 capsule by mouth once daily 90 capsule 1  . phentermine 37.5 MG capsule take 1 capsule by mouth every morning  for appetite control 90 capsule 1  . propranolol (INDERAL) 20 MG tablet Take 1 tablet (20 mg total) by mouth 2 (two) times daily. (Patient taking differently: Take 20 mg by mouth 2 (two) times daily. Patient takes as needed) 180 tablet 3  . losartan-hydrochlorothiazide (HYZAAR) 100-25 MG per tablet take 1 tablet by mouth once daily (Patient not taking: Reported on 07/02/2014) 90 tablet 1  . meloxicam (MOBIC) 7.5 MG tablet Take 1 tablet (7.5 mg total) by mouth daily. (Patient not taking: Reported on 07/02/2014) 30 tablet 0   Review Of Systems: 12 point ROS negative except as noted above in HPI.  Physical Exam: Filed Vitals:   07/02/14 2030  BP:   Pulse:   Temp:   Resp: 20    General: alert and cooperative HEENT: PERRLA and extra ocular movement intact Heart: S1, S2 normal, no murmur, rub or gallop, regular rate and rhythm Lungs: clear to auscultation, no wheezes or rales and unlabored breathing Abdomen: abdomen is soft without significant tenderness, masses, organomegaly or guarding Extremities: extremities normal, atraumatic, no cyanosis or edema Skin:no rashes Neurology: normal without focal findings  Labs and Imaging: Lab Results  Component Value Date/Time   NA 141 07/02/2014 06:35 PM   K 3.8 07/02/2014 06:35 PM   CL 108 07/02/2014 06:35 PM   CO2 24  07/02/2014 06:35 PM   BUN 26* 07/02/2014 06:35 PM   CREATININE 1.00 07/02/2014 06:35 PM   GLUCOSE 168* 07/02/2014 06:35 PM   Lab Results  Component Value Date   WBC 7.3 07/02/2014   HGB 8.5* 07/02/2014   HCT 32.1* 07/02/2014   MCV 64.3* 07/02/2014   PLT 601* 07/02/2014    No results found.         Shanda Howells MD  Pager: [phone]

## 2014-07-02 NOTE — ED Notes (Addendum)
Pt presents stating "I don't know why I'm here, my doctor sent me.  He said something about me losing blood."  Pt c/o L leg pain, which she is getting ready to have surgery on.  Upon chart review, Pt had blood work drawn yesterday.  Lab work showed anemia and Hgb 7.7.  Denies rectal bleed and sts "the doctor checked my butt and it was fine."

## 2014-07-03 ENCOUNTER — Other Ambulatory Visit: Payer: Self-pay

## 2014-07-03 DIAGNOSIS — E118 Type 2 diabetes mellitus with unspecified complications: Secondary | ICD-10-CM | POA: Diagnosis not present

## 2014-07-03 DIAGNOSIS — R7989 Other specified abnormal findings of blood chemistry: Secondary | ICD-10-CM

## 2014-07-03 DIAGNOSIS — D649 Anemia, unspecified: Secondary | ICD-10-CM | POA: Diagnosis not present

## 2014-07-03 DIAGNOSIS — I10 Essential (primary) hypertension: Secondary | ICD-10-CM | POA: Diagnosis not present

## 2014-07-03 DIAGNOSIS — E669 Obesity, unspecified: Secondary | ICD-10-CM | POA: Diagnosis not present

## 2014-07-03 DIAGNOSIS — E039 Hypothyroidism, unspecified: Secondary | ICD-10-CM | POA: Diagnosis not present

## 2014-07-03 LAB — CBC WITH DIFFERENTIAL/PLATELET
BASOS PCT: 0 % (ref 0–1)
Basophils Absolute: 0 10*3/uL (ref 0.0–0.1)
EOS ABS: 0.1 10*3/uL (ref 0.0–0.7)
EOS PCT: 1 % (ref 0–5)
HCT: 34.1 % — ABNORMAL LOW (ref 36.0–46.0)
HEMOGLOBIN: 9.7 g/dL — AB (ref 12.0–15.0)
LYMPHS ABS: 1.5 10*3/uL (ref 0.7–4.0)
Lymphocytes Relative: 21 % (ref 12–46)
MCH: 19.2 pg — AB (ref 26.0–34.0)
MCHC: 28.4 g/dL — ABNORMAL LOW (ref 30.0–36.0)
MCV: 67.7 fL — ABNORMAL LOW (ref 78.0–100.0)
Monocytes Absolute: 0.3 10*3/uL (ref 0.1–1.0)
Monocytes Relative: 4 % (ref 3–12)
Neutro Abs: 5.3 10*3/uL (ref 1.7–7.7)
Neutrophils Relative %: 74 % (ref 43–77)
PLATELETS: 470 10*3/uL — AB (ref 150–400)
RBC: 5.04 MIL/uL (ref 3.87–5.11)
RDW: 24.2 % — ABNORMAL HIGH (ref 11.5–15.5)
WBC: 7.2 10*3/uL (ref 4.0–10.5)

## 2014-07-03 LAB — CBC
HEMATOCRIT: 33.9 % — AB (ref 36.0–46.0)
HEMOGLOBIN: 9.5 g/dL — AB (ref 12.0–15.0)
MCH: 19.1 pg — AB (ref 26.0–34.0)
MCHC: 28 g/dL — ABNORMAL LOW (ref 30.0–36.0)
MCV: 68.1 fL — AB (ref 78.0–100.0)
Platelets: 507 10*3/uL — ABNORMAL HIGH (ref 150–400)
RBC: 4.98 MIL/uL (ref 3.87–5.11)
RDW: 24.2 % — ABNORMAL HIGH (ref 11.5–15.5)
WBC: 6.5 10*3/uL (ref 4.0–10.5)

## 2014-07-03 LAB — TYPE AND SCREEN
ABO/RH(D): O POS
Antibody Screen: NEGATIVE
Unit division: 0
Unit division: 0

## 2014-07-03 LAB — BRAIN NATRIURETIC PEPTIDE: B Natriuretic Peptide: 65 pg/mL (ref 0.0–100.0)

## 2014-07-03 LAB — TROPONIN I
Troponin I: 0.14 ng/mL — ABNORMAL HIGH (ref <0.031)
Troponin I: 0.15 ng/mL — ABNORMAL HIGH (ref <0.031)
Troponin I: 0.17 ng/mL — ABNORMAL HIGH (ref <0.031)

## 2014-07-03 LAB — IRON AND TIBC
Iron: 75 ug/dL (ref 42–145)
Saturation Ratios: 17 % — ABNORMAL LOW (ref 20–55)
TIBC: 436 ug/dL (ref 250–470)
UIBC: 361 ug/dL (ref 125–400)

## 2014-07-03 LAB — FERRITIN: FERRITIN: 6 ng/mL — AB (ref 10–291)

## 2014-07-03 LAB — TSH: TSH: 11.61 u[IU]/mL — AB (ref 0.350–4.500)

## 2014-07-03 LAB — GLUCOSE, CAPILLARY
GLUCOSE-CAPILLARY: 167 mg/dL — AB (ref 70–99)
GLUCOSE-CAPILLARY: 154 mg/dL — AB (ref 70–99)
Glucose-Capillary: 150 mg/dL — ABNORMAL HIGH (ref 70–99)
Glucose-Capillary: 220 mg/dL — ABNORMAL HIGH (ref 70–99)
Glucose-Capillary: 160 mg/dL — ABNORMAL HIGH (ref 70–99)

## 2014-07-03 LAB — COMPREHENSIVE METABOLIC PANEL
ALK PHOS: 85 U/L (ref 39–117)
ALT: 16 U/L (ref 0–35)
AST: 20 U/L (ref 0–37)
Albumin: 3.8 g/dL (ref 3.5–5.2)
Anion gap: 8 (ref 5–15)
BUN: 18 mg/dL (ref 6–23)
CALCIUM: 8.4 mg/dL (ref 8.4–10.5)
CO2: 25 mmol/L (ref 19–32)
Chloride: 108 mmol/L (ref 96–112)
Creatinine, Ser: 0.85 mg/dL (ref 0.50–1.10)
GFR calc non Af Amer: 65 mL/min — ABNORMAL LOW (ref >90)
GFR, EST AFRICAN AMERICAN: 76 mL/min — AB (ref >90)
GLUCOSE: 162 mg/dL — AB (ref 70–99)
POTASSIUM: 4.2 mmol/L (ref 3.5–5.1)
SODIUM: 141 mmol/L (ref 135–145)
TOTAL PROTEIN: 6.6 g/dL (ref 6.0–8.3)
Total Bilirubin: 0.5 mg/dL (ref 0.3–1.2)

## 2014-07-03 LAB — LIPID PANEL
Cholesterol: 198 mg/dL (ref 0–200)
HDL: 56 mg/dL (ref >39)
LDL Cholesterol: 111 mg/dL — ABNORMAL HIGH (ref 0–99)
Total CHOL/HDL Ratio: 3.5 RATIO
Triglycerides: 157 mg/dL — ABNORMAL HIGH (ref <150)
VLDL: 31 mg/dL (ref 0–40)

## 2014-07-03 LAB — RETICULOCYTES
RBC.: 5.09 MIL/uL (ref 3.87–5.11)
Retic Count, Absolute: 56 10*3/uL (ref 19.0–186.0)
Retic Ct Pct: 1.1 % (ref 0.4–3.1)

## 2014-07-03 LAB — VITAMIN B12: Vitamin B-12: 647 pg/mL (ref 211–911)

## 2014-07-03 LAB — LACTATE DEHYDROGENASE: LDH: 169 U/L (ref 94–250)

## 2014-07-03 LAB — FOLATE

## 2014-07-03 LAB — OCCULT BLOOD X 1 CARD TO LAB, STOOL: Fecal Occult Bld: NEGATIVE

## 2014-07-03 MED ORDER — INSULIN ASPART 100 UNIT/ML ~~LOC~~ SOLN: 0.0000 [IU] | Freq: Every day | SUBCUTANEOUS | Status: DC

## 2014-07-03 MED ORDER — GLIMEPIRIDE 2 MG PO TABS
2.0000 mg | ORAL_TABLET | Freq: Every day | ORAL | Status: DC
  Administered 2014-07-03 – 2014-07-04 (×2): 2 mg via ORAL
  Filled 2014-07-03 (×3): qty 1

## 2014-07-03 MED ORDER — INSULIN ASPART 100 UNIT/ML ~~LOC~~ SOLN
0.0000 [IU] | Freq: Three times a day (TID) | SUBCUTANEOUS | Status: DC
  Administered 2014-07-03: 5 [IU] via SUBCUTANEOUS
  Administered 2014-07-03 – 2014-07-04 (×2): 3 [IU] via SUBCUTANEOUS
  Administered 2014-07-04: 5 [IU] via SUBCUTANEOUS

## 2014-07-03 NOTE — Progress Notes (Addendum)
PROGRESS NOTE  Leslie Benton OXB:[id number] DOB: [birth year] DOA: 07/02/2014 PCP: [name], MD  Assessment/Plan: Anemia -broad ddx including acute blood loss vs. Subacute on chronic issue- no back pain- no dark stools -asymptomatic at this point  -noted microcytosis on labwork -anemia panel (appears to be drawn after blood given); smear, LDH, haptoglobin -s/p 2 units of pRBC transfusion- Hgb up 2 units -PPI for GI coverage -hold NSAIDs  -GI consulted by PCP (Buccini)- spoke with Dr. Michail Sermon 3/16- no plan for EGD-- outpatient follow up -heme NEGATIVE per PCP   Elevated trop  -EKG w/ no significant ST/T wave changes -suspect demand ischemia in setting of above w/ concominant tachycardia  -cycle CEs- trending down -prn NTG -hold ASA given above-start if trop up-trends  -echo  Tachycardia -sinus tach on EKG- tele  NSR this AM -likely secondary to above +/- beta blocker noncompliance -treat as above -restart propranolol-consider transition to more selective BB as clinically indicated.  -tele bed -follow   HTN -Elevated on presentation  -baseline medication noncompliance  -noted recent high NSAID use -restart home regimen  -titrate as clinically indicated   DM -SSI  -A1C   AKI -IVF -resolved  Code Status: full Family Communication: patient Disposition Plan:    Consultants:    Procedures:      HPI/Subjective: Patient would like oatmeal for breakfast No CP, no SOB  Objective: Filed Vitals:   07/03/14 0421  BP: 159/65  Pulse: 74  Temp: 98.2 F (36.8 C)  Resp: 18    Intake/Output Summary (Last 24 hours) at 07/03/14 0759 Last data filed at 07/03/14 0700  Gross per 24 hour  Intake 997.17 ml  Output   2450 ml  Net -1452.83 ml   Filed Weights   07/02/14 2150  Weight: 95.89 kg (211 lb 6.4 oz)    Exam:   General:  A+Ox3, NAD  Cardiovascular: rrr  Respiratory: clear  Abdomen: +BS, soft  Musculoskeletal: min  edema   Data Reviewed: Basic Metabolic Panel:  Recent Labs Lab 07/01/14 1520 07/02/14 1835 07/03/14 0500  NA 141 141 141  K 4.0 3.8 4.2  CL 107 108 108  CO2 23 24 25   GLUCOSE 222* 168* 162*  BUN 29* 26* 18  CREATININE 1.14* 1.00 0.85  CALCIUM 8.8 8.8 8.4   Liver Function Tests:  Recent Labs Lab 07/01/14 1520 07/02/14 1835 07/03/14 0500  AST 17 22 20   ALT 16 18 16   ALKPHOS 99 93 85  BILITOT 0.2* 0.4 0.5  PROT 6.8 7.2 6.6  ALBUMIN 3.8 4.1 3.8   No results for input(s): LIPASE, AMYLASE in the last 168 hours. No results for input(s): AMMONIA in the last 168 hours. CBC:  Recent Labs Lab 07/01/14 1520 07/02/14 1835 07/03/14 0500  WBC 6.7 7.3 7.2  NEUTROABS  --  3.8 5.3  HGB 7.7* 8.5* 9.7*  HCT 29.5* 32.1* 34.1*  MCV 64.7* 64.3* 67.7*  PLT 615* 601* 470*   Cardiac Enzymes:  Recent Labs Lab 07/02/14 1835 07/03/14 0500  TROPONINI 0.15* 0.14*   BNP (last 3 results)  Recent Labs  07/03/14 0500  BNP 65.0    ProBNP (last 3 results) No results for input(s): PROBNP in the last 8760 hours.  CBG:  Recent Labs Lab 07/02/14 2309 07/03/14 0420  GLUCAP 146* 167*    Recent Results (from the past 240 hour(s))  Surgical pcr screen     Status: Abnormal   Collection Time: 07/01/14  3:07 PM  Result Value Ref Range Status  MRSA, PCR NEGATIVE NEGATIVE Final   Staphylococcus aureus POSITIVE (A) NEGATIVE Final    Comment:        The Xpert SA Assay (FDA approved for NASAL specimens in patients over 35 years of age), is one component of a comprehensive surveillance program.  Test performance has been validated by [facility] for patients greater than or equal to 9 year old. It is not intended to diagnose infection nor to guide or monitor treatment.      Studies: No results found.  Scheduled Meds: . busPIRone  15 mg Oral TID  . insulin aspart  0-9 Units Subcutaneous 6 times per day  . levothyroxine  125 mcg Oral QAC breakfast  . pantoprazole   40 mg Oral Daily  . propranolol  20 mg Oral BID  . sodium chloride  3 mL Intravenous Q12H   Continuous Infusions: . sodium chloride 50 mL/hr at 07/03/14 0200   Antibiotics Given (last 72 hours)    None      Active Problems:   Anemia   Elevated troponin    Time spent: 25 min    Bobbi Kozakiewicz, [facility] Pager [phone]. If 7PM-7AM, please contact night-coverage at [email or web address], password [facility] 07/03/2014, 7:59 AM

## 2014-07-03 NOTE — Progress Notes (Signed)
UR completed 

## 2014-07-03 NOTE — Progress Notes (Signed)
Report received from previous RN, pt denies pain but complains of nausea that is "slowly resolving" per patient.  Pt denies wanting further medication to assist with nausea. Will continue to monitor. Leslie Benton A

## 2014-07-04 ENCOUNTER — Telehealth: Payer: Self-pay | Admitting: *Deleted

## 2014-07-04 DIAGNOSIS — D649 Anemia, unspecified: Secondary | ICD-10-CM | POA: Diagnosis not present

## 2014-07-04 DIAGNOSIS — I25119 Atherosclerotic heart disease of native coronary artery with unspecified angina pectoris: Secondary | ICD-10-CM | POA: Diagnosis not present

## 2014-07-04 DIAGNOSIS — R7989 Other specified abnormal findings of blood chemistry: Secondary | ICD-10-CM | POA: Diagnosis not present

## 2014-07-04 DIAGNOSIS — R000 Tachycardia, unspecified: Secondary | ICD-10-CM | POA: Diagnosis not present

## 2014-07-04 LAB — CBC WITH DIFFERENTIAL/PLATELET
Basophils Absolute: 0 10*3/uL (ref 0.0–0.1)
Basophils Relative: 0 % (ref 0–1)
Eosinophils Absolute: 0.1 10*3/uL (ref 0.0–0.7)
Eosinophils Relative: 1 % (ref 0–5)
HEMATOCRIT: 33.9 % — AB (ref 36.0–46.0)
Hemoglobin: 9.7 g/dL — ABNORMAL LOW (ref 12.0–15.0)
LYMPHS ABS: 2.7 10*3/uL (ref 0.7–4.0)
Lymphocytes Relative: 37 % (ref 12–46)
MCH: 19.6 pg — AB (ref 26.0–34.0)
MCHC: 28.6 g/dL — AB (ref 30.0–36.0)
MCV: 68.3 fL — ABNORMAL LOW (ref 78.0–100.0)
MONO ABS: 0.7 10*3/uL (ref 0.1–1.0)
Monocytes Relative: 9 % (ref 3–12)
NEUTROS PCT: 53 % (ref 43–77)
Neutro Abs: 3.8 10*3/uL (ref 1.7–7.7)
Platelets: 458 10*3/uL — ABNORMAL HIGH (ref 150–400)
RBC: 4.96 MIL/uL (ref 3.87–5.11)
RDW: 24.2 % — ABNORMAL HIGH (ref 11.5–15.5)
WBC: 7.3 10*3/uL (ref 4.0–10.5)

## 2014-07-04 LAB — GLUCOSE, CAPILLARY
GLUCOSE-CAPILLARY: 152 mg/dL — AB (ref 70–99)
Glucose-Capillary: 229 mg/dL — ABNORMAL HIGH (ref 70–99)

## 2014-07-04 LAB — COMPREHENSIVE METABOLIC PANEL
ALBUMIN: 3.7 g/dL (ref 3.5–5.2)
ALT: 37 U/L — ABNORMAL HIGH (ref 0–35)
AST: 17 U/L (ref 0–37)
Alkaline Phosphatase: 83 U/L (ref 39–117)
Anion gap: 11 (ref 5–15)
BUN: 17 mg/dL (ref 6–23)
CO2: 24 mmol/L (ref 19–32)
CREATININE: 0.89 mg/dL (ref 0.50–1.10)
Calcium: 8.7 mg/dL (ref 8.4–10.5)
Chloride: 107 mmol/L (ref 96–112)
GFR calc Af Amer: 72 mL/min — ABNORMAL LOW (ref >90)
GFR, EST NON AFRICAN AMERICAN: 62 mL/min — AB (ref >90)
Glucose, Bld: 160 mg/dL — ABNORMAL HIGH (ref 70–99)
POTASSIUM: 4 mmol/L (ref 3.5–5.1)
Sodium: 142 mmol/L (ref 135–145)
Total Bilirubin: 0.5 mg/dL (ref 0.3–1.2)
Total Protein: 6.5 g/dL (ref 6.0–8.3)

## 2014-07-04 LAB — HEMOGLOBIN A1C
Hgb A1c MFr Bld: 7.6 % — ABNORMAL HIGH (ref 4.8–5.6)
Mean Plasma Glucose: 171 mg/dL

## 2014-07-04 LAB — HAPTOGLOBIN: Haptoglobin: 162 mg/dL (ref 34–200)

## 2014-07-04 MED ORDER — HYDRALAZINE HCL 20 MG/ML IJ SOLN: 10.0000 mg | Freq: Four times a day (QID) | INTRAMUSCULAR | Status: DC | PRN

## 2014-07-04 MED ORDER — LOSARTAN POTASSIUM 50 MG PO TABS: 50.0000 mg | ORAL_TABLET | Freq: Every day | ORAL | Status: DC

## 2014-07-04 MED ORDER — LOSARTAN POTASSIUM 50 MG PO TABS
50.0000 mg | ORAL_TABLET | Freq: Every day | ORAL | Status: DC
  Administered 2014-07-04: 50 mg via ORAL
  Filled 2014-07-04: qty 1

## 2014-07-04 NOTE — Progress Notes (Signed)
  Echocardiogram 2D Echocardiogram has been performed.  Darlina Sicilian M 07/04/2014, 1:17 PM

## 2014-07-04 NOTE — Telephone Encounter (Signed)
Patient called from hospital inquiring about hemoglobin levels and what she 'normally runs.' Patient explained she is supposed to have surgery but is on hold due to hemoglobin levels. Advised the patient her last hemoglobin check, prior to this month, was in June of last year and it was 12.2.

## 2014-07-04 NOTE — Discharge Summary (Signed)
Physician Discharge Summary  Leslie Benton NKN:[id number] DOB: September 30, 1940 DOA: 07/02/2014  PCP: [name], MD  Admit date: 07/02/2014 Discharge date: 07/04/2014  Time spent: 35 minutes  Recommendations for Outpatient Follow-up:  1. Outpatient referral to heme onc 2. Appears to be Fe def but labs ordered after transfusion- repeat at hematology appointment  Discharge Diagnoses:  Active Problems:   Anemia   Elevated troponin   Discharge Condition: improved  Diet recommendation: cardiac/diabetic  Filed Weights   07/02/14 2150  Weight: 95.89 kg (211 lb 6.4 oz)    History of present illness:  This is a 74 y.o. year old female with significant past medical history of HTN, HLD, type 2 DM, hx/o colonic polyps presenting with anemia. Pt was seen at PCPs office today for pre-surgical appt. Pt reports pending surgery for R knee OA. Pt was noted to have hgb 7 (baseline around 12) as well as HR into 120s. Pt was subsequently sent to ER for further evaluation. Pt denies any black/tarry stools. No nausea, vomiting. No decreased po intake. Does report high NSAID intake for knee pain. No prior hx/o anemia/GIB in the past. Intermittently takes medications at home.  Presented to the ER afebrile, HR 120s-130s, resp 10s, BP 140s-200s/70s-90s, satting 100% on RA. hgb 8.5, Cr 1.00. Hemoccult negative. EKG w/ sinus tachycardia and a fasicular block. Noted trop 0.15. Pt denies any CP/SOB currently. Per EDP, PCP spoke w/ [facility] about case from office. EDP also pending call back from cardiology  Hospital Course:  Anemia -broad ddx including acute blood loss vs. Subacute on chronic issue- no back pain- no dark stools -asymptomatic at this point  -noted microcytosis on labwork -anemia panel (appears to be drawn after blood given);  LDH, haptoglobin ok- suspect iron def -s/p 2 units of pRBC transfusion- Hgb up 2 units and stable -PPI for GI coverage -hold NSAIDs  -GI consulted by PCP  (Buccini)- spoke with Dr. Michail Sermon 3/16- no plan for EGD-- outpatient follow up -heme NEGATIVE   Elevated trop  -EKG w/ no significant ST/T wave changes -suspect demand ischemia in setting of above w/ concominant tachycardia  -cycle CEs- flat- discussed with on call cardiology and if echo ok, no further work up indicated -echo ok  Tachycardia -sinus tach on EKG- tele NSR this AM -likely secondary to above +/- beta blocker noncompliance -treat as above -patient drink several energy drinks per day per husband  HTN -Elevated on presentation  -baseline medication noncompliance  -noted recent high NSAID use -restart home cozaar- pharmacy reports that patient does not take at home -titrate as clinically indicated  DM -SSI  -A1C   AKI -IVF -resolved   Procedures:    Consultations:  Cards- phone  GI- phone  Discharge Exam: Filed Vitals:   07/04/14 1100  BP: 172/80  Pulse:   Temp:   Resp:     General: A+Ox3, NAD Cardiovascular: rrr Respiratory:clear  Discharge Instructions    Current Discharge Medication List    START taking these medications   Details  losartan (COZAAR) 50 MG tablet Take 1 tablet (50 mg total) by mouth daily. Qty: 30 tablet, Refills: 0      CONTINUE these medications which have NOT CHANGED   Details  acetaminophen (TYLENOL) 650 MG CR tablet Take 650 mg by mouth every 8 (eight) hours as needed for pain.    busPIRone (BUSPAR) 15 MG tablet Take 1 tablet (15 mg total) by mouth 3 (three) times daily. Qty: 180 tablet, Refills: 3  glimepiride (AMARYL) 4 MG tablet Take 0.5 tablets (2 mg total) by mouth daily with breakfast. Qty: 45 tablet, Refills: 1    glucose blood (ACCU-CHEK AVIVA) test strip USE TO CHECK BLOOD SUGAR DAILY AND PRN Qty: 100 each, Refills: 12    levothyroxine (SYNTHROID, LEVOTHROID) 125 MCG tablet take 1 tablet by mouth once daily Qty: 90 tablet, Refills: 3    metFORMIN (GLUCOPHAGE) 1000 MG tablet Take 1  tablet (1,000 mg total) by mouth daily with breakfast. Qty: 90 tablet, Refills: 1    omeprazole (PRILOSEC) 20 MG capsule take 1 capsule by mouth once daily Qty: 90 capsule, Refills: 1    propranolol (INDERAL) 20 MG tablet Take 1 tablet (20 mg total) by mouth 2 (two) times daily. Qty: 180 tablet, Refills: 3      STOP taking these medications     acetaminophen (TYLENOL) 500 MG tablet      phentermine 37.5 MG capsule      losartan-hydrochlorothiazide (HYZAAR) 100-25 MG per tablet      meloxicam (MOBIC) 7.5 MG tablet        Allergies  Allergen Reactions  . Niacin And Related Hives and Swelling    No anaphalaxis, but strong reactions.  . Lactose Intolerance (Gi) Diarrhea  . Amoxicillin     REACTION: unspecified  . Penicillins     Unknown       The results of significant diagnostics from this hospitalization (including imaging, microbiology, ancillary and laboratory) are listed below for reference.    Significant Diagnostic Studies: No results found.  Microbiology: Recent Results (from the past 240 hour(s))  Surgical pcr screen     Status: Abnormal   Collection Time: 07/01/14  3:07 PM  Result Value Ref Range Status   MRSA, PCR NEGATIVE NEGATIVE Final   Staphylococcus aureus POSITIVE (A) NEGATIVE Final    Comment:        The Xpert SA Assay (FDA approved for NASAL specimens in patients over 59 years of age), is one component of a comprehensive surveillance program.  Test performance has been validated by [facility] for patients greater than or equal to 66 year old. It is not intended to diagnose infection nor to guide or monitor treatment.      Labs: Basic Metabolic Panel:  Recent Labs Lab 07/01/14 1520 07/02/14 1835 07/03/14 0500 07/04/14 0525  NA 141 141 141 142  K 4.0 3.8 4.2 4.0  CL 107 108 108 107  CO2 23 24 25 24   GLUCOSE 222* 168* 162* 160*  BUN 29* 26* 18 17  CREATININE 1.14* 1.00 0.85 0.89  CALCIUM 8.8 8.8 8.4 8.7   Liver Function  Tests:  Recent Labs Lab 07/01/14 1520 07/02/14 1835 07/03/14 0500 07/04/14 0525  AST 17 22 20 17   ALT 16 18 16  37*  ALKPHOS 99 93 85 83  BILITOT 0.2* 0.4 0.5 0.5  PROT 6.8 7.2 6.6 6.5  ALBUMIN 3.8 4.1 3.8 3.7   No results for input(s): LIPASE, AMYLASE in the last 168 hours. No results for input(s): AMMONIA in the last 168 hours. CBC:  Recent Labs Lab 07/01/14 1520 07/02/14 1835 07/03/14 0500 07/03/14 1715 07/04/14 0525  WBC 6.7 7.3 7.2 6.5 7.3  NEUTROABS  --  3.8 5.3  --  3.8  HGB 7.7* 8.5* 9.7* 9.5* 9.7*  HCT 29.5* 32.1* 34.1* 33.9* 33.9*  MCV 64.7* 64.3* 67.7* 68.1* 68.3*  PLT 615* 601* 470* 507* 458*   Cardiac Enzymes:  Recent Labs Lab 07/02/14 1835 07/03/14 0500  07/03/14 1040 07/03/14 1707  TROPONINI 0.15* 0.14* 0.15* 0.17*   BNP: BNP (last 3 results)  Recent Labs  07/03/14 0500  BNP 65.0    ProBNP (last 3 results) No results for input(s): PROBNP in the last 8760 hours.  CBG:  Recent Labs Lab 07/03/14 1136 07/03/14 1648 07/03/14 2017 07/04/14 0729 07/04/14 1157  GLUCAP 220* 154* 160* 152* 229*       Signed:  VANN, JESSICA  [facility] 07/04/2014, 1:26 PM

## 2014-07-04 NOTE — Progress Notes (Signed)
UR completed 

## 2014-07-05 ENCOUNTER — Telehealth: Payer: Self-pay | Admitting: Internal Medicine

## 2014-07-05 DIAGNOSIS — D649 Anemia, unspecified: Secondary | ICD-10-CM

## 2014-07-05 NOTE — Telephone Encounter (Signed)
Dr.K, please advise what referral pt needs?

## 2014-07-05 NOTE — Telephone Encounter (Signed)
Patient would a referral to see a Hematologist for her left leg pain.  She said Dr. Raliegh Ip knows all about it already.

## 2014-07-08 ENCOUNTER — Inpatient Hospital Stay (HOSPITAL_COMMUNITY): Admission: RE | Admit: 2014-07-08 | Payer: Medicare Other | Source: Ambulatory Visit | Admitting: Orthopedic Surgery

## 2014-07-08 ENCOUNTER — Encounter (HOSPITAL_COMMUNITY): Admission: RE | Payer: Self-pay | Source: Ambulatory Visit

## 2014-07-08 DIAGNOSIS — D509 Iron deficiency anemia, unspecified: Secondary | ICD-10-CM | POA: Diagnosis not present

## 2014-07-08 LAB — TYPE AND SCREEN
ABO/RH(D): O POS
Antibody Screen: NEGATIVE

## 2014-07-08 SURGERY — ARTHROPLASTY, KNEE, UNICOMPARTMENTAL
Anesthesia: Choice | Site: Knee | Laterality: Left

## 2014-07-08 NOTE — Telephone Encounter (Signed)
Okay referral due to anemia

## 2014-07-08 NOTE — Telephone Encounter (Signed)
Pt is aware waiting on md °

## 2014-07-08 NOTE — Telephone Encounter (Signed)
Pt notified order for referral to Hematologist done and someone will contact you regarding an appointment. Pt verbalized understanding.

## 2014-07-10 ENCOUNTER — Telehealth: Payer: Self-pay | Admitting: Internal Medicine

## 2014-07-10 ENCOUNTER — Telehealth: Payer: Self-pay

## 2014-07-10 DIAGNOSIS — R195 Other fecal abnormalities: Secondary | ICD-10-CM | POA: Diagnosis not present

## 2014-07-10 DIAGNOSIS — D509 Iron deficiency anemia, unspecified: Secondary | ICD-10-CM | POA: Diagnosis not present

## 2014-07-10 DIAGNOSIS — Z86010 Personal history of colonic polyps: Secondary | ICD-10-CM | POA: Diagnosis not present

## 2014-07-10 NOTE — Telephone Encounter (Signed)
Pt called asking when her appt is. [place] gave her our phone number. There is a referral placed to [facility] for hematology d/t anemia. Pt is anxious and wants a call back right away. A message can be left on phone. Voice message left for [employer].

## 2014-07-10 NOTE — Telephone Encounter (Signed)
CALLED PT TO SCHEDULE NEW PT APPT (HEMATOLOGY). LEFT MESSAGE.  DX: ANEMIA, UNSPECIFIED ANEMIA TYPE REFERRING: [facility]

## 2014-07-11 ENCOUNTER — Ambulatory Visit (INDEPENDENT_AMBULATORY_CARE_PROVIDER_SITE_OTHER): Payer: Medicare Other | Admitting: Internal Medicine

## 2014-07-11 ENCOUNTER — Encounter: Payer: Self-pay | Admitting: Internal Medicine

## 2014-07-11 VITALS — BP 140/80 | HR 88 | Temp 98.0°F | Resp 20 | Ht 63.0 in | Wt 208.0 lb

## 2014-07-11 DIAGNOSIS — D500 Iron deficiency anemia secondary to blood loss (chronic): Secondary | ICD-10-CM

## 2014-07-11 DIAGNOSIS — I10 Essential (primary) hypertension: Secondary | ICD-10-CM

## 2014-07-11 DIAGNOSIS — M150 Primary generalized (osteo)arthritis: Secondary | ICD-10-CM

## 2014-07-11 DIAGNOSIS — M25569 Pain in unspecified knee: Secondary | ICD-10-CM | POA: Diagnosis not present

## 2014-07-11 DIAGNOSIS — M159 Polyosteoarthritis, unspecified: Secondary | ICD-10-CM

## 2014-07-11 NOTE — Patient Instructions (Signed)
Take iron twice daily GI follow-up as scheduled  Avoid aspirin and all anti-inflammatory medication such as ibuprofen and naproxen  Return in 6 weeks for follow-up

## 2014-07-11 NOTE — Progress Notes (Signed)
Pre visit review using our clinic review tool, if applicable. No additional management support is needed unless otherwise documented below in the visit note. 

## 2014-07-11 NOTE — Progress Notes (Signed)
Subjective:    Patient ID: Leslie Benton, female    DOB: Feb 05, 1941, 74 y.o.   MRN: [id number]  HPI 74 year old patient who is seen following a recent hospital discharge.  She was admitted for evaluation of severe iron deficiency anemia in the setting of resting tachycardia.  She received 2 units of packed RBCs and was discharged after a brief hospital admission.  She was seen in follow-up by GI yesterday and 2 days ago.  Her hemoglobin was up to 10 point 8 g percent.  She generally feels well except for significant knee pain. She is disappointed that her surgery has been delayed.  She was noted to have the severe anemia on a preoperative evaluation.  She is scheduled for elective endoscopy in June.  She is on iron therapy once daily  Hospital records reviewed  Past Medical History  Diagnosis Date  . COLONIC POLYPS, HX OF 02/07/2008  . DIABETES MELLITUS, TYPE II 10/27/2009  . GERD 10/14/2006  . HYPERLIPIDEMIA 10/14/2006  . HYPERTENSION 10/14/2006  . HYPOTHYROIDISM 10/14/2006  . IMPAIRED GLUCOSE TOLERANCE 01/06/2009  . KNEE PAIN, RIGHT 07/15/2008  . LOW BACK PAIN 08/04/2007  . Obesity   . DEGENERATIVE JOINT DISEASE 10/14/2006    03/31/11: Pt states arthritis in her hip.  . Anxiety   . Depression     History   Social History  . Marital Status: Married    Spouse Name: N/A  . Number of Children: N/A  . Years of Education: N/A   Occupational History  . Not on file.   Social History Main Topics  . Smoking status: Never Smoker   . Smokeless tobacco: Never Used  . Alcohol Use: No  . Drug Use: No  . Sexual Activity: Not on file   Other Topics Concern  . Not on file   Social History Narrative    Past Surgical History  Procedure Laterality Date  . Hemorrhoid surgery    . Tonsillectomy    . Left knee arthroscopic  April 2014    Dr. Durward Fortes  . Tubal ligation      Family History  Problem Relation Age of Onset  . Heart disease Father     Allergies  Allergen Reactions    . Niacin And Related Hives and Swelling    No anaphalaxis, but strong reactions.  . Lactose Intolerance (Gi) Diarrhea  . Amoxicillin     REACTION: unspecified  . Penicillins     Unknown     Current Outpatient Prescriptions on File Prior to Visit  Medication Sig Dispense Refill  . acetaminophen (TYLENOL) 650 MG CR tablet Take 650 mg by mouth every 8 (eight) hours as needed for pain.    . busPIRone (BUSPAR) 15 MG tablet Take 1 tablet (15 mg total) by mouth 3 (three) times daily. (Patient taking differently: Take 5-15 mg by mouth 3 (three) times daily as needed (anxiety). ) 180 tablet 3  . glimepiride (AMARYL) 4 MG tablet Take 0.5 tablets (2 mg total) by mouth daily with breakfast. 45 tablet 1  . glucose blood (ACCU-CHEK AVIVA) test strip USE TO CHECK BLOOD SUGAR DAILY AND PRN 100 each 12  . levothyroxine (SYNTHROID, LEVOTHROID) 125 MCG tablet take 1 tablet by mouth once daily 90 tablet 3  . losartan (COZAAR) 50 MG tablet Take 1 tablet (50 mg total) by mouth daily. 30 tablet 0  . metFORMIN (GLUCOPHAGE) 1000 MG tablet Take 1 tablet (1,000 mg total) by mouth daily with breakfast. 90 tablet 1  .  omeprazole (PRILOSEC) 20 MG capsule take 1 capsule by mouth once daily 90 capsule 1  . propranolol (INDERAL) 20 MG tablet Take 1 tablet (20 mg total) by mouth 2 (two) times daily. (Patient taking differently: Take 20 mg by mouth 2 (two) times daily. Patient takes as needed) 180 tablet 3   No current facility-administered medications on file prior to visit.    BP 140/80 mmHg  Pulse 88  Temp(Src) 98 F (36.7 C) (Oral)  Resp 20  Ht 5\' 3"  (1.6 m)  Wt 208 lb (94.348 kg)  BMI 36.85 kg/m2  SpO2 97%      Review of Systems  Constitutional: Positive for fatigue.  HENT: Negative for congestion, dental problem, hearing loss, rhinorrhea, sinus pressure, sore throat and tinnitus.   Eyes: Negative for pain, discharge and visual disturbance.  Respiratory: Negative for cough and shortness of breath.    Cardiovascular: Negative for chest pain, palpitations and leg swelling.  Gastrointestinal: Negative for nausea, vomiting, abdominal pain, diarrhea, constipation, blood in stool and abdominal distention.  Genitourinary: Negative for dysuria, urgency, frequency, hematuria, flank pain, vaginal bleeding, vaginal discharge, difficulty urinating, vaginal pain and pelvic pain.  Musculoskeletal: Positive for joint swelling, arthralgias and gait problem.  Skin: Negative for rash.  Neurological: Positive for weakness. Negative for dizziness, syncope, speech difficulty, numbness and headaches.  Hematological: Negative for adenopathy.  Psychiatric/Behavioral: Negative for behavioral problems, dysphoric mood and agitation. The patient is not nervous/anxious.        Objective:   Physical Exam  Constitutional: She is oriented to person, place, and time. She appears well-developed and well-nourished.  HENT:  Head: Normocephalic.  Right Ear: External ear normal.  Left Ear: External ear normal.  Mouth/Throat: Oropharynx is clear and moist.  Eyes: Conjunctivae and EOM are normal. Pupils are equal, round, and reactive to light.  Neck: Normal range of motion. Neck supple. No thyromegaly present.  Cardiovascular: Normal rate, regular rhythm, normal heart sounds and intact distal pulses.   Pulmonary/Chest: Effort normal and breath sounds normal.  Abdominal: Soft. Bowel sounds are normal. She exhibits no mass. There is no tenderness.  Musculoskeletal: Normal range of motion.  Lymphadenopathy:    She has no cervical adenopathy.  Neurological: She is alert and oriented to person, place, and time.  Skin: Skin is warm and dry. No rash noted.  Psychiatric: She has a normal mood and affect. Her behavior is normal.          Assessment & Plan:   Iron deficiency anemia.  Hematest positive stool documented by GI.  Will continue supple, iron, and increased to a twice a day dose Recheck CBC in 6 weeks.   Hopefully can reschedule surgery at that time Osteoarthritis Essential hypertension, stable History of colonic polyps.  Follow-up colonoscopy planned

## 2014-07-12 ENCOUNTER — Telehealth: Payer: Self-pay | Admitting: Hematology & Oncology

## 2014-07-15 ENCOUNTER — Ambulatory Visit (HOSPITAL_BASED_OUTPATIENT_CLINIC_OR_DEPARTMENT_OTHER): Payer: Medicare Other | Admitting: Family

## 2014-07-15 ENCOUNTER — Encounter: Payer: Self-pay | Admitting: Family

## 2014-07-15 ENCOUNTER — Ambulatory Visit: Payer: BLUE CROSS/BLUE SHIELD

## 2014-07-15 ENCOUNTER — Other Ambulatory Visit: Payer: Self-pay | Admitting: Family

## 2014-07-15 ENCOUNTER — Ambulatory Visit: Payer: BLUE CROSS/BLUE SHIELD | Admitting: Family

## 2014-07-15 ENCOUNTER — Ambulatory Visit: Payer: Medicare Other

## 2014-07-15 ENCOUNTER — Other Ambulatory Visit (HOSPITAL_BASED_OUTPATIENT_CLINIC_OR_DEPARTMENT_OTHER): Payer: Medicare Other

## 2014-07-15 ENCOUNTER — Other Ambulatory Visit: Payer: BLUE CROSS/BLUE SHIELD

## 2014-07-15 ENCOUNTER — Ambulatory Visit (HOSPITAL_BASED_OUTPATIENT_CLINIC_OR_DEPARTMENT_OTHER): Payer: Medicare Other

## 2014-07-15 DIAGNOSIS — R718 Other abnormality of red blood cells: Secondary | ICD-10-CM | POA: Diagnosis not present

## 2014-07-15 DIAGNOSIS — D500 Iron deficiency anemia secondary to blood loss (chronic): Secondary | ICD-10-CM | POA: Diagnosis not present

## 2014-07-15 DIAGNOSIS — D649 Anemia, unspecified: Secondary | ICD-10-CM

## 2014-07-15 LAB — CBC WITH DIFFERENTIAL (CANCER CENTER ONLY)
BASO#: 0.1 10*3/uL (ref 0.0–0.2)
BASO%: 1 % (ref 0.0–2.0)
EOS%: 1 % (ref 0.0–7.0)
Eosinophils Absolute: 0.1 10*3/uL (ref 0.0–0.5)
HEMATOCRIT: 35.1 % (ref 34.8–46.6)
HGB: 10.2 g/dL — ABNORMAL LOW (ref 11.6–15.9)
LYMPH#: 2.4 10*3/uL (ref 0.9–3.3)
LYMPH%: 32.9 % (ref 14.0–48.0)
MCH: 20 pg — ABNORMAL LOW (ref 26.0–34.0)
MCHC: 29.1 g/dL — AB (ref 32.0–36.0)
MCV: 69 fL — AB (ref 81–101)
MONO#: 0.5 10*3/uL (ref 0.1–0.9)
MONO%: 7.1 % (ref 0.0–13.0)
NEUT#: 4.3 10*3/uL (ref 1.5–6.5)
NEUT%: 58 % (ref 39.6–80.0)
Platelets: 364 10*3/uL (ref 145–400)
RBC: 5.1 10*6/uL (ref 3.70–5.32)
RDW: 27 % — AB (ref 11.1–15.7)
WBC: 7.4 10*3/uL (ref 3.9–10.0)

## 2014-07-15 LAB — CHCC SATELLITE - SMEAR

## 2014-07-15 MED ORDER — SODIUM CHLORIDE 0.9 % IV SOLN
510.0000 mg | Freq: Once | INTRAVENOUS | Status: AC
  Administered 2014-07-15: 510 mg via INTRAVENOUS
  Filled 2014-07-15: qty 17

## 2014-07-15 NOTE — Telephone Encounter (Signed)
I spoke w NEW PATIENT today to remind them of their appointment with Dr. Ennever. Also, advised them to bring all medication bottles and insurance card information. ° °

## 2014-07-15 NOTE — Progress Notes (Unsigned)
Hematology/Oncology Consultation   Name: Leslie Benton      MRN: [id number]    Location: Room/bed info not found  Date: 07/15/2014 Time:12:37 PM   REFERRING PHYSICIAN: Marletta Lor, MD   REASON FOR CONSULT: Anemia, unspecified anemia type   DIAGNOSIS: ***  HISTORY OF PRESENT ILLNESS: Leslie Benton is a very pleasant 74 yo female with   ROS: All other 10 point review of systems is negative.   PAST MEDICAL HISTORY:   Past Medical History  Diagnosis Date  . COLONIC POLYPS, HX OF 02/07/2008  . DIABETES MELLITUS, TYPE II 10/27/2009  . GERD 10/14/2006  . HYPERLIPIDEMIA 10/14/2006  . HYPERTENSION 10/14/2006  . HYPOTHYROIDISM 10/14/2006  . IMPAIRED GLUCOSE TOLERANCE 01/06/2009  . KNEE PAIN, RIGHT 07/15/2008  . LOW BACK PAIN 08/04/2007  . Obesity   . DEGENERATIVE JOINT DISEASE 10/14/2006    03/31/11: Pt states arthritis in her hip.  . Anxiety   . Depression     ALLERGIES: Allergies  Allergen Reactions  . Niacin And Related Hives and Swelling    No anaphalaxis, but strong reactions.  . Lactose Intolerance (Gi) Diarrhea  . Amoxicillin     REACTION: unspecified  . Penicillins     Unknown       MEDICATIONS:  Current Outpatient Prescriptions on File Prior to Visit  Medication Sig Dispense Refill  . acetaminophen (TYLENOL) 650 MG CR tablet Take 650 mg by mouth every 8 (eight) hours as needed for pain.    . busPIRone (BUSPAR) 15 MG tablet Take 1 tablet (15 mg total) by mouth 3 (three) times daily. (Patient taking differently: Take 5-15 mg by mouth 3 (three) times daily as needed (anxiety). ) 180 tablet 3  . glimepiride (AMARYL) 4 MG tablet Take 0.5 tablets (2 mg total) by mouth daily with breakfast. 45 tablet 1  . glucose blood (ACCU-CHEK AVIVA) test strip USE TO CHECK BLOOD SUGAR DAILY AND PRN 100 each 12  . levothyroxine (SYNTHROID, LEVOTHROID) 125 MCG tablet take 1 tablet by mouth once daily 90 tablet 3  . losartan (COZAAR) 50 MG tablet Take 1 tablet (50 mg total) by mouth daily.  30 tablet 0  . metFORMIN (GLUCOPHAGE) 1000 MG tablet Take 1 tablet (1,000 mg total) by mouth daily with breakfast. 90 tablet 1  . omeprazole (PRILOSEC) 20 MG capsule take 1 capsule by mouth once daily 90 capsule 1  . propranolol (INDERAL) 20 MG tablet Take 1 tablet (20 mg total) by mouth 2 (two) times daily. (Patient taking differently: Take 20 mg by mouth 2 (two) times daily. Patient takes as needed) 180 tablet 3   No current facility-administered medications on file prior to visit.     PAST SURGICAL HISTORY Past Surgical History  Procedure Laterality Date  . Hemorrhoid surgery    . Tonsillectomy    . Left knee arthroscopic  April 2014    Dr. Durward Fortes  . Tubal ligation      FAMILY HISTORY: Family History  Problem Relation Age of Onset  . Heart disease Father     SOCIAL HISTORY:  reports that she has never smoked. She has never used smokeless tobacco. She reports that she does not drink alcohol or use illicit drugs.  PERFORMANCE STATUS: The patient's performance status is {CHL ONC ECOG AT:[id number]}  PHYSICAL EXAM: Most Recent Vital Signs: There were no vitals taken for this visit. {Exam, Complete:17964}  LABORATORY DATA:  No results found for this or any previous visit (from the past 48 hour(s)).  RADIOGRAPHY: No results found.     PATHOLOGY: None  ASSESSMENT/PLAN: ***  All questions were answered. The patient knows to call the clinic with any problems, questions or concerns. We can certainly see the patient much sooner if necessary.  The patient was discussed with and also seen by Dr. Marin [name] and he is in agreement with the aforementioned.   [facility]

## 2014-07-15 NOTE — Progress Notes (Signed)
Hematology/Oncology Consultation   Name: Leslie Benton      MRN: [id number]    Location: Room/bed info not found  Date: 07/15/2014 Time:1:45 PM   REFERRING PHYSICIAN: Marletta Lor, MD  REASON FOR CONSULT: Anemia, unspecified   DIAGNOSIS: Anemia  HISTORY OF PRESENT ILLNESS: Leslie Benton is a very pleasant 74 yo female with a recent history of anemia. While getting preliminary testing prior to having knee surgery she was found to have a Hgb of 7.7 and sent to the ED. Her hemoccult test while in the hospital was negative. She was given 2 units of blood an her Hgb is now 10.2. She is taking iron pills twice daily. She is also taking Prilosec which can block absorption of iron in the gut. We will have her stop taking the oral iron and give her IV iron instead.  She went to see Dr. Cristina Gong with GI after being released from the hospital and her hemoccult testing was positive. She has an elective endoscopy already scheduled for June.  She is very anxious and wants to have her knee replacement as soon as possible. She states that she is "really suffering" with her knee. She will have her CBC checked again in 6 weeks to assess whether or not she is stable enough to have surgery.  She denies fatigue, chills, n/v, cough, rash, dizziness, headaches, SOB, chest pain, palpitations, abdominal pain, constipation, diarrhea, blood in urine. She has occasional swelling in her left ankle that comes and goes. No tenderness, numbness or tingling in her extremities.  Her appetite is good and she is staying hydrated. Her weight is stable.  No family history of anemia.  She has no personal cancer history. Her father had pancreatic cancer and her brother had cancer of the adrenal gland.  She is diabetic and on Metformin.  She has stopped taking anti-inflammatories.  She is retired from [employer]. She misses her family and is very excited to be going to visit them next month.   ROS: All other 10 point  review of systems is negative.   PAST MEDICAL HISTORY:   Past Medical History  Diagnosis Date  . COLONIC POLYPS, HX OF 02/07/2008  . DIABETES MELLITUS, TYPE II 10/27/2009  . GERD 10/14/2006  . HYPERLIPIDEMIA 10/14/2006  . HYPERTENSION 10/14/2006  . HYPOTHYROIDISM 10/14/2006  . IMPAIRED GLUCOSE TOLERANCE 01/06/2009  . KNEE PAIN, RIGHT 07/15/2008  . LOW BACK PAIN 08/04/2007  . Obesity   . DEGENERATIVE JOINT DISEASE 10/14/2006    03/31/11: Pt states arthritis in her hip.  . Anxiety   . Depression     ALLERGIES: Allergies  Allergen Reactions  . Niacin And Related Hives and Swelling    No anaphalaxis, but strong reactions.  . Lactose Intolerance (Gi) Diarrhea  . Amoxicillin     REACTION: unspecified  . Penicillins     Unknown       MEDICATIONS:  Current Outpatient Prescriptions on File Prior to Visit  Medication Sig Dispense Refill  . busPIRone (BUSPAR) 15 MG tablet Take 1 tablet (15 mg total) by mouth 3 (three) times daily. (Patient taking differently: Take 5-15 mg by mouth 3 (three) times daily as needed (anxiety). ) 180 tablet 3  . glimepiride (AMARYL) 4 MG tablet Take 0.5 tablets (2 mg total) by mouth daily with breakfast. 45 tablet 1  . glucose blood (ACCU-CHEK AVIVA) test strip USE TO CHECK BLOOD SUGAR DAILY AND PRN 100 each 12  . levothyroxine (SYNTHROID, LEVOTHROID) 125 MCG tablet  take 1 tablet by mouth once daily 90 tablet 3  . metFORMIN (GLUCOPHAGE) 1000 MG tablet Take 1 tablet (1,000 mg total) by mouth daily with breakfast. 90 tablet 1  . omeprazole (PRILOSEC) 20 MG capsule take 1 capsule by mouth once daily 90 capsule 1  . propranolol (INDERAL) 20 MG tablet Take 1 tablet (20 mg total) by mouth 2 (two) times daily. (Patient taking differently: Take 20 mg by mouth 2 (two) times daily. Patient takes as needed) 180 tablet 3   No current facility-administered medications on file prior to visit.     PAST SURGICAL HISTORY Past Surgical History  Procedure Laterality Date  .  Hemorrhoid surgery    . Tonsillectomy    . Left knee arthroscopic  April 2014    Dr. Durward Fortes  . Tubal ligation      FAMILY HISTORY: Family History  Problem Relation Age of Onset  . Heart disease Father     SOCIAL HISTORY:  reports that she has never smoked. She has never used smokeless tobacco. She reports that she does not drink alcohol or use illicit drugs.  PERFORMANCE STATUS: The patient's performance status is 0 - Asymptomatic  PHYSICAL EXAM: Most Recent Vital Signs: Blood pressure 184/84, pulse 88, temperature 97.4 F (36.3 C), temperature source Oral, resp. rate 16, height 5\' 3"  (1.6 m), weight 208 lb (94.348 kg). BP 184/84 mmHg  Pulse 88  Temp(Src) 97.4 F (36.3 C) (Oral)  Resp 16  Ht 5\' 3"  (1.6 m)  Wt 208 lb (94.348 kg)  BMI 36.85 kg/m2  General Appearance:    Alert, cooperative, no distress, appears stated age  Head:    Normocephalic, without obvious abnormality, atraumatic  Eyes:    PERRL, conjunctiva/corneas clear, EOM's intact, fundi    benign, both eyes        Throat:   Lips, mucosa, and tongue normal; teeth and gums normal  Neck:   Supple, symmetrical, trachea midline, no adenopathy;    thyroid:  no enlargement/tenderness/nodules; no carotid   bruit or JVD  Back:     Symmetric, no curvature, ROM normal, no CVA tenderness  Lungs:     Clear to auscultation bilaterally, respirations unlabored  Chest Wall:    No tenderness or deformity   Heart:    Regular rate and rhythm, S1 and S2 normal, no murmur, rub   or gallop     Abdomen:     Soft, non-tender, bowel sounds active all four quadrants,    no masses, no organomegaly        Extremities:   Extremities normal, atraumatic, no cyanosis or edema  Pulses:   2+ and symmetric all extremities  Skin:   Skin color, texture, turgor normal, no rashes or lesions  Lymph nodes:   Cervical, supraclavicular, and axillary nodes normal  Neurologic:   CNII-XII intact, normal strength, sensation and reflexes     throughout   LABORATORY DATA:  Results for orders placed or performed in visit on 07/15/14 (from the past 48 hour(s))  CBC with Differential [facility])     Status: Abnormal   Collection Time: 07/15/14 12:36 PM  Result Value Ref Range   WBC 7.4 3.9 - 10.0 10e3/uL   RBC 5.10 3.70 - 5.32 10e6/uL   HGB 10.2 (L) 11.6 - 15.9 g/dL   HCT 35.1 34.8 - 46.6 %   MCV 69 (L) 81 - 101 fL   MCH 20.0 (L) 26.0 - 34.0 pg   MCHC 29.1 (L) 32.0 - 36.0  g/dL   RDW 27.0 (H) 11.1 - 15.7 %   Platelets 364 145 - 400 10e3/uL   NEUT# 4.3 1.5 - 6.5 10e3/uL   LYMPH# 2.4 0.9 - 3.3 10e3/uL   MONO# 0.5 0.1 - 0.9 10e3/uL   Eosinophils Absolute 0.1 0.0 - 0.5 10e3/uL   BASO# 0.1 0.0 - 0.2 10e3/uL   NEUT% 58.0 39.6 - 80.0 %   LYMPH% 32.9 14.0 - 48.0 %   MONO% 7.1 0.0 - 13.0 %   EOS% 1.0 0.0 - 7.0 %   BASO% 1.0 0.0 - 2.0 %      RADIOGRAPHY: No results found.     PATHOLOGY: None  ASSESSMENT/PLAN: Leslie Benton is a very pleasant 74 yo female with a recent history of anemia. While getting preliminary testing prior to having knee surgery she was found to have a Hgb of 7.7 and sent to the ED. Her hemoccult test while in the hospital was negative. She was given 2 units of blood. Her Hgb is now 10.2 and MCV 69. She will stop taking the PO iron. We will give her Madilyn Hook 510 today.  We will see her back in 3 weeks for labs and follow-up to assess how she responded. She states that she will have her endoscopy after her knee replacement.   All questions were answered. The patient knows to call the clinic with any problems, questions or concerns. We can certainly see the patient much sooner if necessary.  The patient was discussed with and also seen by Dr. Marin [name] and he is in agreement with the aforementioned.   [facility]    Addendum:  I saw and examined the patient with Juliahna Wiswell. We will get her blood smear. Her blood smear did show some target cells. She had some anisocytosis. She has a microcytic red cells.  It looks like that there is iron deficiency.  At her age, I saw to worry about GI blood loss. She is taking a PPI. This might be preventing iron absorption. Pressure does take anti-inflammatories. She has a bad knee and needs to have this report placed. It is more than likely that is the anti-inflammatories that are causing some gastritis.  We will go ahead and give her some IV iron. I want to try to get her hemoglobin as best as possible so that she can have surgery as I suspect that she probably will lose some blood with surgery.  We will get her back in another 3 weeks. I suspect that she probably would not have knee surgery until May given the busy schedule of the orthopedic surgeons.

## 2014-07-15 NOTE — Patient Instructions (Signed)

## 2014-07-16 LAB — IRON AND TIBC CHCC
%SAT: 13 % — ABNORMAL LOW (ref 21–57)
IRON: 55 ug/dL (ref 41–142)
TIBC: 420 ug/dL (ref 236–444)
UIBC: 365 ug/dL (ref 120–384)

## 2014-07-16 LAB — FERRITIN CHCC: Ferritin: 9 ng/ml (ref 9–269)

## 2014-07-17 ENCOUNTER — Encounter: Payer: Self-pay | Admitting: Nurse Practitioner

## 2014-07-17 LAB — HEMOGLOBINOPATHY EVALUATION
HEMOGLOBIN OTHER: 0 %
HGB A2 QUANT: 2.4 % (ref 2.2–3.2)
HGB A: 97.6 % (ref 96.8–97.8)
HGB F QUANT: 0 % (ref 0.0–2.0)
Hgb S Quant: 0 %

## 2014-07-17 LAB — RETICULOCYTES (CHCC)
ABS RETIC: 46.9 10*3/uL (ref 19.0–186.0)
RBC.: 5.21 MIL/uL — AB (ref 3.87–5.11)
Retic Ct Pct: 0.9 % (ref 0.4–2.3)

## 2014-07-17 LAB — ERYTHROPOIETIN: ERYTHROPOIETIN: 85.8 m[IU]/mL — AB (ref 2.6–18.5)

## 2014-07-17 NOTE — Progress Notes (Signed)
Medical Clearance for Leslie Benton sent to Dr. Virgina Jock office ATTN: Abigail Butts  [phone]. Confirmation received. Instructions stated we will give iron on 4/18 during her next office visit.

## 2014-07-22 ENCOUNTER — Telehealth: Payer: Self-pay | Admitting: Internal Medicine

## 2014-07-22 MED ORDER — LEVOTHYROXINE SODIUM 150 MCG PO TABS: 150.0000 ug | ORAL_TABLET | Freq: Every day | ORAL | Status: DC

## 2014-07-22 NOTE — Telephone Encounter (Signed)
Pt called back, Confirmed compliance with patient's daily use of levothyroxin she is taking everyday. Told her Dr.K said can increase levothyroxine to 150 mcg daily. Repeat TSH in 6 weeks Would not recommend switching to Armour Thyroid. Pt verbalized understanding. Rx sent to pharmacy.

## 2014-07-22 NOTE — Telephone Encounter (Signed)
Confirm compliance with patient's daily use of levothyroxin.  If she has been 100% compliant, increase levothyroxine to 0.15 milligrams daily.  Repeat TSH in 6 weeks Would not recommend switching to Armour Thyroid

## 2014-07-22 NOTE — Telephone Encounter (Signed)
Pt called to say that she saw in the newspaper that there was a substitute for the following med  levothyroxine (SYNTHROID, LEVOTHROID) 125 MCG tablet   She said ARMOUR THYROID is the alternative and she would like to try it Would like a call back about this

## 2014-07-22 NOTE — Telephone Encounter (Signed)
Please see message and advise 

## 2014-07-22 NOTE — Telephone Encounter (Signed)
Left message on voicemail to call office.  

## 2014-07-23 ENCOUNTER — Other Ambulatory Visit: Payer: Self-pay | Admitting: Internal Medicine

## 2014-07-23 ENCOUNTER — Telehealth: Payer: Self-pay | Admitting: Internal Medicine

## 2014-07-23 DIAGNOSIS — E039 Hypothyroidism, unspecified: Secondary | ICD-10-CM

## 2014-07-23 NOTE — Telephone Encounter (Signed)
Spoke to pt again told her to make lab appt in 6 weeks for TSH. Pt verbalized understanding and asked if someone would call her to schedule. Told her okay will have schedulers call you. Pt verbalized understanding. Order for TSH put in EPIC.

## 2014-07-23 NOTE — Telephone Encounter (Signed)
Pt would like to know if updated clearnace for knee surgery has been located. Dr Elmyra Ricks is waiting on paperwork  pls leave message as pt is usually there and cannot get to phone.

## 2014-07-23 NOTE — Telephone Encounter (Signed)
Called Dr. Milford Cage office and spoke to [place], she said [facility] surgery coordinator received paperwork yesterday from Dr.K postponing surgery for 4-6 weeks due to anemia. Told her okay thank you just wanted to make sure you received it.

## 2014-07-23 NOTE — Telephone Encounter (Signed)
Spoke to pt, told her Dr. Anne Fu office did receive paperwork from Dr.K. Pt said Leslie Benton called her this morning. Told her okay.

## 2014-07-24 NOTE — Progress Notes (Signed)
Patient called in today asking questions regarding my chart info.  Instructed patient to call Dr Inda Merlin ( PCP) and office of Dr Wynelle Link with any questions.

## 2014-07-26 ENCOUNTER — Other Ambulatory Visit: Payer: Self-pay | Admitting: Internal Medicine

## 2014-08-05 ENCOUNTER — Ambulatory Visit (HOSPITAL_BASED_OUTPATIENT_CLINIC_OR_DEPARTMENT_OTHER): Payer: Medicare Other

## 2014-08-05 ENCOUNTER — Ambulatory Visit: Payer: BLUE CROSS/BLUE SHIELD

## 2014-08-05 ENCOUNTER — Ambulatory Visit (HOSPITAL_BASED_OUTPATIENT_CLINIC_OR_DEPARTMENT_OTHER): Payer: Medicare Other | Admitting: Hematology & Oncology

## 2014-08-05 ENCOUNTER — Other Ambulatory Visit (HOSPITAL_BASED_OUTPATIENT_CLINIC_OR_DEPARTMENT_OTHER): Payer: Medicare Other

## 2014-08-05 ENCOUNTER — Encounter: Payer: Self-pay | Admitting: Hematology & Oncology

## 2014-08-05 VITALS — BP 157/68 | HR 84 | Temp 98.1°F | Resp 16 | Ht 63.0 in | Wt 208.0 lb

## 2014-08-05 DIAGNOSIS — D500 Iron deficiency anemia secondary to blood loss (chronic): Secondary | ICD-10-CM

## 2014-08-05 LAB — CBC WITH DIFFERENTIAL (CANCER CENTER ONLY)
BASO#: 0.1 10*3/uL (ref 0.0–0.2)
BASO%: 1.1 % (ref 0.0–2.0)
EOS ABS: 0.1 10*3/uL (ref 0.0–0.5)
EOS%: 1.1 % (ref 0.0–7.0)
HEMATOCRIT: 41 % (ref 34.8–46.6)
HGB: 12.6 g/dL (ref 11.6–15.9)
LYMPH#: 2.2 10*3/uL (ref 0.9–3.3)
LYMPH%: 40.6 % (ref 14.0–48.0)
MCH: 23.4 pg — ABNORMAL LOW (ref 26.0–34.0)
MCHC: 30.7 g/dL — AB (ref 32.0–36.0)
MCV: 76 fL — ABNORMAL LOW (ref 81–101)
MONO#: 0.4 10*3/uL (ref 0.1–0.9)
MONO%: 8.1 % (ref 0.0–13.0)
NEUT#: 2.6 10*3/uL (ref 1.5–6.5)
NEUT%: 49.1 % (ref 39.6–80.0)
Platelets: 299 10*3/uL (ref 145–400)
RBC: 5.39 10*6/uL — ABNORMAL HIGH (ref 3.70–5.32)
RDW: 32.4 % — ABNORMAL HIGH (ref 11.1–15.7)
WBC: 5.3 10*3/uL (ref 3.9–10.0)

## 2014-08-05 MED ORDER — SODIUM CHLORIDE 0.9 % IV SOLN
Freq: Once | INTRAVENOUS | Status: AC
  Administered 2014-08-05: 14:00:00 via INTRAVENOUS

## 2014-08-05 MED ORDER — SODIUM CHLORIDE 0.9 % IV SOLN
510.0000 mg | Freq: Once | INTRAVENOUS | Status: AC
  Administered 2014-08-05: 510 mg via INTRAVENOUS
  Filled 2014-08-05: qty 17

## 2014-08-05 NOTE — Patient Instructions (Signed)

## 2014-08-05 NOTE — Progress Notes (Signed)
Hematology and Oncology Follow Up Visit  Leslie Benton [id number] 1941/01/19 74 y.o. 08/05/2014   Principle Diagnosis:    iron deficiency anemia secondary to GI blood loss    severe osteoarthritis of the left knee  Current Therapy:     IV iron as indicated -patient to receive a second dose of Feraheme today     Interim History:  Leslie Benton is  Back for follow-up. We first saw Leslie back about a month ago. She is still awaiting  knee surgery. She was denied knee surgery  initially because of anemia. She  was found to have some GI blood loss. We first saw Leslie, Leslie ferritin was only 9. Leslie iron saturation was 13%. Leslie erythropoietin level was 66.   She did get a dose of iron. This made Leslie feel a little bit better. She has a little more energy. She is still in a wheelchair because of Leslie arthritis. She's not sure when this will be done. Apparently, she has to see a cardiologist.  She's had no bleeding. She is I better. She is on metformin and Amaryl.    Leslie appetite has been okay. She's had no change in bowel or bladder habits. Medications:  Current outpatient prescriptions:  .  busPIRone (BUSPAR) 15 MG tablet, Take 1 tablet (15 mg total) by mouth 3 (three) times daily. (Patient taking differently: Take 5-15 mg by mouth 3 (three) times daily as needed (anxiety). ), Disp: 180 tablet, Rfl: 3 .  glimepiride (AMARYL) 4 MG tablet, TAKE 1/2 A TABLET BY MOUTH DAILY WITH BREAKFAST, Disp: 45 tablet, Rfl: 1 .  glucose blood (ACCU-CHEK AVIVA) test strip, USE TO CHECK BLOOD SUGAR DAILY AND PRN, Disp: 100 each, Rfl: 12 .  levothyroxine (SYNTHROID, LEVOTHROID) 150 MCG tablet, Take 1 tablet (150 mcg total) by mouth daily., Disp: 90 tablet, Rfl: 3 .  metFORMIN (GLUCOPHAGE) 1000 MG tablet, Take 1 tablet (1,000 mg total) by mouth daily with breakfast. (Patient taking differently: Take 1,000 mg by mouth at bedtime. ), Disp: 90 tablet, Rfl: 1 .  omeprazole (PRILOSEC) 20 MG capsule, take 1 capsule by mouth  once daily (Patient taking differently: as needed), Disp: 90 capsule, Rfl: 1 .  propranolol (INDERAL) 20 MG tablet, Take 1 tablet (20 mg total) by mouth 2 (two) times daily. (Patient taking differently: Take 20 mg by mouth as needed. Patient takes as needed), Disp: 180 tablet, Rfl: 3  Allergies:  Allergies  Allergen Reactions  . Niacin And Related Hives and Swelling    No anaphalaxis, but strong reactions.  . Lactose Intolerance (Gi) Diarrhea  . Amoxicillin     REACTION: unspecified  . Penicillins     Unknown     Past Medical History, Surgical history, Social history, and Family History were reviewed and updated.  Review of Systems:  as above  Physical Exam:  height is 5\' 3"  (1.6 m) and weight is 208 lb (94.348 kg). Leslie oral temperature is 98.1 F (36.7 C). Leslie blood pressure is 157/68 and Leslie pulse is 84. Leslie respiration is 16.   Wt Readings from Last 3 Encounters:  08/05/14 208 lb (94.348 kg)  07/15/14 208 lb (94.348 kg)  07/11/14 208 lb (94.348 kg)      elderly appearing white female in a wheelchair. Head and neck exam shows no ocular or oral lesions. She has no palpable cervical or supraclavicular lymph nodes. Leslie conjunctiva are pink. Lungs are clear. There may be some slight decrease at the bases. Cardiac exam  regular rate and rhythm with no murmurs, rubs or bruits. Abdomen is soft. She has good bowel sounds. She is mildly obese. She has no fluid wave. There is no palpable liver or spleen tip. Back exam shows no tenderness over the spine, ribs or hips. Extremities shows no clubbing, cyanosis or edema. She has some swelling of the left knee. She has decreased range of motion of the left knee.   Skin exam shows no rashes, ecchymoses or petechia. Neurological exam shows no focal deficits.  Lab Results  Component Value Date   WBC 5.3 08/05/2014   HGB 12.6 08/05/2014   HCT 41.0 08/05/2014   MCV 76* 08/05/2014   PLT 299 08/05/2014     Chemistry      Component Value  Date/Time   NA 142 07/04/2014 0525   K 4.0 07/04/2014 0525   CL 107 07/04/2014 0525   CO2 24 07/04/2014 0525   BUN 17 07/04/2014 0525   CREATININE 0.89 07/04/2014 0525      Component Value Date/Time   CALCIUM 8.7 07/04/2014 0525   ALKPHOS 83 07/04/2014 0525   AST 17 07/04/2014 0525   ALT 37* 07/04/2014 0525   BILITOT 0.5 07/04/2014 0525         Impression and Plan: Leslie Benton is  74 year old white female with iron deficiency anemia. She will get another dose of iron today. She has responded very nicely. Leslie MCV has come up but still a little low.   She can have the surgery from my point of view. The extra iron that we are given Leslie will help Leslie.   I don't think we have to see Leslie back in the office. I don't think we really are improving Leslie overall medical health at this point.    if we need to see Leslie back in the future, we certainly will.   Volanda Napoleon, MD 4/18/20161:39 PM

## 2014-08-06 LAB — IRON AND TIBC CHCC
%SAT: 16 % — ABNORMAL LOW (ref 21–57)
IRON: 54 ug/dL (ref 41–142)
TIBC: 334 ug/dL (ref 236–444)
UIBC: 280 ug/dL (ref 120–384)

## 2014-08-06 LAB — FERRITIN CHCC: Ferritin: 81 ng/ml (ref 9–269)

## 2014-08-08 ENCOUNTER — Ambulatory Visit (INDEPENDENT_AMBULATORY_CARE_PROVIDER_SITE_OTHER): Payer: Medicare Other | Admitting: Cardiology

## 2014-08-08 ENCOUNTER — Ambulatory Visit: Payer: BLUE CROSS/BLUE SHIELD

## 2014-08-08 ENCOUNTER — Encounter: Payer: Self-pay | Admitting: Cardiology

## 2014-08-08 VITALS — BP 166/92 | HR 84 | Ht 63.0 in | Wt 210.0 lb

## 2014-08-08 DIAGNOSIS — R778 Other specified abnormalities of plasma proteins: Secondary | ICD-10-CM

## 2014-08-08 DIAGNOSIS — R072 Precordial pain: Secondary | ICD-10-CM

## 2014-08-08 DIAGNOSIS — R7989 Other specified abnormal findings of blood chemistry: Secondary | ICD-10-CM | POA: Diagnosis not present

## 2014-08-08 NOTE — Patient Instructions (Signed)
Your physician has requested that you have a lexiscan myoview. For further information please visit [email or web address]. Please follow instruction sheet, as given.  Your physician recommends that you schedule a follow-up appointment AS NEEDED.

## 2014-08-08 NOTE — Progress Notes (Signed)
Cardiology Office Note   Date:  08/09/2014   ID:  Hoa, Briggs [birth year], MRN [id number]  PCP:  [name], MD  Cardiologist:   Minus Breeding, MD   Chief Complaint  Patient presents with  . Chest Pain      History of Present Illness: Leslie Benton is a 74 y.o. female who presents for evaluation elevated troponins. She's also preop for knee surgery. She was in the hospital recently with anemia.this is iron deficiency apparently from GI blood loss but I don't see that there is no clear etiology identified. She has significant knee pain and is to have knee surgery. However, in the hospital her troponins were mildly elevated. The peak was 0.17. Her hemoglobin went down as low as 7.7. She doesn't recall ever having any chest discomfort, neck or arm discomfort. She doesn't have palpitations, presyncope or syncope. She's very limited by her joint pains. She did have an echocardiogram at that time that demonstrated normal wall motion and no other abnormalities. EF was 55%. She is sent here to follow-up on the elevated troponins and for preoperative evaluation.   She did have an echocardiogram which demonstrated no significant valvular abnormalities and well preserved ejection fraction.    Past Medical History  Diagnosis Date  . COLONIC POLYPS, HX OF 02/07/2008  . DIABETES MELLITUS, TYPE II 10/27/2009  . GERD 10/14/2006  . HYPERLIPIDEMIA 10/14/2006  . HYPERTENSION 10/14/2006  . HYPOTHYROIDISM 10/14/2006  . LOW BACK PAIN 08/04/2007  . Obesity   . DEGENERATIVE JOINT DISEASE 10/14/2006    03/31/11: Pt states arthritis in her hip.  . Anxiety   . Depression     Past Surgical History  Procedure Laterality Date  . Hemorrhoid surgery    . Tonsillectomy    . Left knee arthroscopic  April 2014    Dr. Durward Fortes  . Tubal ligation       Current Outpatient Prescriptions  Medication Sig Dispense Refill  . busPIRone (BUSPAR) 15 MG tablet Take 1 tablet (15 mg total) by  mouth 3 (three) times daily. (Patient taking differently: Take 5-15 mg by mouth 3 (three) times daily as needed (anxiety). ) 180 tablet 3  . glimepiride (AMARYL) 4 MG tablet TAKE 1/2 A TABLET BY MOUTH DAILY WITH BREAKFAST 45 tablet 1  . glucose blood (ACCU-CHEK AVIVA) test strip USE TO CHECK BLOOD SUGAR DAILY AND PRN 100 each 12  . levothyroxine (SYNTHROID, LEVOTHROID) 150 MCG tablet Take 1 tablet (150 mcg total) by mouth daily. 90 tablet 3  . metFORMIN (GLUCOPHAGE) 1000 MG tablet Take 1 tablet (1,000 mg total) by mouth daily with breakfast. (Patient taking differently: Take 1,000 mg by mouth at bedtime. ) 90 tablet 1  . omeprazole (PRILOSEC) 20 MG capsule take 1 capsule by mouth once daily (Patient taking differently: as needed) 90 capsule 1  . propranolol (INDERAL) 20 MG tablet Take 1 tablet (20 mg total) by mouth 2 (two) times daily. (Patient taking differently: Take 20 mg by mouth as needed. Patient takes as needed) 180 tablet 3   No current facility-administered medications for this visit.    Allergies:   Niacin and related; Lactose intolerance (gi); Amoxicillin; and Penicillins    Social History:  The patient  reports that she has never smoked. She has never used smokeless tobacco. She reports that she does not drink alcohol or use illicit drugs.   Family History:  The patient's family history includes Heart attack (age of onset: 24) in her father;  Kidney failure in her mother.    ROS:  Please see the history of present illness.   Otherwise, review of systems are positive for none.   All other systems are reviewed and negative.    PHYSICAL EXAM: VS:  BP 166/92 mmHg  Pulse 84  Ht 5\' 3"  (1.6 m)  Wt 210 lb (95.255 kg)  BMI 37.21 kg/m2 , BMI Body mass index is 37.21 kg/(m^2). GENERAL:  Well appearing HEENT:  Pupils equal round and reactive, fundi not visualized, oral mucosa unremarkable NECK:  No jugular venous distention, waveform within normal limits, carotid upstroke brisk and  symmetric, no bruits, no thyromegaly LYMPHATICS:  No cervical, inguinal adenopathy LUNGS:  Clear to auscultation bilaterally BACK:  No CVA tenderness CHEST:  Unremarkable HEART:  PMI not displaced or sustained,S1 and S2 within normal limits, no S3, no S4, no clicks, no rubs, no murmurs ABD:  Flat, positive bowel sounds normal in frequency in pitch, no bruits, no rebound, no guarding, no midline pulsatile mass, no hepatomegaly, no splenomegaly EXT:  2 plus pulses throughout, no edema, no cyanosis no clubbing SKIN:  No rashes no nodules NEURO:  Cranial nerves II through XII grossly intact, motor grossly intact throughout PSYCH:  Cognitively intact, oriented to person place and time    EKG:  EKG is not ordered today. The ekg ordered today demonstrates sinus rhythm, rate 120, axis within normal limits, borderline QTC prolongation, poor anterior R wave progression cannot exclude old anteroseptal MI. 08/09/2014   Recent Labs: 09/28/2013: Magnesium 2.1 07/03/2014: B Natriuretic Peptide 65.0; TSH 11.610* 07/04/2014: ALT 37*; BUN 17; Creatinine 0.89; Potassium 4.0; Sodium 142 08/05/2014: Hemoglobin 12.6; Platelets 299    Lipid Panel    Component Value Date/Time   CHOL 198 07/03/2014 0500   TRIG 157* 07/03/2014 0500   HDL 56 07/03/2014 0500   CHOLHDL 3.5 07/03/2014 0500   VLDL 31 07/03/2014 0500   LDLCALC 111* 07/03/2014 0500   LDLDIRECT 155.3 04/25/2013 0955      Wt Readings from Last 3 Encounters:  08/08/14 210 lb (95.255 kg)  08/05/14 208 lb (94.348 kg)  07/15/14 208 lb (94.348 kg)      Other studies Reviewed: Additional studies/ records that were reviewed today include: Hospital records. Review of the above records demonstrates:  Please see elsewhere in the note.     ASSESSMENT AND PLAN:  ELEVATED TROPONIN:  The patient had an elevated troponin is likely related to her severe anemia. However, she does have significant risk factors. She would have to be screened with a  treadmill test prior to knee surgery. However, she could not do this. Therefore, she will have a [employer].  HTN:  Her blood pressure is elevated. I would like for her to keep a blood pressure diary. She will likely need titration of her medications.  HYPOTHYROIDISM:  Her TSH recently was 11. I will defer to [name], MD   Current medicines are reviewed at length with the patient today.  The patient does not have concerns regarding medicines.  The following changes have been made:  no change  Labs/ tests ordered today include: Lexiscan Myoview.  Orders Placed This Encounter  Procedures  . Myocardial Perfusion Imaging     Disposition:   FU with me as needed    Signed, Minus Breeding, MD  08/09/2014 8:10 AM    [place]

## 2014-08-09 ENCOUNTER — Telehealth (HOSPITAL_COMMUNITY): Payer: Self-pay

## 2014-08-09 ENCOUNTER — Encounter: Payer: Self-pay | Admitting: Cardiology

## 2014-08-09 DIAGNOSIS — H40053 Ocular hypertension, bilateral: Secondary | ICD-10-CM | POA: Diagnosis not present

## 2014-08-09 NOTE — Telephone Encounter (Signed)
Encounter complete. 

## 2014-08-13 ENCOUNTER — Ambulatory Visit (HOSPITAL_COMMUNITY)
Admission: RE | Admit: 2014-08-13 | Discharge: 2014-08-13 | Disposition: A | Discharge status: Home / Self Care | Payer: Medicare Other | Source: Ambulatory Visit | Attending: Cardiology | Admitting: Cardiology

## 2014-08-13 DIAGNOSIS — Z8249 Family history of ischemic heart disease and other diseases of the circulatory system: Secondary | ICD-10-CM | POA: Diagnosis not present

## 2014-08-13 DIAGNOSIS — R5383 Other fatigue: Secondary | ICD-10-CM | POA: Diagnosis not present

## 2014-08-13 DIAGNOSIS — R072 Precordial pain: Secondary | ICD-10-CM | POA: Diagnosis not present

## 2014-08-13 DIAGNOSIS — R42 Dizziness and giddiness: Secondary | ICD-10-CM | POA: Insufficient documentation

## 2014-08-13 DIAGNOSIS — I10 Essential (primary) hypertension: Secondary | ICD-10-CM | POA: Insufficient documentation

## 2014-08-13 DIAGNOSIS — R002 Palpitations: Secondary | ICD-10-CM | POA: Insufficient documentation

## 2014-08-13 DIAGNOSIS — E669 Obesity, unspecified: Secondary | ICD-10-CM | POA: Insufficient documentation

## 2014-08-13 DIAGNOSIS — R0609 Other forms of dyspnea: Secondary | ICD-10-CM | POA: Insufficient documentation

## 2014-08-13 DIAGNOSIS — E785 Hyperlipidemia, unspecified: Secondary | ICD-10-CM | POA: Diagnosis not present

## 2014-08-13 DIAGNOSIS — R7989 Other specified abnormal findings of blood chemistry: Secondary | ICD-10-CM

## 2014-08-13 DIAGNOSIS — E119 Type 2 diabetes mellitus without complications: Secondary | ICD-10-CM | POA: Diagnosis not present

## 2014-08-13 DIAGNOSIS — R778 Other specified abnormalities of plasma proteins: Secondary | ICD-10-CM

## 2014-08-13 MED ORDER — TECHNETIUM TC 99M SESTAMIBI GENERIC - CARDIOLITE
10.8000 | Freq: Once | INTRAVENOUS | Status: AC | PRN
  Administered 2014-08-13: 11 via INTRAVENOUS

## 2014-08-13 MED ORDER — REGADENOSON 0.4 MG/5ML IV SOLN
0.4000 mg | Freq: Once | INTRAVENOUS | Status: AC
  Administered 2014-08-13: 0.4 mg via INTRAVENOUS

## 2014-08-13 MED ORDER — TECHNETIUM TC 99M SESTAMIBI GENERIC - CARDIOLITE
31.0000 | Freq: Once | INTRAVENOUS | Status: AC | PRN
  Administered 2014-08-13: 31 via INTRAVENOUS

## 2014-08-13 NOTE — Procedures (Addendum)
[facility] [address] [place] [address] [phone]  Cardiology Nuclear Med Study  TALA EBER is a 74 y.o. female     MRN : [id number]     DOB: [birth year]  Procedure Date: 08/13/2014  Nuclear Med Background Indication for Stress Test:  Evaluation for Ischemia, Surgical Clearance, [facility] and Elevated troponins when in the hospital 07/02/2014 History:  No prior cardiac or respiratory history reported;No prior NUC MPI for comaprison. Cardiac Risk Factors: Family History - CAD, Hypertension, Lipids, NIDDM and Obesity  Symptoms:  Dizziness, DOE, Fatigue, Light-Headedness, Nausea, Palpitations and tachycardia   Nuclear Pre-Procedure Caffeine/Decaff Intake:  7:00pm NPO After: 3:00am   IV Site: R Forearm  IV 0.9% NS with Angio Cath:  22g  Chest Size (in):  n/a IV Started by: Larene Beach, RN  Height: 5\' 3"  (1.6 m)  Cup Size: C  BMI:  Body mass index is 37.21 kg/(m^2). Weight:  210 lb (95.255 kg)   Tech Comments:  n/a    Nuclear Med Study 1 or 2 day study: 1 day  Stress Test Type:  [place] Provider:  Minus Breeding, MD   Resting Radionuclide: Technetium 71m Sestamibi  Resting Radionuclide Dose: 10.8 mCi   Stress Radionuclide:  Technetium 37m Sestamibi  Stress Radionuclide Dose: 31.0 mCi           Stress Protocol Rest HR: 80 Stress HR: 86  Rest BP: 199/90 Stress BP: 171/69  Exercise Time (min): n/a METS: n/a          Dose of Adenosine (mg):  n/a Dose of Lexiscan: 0.4 mg  Dose of Atropine (mg): n/a Dose of Dobutamine: n/a mcg/kg/min (at max HR)  Stress Test Technologist: Leane Para, CCT Nuclear Technologist: Imagene Riches, CNMT   Rest Procedure:  Myocardial perfusion imaging was performed at rest 45 minutes following the intravenous administration of Technetium 62m Sestamibi. Stress Procedure:  The patient received IV Lexiscan 0.4 mg over 15-seconds.  Technetium 74m Sestamibi  injected IV at 30-seconds.  There were no significant changes with Lexiscan.  Quantitative spect images were obtained after a 45 minute delay.  Transient Ischemic Dilatation (Normal <1.22):  1.30  QGS EDV:  80 ml QGS ESV:  30 ml LV Ejection Fraction: 62%  Rest ECG: NSR - Normal EKG  Stress ECG: No significant change from baseline ECG  QPS Raw Data Images:  Normal; no motion artifact; normal heart/lung ratio. Stress Images:  Normal homogeneous uptake in all areas of the myocardium. Rest Images:  Normal homogeneous uptake in all areas of the myocardium. Subtraction (SDS):  No reversibility is appreciated.  Impression Exercise Capacity:  Lexiscan with no exercise. BP Response:  Normal blood pressure response. Clinical Symptoms:  No symptoms. ECG Impression:  No significant ECG changes with Lexiscan. Comparison with Prior Nuclear Study: No previous nuclear study performed  Overall Impression:  Low risk stress nuclear study without reversible ischemia. TID was abnormal at 1.30, possibly related to hypertension.  LV Wall Motion:  NL LV Function; NL Wall Motion; LVEF 62%.  Pixie Casino, MD, [facility] Board Certified in Nuclear Cardiology Attending Cardiologist [place], MD  08/13/2014 12:47 PM

## 2014-08-15 NOTE — Telephone Encounter (Signed)
Encounter complete. 

## 2014-08-20 ENCOUNTER — Ambulatory Visit: Payer: Medicare Other | Admitting: Internal Medicine

## 2014-08-21 ENCOUNTER — Encounter: Payer: Self-pay | Admitting: *Deleted

## 2014-08-21 ENCOUNTER — Telehealth: Payer: Self-pay | Admitting: Cardiology

## 2014-08-21 ENCOUNTER — Telehealth: Payer: Self-pay | Admitting: *Deleted

## 2014-08-21 NOTE — Telephone Encounter (Signed)
Pt need clarence for knee surgery faxed to Dr Wynelle Link at [facility]. She also need it faxed to Dr Hermine Messick at [place] [facility] [place].Please do this asap please.

## 2014-08-21 NOTE — Telephone Encounter (Signed)
Ovid Curd,  Please look at the stress test.  I resulted this on 4/28 and cleared the patient.  JC then notes that he sent this to Dr. Maureen Ralphs.  Please clarify that they received this.  If not we need to figure out the barrier so that we don't have to duplicate work that has already been done.  Please let me know the outcome of this investigation.  Thank you.

## 2014-08-21 NOTE — Telephone Encounter (Signed)
Patient requesting that Dr Marin [name] write a letter for medical clearance to Dr Wynelle Link for an upcoming knee surgery. She was very adamant that this letter be delivered today. It was explained that Dr Marin [name] would need more information prior to writing letter. Dr Aluisio's office called and spoke to RN who will fax this office a medical clearance request of Dr Marin [name]. At time of publishing this note, request has still not been received. Updated patient of where we stood in the process and that we'd help as much as possible.

## 2014-08-21 NOTE — Telephone Encounter (Signed)
Fax sent to Dr. Peri Maris office - pt notified via VM.

## 2014-08-21 NOTE — Telephone Encounter (Signed)
Leslie Benton called in wanting to let Ovid Curd know that she did not receive the medical clearance from Dr. Percival Spanish. The medical clearance can be faxed to [phone].   Thanks

## 2014-08-21 NOTE — Telephone Encounter (Signed)
Pt calling r/t getting clearance for knee surgery. She had OV 4/21, & stress test 1 week ago r/t this.  Routing to Dr. Percival Spanish, will prepare letter if OK.

## 2014-08-21 NOTE — Telephone Encounter (Signed)
Called [facility] to confirm they received letter/fax. Left VM for scheduler to call me back.

## 2014-08-23 ENCOUNTER — Ambulatory Visit: Payer: BLUE CROSS/BLUE SHIELD | Admitting: Internal Medicine

## 2014-08-26 ENCOUNTER — Encounter: Payer: Self-pay | Admitting: Nurse Practitioner

## 2014-08-26 ENCOUNTER — Other Ambulatory Visit (INDEPENDENT_AMBULATORY_CARE_PROVIDER_SITE_OTHER): Payer: Medicare Other

## 2014-08-26 ENCOUNTER — Telehealth: Payer: Self-pay | Admitting: Cardiology

## 2014-08-26 DIAGNOSIS — E039 Hypothyroidism, unspecified: Secondary | ICD-10-CM

## 2014-08-26 DIAGNOSIS — D649 Anemia, unspecified: Secondary | ICD-10-CM | POA: Diagnosis not present

## 2014-08-26 LAB — CBC WITH DIFFERENTIAL/PLATELET
BASOS ABS: 0 10*3/uL (ref 0.0–0.1)
Basophils Relative: 0.4 % (ref 0.0–3.0)
EOS ABS: 0.1 10*3/uL (ref 0.0–0.7)
Eosinophils Relative: 1.5 % (ref 0.0–5.0)
HCT: 42.2 % (ref 36.0–46.0)
Hemoglobin: 13.8 g/dL (ref 12.0–15.0)
LYMPHS PCT: 36.7 % (ref 12.0–46.0)
Lymphs Abs: 2.2 10*3/uL (ref 0.7–4.0)
MCHC: 32.6 g/dL (ref 30.0–36.0)
MCV: 80.3 fl (ref 78.0–100.0)
Monocytes Absolute: 0.4 10*3/uL (ref 0.1–1.0)
Monocytes Relative: 6.3 % (ref 3.0–12.0)
NEUTROS PCT: 55.1 % (ref 43.0–77.0)
Neutro Abs: 3.3 10*3/uL (ref 1.4–7.7)
Platelets: 344 10*3/uL (ref 150.0–400.0)
RBC: 5.25 Mil/uL — AB (ref 3.87–5.11)
RDW: 35.2 % — AB (ref 11.5–15.5)
WBC: 6 10*3/uL (ref 4.0–10.5)

## 2014-08-26 LAB — TSH: TSH: 6.93 u[IU]/mL — ABNORMAL HIGH (ref 0.35–4.50)

## 2014-08-26 NOTE — Telephone Encounter (Signed)
Returned call to patient she wanted to know if stress test results sent to Dr.Alusio's office.Leslie Benton results sent to Dr.Alusio's office by Dr.Hochrein's nurse [place] on 08/16/14.

## 2014-08-26 NOTE — Telephone Encounter (Signed)
Pt called in wanting to know if her results from her stress test have been sent over to Dr. Peri Maris office for her clearance. She says that if you are unable to get her, please leave a VM verifying that this has been taken care of.  Thanks

## 2014-08-29 ENCOUNTER — Ambulatory Visit (INDEPENDENT_AMBULATORY_CARE_PROVIDER_SITE_OTHER): Payer: Medicare Other | Admitting: Internal Medicine

## 2014-08-29 ENCOUNTER — Encounter: Payer: Self-pay | Admitting: Internal Medicine

## 2014-08-29 VITALS — BP 150/80 | HR 87 | Temp 98.3°F | Resp 20 | Ht 63.0 in | Wt 212.0 lb

## 2014-08-29 DIAGNOSIS — M150 Primary generalized (osteo)arthritis: Secondary | ICD-10-CM

## 2014-08-29 DIAGNOSIS — I10 Essential (primary) hypertension: Secondary | ICD-10-CM

## 2014-08-29 DIAGNOSIS — E119 Type 2 diabetes mellitus without complications: Secondary | ICD-10-CM | POA: Diagnosis not present

## 2014-08-29 DIAGNOSIS — M159 Polyosteoarthritis, unspecified: Secondary | ICD-10-CM

## 2014-08-29 NOTE — Progress Notes (Signed)
Subjective:    Patient ID: Leslie Benton, female    DOB: April 20, 1940, 74 y.o.   MRN: [id number]  HPI Lab Results  Component Value Date   HGBA1C 7.6* 07/03/2014    74 year old patient who is seen in follow-up.  She is scheduled for knee surgery next month.  She has essential hypertension which has been stable. Preoperative evaluation has included a nuclear stress test as well as a 2-D echocardiogram. She has type 2 diabetes.  Will recently she states blood sugars have been well controlled during the 100 to 1:15 range.  No cardiopulmonary complaints  Past Medical History  Diagnosis Date  . COLONIC POLYPS, HX OF 02/07/2008  . DIABETES MELLITUS, TYPE II 10/27/2009  . GERD 10/14/2006  . HYPERLIPIDEMIA 10/14/2006  . HYPERTENSION 10/14/2006  . HYPOTHYROIDISM 10/14/2006  . LOW BACK PAIN 08/04/2007  . Obesity   . DEGENERATIVE JOINT DISEASE 10/14/2006    03/31/11: Pt states arthritis in her hip.  . Anxiety   . Depression     History   Social History  . Marital Status: Married    Spouse Name: N/A  . Number of Children: 2  . Years of Education: N/A   Occupational History  . Not on file.   Social History Main Topics  . Smoking status: Never Smoker   . Smokeless tobacco: Never Used     Comment: Never Used Tobacco  . Alcohol Use: No  . Drug Use: No  . Sexual Activity: Not on file   Other Topics Concern  . Not on file   Social History Narrative   Lives with husband and son.      Past Surgical History  Procedure Laterality Date  . Hemorrhoid surgery    . Tonsillectomy    . Left knee arthroscopic  April 2014    Dr. Durward Fortes  . Tubal ligation      Family History  Problem Relation Age of Onset  . Heart attack Father 56  . Kidney failure Mother     Allergies  Allergen Reactions  . Niacin And Related Hives and Swelling    No anaphalaxis, but strong reactions.  . Lactose Intolerance (Gi) Diarrhea  . Amoxicillin     REACTION: unspecified  . Penicillins     Unknown       Current Outpatient Prescriptions on File Prior to Visit  Medication Sig Dispense Refill  . busPIRone (BUSPAR) 15 MG tablet Take 1 tablet (15 mg total) by mouth 3 (three) times daily. (Patient taking differently: Take 5-15 mg by mouth 3 (three) times daily as needed (anxiety). ) 180 tablet 3  . glimepiride (AMARYL) 4 MG tablet TAKE 1/2 A TABLET BY MOUTH DAILY WITH BREAKFAST 45 tablet 1  . glucose blood (ACCU-CHEK AVIVA) test strip USE TO CHECK BLOOD SUGAR DAILY AND PRN 100 each 12  . levothyroxine (SYNTHROID, LEVOTHROID) 150 MCG tablet Take 1 tablet (150 mcg total) by mouth daily. 90 tablet 3  . metFORMIN (GLUCOPHAGE) 1000 MG tablet Take 1 tablet (1,000 mg total) by mouth daily with breakfast. (Patient taking differently: Take 1,000 mg by mouth at bedtime. ) 90 tablet 1  . omeprazole (PRILOSEC) 20 MG capsule take 1 capsule by mouth once daily (Patient taking differently: as needed) 90 capsule 1  . propranolol (INDERAL) 20 MG tablet Take 1 tablet (20 mg total) by mouth 2 (two) times daily. (Patient taking differently: Take 20 mg by mouth as needed. Patient takes as needed) 180 tablet 3   No current  facility-administered medications on file prior to visit.    BP 150/80 mmHg  Pulse 87  Temp(Src) 98.3 F (36.8 C) (Oral)  Resp 20  Ht 5\' 3"  (1.6 m)  Wt 212 lb (96.163 kg)  BMI 37.56 kg/m2  SpO2 99%      Review of Systems  Constitutional: Negative.   HENT: Negative for congestion, dental problem, hearing loss, rhinorrhea, sinus pressure, sore throat and tinnitus.   Eyes: Negative for pain, discharge and visual disturbance.  Respiratory: Negative for cough and shortness of breath.   Cardiovascular: Negative for chest pain, palpitations and leg swelling.  Gastrointestinal: Negative for nausea, vomiting, abdominal pain, diarrhea, constipation, blood in stool and abdominal distention.  Genitourinary: Negative for dysuria, urgency, frequency, hematuria, flank pain, vaginal bleeding, vaginal  discharge, difficulty urinating, vaginal pain and pelvic pain.  Musculoskeletal: Positive for arthralgias and gait problem. Negative for joint swelling.  Skin: Negative for rash.  Neurological: Negative for dizziness, syncope, speech difficulty, weakness, numbness and headaches.  Hematological: Negative for adenopathy.  Psychiatric/Behavioral: Negative for behavioral problems, dysphoric mood and agitation. The patient is not nervous/anxious.        Objective:   Physical Exam  Constitutional: She is oriented to person, place, and time. She appears well-developed and well-nourished.  Weight 212 Blood pressure 132/80  HENT:  Head: Normocephalic.  Right Ear: External ear normal.  Left Ear: External ear normal.  Mouth/Throat: Oropharynx is clear and moist.  Eyes: Conjunctivae and EOM are normal. Pupils are equal, round, and reactive to light.  Neck: Normal range of motion. Neck supple. No thyromegaly present.  Cardiovascular: Normal rate, normal heart sounds and intact distal pulses.   Occasional ectopics Grade 2/6 systolic murmur at the base  Pulmonary/Chest: Effort normal and breath sounds normal.  Abdominal: Soft. Bowel sounds are normal. She exhibits no mass. There is no tenderness.  Musculoskeletal: Normal range of motion.  Lymphadenopathy:    She has no cervical adenopathy.  Neurological: She is alert and oriented to person, place, and time.  Skin: Skin is warm and dry. No rash noted.  Psychiatric: She has a normal mood and affect. Her behavior is normal.          Assessment & Plan:   Diabetes mellitus.  No change in therapy at this time.  We'll recheck in 3 months. Essential hypertension Osteoarthritis.  Knee surgery as scheduled

## 2014-08-29 NOTE — Progress Notes (Signed)
Pre visit review using our clinic review tool, if applicable. No additional management support is needed unless otherwise documented below in the visit note. 

## 2014-08-29 NOTE — Patient Instructions (Signed)
Limit your sodium (Salt) intake   Please check your hemoglobin A1c every 3 months   

## 2014-09-02 ENCOUNTER — Other Ambulatory Visit: Payer: BLUE CROSS/BLUE SHIELD | Admitting: Internal Medicine

## 2014-09-02 ENCOUNTER — Telehealth: Payer: Self-pay | Admitting: *Deleted

## 2014-09-02 NOTE — Telephone Encounter (Signed)
Pt called to let Dr. Raliegh Ip know that she had blood in her urine Friday night, denies burning or pain. Pt said this happens sometimes when she is real tired and she was told by Dr. Anne Fu office to let him know but it will not delay her surgery. Told her okay I will let him know.

## 2014-09-02 NOTE — Telephone Encounter (Signed)
Dr. Raliegh Ip please see message.

## 2014-09-09 NOTE — Progress Notes (Signed)
Please put orders in Epic surgery 09-19-14 pre op 09-17-14 Thanks

## 2014-09-11 ENCOUNTER — Ambulatory Visit: Payer: Self-pay | Admitting: Orthopedic Surgery

## 2014-09-11 NOTE — Progress Notes (Signed)
Preoperative surgical orders have been place into the Epic hospital system for Leslie Benton on 09/11/2014, 1:36 PM  by Mickel Crow for surgery on 09-23-2014.  Preop Uni Knee orders including Experal, IV Tylenol, and IV Decadron as long as there are no contraindications to the above medications. Arlee Muslim, PA-C

## 2014-09-12 ENCOUNTER — Other Ambulatory Visit: Payer: Self-pay | Admitting: Internal Medicine

## 2014-09-12 NOTE — Patient Instructions (Addendum)
Leslie Benton  09/12/2014   Your procedure is scheduled on:     09/23/2014    Report to [facility] Main  Entrance and follow signs to               [place] at     [place].  Call this number if you have problems the morning of surgery [phone]   Remember: ONLY 1 PERSON MAY GO WITH YOU TO SHORT STAY TO GET  READY MORNING OF [place].  Do not eat food or drink liquids :After Midnight.   STOP PHENTERMINE NOW, BRING ENVELOPE FROM [place] DAY OF SURGERY, CALL Jereline Ticer ON Thursday 09-19-2014 [phone] IF CANNOT FIND ENVELOPE   Take these medicines the morning of surgery with A SIP OF WATER:   Buspar if needed, Propranolol if needed                               You may not have any metal on your body including hair pins and              piercings  Do not wear jewelry, make-up, lotions, powders or perfumes, deodorant             Do not wear nail polish.  Do not shave  48 hours prior to surgery.  .   Do not bring valuables to the hospital. [place].  Contacts, dentures or bridgework may not be worn into surgery.  Leave suitcase in the car. After surgery it may be brought to your room.    Special Instructions: coughing and deep breathing exercises, leg exercises               Please read over the following fact sheets you were given: _____________________________________________________________________             [facility] - Preparing for Surgery Before surgery, you can play an important role.  Because skin is not sterile, your skin needs to be as free of germs as possible.  You can reduce the number of germs on your skin by washing with CHG (chlorahexidine gluconate) soap before surgery.  CHG is an antiseptic cleaner which kills germs and bonds with the skin to continue killing germs even after washing. Please DO NOT use if you have an allergy to CHG or antibacterial soaps.  If your skin becomes  reddened/irritated stop using the CHG and inform your nurse when you arrive at Short Stay. Do not shave (including legs and underarms) for at least 48 hours prior to the first CHG shower.  You may shave your face/neck. Please follow these instructions carefully:  1.  Shower with CHG Soap the night before surgery and the  morning of Surgery.  2.  If you choose to wash your hair, wash your hair first as usual with your  normal  shampoo.  3.  After you shampoo, rinse your hair and body thoroughly to remove the  shampoo.                           4.  Use CHG as you would any other liquid soap.  You can apply chg directly  to the skin and wash  Gently with a scrungie or clean washcloth.  5.  Apply the CHG Soap to your body ONLY FROM THE NECK DOWN.   Do not use on face/ open                           Wound or open sores. Avoid contact with eyes, ears mouth and genitals (private parts).                       Wash face,  Genitals (private parts) with your normal soap.             6.  Wash thoroughly, paying special attention to the area where your surgery  will be performed.  7.  Thoroughly rinse your body with warm water from the neck down.  8.  DO NOT shower/wash with your normal soap after using and rinsing off  the CHG Soap.                9.  Pat yourself dry with a clean towel.            10.  Wear clean pajamas.            11.  Place clean sheets on your bed the night of your first shower and do not  sleep with pets. Day of Surgery : Do not apply any lotions/deodorants the morning of surgery.  Please wear clean clothes to the hospital/surgery center.  FAILURE TO FOLLOW THESE INSTRUCTIONS MAY RESULT IN THE CANCELLATION OF YOUR SURGERY PATIENT SIGNATURE_________________________________  NURSE SIGNATURE__________________________________  ________________________________________________________________________  WHAT IS A BLOOD TRANSFUSION? Blood Transfusion Information  A  transfusion is the replacement of blood or some of its parts. Blood is made up of multiple cells which provide different functions.  Red blood cells carry oxygen and are used for blood loss replacement.  White blood cells fight against infection.  Platelets control bleeding.  Plasma helps clot blood.  Other blood products are available for specialized needs, such as hemophilia or other clotting disorders. BEFORE THE TRANSFUSION  Who gives blood for transfusions?   Healthy volunteers who are fully evaluated to make sure their blood is safe. This is blood bank blood. Transfusion therapy is the safest it has ever been in the practice of medicine. Before blood is taken from a donor, a complete history is taken to make sure that person has no history of diseases nor engages in risky social behavior (examples are intravenous drug use or sexual activity with multiple partners). The donor's travel history is screened to minimize risk of transmitting infections, such as malaria. The donated blood is tested for signs of infectious diseases, such as HIV and hepatitis. The blood is then tested to be sure it is compatible with you in order to minimize the chance of a transfusion reaction. If you or a relative donates blood, this is often done in anticipation of surgery and is not appropriate for emergency situations. It takes many days to process the donated blood. RISKS AND COMPLICATIONS Although transfusion therapy is very safe and saves many lives, the main dangers of transfusion include:  1. Getting an infectious disease. 2. Developing a transfusion reaction. This is an allergic reaction to something in the blood you were given. Every precaution is taken to prevent this. The decision to have a blood transfusion has been considered carefully by your caregiver before blood is given. Blood is not given unless the benefits outweigh  the risks. AFTER THE TRANSFUSION  Right after receiving a blood  transfusion, you will usually feel much better and more energetic. This is especially true if your red blood cells have gotten low (anemic). The transfusion raises the level of the red blood cells which carry oxygen, and this usually causes an energy increase.  The nurse administering the transfusion will monitor you carefully for complications. HOME CARE INSTRUCTIONS  No special instructions are needed after a transfusion. You may find your energy is better. Speak with your caregiver about any limitations on activity for underlying diseases you may have. SEEK MEDICAL CARE IF:   Your condition is not improving after your transfusion.  You develop redness or irritation at the intravenous (IV) site. SEEK IMMEDIATE MEDICAL CARE IF:  Any of the following symptoms occur over the next 12 hours:  Shaking chills.  You have a temperature by mouth above 102 F (38.9 C), not controlled by medicine.  Chest, back, or muscle pain.  People around you feel you are not acting correctly or are confused.  Shortness of breath or difficulty breathing.  Dizziness and fainting.  You get a rash or develop hives.  You have a decrease in urine output.  Your urine turns a dark color or changes to pink, red, or brown. Any of the following symptoms occur over the next 10 days:  You have a temperature by mouth above 102 F (38.9 C), not controlled by medicine.  Shortness of breath.  Weakness after normal activity.  The white part of the eye turns yellow (jaundice).  You have a decrease in the amount of urine or are urinating less often.  Your urine turns a dark color or changes to pink, red, or brown. Document Released: 04/02/2000 Document Revised: 06/28/2011 Document Reviewed: 11/20/2007 ExitCare Patient Information 2014 [place].  _______________________________________________________________________  Incentive Spirometer  An incentive spirometer is a tool that can help keep your  lungs clear and active. This tool measures how well you are filling your lungs with each breath. Taking long deep breaths may help reverse or decrease the chance of developing breathing (pulmonary) problems (especially infection) following:  A long period of time when you are unable to move or be active. BEFORE THE PROCEDURE   If the spirometer includes an indicator to show your best effort, your nurse or respiratory therapist will set it to a desired goal.  If possible, sit up straight or lean slightly forward. Try not to slouch.  Hold the incentive spirometer in an upright position. INSTRUCTIONS FOR USE  3. Sit on the edge of your bed if possible, or sit up as far as you can in bed or on a chair. 4. Hold the incentive spirometer in an upright position. 5. Breathe out normally. 6. Place the mouthpiece in your mouth and seal your lips tightly around it. 7. Breathe in slowly and as deeply as possible, raising the piston or the ball toward the top of the column. 8. Hold your breath for 3-5 seconds or for as long as possible. Allow the piston or ball to fall to the bottom of the column. 9. Remove the mouthpiece from your mouth and breathe out normally. 10. Rest for a few seconds and repeat Steps 1 through 7 at least 10 times every 1-2 hours when you are awake. Take your time and take a few normal breaths between deep breaths. 11. The spirometer may include an indicator to show your best effort. Use the indicator as a goal to work  toward during each repetition. 12. After each set of 10 deep breaths, practice coughing to be sure your lungs are clear. If you have an incision (the cut made at the time of surgery), support your incision when coughing by placing a pillow or rolled up towels firmly against it. Once you are able to get out of bed, walk around indoors and cough well. You may stop using the incentive spirometer when instructed by your caregiver.  RISKS AND COMPLICATIONS  Take your time so  you do not get dizzy or light-headed.  If you are in pain, you may need to take or ask for pain medication before doing incentive spirometry. It is harder to take a deep breath if you are having pain. AFTER USE  Rest and breathe slowly and easily.  It can be helpful to keep track of a log of your progress. Your caregiver can provide you with a simple table to help with this. If you are using the spirometer at home, follow these instructions: [place] IF:   You are having difficultly using the spirometer.  You have trouble using the spirometer as often as instructed.  Your pain medication is not giving enough relief while using the spirometer.  You develop fever of 100.5 F (38.1 C) or higher. SEEK IMMEDIATE MEDICAL CARE IF:   You cough up bloody sputum that had not been present before.  You develop fever of 102 F (38.9 C) or greater.  You develop worsening pain at or near the incision site. MAKE SURE YOU:   Understand these instructions.  Will watch your condition.  Will get help right away if you are not doing well or get worse. Document Released: 08/16/2006 Document Revised: 06/28/2011 Document Reviewed: 10/17/2006 [facility] Patient Information 2014 [place], [place].   ________________________________________________________________________

## 2014-09-17 ENCOUNTER — Encounter (HOSPITAL_COMMUNITY)
Admission: RE | Admit: 2014-09-17 | Discharge: 2014-09-17 | Disposition: A | Discharge status: Home / Self Care | Payer: Medicare Other | Source: Ambulatory Visit | Attending: Orthopedic Surgery | Admitting: Orthopedic Surgery

## 2014-09-17 ENCOUNTER — Encounter (HOSPITAL_COMMUNITY): Payer: Self-pay

## 2014-09-17 DIAGNOSIS — M179 Osteoarthritis of knee, unspecified: Secondary | ICD-10-CM | POA: Insufficient documentation

## 2014-09-17 DIAGNOSIS — Z01818 Encounter for other preprocedural examination: Secondary | ICD-10-CM | POA: Insufficient documentation

## 2014-09-17 HISTORY — DX: Anemia, unspecified: D64.9

## 2014-09-17 LAB — PROTIME-INR
INR: 0.94 (ref 0.00–1.49)
Prothrombin Time: 12.8 seconds (ref 11.6–15.2)

## 2014-09-17 LAB — COMPREHENSIVE METABOLIC PANEL
ALT: 21 U/L (ref 14–54)
ANION GAP: 12 (ref 5–15)
AST: 19 U/L (ref 15–41)
Albumin: 4.4 g/dL (ref 3.5–5.0)
Alkaline Phosphatase: 86 U/L (ref 38–126)
BUN: 21 mg/dL — ABNORMAL HIGH (ref 6–20)
CALCIUM: 9.2 mg/dL (ref 8.9–10.3)
CO2: 25 mmol/L (ref 22–32)
CREATININE: 1.21 mg/dL — AB (ref 0.44–1.00)
Chloride: 104 mmol/L (ref 101–111)
GFR calc Af Amer: 49 mL/min — ABNORMAL LOW (ref >60)
GFR calc non Af Amer: 43 mL/min — ABNORMAL LOW (ref >60)
GLUCOSE: 153 mg/dL — AB (ref 65–99)
Potassium: 4.1 mmol/L (ref 3.5–5.1)
Sodium: 141 mmol/L (ref 135–145)
Total Bilirubin: 0.4 mg/dL (ref 0.3–1.2)
Total Protein: 7.5 g/dL (ref 6.5–8.1)

## 2014-09-17 LAB — CBC
HCT: 45.7 % (ref 36.0–46.0)
Hemoglobin: 15.3 g/dL — ABNORMAL HIGH (ref 12.0–15.0)
MCH: 28.6 pg (ref 26.0–34.0)
MCHC: 33.5 g/dL (ref 30.0–36.0)
MCV: 85.4 fL (ref 78.0–100.0)
Platelets: 270 10*3/uL (ref 150–400)
RBC: 5.35 MIL/uL — AB (ref 3.87–5.11)
RDW: 23.7 % — ABNORMAL HIGH (ref 11.5–15.5)
WBC: 6.3 10*3/uL (ref 4.0–10.5)

## 2014-09-17 LAB — URINALYSIS, ROUTINE W REFLEX MICROSCOPIC
BILIRUBIN URINE: NEGATIVE
GLUCOSE, UA: NEGATIVE mg/dL
Hgb urine dipstick: NEGATIVE
KETONES UR: NEGATIVE mg/dL
Leukocytes, UA: NEGATIVE
Nitrite: NEGATIVE
Protein, ur: NEGATIVE mg/dL
Specific Gravity, Urine: 1.025 (ref 1.005–1.030)
Urobilinogen, UA: 0.2 mg/dL (ref 0.0–1.0)
pH: 5.5 (ref 5.0–8.0)

## 2014-09-17 LAB — APTT: APTT: 31 s (ref 24–37)

## 2014-09-17 LAB — SURGICAL PCR SCREEN
MRSA, PCR: NEGATIVE
STAPHYLOCOCCUS AUREUS: POSITIVE — AB

## 2014-09-17 NOTE — Progress Notes (Signed)
Cardiac clearance telephone note 08-21-14 dr hochrein epic lov dr hochrein 08-08-14 epic ekg 07-03-14 epic Echo 07-04-14 epic Stress tets 08-13-14 epic Chest xray 09-29-13 epic Hematology clearnce note 08-26-14 dr Marin [name] on chart faxed and in epic Medical clearance note dr Burnice [name] on chart for 09-23-14 surgery Pt has right 4 th nail draining clear fluid, pt advised to call dr Burnice [name] and have nail evaluated asap, pt called and has appointment with dr [name] 09-18-2014 to have right 4 th nail evaluated.

## 2014-09-18 ENCOUNTER — Ambulatory Visit (INDEPENDENT_AMBULATORY_CARE_PROVIDER_SITE_OTHER): Payer: Medicare Other | Admitting: Adult Health

## 2014-09-18 ENCOUNTER — Encounter: Payer: Self-pay | Admitting: Adult Health

## 2014-09-18 VITALS — BP 130/80 | Temp 97.8°F | Ht 63.5 in | Wt 208.7 lb

## 2014-09-18 DIAGNOSIS — L02511 Cutaneous abscess of right hand: Secondary | ICD-10-CM

## 2014-09-18 DIAGNOSIS — T148 Other injury of unspecified body region: Secondary | ICD-10-CM | POA: Diagnosis not present

## 2014-09-18 DIAGNOSIS — W57XXXA Bitten or stung by nonvenomous insect and other nonvenomous arthropods, initial encounter: Secondary | ICD-10-CM

## 2014-09-18 MED ORDER — METHYLPREDNISOLONE ACETATE 80 MG/ML IJ SUSP
80.0000 mg | Freq: Once | INTRAMUSCULAR | Status: AC
  Administered 2014-09-18: 80 mg via INTRAMUSCULAR

## 2014-09-18 MED ORDER — CLINDAMYCIN HCL 300 MG PO CAPS: 300.0000 mg | ORAL_CAPSULE | Freq: Three times a day (TID) | ORAL | Status: DC

## 2014-09-18 NOTE — Progress Notes (Signed)
Subjective:    Patient ID: Leslie Benton, female    DOB: [birth year], 74 y.o.   MRN: [id number]  HPI Leslie Benton presents to the office today for two separate problems.   1) She is concerned about an area of pain and infection to the right pointer finger after having her nails done. The pain and redness started " a few days ago". She has not noticed any drainage. Has not tried anything for the pain  2) Her left upper arm is red and itchy. She was working in the yard yesterday and believes that she was bit by a bug.    Review of Systems  Constitutional: Negative for fever and chills.  Musculoskeletal: Negative for joint swelling.  Skin: Positive for rash and wound.  All other systems reviewed and are negative.  Past Medical History  Diagnosis Date  . COLONIC POLYPS, HX OF 02/07/2008  . DIABETES MELLITUS, TYPE II 10/27/2009  . GERD 10/14/2006  . HYPERLIPIDEMIA 10/14/2006  . HYPOTHYROIDISM 10/14/2006  . LOW BACK PAIN 08/04/2007  . Obesity   . DEGENERATIVE JOINT DISEASE 10/14/2006    03/31/11: Pt states arthritis in her hip.  . Anxiety   . Depression   . HYPERTENSION 10/14/2006    off bp meds now  . Anemia     History   Social History  . Marital Status: Married    Spouse Name: N/A  . Number of Children: 2  . Years of Education: N/A   Occupational History  . Not on file.   Social History Main Topics  . Smoking status: Never Smoker   . Smokeless tobacco: Never Used     Comment: Never Used Tobacco  . Alcohol Use: No  . Drug Use: No  . Sexual Activity: Not on file   Other Topics Concern  . Not on file   Social History Narrative   Lives with husband and son.      Past Surgical History  Procedure Laterality Date  . Hemorrhoid surgery    . Tonsillectomy    . Left knee arthroscopic  April 2014    Dr. Durward Fortes  . Tubal ligation      Family History  Problem Relation Age of Onset  . Heart attack Father 76  . Kidney failure Mother     Allergies  Allergen  Reactions  . Niacin And Related Hives and Swelling    No anaphalaxis, but strong reactions.  . Lactose Intolerance (Gi)     "bad taste in mouth"  . Amoxicillin Swelling    hives  . Penicillins Swelling    Hives With all cillins    Current Outpatient Prescriptions on File Prior to Visit  Medication Sig Dispense Refill  . acetaminophen (TYLENOL) 500 MG tablet Take 500 mg by mouth every 6 (six) hours as needed for moderate pain or headache.    . busPIRone (BUSPAR) 15 MG tablet Take 1 tablet (15 mg total) by mouth 3 (three) times daily. (Patient taking differently: Take 5-15 mg by mouth 3 (three) times daily as needed (anxiety). ) 180 tablet 3  . glimepiride (AMARYL) 4 MG tablet TAKE 1/2 A TABLET BY MOUTH DAILY WITH BREAKFAST 45 tablet 1  . glucose blood (ACCU-CHEK AVIVA) test strip USE TO CHECK BLOOD SUGAR DAILY AND PRN 100 each 12  . levothyroxine (SYNTHROID, LEVOTHROID) 150 MCG tablet Take 1 tablet (150 mcg total) by mouth daily. 90 tablet 3  . metFORMIN (GLUCOPHAGE) 1000 MG tablet Take 1 tablet (1,000 mg total)  by mouth daily with breakfast. (Patient taking differently: Take 1,000 mg by mouth every morning. ) 90 tablet 1  . omeprazole (PRILOSEC) 20 MG capsule take 1 capsule by mouth once daily (Patient taking differently: Take one capsule by mouth every morning.) 90 capsule 1  . phentermine 37.5 MG capsule take 1 capsule by mouth every morning 90 capsule 1  . PHENTERMINE HCL PO Take 1 capsule by mouth daily as needed (late meals.).    Marland Kitchen propranolol (INDERAL) 20 MG tablet Take 1 tablet (20 mg total) by mouth 2 (two) times daily. (Patient taking differently: Take 20 mg by mouth 2 (two) times daily as needed (palpitations.). Patient takes as needed) 180 tablet 3  . sodium chloride (MURO 128) 2 % ophthalmic solution Place 1-2 drops into both eyes daily as needed (dry eye).     No current facility-administered medications on file prior to visit.    BP 130/80 mmHg  Temp(Src) 97.8 F (36.6 C)  (Oral)  Ht 5' 3.5" (1.613 m)  Wt 208 lb 11.2 oz (94.666 kg)  BMI 36.39 kg/m2       Objective:   Physical Exam  Constitutional: She is oriented to person, place, and time. She appears well-developed and well-nourished. No distress.  Cardiovascular: Normal rate, regular rhythm and normal heart sounds.   Pulmonary/Chest: Effort normal and breath sounds normal. No respiratory distress. She has no wheezes.  Neurological: She is alert and oriented to person, place, and time.  Skin: Skin is warm and dry. Rash noted. She is not diaphoretic. There is erythema. No pallor.  Small paronychia on medial aspect of right pointer finger nail. Slightly red and inflamed. Painful to touch.    Has large, red, itchy rash on left upper arm. Bite mark noted on underside of arm. No tick noted in wound  Psychiatric: She has a normal mood and affect. Her behavior is normal. Thought content normal.  Vitals reviewed.       Assessment & Plan:  1. Felon, right Risks, benefits, and alternatives explained and consent obtained. Time out conducted. Surface cleaned with alcohol.  1cc lidocaine without epinephine infiltrated around abscess. Adequate anesthesia ensured. Area prepped and draped in a sterile fashion. #11 blade used to make a slice incision into abscess. minimal pus expressed with pressure. Hemostasis achieved. Antibiotic ointment and bandaid applied to site Pt stable. Aftercare and follow-up advised. - clindamycin (CLEOCIN) 300 MG capsule; Take 1 capsule (300 mg total) by mouth 3 (three) times daily.  Dispense: 30 capsule; Refill: 0 - Follow up in 2-3 days if no improvement. Sooner if S&S of infection 2. Bug bite - methylPREDNISolone acetate (DEPO-MEDROL) injection 80 mg; Inject 1 mL (80 mg total) into the muscle once. - OTC benadryl cream for itching

## 2014-09-18 NOTE — Progress Notes (Signed)
Surgical screening in epic per PAT visit 09/17/2014 positive for Staph - results sent to Dr Wynelle Link. Pt notified. Pt states has unexpired Mupiocin Ointment at home - had pt check expiration date on tube to discard 06/2015. Reviewed instruction for use with pt. Pt verbalized understanding. Pt stated she was bitten by mosquio last evening and has reddened area with itching. This nurse instructed if any issues with area will need to address with primary MD.

## 2014-09-18 NOTE — Patient Instructions (Signed)
I have sent a prescription to the pharmacy for Clindamycin, please take this three times a day for 10 days. Be mindful of signs and symptoms of infection. You can keep the wound covered with a band aid and antibiotic ointment.   Also use benadryl cream for your arm.   If you have no improvement in your finger or arm in 2-3 days, please let me know.

## 2014-09-18 NOTE — Progress Notes (Signed)
Pre visit review using our clinic review tool, if applicable. No additional management support is needed unless otherwise documented below in the visit note. 

## 2014-09-19 ENCOUNTER — Encounter: Payer: Self-pay | Admitting: Adult Health

## 2014-09-19 ENCOUNTER — Ambulatory Visit (INDEPENDENT_AMBULATORY_CARE_PROVIDER_SITE_OTHER): Payer: Medicare Other | Admitting: Adult Health

## 2014-09-19 VITALS — BP 138/70 | Temp 97.7°F | Ht 63.5 in | Wt 208.0 lb

## 2014-09-19 DIAGNOSIS — Z09 Encounter for follow-up examination after completed treatment for conditions other than malignant neoplasm: Secondary | ICD-10-CM

## 2014-09-19 NOTE — Progress Notes (Signed)
Subjective:    Patient ID: Leslie Benton, female    DOB: 06-18-1940, 74 y.o.   MRN: [id number]  HPI  Patient presents to the office today for follow up after I&D of paranychia of right hand. She continues to have pain, swelling and warmth at site of incision. Pain is decreasing. She did start her antibiotics yesterday.   Review of Systems  Constitutional: Negative.   HENT: Negative.   Skin: Positive for rash and wound.       erythemia to site of incision  All other systems reviewed and are negative.  Past Medical History  Diagnosis Date  . COLONIC POLYPS, HX OF 02/07/2008  . DIABETES MELLITUS, TYPE II 10/27/2009  . GERD 10/14/2006  . HYPERLIPIDEMIA 10/14/2006  . HYPOTHYROIDISM 10/14/2006  . LOW BACK PAIN 08/04/2007  . Obesity   . DEGENERATIVE JOINT DISEASE 10/14/2006    03/31/11: Pt states arthritis in her hip.  . Anxiety   . Depression   . HYPERTENSION 10/14/2006    off bp meds now  . Anemia     History   Social History  . Marital Status: Married    Spouse Name: N/A  . Number of Children: 2  . Years of Education: N/A   Occupational History  . Not on file.   Social History Main Topics  . Smoking status: Never Smoker   . Smokeless tobacco: Never Used     Comment: Never Used Tobacco  . Alcohol Use: No  . Drug Use: No  . Sexual Activity: Not on file   Other Topics Concern  . Not on file   Social History Narrative   Lives with husband and son.      Past Surgical History  Procedure Laterality Date  . Hemorrhoid surgery    . Tonsillectomy    . Left knee arthroscopic  April 2014    Dr. Durward Fortes  . Tubal ligation      Family History  Problem Relation Age of Onset  . Heart attack Father 55  . Kidney failure Mother     Allergies  Allergen Reactions  . Niacin And Related Hives and Swelling    No anaphalaxis, but strong reactions.  . Lactose Intolerance (Gi)     "bad taste in mouth"  . Amoxicillin Swelling    hives  . Penicillins Swelling     Hives With all cillins    Current Outpatient Prescriptions on File Prior to Visit  Medication Sig Dispense Refill  . acetaminophen (TYLENOL) 500 MG tablet Take 500 mg by mouth every 6 (six) hours as needed for moderate pain or headache.    . busPIRone (BUSPAR) 15 MG tablet Take 1 tablet (15 mg total) by mouth 3 (three) times daily. (Patient taking differently: Take 5-15 mg by mouth 3 (three) times daily as needed (anxiety). ) 180 tablet 3  . clindamycin (CLEOCIN) 300 MG capsule Take 1 capsule (300 mg total) by mouth 3 (three) times daily. 30 capsule 0  . glimepiride (AMARYL) 4 MG tablet TAKE 1/2 A TABLET BY MOUTH DAILY WITH BREAKFAST 45 tablet 1  . glucose blood (ACCU-CHEK AVIVA) test strip USE TO CHECK BLOOD SUGAR DAILY AND PRN 100 each 12  . levothyroxine (SYNTHROID, LEVOTHROID) 150 MCG tablet Take 1 tablet (150 mcg total) by mouth daily. 90 tablet 3  . metFORMIN (GLUCOPHAGE) 1000 MG tablet Take 1 tablet (1,000 mg total) by mouth daily with breakfast. (Patient taking differently: Take 1,000 mg by mouth every morning. ) 90 tablet  1  . omeprazole (PRILOSEC) 20 MG capsule take 1 capsule by mouth once daily (Patient taking differently: Take one capsule by mouth every morning.) 90 capsule 1  . phentermine 37.5 MG capsule take 1 capsule by mouth every morning 90 capsule 1  . PHENTERMINE HCL PO Take 1 capsule by mouth daily as needed (late meals.).    Marland Kitchen propranolol (INDERAL) 20 MG tablet Take 1 tablet (20 mg total) by mouth 2 (two) times daily. (Patient taking differently: Take 20 mg by mouth 2 (two) times daily as needed (palpitations.). Patient takes as needed) 180 tablet 3  . sodium chloride (MURO 128) 2 % ophthalmic solution Place 1-2 drops into both eyes daily as needed (dry eye).    Marland Kitchen UNABLE TO FIND Benadryl es cream applying to left upper arm insect bite 4 times per day     No current facility-administered medications on file prior to visit.    BP 138/70 mmHg  Temp(Src) 97.7 F (36.5 C)  (Oral)  Ht 5' 3.5" (1.613 m)  Wt 208 lb (94.348 kg)  BMI 36.26 kg/m2        Objective:   Physical Exam  Constitutional: She is oriented to person, place, and time. She appears well-developed and well-nourished.  Musculoskeletal: She exhibits edema and tenderness.  Neurological: She is alert and oriented to person, place, and time.  Skin: Skin is warm and dry. She is not diaphoretic. There is erythema.  Psychiatric: She has a normal mood and affect. Her behavior is normal. Thought content normal.  Nursing note and vitals reviewed.      Assessment & Plan:   1. Follow up - Continue with ABX therapy.  - Neosporin to finger and band aid - She would like to follow up tomorrow at 3:15

## 2014-09-19 NOTE — Progress Notes (Signed)
spoke with pt by phone patient states having appointment this afternoon to follow up on left 4th fingernail drainage. Patient also states cannot find envelope given by drew perkins pa. Nurse stated will notify office patient cannot find h and p envelope given by drew.

## 2014-09-19 NOTE — Progress Notes (Signed)
Pre visit review using our clinic review tool, if applicable. No additional management support is needed unless otherwise documented below in the visit note. 

## 2014-09-19 NOTE — Progress Notes (Signed)
Left mesage for Leslie Benton pt has misplaced envelope drew perkins pa gave her to bring to pre op.Also made Leslie aware pt seeing dr for torn finger nail left 4th and pt on antibiotics x 10 days and pt has insect bite left upper arm using cream for.

## 2014-09-20 ENCOUNTER — Ambulatory Visit (INDEPENDENT_AMBULATORY_CARE_PROVIDER_SITE_OTHER): Payer: Self-pay | Admitting: Adult Health

## 2014-09-20 ENCOUNTER — Encounter: Payer: Self-pay | Admitting: Adult Health

## 2014-09-20 ENCOUNTER — Other Ambulatory Visit: Payer: Self-pay | Admitting: *Deleted

## 2014-09-20 VITALS — BP 160/84 | Temp 98.1°F | Ht 63.5 in | Wt 208.0 lb

## 2014-09-20 DIAGNOSIS — Z5189 Encounter for other specified aftercare: Secondary | ICD-10-CM

## 2014-09-20 MED ORDER — GLUCOSE BLOOD VI STRP: ORAL_STRIP | Status: DC

## 2014-09-20 NOTE — Progress Notes (Signed)
Pre visit review using our clinic review tool, if applicable. No additional management support is needed unless otherwise documented below in the visit note. 

## 2014-09-20 NOTE — Progress Notes (Signed)
   Subjective:    Patient ID: Leslie Benton, female    DOB: 03/02/1941, 74 y.o.   MRN: [id number]  HPI  Leslie Benton presents back to the office to have one last wound check on her right finger. She had an I&D of a paronychia on 09/18/2014. She is currently taking Clindamycin 300 mg TID   Review of Systems  Skin: Positive for rash and wound.  All other systems reviewed and are negative.      Objective:   Physical Exam  Area around finger nail appears to be responding to antibiotic therapy well. She has much less redness and warmth than during yesterdays evaluation. She is still slightly tender to touch.     Assessment & Plan:  - Continue on ABX therapy as previously directed - Vergia Alberts, CMA spoke with Abigail Butts at Dr. Peri Maris office and informed her of what was going on and the plan.

## 2014-09-22 ENCOUNTER — Ambulatory Visit: Payer: Self-pay | Admitting: Orthopedic Surgery

## 2014-09-22 NOTE — H&P (Signed)
Leslie Benton DOB: [birth year] Married / Language: English / Race: White Female Date of Admission:  09/23/2014 CC:  Left Knee Pain History of Present Illness The patient is a 74 year old female who comes in for a preoperative History and Physical. The patient is scheduled for a left knee unicompartmental replacement to be performed by Dr. Dione Plover. Aluisio, MD at [facility] on 09-23-2014. The patient is a 74 year old female who presents today for follow up of their knee. The patient is being followed for their left knee pain and osteoarthritis. They are now several month(s) out from a cortisone injection. Symptoms reported today include: pain, aching, stiffness, instability, pain with weightbearing, difficulty ambulating and difficulty arising from chair. The patient feels that they are doing poorly and report their pain level to be moderate to severe. Current treatment includes: modified weightbearing, relative rest and activity modification. The patient has not gotten any relief of their symptoms with Cortisone injections. Note for "Follow-up Knee": She went to a place in [facility], at the recommendation of her husband. She had a "cold lazor" treatment. She said it made the knee worse. She said that the knee is definitely limiting what she can and cannot do. She feels out of balance because of the knee. She feels like the pain in the knee is making her walk worse and is affecting other body parts. She has had injections in the past without benefit. She has tried multiple other modalities without benefit. She is wondering whether or not surgical treatment is in order at this time. The pain is all along the medial aspect of her knee. They have been treated conservatively in the past for the above stated problem and despite conservative measures, they continue to have progressive pain and severe functional limitations and dysfunction. They have failed non-operative management including home  exercise, medications, and injections. It is felt that they would benefit from undergoing partial unicompartmental replacement. Risks and benefits of the procedure have been discussed with the patient and they elect to proceed with surgery. There are no active contraindications to surgery such as ongoing infection or rapidly progressive neurological disease.  Problem List/Past Medical  Primary osteoarthritis of left knee (M17.12) Lumbago (M54.5)01/18/2003 Diabetes Mellitus, Type II High blood pressure Hypercholesterolemia Osteoarthritis Anemia  Allergies Penicillin VK *PENICILLINS* Hives. Amoxicillin *PENICILLINS* Hives.  Family History  Father Pancreatic Cancer Siblings Sister - Endometrial Cancer  Social History Pain Contract no Current work status disabled Tobacco use never smoker Illicit drug use no Marital status married Children 2 Drug/Alcohol Rehab (Previously) no Drug/Alcohol Rehab (Currently) no Exercise Exercises daily; does gym / weights Living situation live with spouse Post-Surgical Plans Home versus Rehab  Medication History  Omeprazole (20MG  Capsule DR, Oral) Active. (only on occasion) BusPIRone HCl (15MG  Tablet, Oral) Active. (only as needed) Glimepiride (4MG  Tablet, Oral) Active. Levothyroxine Sodium (125MCG Tablet, Oral) Active. Phentermine HCl (37.5MG  Capsule, Oral) Active. Propranolol HCl (20MG  Tablet, Oral) Active. (as needed/rare occasion)  Past Surgical History  Tubal Ligation Arthroscopy of Knee left Tonsillectomy Hemorrhoidectomy   Review of Systems General Present- Fatigue. Not Present- Chills, Fever, Memory Loss, Night Sweats, Weight Gain and Weight Loss. Skin Not Present- Eczema, Hives, Itching, Lesions and Rash. HEENT Not Present- Dentures, Double Vision, Headache, Hearing Loss, Tinnitus and Visual Loss. Respiratory Not Present- Allergies, Chronic Cough, Coughing up blood, Shortness of breath at rest and  Shortness of breath with exertion. Cardiovascular Not Present- Chest Pain, Difficulty Breathing Lying Down, Murmur, Palpitations, Racing/skipping heartbeats  and Swelling. Gastrointestinal Not Present- Abdominal Pain, Bloody Stool, Constipation, Diarrhea, Difficulty Swallowing, Heartburn, Jaundice, Loss of appetitie, Nausea and Vomiting. Female Genitourinary Present- Urinating at Night. Not Present- Blood in Urine, Discharge, Flank Pain, Incontinence, Painful Urination, Urgency, Urinary frequency, Urinary Retention and Weak urinary stream. Musculoskeletal Present- Joint Pain (medial joint line pain) and Morning Stiffness. Not Present- Back Pain, Joint Swelling, Muscle Pain, Muscle Weakness and Spasms. Neurological Not Present- Blackout spells, Difficulty with balance, Dizziness, Paralysis, Tremor and Weakness. Psychiatric Not Present- Insomnia.  Vitals  Weight: 200 lb Height: 63in Body Surface Area: 1.93 m Body Mass Index: 35.43 kg/m  BP: 146/82 (Sitting, Left Arm, Standard)  Physical Exam General Mental Status -Alert, cooperative and good historian. General Appearance-pleasant, Not in acute distress. Orientation-Oriented X3. Build & Nutrition-Well nourished and Well developed.  Head and Neck Head-normocephalic, atraumatic . Neck Global Assessment - supple, no bruit auscultated on the right, no bruit auscultated on the left.  Eye Vision-Wears corrective lenses(wears readers). Pupil - Bilateral-Regular and Round. Motion - Bilateral-EOMI.  Chest and Lung Exam Auscultation Breath sounds - clear at anterior chest wall and clear at posterior chest wall. Adventitious sounds - No Adventitious sounds.  Cardiovascular Auscultation Rhythm - Regular rate and rhythm. Heart Sounds - S1 WNL and S2 WNL. Murmurs & Other Heart Sounds: Murmur 1 - Location - Aortic Area. Timing - Mid-systolic. Grade - III/VI. Character - Low pitched.  Abdomen Inspection Contour -  Generalized mild distention. Palpation/Percussion Tenderness - Abdomen is non-tender to palpation. Rigidity (guarding) - Abdomen is soft. Auscultation Auscultation of the abdomen reveals - Bowel sounds normal.  Female Genitourinary Note: Not done, not pertinent to present illness   Musculoskeletal Note: On examination, she is alert and oriented, in no apparent distress. Her left knee shows no effusion. Her range is zero to 125. She is very tender medially. There is no other tenderness noted. There is no instability noted.  RADIOGRAPHS: Her radiographs are reviewed and she has bone-on-bone arthritis in the medial compartment but no lateral or patellofemoral involvement.   Assessment & Plan Primary osteoarthritis of left knee (M17.12)  Note:Surgical Plans: Left Unicompartmental Knee Replacement  Disposition: Home versus Rehab  PCP: Dr. Raliegh Ip  IV TXA  Anesthesia Issues: None  Signed electronically by Joelene Millin, III PA-C

## 2014-09-23 ENCOUNTER — Ambulatory Visit (HOSPITAL_COMMUNITY): Payer: Medicare Other | Admitting: Certified Registered"

## 2014-09-23 ENCOUNTER — Encounter (HOSPITAL_COMMUNITY)
Admission: RE | Disposition: A | Discharge status: Skilled Nursing Facility | Payer: Self-pay | Source: Ambulatory Visit | Attending: Orthopedic Surgery

## 2014-09-23 ENCOUNTER — Telehealth: Payer: Self-pay | Admitting: *Deleted

## 2014-09-23 ENCOUNTER — Observation Stay (HOSPITAL_COMMUNITY)
Admission: RE | Admit: 2014-09-23 | Discharge: 2014-09-25 | Disposition: A | Discharge status: Skilled Nursing Facility | Payer: Medicare Other | Source: Ambulatory Visit | Attending: Orthopedic Surgery | Admitting: Orthopedic Surgery

## 2014-09-23 ENCOUNTER — Encounter (HOSPITAL_COMMUNITY): Payer: Self-pay | Admitting: *Deleted

## 2014-09-23 ENCOUNTER — Other Ambulatory Visit: Payer: BLUE CROSS/BLUE SHIELD | Admitting: *Deleted

## 2014-09-23 DIAGNOSIS — M25562 Pain in left knee: Secondary | ICD-10-CM | POA: Diagnosis present

## 2014-09-23 DIAGNOSIS — Z6836 Body mass index (BMI) 36.0-36.9, adult: Secondary | ICD-10-CM | POA: Insufficient documentation

## 2014-09-23 DIAGNOSIS — E669 Obesity, unspecified: Secondary | ICD-10-CM | POA: Diagnosis not present

## 2014-09-23 DIAGNOSIS — Z880 Allergy status to penicillin: Secondary | ICD-10-CM | POA: Insufficient documentation

## 2014-09-23 DIAGNOSIS — E119 Type 2 diabetes mellitus without complications: Secondary | ICD-10-CM | POA: Insufficient documentation

## 2014-09-23 DIAGNOSIS — M1712 Unilateral primary osteoarthritis, left knee: Principal | ICD-10-CM | POA: Insufficient documentation

## 2014-09-23 DIAGNOSIS — M179 Osteoarthritis of knee, unspecified: Secondary | ICD-10-CM | POA: Diagnosis present

## 2014-09-23 DIAGNOSIS — E039 Hypothyroidism, unspecified: Secondary | ICD-10-CM | POA: Insufficient documentation

## 2014-09-23 DIAGNOSIS — E780 Pure hypercholesterolemia: Secondary | ICD-10-CM | POA: Insufficient documentation

## 2014-09-23 DIAGNOSIS — F329 Major depressive disorder, single episode, unspecified: Secondary | ICD-10-CM | POA: Diagnosis not present

## 2014-09-23 DIAGNOSIS — M1610 Unilateral primary osteoarthritis, unspecified hip: Secondary | ICD-10-CM | POA: Insufficient documentation

## 2014-09-23 DIAGNOSIS — F419 Anxiety disorder, unspecified: Secondary | ICD-10-CM | POA: Diagnosis not present

## 2014-09-23 DIAGNOSIS — Z881 Allergy status to other antibiotic agents status: Secondary | ICD-10-CM | POA: Insufficient documentation

## 2014-09-23 DIAGNOSIS — K219 Gastro-esophageal reflux disease without esophagitis: Secondary | ICD-10-CM | POA: Diagnosis not present

## 2014-09-23 DIAGNOSIS — Z86010 Personal history of colonic polyps: Secondary | ICD-10-CM | POA: Insufficient documentation

## 2014-09-23 DIAGNOSIS — Z9851 Tubal ligation status: Secondary | ICD-10-CM | POA: Diagnosis not present

## 2014-09-23 DIAGNOSIS — Z79899 Other long term (current) drug therapy: Secondary | ICD-10-CM | POA: Insufficient documentation

## 2014-09-23 DIAGNOSIS — I10 Essential (primary) hypertension: Secondary | ICD-10-CM | POA: Insufficient documentation

## 2014-09-23 DIAGNOSIS — M1710 Unilateral primary osteoarthritis, unspecified knee: Secondary | ICD-10-CM | POA: Diagnosis present

## 2014-09-23 DIAGNOSIS — E785 Hyperlipidemia, unspecified: Secondary | ICD-10-CM | POA: Insufficient documentation

## 2014-09-23 HISTORY — PX: PARTIAL KNEE ARTHROPLASTY: SHX2174

## 2014-09-23 LAB — GLUCOSE, CAPILLARY
GLUCOSE-CAPILLARY: 119 mg/dL — AB (ref 65–99)
Glucose-Capillary: 155 mg/dL — ABNORMAL HIGH (ref 65–99)

## 2014-09-23 LAB — TYPE AND SCREEN
ABO/RH(D): O POS
Antibody Screen: NEGATIVE

## 2014-09-23 SURGERY — ARTHROPLASTY, KNEE, UNICOMPARTMENTAL
Anesthesia: Spinal | Site: Knee | Laterality: Left

## 2014-09-23 MED ORDER — LEVOTHYROXINE SODIUM 150 MCG PO TABS
150.0000 ug | ORAL_TABLET | Freq: Every day | ORAL | Status: DC
  Administered 2014-09-24 – 2014-09-25 (×2): 150 ug via ORAL
  Filled 2014-09-23 (×3): qty 1

## 2014-09-23 MED ORDER — DEXAMETHASONE SODIUM PHOSPHATE 10 MG/ML IJ SOLN
10.0000 mg | Freq: Once | INTRAMUSCULAR | Status: AC
  Administered 2014-09-23: 10 mg via INTRAVENOUS

## 2014-09-23 MED ORDER — SODIUM CHLORIDE 0.9 % IR SOLN
Status: DC | PRN
  Administered 2014-09-23: 1000 mL

## 2014-09-23 MED ORDER — MIDAZOLAM HCL 2 MG/2ML IJ SOLN
INTRAMUSCULAR | Status: AC
  Filled 2014-09-23: qty 2

## 2014-09-23 MED ORDER — SODIUM CHLORIDE 0.9 % IV SOLN
INTRAVENOUS | Status: DC
Start: 2014-09-23 — End: 2014-09-25
  Administered 2014-09-23 – 2014-09-24 (×2): via INTRAVENOUS

## 2014-09-23 MED ORDER — PROPOFOL 10 MG/ML IV BOLUS
INTRAVENOUS | Status: AC
  Filled 2014-09-23: qty 20

## 2014-09-23 MED ORDER — MENTHOL 3 MG MT LOZG
1.0000 | LOZENGE | OROMUCOSAL | Status: DC | PRN
  Filled 2014-09-23 (×2): qty 9

## 2014-09-23 MED ORDER — METHOCARBAMOL 500 MG PO TABS
500.0000 mg | ORAL_TABLET | Freq: Four times a day (QID) | ORAL | Status: DC | PRN
Start: 2014-09-23 — End: 2014-09-25
  Administered 2014-09-23 – 2014-09-25 (×2): 500 mg via ORAL
  Filled 2014-09-23 (×2): qty 1

## 2014-09-23 MED ORDER — METFORMIN HCL 500 MG PO TABS
1000.0000 mg | ORAL_TABLET | Freq: Every day | ORAL | Status: DC
  Administered 2014-09-25: 1000 mg via ORAL
  Filled 2014-09-23 (×3): qty 2

## 2014-09-23 MED ORDER — VANCOMYCIN HCL IN DEXTROSE 1-5 GM/200ML-% IV SOLN
1000.0000 mg | INTRAVENOUS | Status: AC
  Administered 2014-09-23: 1000 mg via INTRAVENOUS
  Filled 2014-09-23: qty 200

## 2014-09-23 MED ORDER — BUSPIRONE HCL 5 MG PO TABS
5.0000 mg | ORAL_TABLET | Freq: Three times a day (TID) | ORAL | Status: DC | PRN
  Filled 2014-09-23: qty 3

## 2014-09-23 MED ORDER — BUPIVACAINE IN DEXTROSE 0.75-8.25 % IT SOLN
INTRATHECAL | Status: DC | PRN
  Administered 2014-09-23: 1.5 mL via INTRATHECAL

## 2014-09-23 MED ORDER — ACETAMINOPHEN 500 MG PO TABS
1000.0000 mg | ORAL_TABLET | Freq: Four times a day (QID) | ORAL | Status: AC
  Administered 2014-09-23 – 2014-09-24 (×3): 1000 mg via ORAL
  Filled 2014-09-23 (×3): qty 2

## 2014-09-23 MED ORDER — TRANEXAMIC ACID 1000 MG/10ML IV SOLN
1000.0000 mg | INTRAVENOUS | Status: AC
  Administered 2014-09-23: 1000 mg via INTRAVENOUS
  Filled 2014-09-23: qty 10

## 2014-09-23 MED ORDER — MIDAZOLAM HCL 5 MG/5ML IJ SOLN
INTRAMUSCULAR | Status: DC | PRN
  Administered 2014-09-23 (×2): 1 mg via INTRAVENOUS

## 2014-09-23 MED ORDER — METHOCARBAMOL 1000 MG/10ML IJ SOLN
500.0000 mg | Freq: Four times a day (QID) | INTRAVENOUS | Status: DC | PRN
  Administered 2014-09-23: 500 mg via INTRAVENOUS
  Filled 2014-09-23 (×3): qty 5

## 2014-09-23 MED ORDER — PANTOPRAZOLE SODIUM 40 MG PO TBEC
40.0000 mg | DELAYED_RELEASE_TABLET | Freq: Every day | ORAL | Status: DC
  Administered 2014-09-24 – 2014-09-25 (×2): 40 mg via ORAL
  Filled 2014-09-23 (×2): qty 1

## 2014-09-23 MED ORDER — PHENOL 1.4 % MT LIQD
1.0000 | OROMUCOSAL | Status: DC | PRN
  Filled 2014-09-23: qty 177

## 2014-09-23 MED ORDER — FENTANYL CITRATE (PF) 100 MCG/2ML IJ SOLN: 25.0000 ug | INTRAMUSCULAR | Status: DC | PRN

## 2014-09-23 MED ORDER — PHENTERMINE HCL 37.5 MG PO CAPS: 37.5000 mg | ORAL_CAPSULE | Freq: Every morning | ORAL | Status: DC

## 2014-09-23 MED ORDER — TRAMADOL HCL 50 MG PO TABS
50.0000 mg | ORAL_TABLET | Freq: Four times a day (QID) | ORAL | Status: DC | PRN
  Administered 2014-09-24: 50 mg via ORAL
  Filled 2014-09-23: qty 1

## 2014-09-23 MED ORDER — CLONIDINE HCL 0.1 MG PO TABS
0.1000 mg | ORAL_TABLET | Freq: Two times a day (BID) | ORAL | Status: DC | PRN
  Administered 2014-09-23 – 2014-09-24 (×3): 0.1 mg via ORAL
  Filled 2014-09-23 (×5): qty 1

## 2014-09-23 MED ORDER — DIPHENHYDRAMINE HCL 12.5 MG/5ML PO ELIX
12.5000 mg | ORAL_SOLUTION | ORAL | Status: DC | PRN
  Administered 2014-09-24: 25 mg via ORAL
  Filled 2014-09-23: qty 10

## 2014-09-23 MED ORDER — BUPIVACAINE HCL (PF) 0.25 % IJ SOLN
INTRAMUSCULAR | Status: DC | PRN
Start: 2014-09-23 — End: 2014-09-23
  Administered 2014-09-23: 30 mL

## 2014-09-23 MED ORDER — FENTANYL CITRATE (PF) 100 MCG/2ML IJ SOLN
INTRAMUSCULAR | Status: DC | PRN
  Administered 2014-09-23 (×2): 50 ug via INTRAVENOUS

## 2014-09-23 MED ORDER — ONDANSETRON HCL 4 MG PO TABS
4.0000 mg | ORAL_TABLET | Freq: Four times a day (QID) | ORAL | Status: DC | PRN
Start: 2014-09-23 — End: 2014-09-25

## 2014-09-23 MED ORDER — BUPIVACAINE LIPOSOME 1.3 % IJ SUSP
20.0000 mL | Freq: Once | INTRAMUSCULAR | Status: DC
  Filled 2014-09-23: qty 20

## 2014-09-23 MED ORDER — ONDANSETRON HCL 4 MG/2ML IJ SOLN: 4.0000 mg | Freq: Four times a day (QID) | INTRAMUSCULAR | Status: DC | PRN

## 2014-09-23 MED ORDER — GLIMEPIRIDE 2 MG PO TABS
2.0000 mg | ORAL_TABLET | Freq: Every day | ORAL | Status: DC
  Administered 2014-09-24 – 2014-09-25 (×2): 2 mg via ORAL
  Filled 2014-09-23 (×3): qty 1

## 2014-09-23 MED ORDER — DOCUSATE SODIUM 100 MG PO CAPS
100.0000 mg | ORAL_CAPSULE | Freq: Two times a day (BID) | ORAL | Status: DC
  Administered 2014-09-23 – 2014-09-25 (×4): 100 mg via ORAL

## 2014-09-23 MED ORDER — DEXAMETHASONE SODIUM PHOSPHATE 10 MG/ML IJ SOLN
10.0000 mg | Freq: Once | INTRAMUSCULAR | Status: DC
  Filled 2014-09-23: qty 1

## 2014-09-23 MED ORDER — PROMETHAZINE HCL 25 MG/ML IJ SOLN: 6.2500 mg | INTRAMUSCULAR | Status: DC | PRN

## 2014-09-23 MED ORDER — ONDANSETRON HCL 4 MG/2ML IJ SOLN
INTRAMUSCULAR | Status: AC
  Filled 2014-09-23: qty 2

## 2014-09-23 MED ORDER — ONDANSETRON HCL 4 MG/2ML IJ SOLN
INTRAMUSCULAR | Status: DC | PRN
  Administered 2014-09-23: 4 mg via INTRAVENOUS

## 2014-09-23 MED ORDER — SODIUM CHLORIDE 0.9 % IJ SOLN
INTRAMUSCULAR | Status: DC | PRN
  Administered 2014-09-23: 30 mL

## 2014-09-23 MED ORDER — BUPIVACAINE LIPOSOME 1.3 % IJ SUSP
INTRAMUSCULAR | Status: DC | PRN
  Administered 2014-09-23: 20 mL

## 2014-09-23 MED ORDER — APIXABAN 2.5 MG PO TABS
2.5000 mg | ORAL_TABLET | Freq: Two times a day (BID) | ORAL | Status: DC
  Administered 2014-09-24 – 2014-09-25 (×3): 2.5 mg via ORAL
  Filled 2014-09-23 (×5): qty 1

## 2014-09-23 MED ORDER — BUPIVACAINE HCL (PF) 0.25 % IJ SOLN
INTRAMUSCULAR | Status: AC
  Filled 2014-09-23: qty 30

## 2014-09-23 MED ORDER — PROPOFOL INFUSION 10 MG/ML OPTIME
INTRAVENOUS | Status: DC | PRN
  Administered 2014-09-23: 75 ug/kg/min via INTRAVENOUS

## 2014-09-23 MED ORDER — ACETAMINOPHEN 10 MG/ML IV SOLN
1000.0000 mg | Freq: Once | INTRAVENOUS | Status: AC
  Administered 2014-09-23: 1000 mg via INTRAVENOUS
  Filled 2014-09-23: qty 100

## 2014-09-23 MED ORDER — LACTATED RINGERS IV SOLN
INTRAVENOUS | Status: DC
  Administered 2014-09-23 (×2): via INTRAVENOUS

## 2014-09-23 MED ORDER — VANCOMYCIN HCL IN DEXTROSE 1-5 GM/200ML-% IV SOLN
1000.0000 mg | Freq: Two times a day (BID) | INTRAVENOUS | Status: AC
  Administered 2014-09-23: 1000 mg via INTRAVENOUS
  Filled 2014-09-23: qty 200

## 2014-09-23 MED ORDER — SODIUM CHLORIDE 0.9 % IJ SOLN
INTRAMUSCULAR | Status: AC
  Filled 2014-09-23: qty 50

## 2014-09-23 MED ORDER — FLEET ENEMA 7-19 GM/118ML RE ENEM: 1.0000 | ENEMA | Freq: Once | RECTAL | Status: AC | PRN

## 2014-09-23 MED ORDER — METOCLOPRAMIDE HCL 10 MG PO TABS: 5.0000 mg | ORAL_TABLET | Freq: Three times a day (TID) | ORAL | Status: DC | PRN

## 2014-09-23 MED ORDER — MORPHINE SULFATE 2 MG/ML IJ SOLN: 1.0000 mg | INTRAMUSCULAR | Status: DC | PRN

## 2014-09-23 MED ORDER — FENTANYL CITRATE (PF) 100 MCG/2ML IJ SOLN
INTRAMUSCULAR | Status: AC
  Filled 2014-09-23: qty 2

## 2014-09-23 MED ORDER — DEXAMETHASONE SODIUM PHOSPHATE 10 MG/ML IJ SOLN
INTRAMUSCULAR | Status: AC
  Filled 2014-09-23: qty 1

## 2014-09-23 MED ORDER — LIDOCAINE HCL (CARDIAC) 20 MG/ML IV SOLN
INTRAVENOUS | Status: AC
  Filled 2014-09-23: qty 5

## 2014-09-23 MED ORDER — POLYETHYLENE GLYCOL 3350 17 G PO PACK: 17.0000 g | PACK | Freq: Every day | ORAL | Status: DC | PRN

## 2014-09-23 MED ORDER — 0.9 % SODIUM CHLORIDE (POUR BTL) OPTIME
TOPICAL | Status: DC | PRN
  Administered 2014-09-23: 1000 mL

## 2014-09-23 MED ORDER — SODIUM CHLORIDE 0.9 % IV SOLN: INTRAVENOUS | Status: DC

## 2014-09-23 MED ORDER — ACETAMINOPHEN 650 MG RE SUPP: 650.0000 mg | Freq: Four times a day (QID) | RECTAL | Status: DC | PRN

## 2014-09-23 MED ORDER — LIDOCAINE HCL (CARDIAC) 20 MG/ML IV SOLN
INTRAVENOUS | Status: DC | PRN
  Administered 2014-09-23: 50 mg via INTRAVENOUS

## 2014-09-23 MED ORDER — ACCU-CHEK MULTICLIX LANCETS MISC: Status: DC

## 2014-09-23 MED ORDER — BISACODYL 10 MG RE SUPP: 10.0000 mg | Freq: Every day | RECTAL | Status: DC | PRN

## 2014-09-23 MED ORDER — OXYCODONE HCL 5 MG PO TABS
5.0000 mg | ORAL_TABLET | ORAL | Status: DC | PRN
  Administered 2014-09-23: 5 mg via ORAL
  Administered 2014-09-23 (×2): 10 mg via ORAL
  Administered 2014-09-24 – 2014-09-25 (×3): 5 mg via ORAL
  Filled 2014-09-23: qty 1
  Filled 2014-09-23: qty 2
  Filled 2014-09-23: qty 1
  Filled 2014-09-23: qty 2
  Filled 2014-09-23 (×2): qty 1

## 2014-09-23 MED ORDER — CLINDAMYCIN HCL 300 MG PO CAPS
300.0000 mg | ORAL_CAPSULE | Freq: Three times a day (TID) | ORAL | Status: DC
  Administered 2014-09-23 – 2014-09-25 (×6): 300 mg via ORAL
  Filled 2014-09-23 (×8): qty 1

## 2014-09-23 MED ORDER — METOCLOPRAMIDE HCL 5 MG/ML IJ SOLN
5.0000 mg | Freq: Three times a day (TID) | INTRAMUSCULAR | Status: DC | PRN
Start: 2014-09-23 — End: 2014-09-25

## 2014-09-23 MED ORDER — PROPRANOLOL HCL 20 MG PO TABS
20.0000 mg | ORAL_TABLET | Freq: Two times a day (BID) | ORAL | Status: DC | PRN
  Administered 2014-09-23: 20 mg via ORAL
  Filled 2014-09-23 (×2): qty 1

## 2014-09-23 MED ORDER — MEPERIDINE HCL 50 MG/ML IJ SOLN: 6.2500 mg | INTRAMUSCULAR | Status: DC | PRN

## 2014-09-23 MED ORDER — ACETAMINOPHEN 325 MG PO TABS
650.0000 mg | ORAL_TABLET | Freq: Four times a day (QID) | ORAL | Status: DC | PRN
  Administered 2014-09-25: 650 mg via ORAL
  Filled 2014-09-23: qty 2

## 2014-09-23 SURGICAL SUPPLY — 55 items
BAG DECANTER FOR FLEXI CONT (MISCELLANEOUS) ×3 IMPLANT
BAG SPEC THK2 15X12 ZIP CLS (MISCELLANEOUS)
BAG ZIPLOCK 12X15 (MISCELLANEOUS) IMPLANT
BANDAGE ELASTIC 6 VELCRO ST LF (GAUZE/BANDAGES/DRESSINGS) ×3 IMPLANT
BANDAGE ESMARK 6X9 LF (GAUZE/BANDAGES/DRESSINGS) ×1 IMPLANT
BLADE SAW RECIPROCATING 77.5 (BLADE) ×3 IMPLANT
BLADE SAW SGTL 13.0X1.19X90.0M (BLADE) ×3 IMPLANT
BNDG CMPR 9X6 STRL LF SNTH (GAUZE/BANDAGES/DRESSINGS) ×1
BNDG ESMARK 6X9 LF (GAUZE/BANDAGES/DRESSINGS) ×3
BOWL SMART MIX CTS (DISPOSABLE) ×3 IMPLANT
BUR OVAL CARBIDE 4.0 (BURR) ×3 IMPLANT
CAPT KNEE PARTIAL 2 ×3 IMPLANT
CEMENT HV SMART SET (Cement) ×3 IMPLANT
CLOSURE WOUND 1/2 X4 (GAUZE/BANDAGES/DRESSINGS) ×1
CUFF TOURN SGL QUICK 34 (TOURNIQUET CUFF) ×3
CUFF TRNQT CYL 34X4X40X1 (TOURNIQUET CUFF) ×1 IMPLANT
DRAPE EXTREMITY T 121X128X90 (DRAPE) ×3 IMPLANT
DRAPE POUCH INSTRU U-SHP 10X18 (DRAPES) ×3 IMPLANT
DRSG ADAPTIC 3X8 NADH LF (GAUZE/BANDAGES/DRESSINGS) ×3 IMPLANT
DRSG PAD ABDOMINAL 8X10 ST (GAUZE/BANDAGES/DRESSINGS) ×3 IMPLANT
DURAPREP 26ML APPLICATOR (WOUND CARE) ×3 IMPLANT
ELECT REM PT RETURN 9FT ADLT (ELECTROSURGICAL) ×3
ELECTRODE REM PT RTRN 9FT ADLT (ELECTROSURGICAL) ×1 IMPLANT
EVACUATOR 1/8 PVC DRAIN (DRAIN) ×3 IMPLANT
FACESHIELD WRAPAROUND (MASK) ×15 IMPLANT
FACESHIELD WRAPAROUND OR TEAM (MASK) ×5 IMPLANT
GAUZE SPONGE 4X4 12PLY STRL (GAUZE/BANDAGES/DRESSINGS) ×3 IMPLANT
GLOVE BIO SURGEON STRL SZ7.5 (GLOVE) ×3 IMPLANT
GLOVE BIO SURGEON STRL SZ8 (GLOVE) ×3 IMPLANT
GLOVE BIOGEL PI IND STRL 8 (GLOVE) ×2 IMPLANT
GLOVE BIOGEL PI INDICATOR 8 (GLOVE) ×4
GOWN STRL REUS W/TWL LRG LVL3 (GOWN DISPOSABLE) ×3 IMPLANT
GOWN STRL REUS W/TWL XL LVL3 (GOWN DISPOSABLE) ×3 IMPLANT
HANDPIECE INTERPULSE COAX TIP (DISPOSABLE) ×3
IMMOBILIZER KNEE 20 (SOFTGOODS) ×2 IMPLANT
KIT BASIN OR (CUSTOM PROCEDURE TRAY) ×3 IMPLANT
KIT IMPL STRL TIB IPOLY IUNI IMPLANT
MANIFOLD NEPTUNE II (INSTRUMENTS) ×3 IMPLANT
NDL SAFETY ECLIPSE 18X1.5 (NEEDLE) ×2 IMPLANT
NEEDLE HYPO 18GX1.5 SHARP (NEEDLE) ×6
PACK TOTAL JOINT (CUSTOM PROCEDURE TRAY) ×3 IMPLANT
PADDING CAST COTTON 6X4 STRL (CAST SUPPLIES) ×4 IMPLANT
POSITIONER SURGICAL ARM (MISCELLANEOUS) ×3 IMPLANT
SET HNDPC FAN SPRY TIP SCT (DISPOSABLE) ×1 IMPLANT
STRIP CLOSURE SKIN 1/2X4 (GAUZE/BANDAGES/DRESSINGS) ×3 IMPLANT
SUCTION FRAZIER 12FR DISP (SUCTIONS) ×3 IMPLANT
SUT MNCRL AB 4-0 PS2 18 (SUTURE) ×3 IMPLANT
SUT VIC AB 2-0 CT1 27 (SUTURE) ×6
SUT VIC AB 2-0 CT1 TAPERPNT 27 (SUTURE) ×2 IMPLANT
SUT VLOC 180 0 24IN GS25 (SUTURE) ×3 IMPLANT
SYR 20CC LL (SYRINGE) ×3 IMPLANT
SYR 50ML LL SCALE MARK (SYRINGE) ×3 IMPLANT
TOWEL OR 17X26 10 PK STRL BLUE (TOWEL DISPOSABLE) ×3 IMPLANT
TRAY FOLEY W/METER SILVER 14FR (SET/KITS/TRAYS/PACK) ×3 IMPLANT
WRAP KNEE MAXI GEL POST OP (GAUZE/BANDAGES/DRESSINGS) ×2 IMPLANT

## 2014-09-23 NOTE — Anesthesia Procedure Notes (Signed)
Spinal Patient location during procedure: OR Staffing Anesthesiologist: Odilia Damico Performed by: anesthesiologist  Preanesthetic Checklist Completed: patient identified, site marked, surgical consent, pre-op evaluation, timeout performed, IV checked, risks and benefits discussed and monitors and equipment checked Spinal Block Patient position: sitting Prep: Betadine Patient monitoring: heart rate, continuous pulse ox and blood pressure Approach: right paramedian Location: L4-5 Injection technique: single-shot Needle Needle type: Spinocan  Needle gauge: 22 G Needle length: 9 cm Additional Notes Expiration date of kit checked and confirmed. Patient tolerated procedure well, without complications.     

## 2014-09-23 NOTE — Interval H&P Note (Signed)
History and Physical Interval Note:  09/23/2014 6:50 AM  Leslie Benton  has presented today for surgery, with the diagnosis of left knee medial compartmental osteoarthritis  The various methods of treatment have been discussed with the patient and family. After consideration of risks, benefits and other options for treatment, the patient has consented to  Procedure(s): LEFT UNICOMPARTMENTAL KNEE (Left) as a surgical intervention .  The patient's history has been reviewed, patient examined, no change in status, stable for surgery.  I have reviewed the patient's chart and labs.  Questions were answered to the patient's satisfaction.     Gearlean Alf

## 2014-09-23 NOTE — Op Note (Signed)
OPERATIVE REPORT  PREOPERATIVE DIAGNOSIS: Medial compartment osteoarthritis, Left knee  POSTOPERATIVE DIAGNOSIS: Medial compartment osteoarthritis, Left knee  PROCEDURE:Left knee medial unicompartmental arthroplasty.   SURGEON: [name], MD   ASSISTANT: Ardeen Jourdain, PA-C  ANESTHESIA:  Spinal.   ESTIMATED BLOOD LOSS: Minimal.   DRAINS: Hemovac x1.   TOURNIQUET TIME: 32 minutes at 300 mmHg.   COMPLICATIONS: None.   CONDITION: Stable to recovery.   BRIEF CLINICAL NOTE:Leslie Benton is a 74 y.o. female, who has  significant isolated medial compartment arthritis of the Left knee. She has had nonoperative management including injections. She has had  cortisone and viscous supplements. Unfortunately, the pain persists.  Radiograph showed isolated medial compartment bone-on-bone arthritis  with normal-appearing patellofemoral and lateral compartments. She  presents now for left knee unicompartmental arthroplasty.   PROCEDURE IN DETAIL: After successful administration of  Spinal anesthetic, a tourniquet was placed high on the  Left thigh and left lower extremity prepped and draped in usual sterile fashion. Extremity was wrapped in an Esmarch, knee flexed, and tourniquet inflated to 300 mmHg. A midline incision was made with a 10 blade through subcutaneous  tissue to the extensor mechanism. A fresh blade was used to make a  medial parapatellar arthrotomy. Soft tissue on the proximal medial  tibia subperiosteally elevated to the joint line with a knife and into  the semimembranosus bursa with a Cobb elevator. The patella was  subluxed laterally, and the knee flexed 90 degrees. The ACL was intact.  The marginal osteophytes on the medial femur and tibia were removed with  a rongeur. The medial meniscus was also removed. The femoral cutting  block where the conformis unicompartmental knee system was placed along  the femur. There was excellent fit. I traced the  outline. We then  removed any remaining cartilage within this outline. We then placed the  cutting block again and pinned in position. The posterior femoral cut  was made, it was approximately 5 mm. The lug holes for the femoral  component were then drilled through the cutting block. The cutting  block was subsequently removed. We then utilized the high speed burr to  create a small trough at the superior aspect of the components that of  the inset and would not overhang the cartilage. The trial was placed,  it had excellent fit. The trial was subsequently removed.       The trial was placed again and the B chip was placed. There was  excellent balance throughout full motion. Also with excellent fit on  her tibia. This was removed as was the femoral trial. A curette was  used to remove any remaining cartilage from the tibia. The tibial  cutting block was then placed and there was a perfect fit on the tibial  surface. The appropriate slope was placed and it was pinned in  position. The reciprocating saw was used to make the central cut and  then the oscillating saw used to make the horizontal cut. The bone  fragment was then removed. The tibial trial was placed and had perfect  fit on the tibia. We then drilled the 2 lug holes and did the keel punch.  We then placed tibia trial femur, and a 8 mm trial insert. There was  excellent stability throughout full range of motion and no impingement.  The trial was then removed. We drilled small holes in the distal  femur in order to create more conduits for the cement. The cut bone  surfaces were  thoroughly irrigated with pulsatile lavage while the  cement was mixed on the back table. We then cemented the tibial  component into place, impacted it and removed the extruded cement. The  same was done for the femoral component. Trial 8-mm inserts placed,  knee held in full extension, and all extruded cement removed. While the  cement was hardening, I  injected the extensor mechanism, periosteum of  the femur and subcu tissues, a total of 20 mL of Exparel mixed with 30  mL of saline and then did an additional injection of 20 mL of 0.25%  Marcaine into the same tissues. When the cement had fully hardened,  then the permanent polyethylene was placed in tibial tray. There was  excellent stability throughout full range of motion with no lift off the  component and no evidence of any impingement. Wound was copiously  irrigated with saline solution, and the arthrotomy closed over a Hemovac  drain with a running #1 V-Loc suture. The subcutaneous was closed with  interrupted 2-0 Vicryl and subcuticular running 4-0 Monocryl. The drain  was hooked to suction. Incision cleaned and dried and Steri-Strips and  a bulky sterile dressing applied. The tourniquet was released after a  total time of 28 minutes. This was done after closing the extensor  mechanism. The wound was closed and a bulky sterile dressing was  applied. She was placed into a knee immobilizer, awakened and  transported to recovery room in stable condition.  Please note that a surgical assistant was a medical necessity for this  procedure in order to perform it in a safe and expeditious manner.  Assistance was necessary for retracting vital ligaments, neurovascular  structures, as well as for proper positioning of the limb to allow for  appropriate bone cuts and appropriate placement of the prosthesis.    Dione Plover Uma Jerde, MD

## 2014-09-23 NOTE — Anesthesia Preprocedure Evaluation (Addendum)
Anesthesia Evaluation  Patient identified by MRN, date of birth, ID band Patient awake    Reviewed: Allergy & Precautions, NPO status , Patient's Chart, lab work & pertinent test results  Airway Mallampati: II  TM Distance: >3 FB Neck ROM: Full    Dental no notable dental hx.    Pulmonary neg pulmonary ROS,  breath sounds clear to auscultation  Pulmonary exam normal       Cardiovascular hypertension, Pt. on medications Normal cardiovascular examRhythm:Regular Rate:Normal     Neuro/Psych negative neurological ROS  negative psych ROS   GI/Hepatic negative GI ROS, Neg liver ROS,   Endo/Other  diabetes, Type 2, Oral Hypoglycemic Agents  Renal/GU negative Renal ROS  negative genitourinary   Musculoskeletal negative musculoskeletal ROS (+)   Abdominal   Peds negative pediatric ROS (+)  Hematology negative hematology ROS (+)   Anesthesia Other Findings   Reproductive/Obstetrics negative OB ROS                             Anesthesia Physical Anesthesia Plan  ASA: II  Anesthesia Plan: Spinal   Post-op Pain Management:    Induction:   Airway Management Planned: Simple Face Mask  Additional Equipment:   Intra-op Plan:   Post-operative Plan:   Informed Consent: I have reviewed the patients History and Physical, chart, labs and discussed the procedure including the risks, benefits and alternatives for the proposed anesthesia with the patient or authorized representative who has indicated his/her understanding and acceptance.   Dental advisory given  Plan Discussed with: CRNA  Anesthesia Plan Comments:        Anesthesia Quick Evaluation

## 2014-09-23 NOTE — Evaluation (Signed)
Physical Therapy Evaluation Patient Details Name: Leslie Benton MRN: [id number] DOB: 1940/10/08 Today's Date: 09/23/2014   History of Present Illness  L UKR  Clinical Impression  Pt s/p L UKR presents with decreased L LE strength/ROM and post op pain limiting functional mobility.  Pt should progress to dc home with family assist and HHPT follow up.   Follow Up Recommendations Home health PT    Equipment Recommendations  None recommended by PT    Recommendations for Other Services OT consult     Precautions / Restrictions Precautions Precautions: Knee;Fall Required Braces or Orthoses: Knee Immobilizer - Left Knee Immobilizer - Left: Discontinue once straight leg raise with < 10 degree lag Restrictions Weight Bearing Restrictions: No Other Position/Activity Restrictions: WBAT      Mobility  Bed Mobility Overal bed mobility: Needs Assistance Bed Mobility: Supine to Sit     Supine to sit: Mod assist     General bed mobility comments: cues for sequence and use of R LE to self assist  Transfers Overall transfer level: Needs assistance Equipment used: Rolling walker (2 wheeled) Transfers: Sit to/from Stand Sit to Stand: Min assist;Mod assist         General transfer comment: cues for LE management and use of UEs to self assist  Ambulation/Gait Ambulation/Gait assistance: Min assist;Mod assist Ambulation Distance (Feet): 6 Feet Assistive device: Rolling walker (2 wheeled) Gait Pattern/deviations: Step-to pattern;Decreased step length - right;Decreased step length - left;Shuffle;Trunk flexed Gait velocity: decr   General Gait Details: cues for sequence, posture and position from [employer]            Wheelchair Mobility    Modified Rankin (Stroke Patients Only)       Balance                                             Pertinent Vitals/Pain Pain Assessment: 0-10 Pain Score: 4  Pain Location: L knee Pain Descriptors /  Indicators: Aching;Sore Pain Intervention(s): Limited activity within patient's tolerance;Monitored during session;Premedicated before session;Ice applied    Home Living Family/patient expects to be discharged to:: Private residence Living Arrangements: Spouse/significant other;Children Available Help at Discharge: Family Type of Home: House Home Access: Stairs to enter Entrance Stairs-Rails: Right Entrance Stairs-Number of Steps: 1+3 Home Layout: One level Home Equipment: Environmental consultant - 2 wheels;Walker - 4 wheels      Prior Function Level of Independence: Independent               Hand Dominance   Dominant Hand: Right    Extremity/Trunk Assessment   Upper Extremity Assessment: Overall WFL for tasks assessed           Lower Extremity Assessment: LLE deficits/detail      Cervical / Trunk Assessment: Normal  Communication   Communication: No difficulties  Cognition Arousal/Alertness: Awake/alert Behavior During Therapy: WFL for tasks assessed/performed Overall Cognitive Status: Within Functional Limits for tasks assessed                      General Comments      Exercises Total Joint Exercises Ankle Circles/Pumps: AROM;Both;10 reps;Supine      Assessment/Plan    PT Assessment Patient needs continued PT services  PT Diagnosis Difficulty walking   PT Problem List Decreased strength;Decreased range of motion;Decreased activity tolerance;Decreased mobility;Decreased knowledge of use of DME;Obesity;Pain  PT  Treatment Interventions DME instruction;Gait training;Stair training;Functional mobility training;Therapeutic activities;Therapeutic exercise;Patient/family education   PT Goals (Current goals can be found in the Care Plan section) Acute Rehab PT Goals Patient Stated Goal: Resume previous lifestyle with decreased pain PT Goal Formulation: With patient Time For Goal Achievement: 09/28/14 Potential to Achieve Goals: Good    Frequency 7X/week    Barriers to discharge Decreased caregiver support Spouse is also on RW    Co-evaluation               End of Session Equipment Utilized During Treatment: Gait belt;Left knee immobilizer Activity Tolerance: Patient tolerated treatment well (ltd by elevated BP per RN report) Patient left: in chair;with call bell/phone within reach;with family/visitor present Nurse Communication: Mobility status    Functional Assessment Tool Used: Clinical judgement Functional Limitation: Mobility: Walking and moving around Mobility: Walking and Moving Around Current Status [phone]): At least 40 percent but less than 60 percent impaired, limited or restricted Mobility: Walking and Moving Around Goal Status [phone]): At least 1 percent but less than 20 percent impaired, limited or restricted    Time: 8984-2103 PT Time Calculation (min) (ACUTE ONLY): 30 min   Charges:   PT Evaluation $Initial PT Evaluation Tier I: 1 Procedure PT Treatments $Gait Training: 8-22 mins   PT G Codes:   PT G-Codes **NOT FOR INPATIENT CLASS** Functional Assessment Tool Used: Clinical judgement Functional Limitation: Mobility: Walking and moving around Mobility: Walking and Moving Around Current Status ([id number]): At least 40 percent but less than 60 percent impaired, limited or restricted Mobility: Walking and Moving Around Goal Status [phone]): At least 1 percent but less than 20 percent impaired, limited or restricted    Beryl Hornberger 09/23/2014, 5:31 PM

## 2014-09-23 NOTE — H&P (View-Only) (Signed)
Leslie Benton DOB: [birth year] Married / Language: English / Race: White Female Date of Admission:  09/23/2014 CC:  Left Knee Pain History of Present Illness The patient is a 74 year old female who comes in for a preoperative History and Physical. The patient is scheduled for a left knee unicompartmental replacement to be performed by Dr. Dione Plover. Aluisio, MD at [facility] on 09-23-2014. The patient is a 74 year old female who presents today for follow up of their knee. The patient is being followed for their left knee pain and osteoarthritis. They are now several month(s) out from a cortisone injection. Symptoms reported today include: pain, aching, stiffness, instability, pain with weightbearing, difficulty ambulating and difficulty arising from chair. The patient feels that they are doing poorly and report their pain level to be moderate to severe. Current treatment includes: modified weightbearing, relative rest and activity modification. The patient has not gotten any relief of their symptoms with Cortisone injections. Note for "Follow-up Knee": She went to a place in [facility], at the recommendation of her husband. She had a "cold lazor" treatment. She said it made the knee worse. She said that the knee is definitely limiting what she can and cannot do. She feels out of balance because of the knee. She feels like the pain in the knee is making her walk worse and is affecting other body parts. She has had injections in the past without benefit. She has tried multiple other modalities without benefit. She is wondering whether or not surgical treatment is in order at this time. The pain is all along the medial aspect of her knee. They have been treated conservatively in the past for the above stated problem and despite conservative measures, they continue to have progressive pain and severe functional limitations and dysfunction. They have failed non-operative management including home  exercise, medications, and injections. It is felt that they would benefit from undergoing partial unicompartmental replacement. Risks and benefits of the procedure have been discussed with the patient and they elect to proceed with surgery. There are no active contraindications to surgery such as ongoing infection or rapidly progressive neurological disease.  Problem List/Past Medical  Primary osteoarthritis of left knee (M17.12) Lumbago (M54.5)01/18/2003 Diabetes Mellitus, Type II High blood pressure Hypercholesterolemia Osteoarthritis Anemia  Allergies Penicillin VK *PENICILLINS* Hives. Amoxicillin *PENICILLINS* Hives.  Family History  Father Pancreatic Cancer Siblings Sister - Endometrial Cancer  Social History Pain Contract no Current work status disabled Tobacco use never smoker Illicit drug use no Marital status married Children 2 Drug/Alcohol Rehab (Previously) no Drug/Alcohol Rehab (Currently) no Exercise Exercises daily; does gym / weights Living situation live with spouse Post-Surgical Plans Home versus Rehab  Medication History  Omeprazole (20MG  Capsule DR, Oral) Active. (only on occasion) BusPIRone HCl (15MG  Tablet, Oral) Active. (only as needed) Glimepiride (4MG  Tablet, Oral) Active. Levothyroxine Sodium (125MCG Tablet, Oral) Active. Phentermine HCl (37.5MG  Capsule, Oral) Active. Propranolol HCl (20MG  Tablet, Oral) Active. (as needed/rare occasion)  Past Surgical History  Tubal Ligation Arthroscopy of Knee left Tonsillectomy Hemorrhoidectomy   Review of Systems General Present- Fatigue. Not Present- Chills, Fever, Memory Loss, Night Sweats, Weight Gain and Weight Loss. Skin Not Present- Eczema, Hives, Itching, Lesions and Rash. HEENT Not Present- Dentures, Double Vision, Headache, Hearing Loss, Tinnitus and Visual Loss. Respiratory Not Present- Allergies, Chronic Cough, Coughing up blood, Shortness of breath at rest and  Shortness of breath with exertion. Cardiovascular Not Present- Chest Pain, Difficulty Breathing Lying Down, Murmur, Palpitations, Racing/skipping heartbeats  and Swelling. Gastrointestinal Not Present- Abdominal Pain, Bloody Stool, Constipation, Diarrhea, Difficulty Swallowing, Heartburn, Jaundice, Loss of appetitie, Nausea and Vomiting. Female Genitourinary Present- Urinating at Night. Not Present- Blood in Urine, Discharge, Flank Pain, Incontinence, Painful Urination, Urgency, Urinary frequency, Urinary Retention and Weak urinary stream. Musculoskeletal Present- Joint Pain (medial joint line pain) and Morning Stiffness. Not Present- Back Pain, Joint Swelling, Muscle Pain, Muscle Weakness and Spasms. Neurological Not Present- Blackout spells, Difficulty with balance, Dizziness, Paralysis, Tremor and Weakness. Psychiatric Not Present- Insomnia.  Vitals  Weight: 200 lb Height: 63in Body Surface Area: 1.93 m Body Mass Index: 35.43 kg/m  BP: 146/82 (Sitting, Left Arm, Standard)  Physical Exam General Mental Status -Alert, cooperative and good historian. General Appearance-pleasant, Not in acute distress. Orientation-Oriented X3. Build & Nutrition-Well nourished and Well developed.  Head and Neck Head-normocephalic, atraumatic . Neck Global Assessment - supple, no bruit auscultated on the right, no bruit auscultated on the left.  Eye Vision-Wears corrective lenses(wears readers). Pupil - Bilateral-Regular and Round. Motion - Bilateral-EOMI.  Chest and Lung Exam Auscultation Breath sounds - clear at anterior chest wall and clear at posterior chest wall. Adventitious sounds - No Adventitious sounds.  Cardiovascular Auscultation Rhythm - Regular rate and rhythm. Heart Sounds - S1 WNL and S2 WNL. Murmurs & Other Heart Sounds: Murmur 1 - Location - Aortic Area. Timing - Mid-systolic. Grade - III/VI. Character - Low pitched.  Abdomen Inspection Contour -  Generalized mild distention. Palpation/Percussion Tenderness - Abdomen is non-tender to palpation. Rigidity (guarding) - Abdomen is soft. Auscultation Auscultation of the abdomen reveals - Bowel sounds normal.  Female Genitourinary Note: Not done, not pertinent to present illness   Musculoskeletal Note: On examination, she is alert and oriented, in no apparent distress. Her left knee shows no effusion. Her range is zero to 125. She is very tender medially. There is no other tenderness noted. There is no instability noted.  RADIOGRAPHS: Her radiographs are reviewed and she has bone-on-bone arthritis in the medial compartment but no lateral or patellofemoral involvement.   Assessment & Plan Primary osteoarthritis of left knee (M17.12)  Note:Surgical Plans: Left Unicompartmental Knee Replacement  Disposition: Home versus Rehab  PCP: Dr. Raliegh Ip  IV TXA  Anesthesia Issues: None  Signed electronically by Joelene Millin, III PA-C

## 2014-09-23 NOTE — Progress Notes (Signed)
09/23/14 [place]  PA called reg bp 186/73 at this time. Patient with hx of htn but reports being off bp meds for a while. Order received for clonidine prn bid. Will continue to monitor patient

## 2014-09-23 NOTE — Anesthesia Postprocedure Evaluation (Signed)
  Anesthesia Post-op Note  Patient: Leslie Benton  Procedure(s) Performed: Procedure(s) (LRB): LEFT UNICOMPARTMENTAL KNEE (Left)  Patient Location: PACU  Anesthesia Type: Spinal  Level of Consciousness: awake and alert   Airway and Oxygen Therapy: Patient Spontanous Breathing  Post-op Pain: mild  Post-op Assessment: Post-op Vital signs reviewed, Patient's Cardiovascular Status Stable, Respiratory Function Stable, Patent Airway and No signs of Nausea or vomiting  Last Vitals:  Filed Vitals:   09/23/14 1315  BP: 163/73  Pulse: 77  Temp: 36.9 C  Resp: 16    Post-op Vital Signs: stable   Complications: No apparent anesthesia complications

## 2014-09-23 NOTE — Progress Notes (Signed)
MD made aware of elevated BP. Dr.Alusio provided telephone order to give oral propanolol 20mg  PRN for palpitations one time to decrease BP. Will recheck BP in 45 minutes.

## 2014-09-23 NOTE — Telephone Encounter (Signed)
Rx sent 

## 2014-09-23 NOTE — Telephone Encounter (Signed)
Patient is requesting a refill of Accu-chek multiclix lancets, #100 with 2 refills, use once daily if needed [place], [place], [place] [phone]

## 2014-09-23 NOTE — Transfer of Care (Signed)
Immediate Anesthesia Transfer of Care Note  Patient: Leslie Benton  Procedure(s) Performed: Procedure(s): LEFT UNICOMPARTMENTAL KNEE (Left)  Patient Location: PACU  Anesthesia Type:Spinal  Level of Consciousness: awake, alert  and oriented  Airway & Oxygen Therapy: Patient Spontanous Breathing and Patient connected to face mask oxygen  Post-op Assessment: Report given to RN and Post -op Vital signs reviewed and stable  Post vital signs: Reviewed and stable  Last Vitals:  Filed Vitals:   09/23/14 0617  BP: 180/73  Pulse: 72  Temp: 36.7 C  Resp: 18    Complications: No apparent anesthesia complications

## 2014-09-24 ENCOUNTER — Encounter (HOSPITAL_COMMUNITY): Payer: Self-pay | Admitting: Orthopedic Surgery

## 2014-09-24 DIAGNOSIS — E785 Hyperlipidemia, unspecified: Secondary | ICD-10-CM | POA: Diagnosis not present

## 2014-09-24 DIAGNOSIS — K219 Gastro-esophageal reflux disease without esophagitis: Secondary | ICD-10-CM | POA: Diagnosis not present

## 2014-09-24 DIAGNOSIS — E119 Type 2 diabetes mellitus without complications: Secondary | ICD-10-CM | POA: Diagnosis not present

## 2014-09-24 DIAGNOSIS — I10 Essential (primary) hypertension: Secondary | ICD-10-CM | POA: Diagnosis not present

## 2014-09-24 DIAGNOSIS — E780 Pure hypercholesterolemia: Secondary | ICD-10-CM | POA: Diagnosis not present

## 2014-09-24 DIAGNOSIS — M1712 Unilateral primary osteoarthritis, left knee: Secondary | ICD-10-CM | POA: Diagnosis not present

## 2014-09-24 LAB — BASIC METABOLIC PANEL
ANION GAP: 11 (ref 5–15)
BUN: 20 mg/dL (ref 6–20)
CALCIUM: 8.7 mg/dL — AB (ref 8.9–10.3)
CO2: 23 mmol/L (ref 22–32)
Chloride: 107 mmol/L (ref 101–111)
Creatinine, Ser: 0.94 mg/dL (ref 0.44–1.00)
GFR calc Af Amer: >60 mL/min (ref >60)
GFR calc non Af Amer: 58 mL/min — ABNORMAL LOW (ref >60)
Glucose, Bld: 162 mg/dL — ABNORMAL HIGH (ref 65–99)
Potassium: 3.8 mmol/L (ref 3.5–5.1)
Sodium: 141 mmol/L (ref 135–145)

## 2014-09-24 LAB — CBC
HCT: 43.8 % (ref 36.0–46.0)
Hemoglobin: 14 g/dL (ref 12.0–15.0)
MCH: 28.2 pg (ref 26.0–34.0)
MCHC: 32 g/dL (ref 30.0–36.0)
MCV: 88.1 fL (ref 78.0–100.0)
PLATELETS: 254 10*3/uL (ref 150–400)
RBC: 4.97 MIL/uL (ref 3.87–5.11)
RDW: 21.8 % — ABNORMAL HIGH (ref 11.5–15.5)
WBC: 9.6 10*3/uL (ref 4.0–10.5)

## 2014-09-24 MED ORDER — OXYCODONE HCL 5 MG PO TABS: 5.0000 mg | ORAL_TABLET | ORAL | Status: DC | PRN

## 2014-09-24 MED ORDER — APIXABAN 2.5 MG PO TABS: 2.5000 mg | ORAL_TABLET | Freq: Two times a day (BID) | ORAL | Status: DC

## 2014-09-24 MED ORDER — METHOCARBAMOL 500 MG PO TABS: 500.0000 mg | ORAL_TABLET | Freq: Four times a day (QID) | ORAL | Status: DC | PRN

## 2014-09-24 MED ORDER — TRAMADOL HCL 50 MG PO TABS: 50.0000 mg | ORAL_TABLET | Freq: Four times a day (QID) | ORAL | Status: DC | PRN

## 2014-09-24 NOTE — Progress Notes (Signed)
Physical Therapy Treatment Patient Details Name: Leslie Benton MRN: [id number] DOB: [birth year] Today's Date: 09/24/2014    History of Present Illness L UKR    PT Comments    POD # 1 am session.  Pt progressing slowly.  Demonstrates impaired cognition and difficulty following commands (esp during gait).  Simple VC's needed for gait sequencing as pt still struggled to perform effectively.  HIGH FALL RISK.  No family present for education. Amb a limited distance due to pain level and grogginess. Required MAX VC's for safety with transfers and turning to recliner.  Will return later for another session.  Follow Up Recommendations  Home health PT     Equipment Recommendations  None recommended by PT    Recommendations for Other Services       Precautions / Restrictions Precautions Precautions: Knee;Fall Precaution Comments: instructed pt on use for amb and stairs Required Braces or Orthoses: Knee Immobilizer - Left Knee Immobilizer - Left: Discontinue once straight leg raise with < 10 degree lag Restrictions Weight Bearing Restrictions: No Other Position/Activity Restrictions: WBAT    Mobility  Bed Mobility Overal bed mobility: Needs Assistance Bed Mobility: Supine to Sit     Supine to sit: Mod assist     General bed mobility comments: assist to scoot and assist to guide L LE.  Also required increased time and 75% VC's to stay on task.  Pt hyper talkative and easily distracted from task.   Transfers Overall transfer level: Needs assistance Equipment used: Rolling walker (2 wheeled) Transfers: Sit to/from Stand Sit to Stand: Min assist         General transfer comment: 75% VC's on proper tech and hand placement.  100% VC's on turn completion and walker completion prior to sit. Pt tends to "plop".   Ambulation/Gait Ambulation/Gait assistance: Min assist Ambulation Distance (Feet): 18 Feet Assistive device: Rolling walker (2 wheeled) Gait Pattern/deviations: Step-to  pattern;Decreased stance time - left;Decreased step length - right;Decreased step length - left;Trunk flexed Gait velocity: decreased   General Gait Details: 75% VC's on proper sequencing, proper walker to self distance and safety with walker advancement.     Stairs            Wheelchair Mobility    Modified Rankin (Stroke Patients Only)       Balance                                    Cognition Arousal/Alertness: Awake/alert Behavior During Therapy:  (odd) Overall Cognitive Status: Impaired/Different from baseline Area of Impairment: Following commands;Safety/judgement;Awareness;Problem solving         Safety/Judgement: Decreased awareness of safety;Decreased awareness of deficits   Problem Solving: Slow processing;Requires verbal cues      Exercises      General Comments        Pertinent Vitals/Pain Pain Assessment: 0-10 Pain Score: 5  Pain Location: L knee Pain Descriptors / Indicators: Aching;Sore Pain Intervention(s): Monitored during session;Premedicated before session;Repositioned;Ice applied    Home Living                      Prior Function            PT Goals (current goals can now be found in the care plan section) Progress towards PT goals: Progressing toward goals    Frequency  7X/week    PT Plan      Co-evaluation  End of Session Equipment Utilized During Treatment: Gait belt;Left knee immobilizer Activity Tolerance: Other (comment) (limited by impaired cognition) Patient left: in chair;with call bell/phone within reach     Time: 0920-0958 PT Time Calculation (min) (ACUTE ONLY): 38 min  Charges:  $Gait Training: 8-22 mins $Therapeutic Activity: 23-37 mins                    G Codes:      Rica Koyanagi  PTA [facility]  Acute  Rehab Pager      [phone]

## 2014-09-24 NOTE — Evaluation (Signed)
Occupational Therapy Evaluation Patient Details Name: Leslie Benton MRN: [id number] DOB: 10/07/1940 Today's Date: 09/24/2014    History of Present Illness L UKR   Clinical Impression   This 74 year old female was admitted for the above surgery.  At baseline, she is independent with adls.  She needs up to max A for LB adls.  Pt also needed mod safety cues during SPT to 3:1 commode.  She will benefit from skilled OT to increase safety and independence.  Goals in acute are for min guard to supervision for toileting and standing grooming tasks.      Follow Up Recommendations  Home health OT;Supervision/Assistance - 24 hour    Equipment Recommendations  None recommended by OT    Recommendations for Other Services       Precautions / Restrictions Precautions Precautions: Knee;Fall Required Braces or Orthoses: Knee Immobilizer - Left Knee Immobilizer - Left: Discontinue once straight leg raise with < 10 degree lag Restrictions Other Position/Activity Restrictions: WBAT      Mobility Bed Mobility               General bed mobility comments: OOB  Transfers   Equipment used: Rolling walker (2 wheeled) Transfers: Sit to/from Stand Sit to Stand: Min assist         General transfer comment: cues for UE/LE placement    Balance                                            ADL Overall ADL's : Needs assistance/impaired     Grooming: Set up;Sitting   Upper Body Bathing: Set up;Sitting   Lower Body Bathing: Moderate assistance;Sit to/from stand   Upper Body Dressing : Set up;Sitting   Lower Body Dressing: Maximal assistance;Sit to/from stand   Toilet Transfer: Minimal assistance;Stand-pivot;BSC   Toileting- Clothing Manipulation and Hygiene: Sit to/from stand;Min guard         General ADL Comments: performed ADL from recliner and SPT to 3:1 commode.  Pt needed cues for safety--tends to move quickly and did not keep body between walker at all  times: mod cueing.  Discussed sponge bathing until home health can work on this with her     Vision     Perception     Praxis      Pertinent Vitals/Pain Pain Score: 5  Pain Location: L knee Pain Descriptors / Indicators: Aching Pain Intervention(s): Limited activity within patient's tolerance;Monitored during session;Repositioned;Premedicated before session     Hand Dominance Right   Extremity/Trunk Assessment Upper Extremity Assessment Upper Extremity Assessment: Overall WFL for tasks assessed           Communication Communication Communication: No difficulties   Cognition Arousal/Alertness:  (sleepy at end of session) Behavior During Therapy: WFL for tasks assessed/performed Overall Cognitive Status: Within Functional Limits for tasks assessed                     General Comments       Exercises       Shoulder Instructions      Home Living Family/patient expects to be discharged to:: Private residence Living Arrangements: Spouse/significant other;Children Available Help at Discharge: Family Type of Home: House             Bathroom Shower/Tub: Tub/shower unit Shower/tub characteristics: Curtain Biochemist, clinical: Standard     Home Equipment: Environmental consultant -  2 wheels;Walker - 4 wheels;Shower seat;Bedside commode          Prior Functioning/Environment Level of Independence: Independent             OT Diagnosis: Generalized weakness   OT Problem List: Decreased strength;Decreased activity tolerance;Decreased safety awareness;Decreased knowledge of use of DME or AE;Pain   OT Treatment/Interventions: Self-care/ADL training;DME and/or AE instruction;Patient/family education;Balance training    OT Goals(Current goals can be found in the care plan section) Acute Rehab OT Goals Patient Stated Goal: Resume previous lifestyle with decreased pain OT Goal Formulation: With patient Time For Goal Achievement: 10/01/14 Potential to Achieve Goals:  Good ADL Goals Pt Will Perform Grooming: with supervision;standing Pt Will Transfer to Toilet: with min guard assist;ambulating;bedside commode (with no more than 2 vcs for safety) Pt Will Perform Toileting - Clothing Manipulation and hygiene: with supervision;sit to/from stand  OT Frequency: Min 2X/week   Barriers to D/C:            Co-evaluation              End of Session    Activity Tolerance: Patient limited by fatigue Patient left: in chair;with call bell/phone within reach   Time: 5830-9407 OT Time Calculation (min): 33 min Charges:  OT General Charges $OT Visit: 1 Procedure OT Evaluation $Initial OT Evaluation Tier I: 1 Procedure OT Treatments $Self Care/Home Management : 8-22 mins G-Codes: OT G-codes **NOT FOR INPATIENT CLASS** Functional Assessment Tool Used: clinical observation and judgment Functional Limitation: Self care Self Care Current Status (W8088): At least 80 percent but less than 100 percent impaired, limited or restricted Self Care Goal Status (P1031): At least 1 percent but less than 20 percent impaired, limited or restricted  [facility] 09/24/2014, 11:51 AM  [name], OTR/L [phone] 09/24/2014

## 2014-09-24 NOTE — Progress Notes (Signed)
Physical Therapy Treatment Patient Details Name: Leslie Benton MRN: [id number] DOB: [birth year] Today's Date: 09/24/2014    History of Present Illness L UKR    PT Comments    POD # 1 pm session  Attempted stair training as pt has one step from the garage then 3 steps into the kitchen. Pt continues to demonstrate impaired cognition and difficulty processing/performing task safely.  Near fall on stairs.   Follow Up Recommendations  Home health PT  pt states she will have "some" help at home but spouse still works and son works with him. At this point, pt will need 24/7 supervision/assist.   Pt would benefit from [facility]    Equipment Recommendations  None recommended by PT    Recommendations for Other Services       Precautions / Restrictions Precautions Precautions: Knee;Fall Precaution Comments: instructed pt on use for amb and stairs Required Braces or Orthoses: Knee Immobilizer - Left Knee Immobilizer - Left: Discontinue once straight leg raise with < 10 degree lag Restrictions Weight Bearing Restrictions: No Other Position/Activity Restrictions: WBAT    Mobility  Bed Mobility Overal bed mobility: Needs Assistance Bed Mobility: Supine to Sit     Supine to sit: Mod assist     General bed mobility comments: Pt OOB in recliner  Transfers Overall transfer level: Needs assistance Equipment used: Rolling walker (2 wheeled) Transfers: Sit to/from Stand Sit to Stand: Min assist         General transfer comment: 75% VC's on proper tech and hand placement.  100% VC's on turn completion and walker completion prior to sit. Pt tends to "plop".   Ambulation/Gait Ambulation/Gait assistance: Min assist Ambulation Distance (Feet): 18 Feet Assistive device: Rolling walker (2 wheeled) Gait Pattern/deviations: Step-to pattern;Decreased stance time - left;Decreased step length - right;Decreased step length - left;Trunk flexed Gait velocity: decreased   General Gait  Details: 75% VC's on proper sequencing, proper walker to self distance and safety with walker advancement.     Stairs Stairs: Yes Stairs assistance: Max assist Stair Management: One rail Right;Step to pattern;Forwards;With crutches Number of Stairs: 5 General stair comments: performed one step forward with RW with 75% VC's on proper tech and walker placement.  Performed 4 steps with one R rail and one L crutch with 100% VC's on proper tech and safety.  Pt required + 2 assist and had X 1 LOB/near fall.  Biggest barrier was pt's current cognition level and difficulty processing thus performing task at hand.    Wheelchair Mobility    Modified Rankin (Stroke Patients Only)       Balance                                    Cognition Arousal/Alertness: Awake/alert Behavior During Therapy:  (odd) Overall Cognitive Status: Impaired/Different from baseline Area of Impairment: Following commands;Safety/judgement;Awareness;Problem solving         Safety/Judgement: Decreased awareness of safety;Decreased awareness of deficits   Problem Solving: Slow processing;Requires verbal cues      Exercises      General Comments        Pertinent Vitals/Pain Pain Assessment: 0-10 Pain Score: 5  Pain Location: L knee Pain Descriptors / Indicators: Aching;Sore Pain Intervention(s): Monitored during session;Premedicated before session;Repositioned;Ice applied    Home Living  Prior Function            PT Goals (current goals can now be found in the care plan section) Progress towards PT goals: Progressing toward goals    Frequency  7X/week    PT Plan      Co-evaluation             End of Session Equipment Utilized During Treatment: Gait belt;Left knee immobilizer Activity Tolerance: Other (comment) (limited by impaired cognition) Patient left: in chair;with call bell/phone within reach     Time: 6825-7493 PT Time Calculation  (min) (ACUTE ONLY): 27 min  Charges:  $Gait Training: 8-22 mins $Therapeutic Activity: 8-22 mins                    G Codes:      Rica Koyanagi  PTA [facility]  Acute  Rehab Pager      [phone]

## 2014-09-24 NOTE — Discharge Summary (Signed)
Physician Discharge Summary   Patient ID: Leslie Benton MRN: [id number] DOB/AGE: October 25, 1940 74 y.o.  Admit date: 09/23/2014 Discharge date: 09-25-2014  Primary Diagnosis:  Medial compartment osteoarthritis, Left knee  Admission Diagnoses:  Past Medical History  Diagnosis Date  . COLONIC POLYPS, HX OF 02/07/2008  . DIABETES MELLITUS, TYPE II 10/27/2009  . GERD 10/14/2006  . HYPERLIPIDEMIA 10/14/2006  . HYPOTHYROIDISM 10/14/2006  . LOW BACK PAIN 08/04/2007  . Obesity   . DEGENERATIVE JOINT DISEASE 10/14/2006    03/31/11: Pt states arthritis in her hip.  . Anxiety   . Depression   . HYPERTENSION 10/14/2006    off bp meds now  . Anemia    Discharge Diagnoses:   Principal Problem:   OA (osteoarthritis) of knee  Estimated body mass index is 36.26 kg/(m^2) as calculated from the following:   Height as of this encounter: 5' 3.5" (1.613 m).   Weight as of this encounter: 94.348 kg (208 lb).  Procedure:  Procedure(s) (LRB): LEFT UNICOMPARTMENTAL KNEE (Left)   Consults: None  HPI: Leslie Benton is a 74 y.o. female, who has  significant isolated medial compartment arthritis of the Left knee. She has had nonoperative management including injections. She has had  cortisone and viscous supplements. Unfortunately, the pain persists.  Radiograph showed isolated medial compartment bone-on-bone arthritis  with normal-appearing patellofemoral and lateral compartments. She  presents now for left knee unicompartmental arthroplasty.  Laboratory Data: Admission on 09/23/2014  Component Date Value Ref Range Status  . ABO/RH(D) 09/23/2014 O POS   Final  . Antibody Screen 09/23/2014 NEG   Final  . Sample Expiration 09/23/2014 09/26/2014   Final  . Glucose-Capillary 09/23/2014 155* 65 - 99 mg/dL Final  . Comment 1 09/23/2014 Notify RN   Final  . Glucose-Capillary 09/23/2014 119* 65 - 99 mg/dL Final  . Comment 1 09/23/2014 Notify RN   Final  . Comment 2 09/23/2014 Document in Chart   Final    . WBC 09/24/2014 9.6  4.0 - 10.5 K/uL Final  . RBC 09/24/2014 4.97  3.87 - 5.11 MIL/uL Final  . Hemoglobin 09/24/2014 14.0  12.0 - 15.0 g/dL Final  . HCT 09/24/2014 43.8  36.0 - 46.0 % Final  . MCV 09/24/2014 88.1  78.0 - 100.0 fL Final  . MCH 09/24/2014 28.2  26.0 - 34.0 pg Final  . MCHC 09/24/2014 32.0  30.0 - 36.0 g/dL Final  . RDW 09/24/2014 21.8* 11.5 - 15.5 % Final  . Platelets 09/24/2014 254  150 - 400 K/uL Final  . Sodium 09/24/2014 141  135 - 145 mmol/L Final  . Potassium 09/24/2014 3.8  3.5 - 5.1 mmol/L Final  . Chloride 09/24/2014 107  101 - 111 mmol/L Final  . CO2 09/24/2014 23  22 - 32 mmol/L Final  . Glucose, Bld 09/24/2014 162* 65 - 99 mg/dL Final  . BUN 09/24/2014 20  6 - 20 mg/dL Final  . Creatinine, Ser 09/24/2014 0.94  0.44 - 1.00 mg/dL Final  . Calcium 09/24/2014 8.7* 8.9 - 10.3 mg/dL Final  . GFR calc non Af Amer 09/24/2014 58* >60 mL/min Final  . GFR calc Af Amer 09/24/2014 >60  >60 mL/min Final   Comment: (NOTE) The eGFR has been calculated using the CKD EPI equation. This calculation has not been validated in all clinical situations. eGFR's persistently <60 mL/min signify possible Chronic Kidney Disease.   . Anion gap 09/24/2014 11  5 - 15 Final  . WBC 09/25/2014 6.6  4.0 -  10.5 K/uL Final  . RBC 09/25/2014 4.67  3.87 - 5.11 MIL/uL Final  . Hemoglobin 09/25/2014 13.2  12.0 - 15.0 g/dL Final  . HCT 09/25/2014 41.4  36.0 - 46.0 % Final  . MCV 09/25/2014 88.7  78.0 - 100.0 fL Final  . MCH 09/25/2014 28.3  26.0 - 34.0 pg Final  . MCHC 09/25/2014 31.9  30.0 - 36.0 g/dL Final  . RDW 09/25/2014 21.4* 11.5 - 15.5 % Final  . Platelets 09/25/2014 199  150 - 400 K/uL Final  . Sodium 09/25/2014 138  135 - 145 mmol/L Final  . Potassium 09/25/2014 3.7  3.5 - 5.1 mmol/L Final  . Chloride 09/25/2014 102  101 - 111 mmol/L Final  . CO2 09/25/2014 26  22 - 32 mmol/L Final  . Glucose, Bld 09/25/2014 183* 65 - 99 mg/dL Final  . BUN 09/25/2014 10  6 - 20 mg/dL Final  .  Creatinine, Ser 09/25/2014 0.75  0.44 - 1.00 mg/dL Final  . Calcium 09/25/2014 8.6* 8.9 - 10.3 mg/dL Final  . GFR calc non Af Amer 09/25/2014 >60  >60 mL/min Final  . GFR calc Af Amer 09/25/2014 >60  >60 mL/min Final   Comment: (NOTE) The eGFR has been calculated using the CKD EPI equation. This calculation has not been validated in all clinical situations. eGFR's persistently <60 mL/min signify possible Chronic Kidney Disease.   Georgiann Hahn gap 09/25/2014 10  5 - 15 Final  Hospital Outpatient Visit on 09/17/2014  Component Date Value Ref Range Status  . aPTT 09/17/2014 31  24 - 37 seconds Final  . WBC 09/17/2014 6.3  4.0 - 10.5 K/uL Final  . RBC 09/17/2014 5.35* 3.87 - 5.11 MIL/uL Final  . Hemoglobin 09/17/2014 15.3* 12.0 - 15.0 g/dL Final  . HCT 09/17/2014 45.7  36.0 - 46.0 % Final  . MCV 09/17/2014 85.4  78.0 - 100.0 fL Final  . MCH 09/17/2014 28.6  26.0 - 34.0 pg Final  . MCHC 09/17/2014 33.5  30.0 - 36.0 g/dL Final  . RDW 09/17/2014 23.7* 11.5 - 15.5 % Final  . Platelets 09/17/2014 270  150 - 400 K/uL Final   Comment: SPECIMEN CHECKED FOR CLOTS REPEATED TO VERIFY   . Sodium 09/17/2014 141  135 - 145 mmol/L Final  . Potassium 09/17/2014 4.1  3.5 - 5.1 mmol/L Final  . Chloride 09/17/2014 104  101 - 111 mmol/L Final  . CO2 09/17/2014 25  22 - 32 mmol/L Final  . Glucose, Bld 09/17/2014 153* 65 - 99 mg/dL Final  . BUN 09/17/2014 21* 6 - 20 mg/dL Final  . Creatinine, Ser 09/17/2014 1.21* 0.44 - 1.00 mg/dL Final  . Calcium 09/17/2014 9.2  8.9 - 10.3 mg/dL Final  . Total Protein 09/17/2014 7.5  6.5 - 8.1 g/dL Final  . Albumin 09/17/2014 4.4  3.5 - 5.0 g/dL Final  . AST 09/17/2014 19  15 - 41 U/L Final  . ALT 09/17/2014 21  14 - 54 U/L Final  . Alkaline Phosphatase 09/17/2014 86  38 - 126 U/L Final  . Total Bilirubin 09/17/2014 0.4  0.3 - 1.2 mg/dL Final  . GFR calc non Af Amer 09/17/2014 43* >60 mL/min Final  . GFR calc Af Amer 09/17/2014 49* >60 mL/min Final   Comment: (NOTE) The  eGFR has been calculated using the CKD EPI equation. This calculation has not been validated in all clinical situations. eGFR's persistently <60 mL/min signify possible Chronic Kidney Disease.   . Anion gap 09/17/2014 12  5 - 15 Final  . Prothrombin Time 09/17/2014 12.8  11.6 - 15.2 seconds Final  . INR 09/17/2014 0.94  0.00 - 1.49 Final  . Color, Urine 09/17/2014 YELLOW  YELLOW Final  . APPearance 09/17/2014 CLEAR  CLEAR Final  . Specific Gravity, Urine 09/17/2014 1.025  1.005 - 1.030 Final  . pH 09/17/2014 5.5  5.0 - 8.0 Final  . Glucose, UA 09/17/2014 NEGATIVE  NEGATIVE mg/dL Final  . Hgb urine dipstick 09/17/2014 NEGATIVE  NEGATIVE Final  . Bilirubin Urine 09/17/2014 NEGATIVE  NEGATIVE Final  . Ketones, ur 09/17/2014 NEGATIVE  NEGATIVE mg/dL Final  . Protein, ur 09/17/2014 NEGATIVE  NEGATIVE mg/dL Final  . Urobilinogen, UA 09/17/2014 0.2  0.0 - 1.0 mg/dL Final  . Nitrite 09/17/2014 NEGATIVE  NEGATIVE Final  . Leukocytes, UA 09/17/2014 NEGATIVE  NEGATIVE Final   MICROSCOPIC NOT DONE ON URINES WITH NEGATIVE PROTEIN, BLOOD, LEUKOCYTES, NITRITE, OR GLUCOSE <1000 mg/dL.  Marland Kitchen MRSA, PCR 09/17/2014 NEGATIVE  NEGATIVE Final  . Staphylococcus aureus 09/17/2014 POSITIVE* NEGATIVE Final   Comment:        The Xpert SA Assay (FDA approved for NASAL specimens in patients over 68 years of age), is one component of a comprehensive surveillance program.  Test performance has been validated by [facility] for patients greater than or equal to 48 year old. It is not intended to diagnose infection nor to guide or monitor treatment.   Lab on 08/26/2014  Component Date Value Ref Range Status  . TSH 08/26/2014 6.93* 0.35 - 4.50 uIU/mL Final  . WBC 08/26/2014 6.0  4.0 - 10.5 K/uL Final  . RBC 08/26/2014 5.25* 3.87 - 5.11 Mil/uL Final  . Hemoglobin 08/26/2014 13.8  12.0 - 15.0 g/dL Final  . HCT 08/26/2014 42.2  36.0 - 46.0 % Final  . MCV 08/26/2014 80.3  78.0 - 100.0 fl Final  . MCHC 08/26/2014  32.6  30.0 - 36.0 g/dL Final  . RDW 08/26/2014 35.2* 11.5 - 15.5 % Final  . Platelets 08/26/2014 344.0  150.0 - 400.0 K/uL Final  . Neutrophils Relative % 08/26/2014 55.1  43.0 - 77.0 % Final  . Lymphocytes Relative 08/26/2014 36.7  12.0 - 46.0 % Final  . Monocytes Relative 08/26/2014 6.3  3.0 - 12.0 % Final  . Eosinophils Relative 08/26/2014 1.5  0.0 - 5.0 % Final  . Basophils Relative 08/26/2014 0.4  0.0 - 3.0 % Final  . Neutro Abs 08/26/2014 3.3  1.4 - 7.7 K/uL Final  . Lymphs Abs 08/26/2014 2.2  0.7 - 4.0 K/uL Final  . Monocytes Absolute 08/26/2014 0.4  0.1 - 1.0 K/uL Final  . Eosinophils Absolute 08/26/2014 0.1  0.0 - 0.7 K/uL Final  . Basophils Absolute 08/26/2014 0.0  0.0 - 0.1 K/uL Final  Appointment on 08/05/2014  Component Date Value Ref Range Status  . WBC 08/05/2014 5.3  3.9 - 10.0 10e3/uL Final  . RBC 08/05/2014 5.39* 3.70 - 5.32 10e6/uL Final  . HGB 08/05/2014 12.6  11.6 - 15.9 g/dL Final  . HCT 08/05/2014 41.0  34.8 - 46.6 % Final  . MCV 08/05/2014 76* 81 - 101 fL Final  . MCH 08/05/2014 23.4* 26.0 - 34.0 pg Final  . MCHC 08/05/2014 30.7* 32.0 - 36.0 g/dL Final  . RDW 08/05/2014 32.4* 11.1 - 15.7 % Final  . Platelets 08/05/2014 299  145 - 400 10e3/uL Final  . NEUT# 08/05/2014 2.6  1.5 - 6.5 10e3/uL Final  . LYMPH# 08/05/2014 2.2  0.9 - 3.3 10e3/uL Final  .  MONO# 08/05/2014 0.4  0.1 - 0.9 10e3/uL Final  . Eosinophils Absolute 08/05/2014 0.1  0.0 - 0.5 10e3/uL Final  . BASO# 08/05/2014 0.1  0.0 - 0.2 10e3/uL Final  . NEUT% 08/05/2014 49.1  39.6 - 80.0 % Final  . LYMPH% 08/05/2014 40.6  14.0 - 48.0 % Final  . MONO% 08/05/2014 8.1  0.0 - 13.0 % Final  . EOS% 08/05/2014 1.1  0.0 - 7.0 % Final  . BASO% 08/05/2014 1.1  0.0 - 2.0 % Final  . Ferritin 08/05/2014 81  9 - 269 ng/ml Final  . Iron 08/05/2014 54  41 - 142 ug/dL Final  . TIBC 08/05/2014 334  236 - 444 ug/dL Final  . UIBC 08/05/2014 280  120 - 384 ug/dL Final  . %SAT 08/05/2014 16* 21 - 57 % Final     X-Rays:No  results found.  EKG: Orders placed or performed during the hospital encounter of 07/02/14  . ED EKG  . ED EKG  . EKG     Hospital Course: Leslie Benton is a 74 y.o. who was admitted to [facility]. They were brought to the operating room on 09/23/2014 and underwent Procedure(s): LEFT UNICOMPARTMENTAL KNEE.  Patient tolerated the procedure well and was later transferred to the recovery room and then to the orthopaedic floor for postoperative care.  They were given PO and IV analgesics for pain control following their surgery.  They were given 24 hours of postoperative antibiotics of      Anti-infectives    Start     Dose/Rate Route Frequency Ordered Stop   09/23/14 2100  vancomycin (VANCOCIN) IVPB 1000 mg/200 mL premix     1,000 mg 200 mL/hr over 60 Minutes Intravenous Every 12 hours 09/23/14 1229 09/23/14 2137   09/23/14 1600  clindamycin (CLEOCIN) capsule 300 mg     300 mg Oral 3 times daily 09/23/14 1229     09/23/14 0627  vancomycin (VANCOCIN) IVPB 1000 mg/200 mL premix     1,000 mg 200 mL/hr over 60 Minutes Intravenous On call to O.R. 09/23/14 [id number] 09/23/14 0931     and started on DVT prophylaxis in the form of Eliquis.   PT and OT were ordered for total joint protocol.  Discharge planning consulted to help with postop disposition and equipment needs.  Patient had a tough night on the evening of surgery.  They started to get up OOB with therapy on day one. Hemovac drain was pulled without difficulty. She was initially setup to go home later that afternoon on POD 1 but progressed very slow with therapy and was not ready to go home.  Social worker was consulted to look into a SNF for the patient and she was kept overnight again to receive more therapy.  Continued to work with therapy into day two.  Dressing was changed on day two and the incision was healing well and looked very good.   Patient was seen in rounds on POD 2 by Dr. Wynelle Link  and was ready to go tot he SNF.  Discharge  to SNF if insurance approves transfer Diet - Cardiac diet and Diabetic diet Follow up - in 2 weeks Activity - WBAT Disposition - Skilled nursing facility Condition Upon Discharge - Stable D/C Meds - See DC Summary DVT Prophylaxis - Eliquis for ten days and then Aspirin.       Discharge Instructions    Call MD / Call 911    Complete by:  As directed  If you experience chest pain or shortness of breath, CALL 911 and be transported to the hospital emergency room.  If you develope a fever above 101 F, pus (white drainage) or increased drainage or redness at the wound, or calf pain, call your surgeon's office.     Change dressing    Complete by:  As directed   May start changing dressing tomorrow, Wednesday 09/25/2014. Change dressing daily with sterile 4 x 4 inch gauze dressing and apply TED hose. Do not submerge the incision under water.     Constipation Prevention    Complete by:  As directed   Drink plenty of fluids.  Prune juice may be helpful.  You may use a stool softener, such as Colace (over the counter) 100 mg twice a day.  Use MiraLax (over the counter) for constipation as needed.     Diet - low sodium heart healthy    Complete by:  As directed      Diet Carb Modified    Complete by:  As directed      Discharge instructions    Complete by:  As directed   Pick up stool softner and laxative for home use following surgery while on pain medications. Do not submerge incision under water. Please use good hand washing techniques while changing dressing each day. May shower starting three days after surgery. Please use a clean towel to pat the incision dry following showers. Continue to use ice for pain and swelling after surgery. Do not use any lotions or creams on the incision until instructed by your surgeon.  Take Eliquis (apixaban) twice a day for ten days and then discontinue the Eliquis and start an 81 mg Baby Aspirin daily for three more weeks.  Postoperative Constipation  Protocol  Constipation - defined medically as fewer than three stools per week and severe constipation as less than one stool per week.  One of the most common issues patients have following surgery is constipation.  Even if you have a regular bowel pattern at home, your normal regimen is likely to be disrupted due to multiple reasons following surgery.  Combination of anesthesia, postoperative narcotics, change in appetite and fluid intake all can affect your bowels.  In order to avoid complications following surgery, here are some recommendations in order to help you during your recovery period.  Colace (docusate) - Pick up an over-the-counter form of Colace or another stool softener and take twice a day as long as you are requiring postoperative pain medications.  Take with a full glass of water daily.  If you experience loose stools or diarrhea, hold the colace until you stool forms back up.  If your symptoms do not get better within 1 week or if they get worse, check with your doctor.  Dulcolax (bisacodyl) - Pick up over-the-counter and take as directed by the product packaging as needed to assist with the movement of your bowels.  Take with a full glass of water.  Use this product as needed if not relieved by Colace only.   MiraLax (polyethylene glycol) - Pick up over-the-counter to have on hand.  MiraLax is a solution that will increase the amount of water in your bowels to assist with bowel movements.  Take as directed and can mix with a glass of water, juice, soda, coffee, or tea.  Take if you go more than two days without a movement. Do not use MiraLax more than once per day. Call your doctor if you are still  constipated or irregular after using this medication for 7 days in a row.  If you continue to have problems with postoperative constipation, please contact the office for further assistance and recommendations.  If you experience "the worst abdominal pain ever" or develop nausea or  vomiting, please contact the office immediatly for further recommendations for treatment.  When discharged from the skilled rehab facility, please have the facility set up the patient's [place] prior to being released.  Please make sure this gets set up prior to release in order to avoid any lapse of therapy following the rehab stay.  Also provide the patient with their medications at time of release from the facility to include their pain medication, the muscle relaxants, and their blood thinner medication.  If the patient is still at the rehab facility at time of follow up appointment, please also assist the patient in arranging follow up appointment in our office and any transportation needs. ICE to the affected knee or hip every three hours for 30 minutes at a time and then as needed for pain and swelling.      Do not put a pillow under the knee. Place it under the heel.    Complete by:  As directed      Do not sit on low chairs, stoools or toilet seats, as it may be difficult to get up from low surfaces    Complete by:  As directed      Driving restrictions    Complete by:  As directed   No driving until released by the physician.     Increase activity slowly as tolerated    Complete by:  As directed      Lifting restrictions    Complete by:  As directed   No lifting until released by the physician.     Patient may shower    Complete by:  As directed   You may shower without a dressing once there is no drainage.  Do not wash over the wound.  If drainage remains, do not shower until drainage stops.     TED hose    Complete by:  As directed   Use stockings (TED hose) for 3 weeks on both leg(s).  You may remove them at night for sleeping.     Weight bearing as tolerated    Complete by:  As directed   Laterality:  left  Extremity:  Lower            Medication List    STOP taking these medications        phentermine 37.5 MG capsule     PHENTERMINE HCL PO       TAKE these medications        accu-chek multiclix lancets  USE TO CHECK BLOOD SUGAR DAILY AND PRN     acetaminophen 500 MG tablet  Commonly known as:  TYLENOL  Take 500 mg by mouth every 6 (six) hours as needed for moderate pain or headache.     apixaban 2.5 MG Tabs tablet  Commonly known as:  ELIQUIS  Take 1 tablet (2.5 mg total) by mouth every 12 (twelve) hours. Take for ten days and then discontinue the Apixaban an then take an 81 mg Baby Aspirin daily for three more weeks.     busPIRone 15 MG tablet  Commonly known as:  BUSPAR  Take 1 tablet (15 mg total) by mouth 3 (three) times daily.     clindamycin 300 MG  capsule  Commonly known as:  CLEOCIN  Take 1 capsule (300 mg total) by mouth 3 (three) times daily.     cloNIDine 0.1 MG tablet  Commonly known as:  CATAPRES  Take 1 tablet (0.1 mg total) by mouth 2 (two) times daily as needed (prn sbp>160).     docusate sodium 100 MG capsule  Commonly known as:  COLACE  Take 1 capsule (100 mg total) by mouth 2 (two) times daily.     glimepiride 4 MG tablet  Commonly known as:  AMARYL  TAKE 1/2 A TABLET BY MOUTH DAILY WITH BREAKFAST     glucose blood test strip  Commonly known as:  ACCU-CHEK AVIVA  USE TO CHECK BLOOD SUGAR DAILY AND PRN     levothyroxine 150 MCG tablet  Commonly known as:  SYNTHROID, LEVOTHROID  Take 1 tablet (150 mcg total) by mouth daily.     metFORMIN 1000 MG tablet  Commonly known as:  GLUCOPHAGE  Take 1 tablet (1,000 mg total) by mouth daily with breakfast.     methocarbamol 500 MG tablet  Commonly known as:  ROBAXIN  Take 1 tablet (500 mg total) by mouth every 6 (six) hours as needed for muscle spasms.     metoCLOPramide 5 MG tablet  Commonly known as:  REGLAN  Take 1 tablet (5 mg total) by mouth every 8 (eight) hours as needed for nausea (if ondansetron (ZOFRAN) ineffective.).     mupirocin ointment 2 %  Commonly known as:  BACTROBAN     omeprazole 20 MG capsule  Commonly known as:   PRILOSEC  take 1 capsule by mouth once daily     ondansetron 4 MG tablet  Commonly known as:  ZOFRAN  Take 1 tablet (4 mg total) by mouth every 6 (six) hours as needed for nausea.     oxyCODONE 5 MG immediate release tablet  Commonly known as:  Oxy IR/ROXICODONE  Take 1-2 tablets (5-10 mg total) by mouth every 3 (three) hours as needed for moderate pain, severe pain or breakthrough pain.     polyethylene glycol packet  Commonly known as:  MIRALAX / GLYCOLAX  Take 17 g by mouth daily as needed for mild constipation.     propranolol 20 MG tablet  Commonly known as:  INDERAL  Take 1 tablet (20 mg total) by mouth 2 (two) times daily.     sodium chloride 2 % ophthalmic solution  Commonly known as:  MURO 128  Place 1-2 drops into both eyes daily as needed (dry eye).     UNABLE TO FIND  Benadryl es cream applying to left upper arm insect bite 4 times per day       Follow-up Information    Follow up with Gearlean Alf, MD. Schedule an appointment as soon as possible for a visit on 10/08/2014.   Specialty:  Orthopedic Surgery   Why:  Call office at [phone] to setup appointment with Dr. Wynelle Link on Tuesday 6.21.2016.   Contact information:   [address] [place] [address] [phone]       Follow up with [facility].   Why:  home health physical therapy   Contact information:   [address] [place] [address] [phone]       Signed: Arlee Muslim, PA-C Orthopaedic Surgery 09/25/2014, 10:58 AM

## 2014-09-24 NOTE — Discharge Instructions (Addendum)
° °Dr. Frank Aluisio °Total Joint Specialist °[facility] °[address] °[place], [place] [address] °[phone] ° °UNI KNEE REPLACEMENT POSTOPERATIVE DIRECTIONS ° ° °Knee Rehabilitation, Guidelines Following Surgery  °Results after knee surgery are often greatly improved when you follow the exercise, range of motion and muscle strengthening exercises prescribed by your doctor. Safety measures are also important to protect the knee from further injury. Any time any of these exercises cause you to have increased pain or swelling in your knee joint, decrease the amount until you are comfortable again and slowly increase them. If you have problems or questions, call your caregiver or physical therapist for advice.  ° °HOME CARE INSTRUCTIONS  °Remove items at home which could result in a fall. This includes throw rugs or furniture in walking pathways.  °· ICE to the affected knee every three hours for 30 minutes at a time and then as needed for pain and swelling.  Continue to use ice on the knee for pain and swelling from surgery. You may notice swelling that will progress down to the foot and ankle.  This is normal after surgery.  Elevate the leg when you are not up walking on it.   °· Continue to use the breathing machine which will help keep your temperature down.  It is common for your temperature to cycle up and down following surgery, especially at night when you are not up moving around and exerting yourself.  The breathing machine keeps your lungs expanded and your temperature down. °· Do not place pillow under knee, focus on keeping the knee straight while resting ° °DIET °You may resume your previous home diet once your are discharged from the hospital. ° °DRESSING / WOUND CARE / SHOWERING °You may shower 3 days after surgery, but keep the wounds dry during showering.  You may use an occlusive plastic wrap (Press'n Seal for example), NO SOAKING/SUBMERGING IN THE BATHTUB.  If the  bandage gets wet, change with a clean dry gauze.  If the incision gets wet, pat the wound dry with a clean towel. °You may start showering once you are discharged home but do not submerge the incision under water. Just pat the incision dry and apply a dry gauze dressing on daily. °Change the surgical dressing daily and reapply a dry dressing each time. ° °ACTIVITY °Walk with your walker as instructed. °Use walker as long as suggested by your caregivers. °Avoid periods of inactivity such as sitting longer than an hour when not asleep. This helps prevent blood clots.  °You may resume a sexual relationship in one month or when given the OK by your doctor.  °You may return to work once you are cleared by your doctor.  °Do not drive a car for 6 weeks or until released by you surgeon.  °Do not drive while taking narcotics. ° °WEIGHT BEARING °Weight bearing as tolerated with assist device (walker, cane, etc) as directed, use it as long as suggested by your surgeon or therapist, typically at least 4-6 weeks. ° °POSTOPERATIVE CONSTIPATION PROTOCOL °Constipation - defined medically as fewer than three stools per week and severe constipation as less than one stool per week. ° °One of the most common issues patients have following surgery is constipation.  Even if you have a regular bowel pattern at home, your normal regimen is likely to be disrupted due to multiple reasons following surgery.  Combination of anesthesia, postoperative narcotics, change in appetite and fluid intake all can affect your bowels.    In order to avoid complications following surgery, here are some recommendations in order to help you during your recovery period.  Colace (docusate) - Pick up an over-the-counter form of Colace or another stool softener and take twice a day as long as you are requiring postoperative pain medications.  Take with a full glass of water daily.  If you experience loose stools or diarrhea, hold the colace until you stool forms  back up.  If your symptoms do not get better within 1 week or if they get worse, check with your doctor.  Dulcolax (bisacodyl) - Pick up over-the-counter and take as directed by the product packaging as needed to assist with the movement of your bowels.  Take with a full glass of water.  Use this product as needed if not relieved by Colace only.   MiraLax (polyethylene glycol) - Pick up over-the-counter to have on hand.  MiraLax is a solution that will increase the amount of water in your bowels to assist with bowel movements.  Take as directed and can mix with a glass of water, juice, soda, coffee, or tea.  Take if you go more than two days without a movement. Do not use MiraLax more than once per day. Call your doctor if you are still constipated or irregular after using this medication for 7 days in a row.  If you continue to have problems with postoperative constipation, please contact the office for further assistance and recommendations.  If you experience "the worst abdominal pain ever" or develop nausea or vomiting, please contact the office immediatly for further recommendations for treatment.  ITCHING  If you experience itching with your medications, try taking only a single pain pill, or even half a pain pill at a time.  You can also use Benadryl over the counter for itching or also to help with sleep.   TED HOSE STOCKINGS Wear the elastic stockings on both legs for three weeks following surgery during the day but you may remove then at night for sleeping.  MEDICATIONS See your medication summary on the After Visit Summary that the nursing staff will review with you prior to discharge.  You may have some home medications which will be placed on hold until you complete the course of blood thinner medication.  It is important for you to complete the blood thinner medication as prescribed by your surgeon.  Continue your approved medications as instructed at time of  discharge.  PRECAUTIONS If you experience chest pain or shortness of breath - call 911 immediately for transfer to the hospital emergency department.  If you develop a fever greater that 101 F, purulent drainage from wound, increased redness or drainage from wound, foul odor from the wound/dressing, or calf pain - CONTACT YOUR SURGEON.                                                   FOLLOW-UP APPOINTMENTS Make sure you keep all of your appointments after your operation with your surgeon and caregivers. You should call the office at the above phone number and make an appointment for approximately two weeks after the date of your surgery or on the date instructed by your surgeon outlined in the "After Visit Summary".  RANGE OF MOTION AND STRENGTHENING EXERCISES  Rehabilitation of the knee is important following a knee injury or an  operation. After just a few days of immobilization, the muscles of the thigh which control the knee become weakened and shrink (atrophy). Knee exercises are designed to build up the tone and strength of the thigh muscles and to improve knee motion. Often times heat used for twenty to thirty minutes before working out will loosen up your tissues and help with improving the range of motion but do not use heat for the first two weeks following surgery. These exercises can be done on a training (exercise) mat, on the floor, on a table or on a bed. Use what ever works the best and is most comfortable for you Knee exercises include:  Leg Lifts - While your knee is still immobilized in a splint or cast, you can do straight leg raises. Lift the leg to 60 degrees, hold for 3 sec, and slowly lower the leg. Repeat 10-20 times 2-3 times daily. Perform this exercise against resistance later as your knee gets better.  Quad and Hamstring Sets - Tighten up the muscle on the front of the thigh (Quad) and hold for 5-10 sec. Repeat this 10-20 times hourly. Hamstring sets are done by pushing the  foot backward against an object and holding for 5-10 sec. Repeat as with quad sets.   Leg Slides: Lying on your back, slowly slide your foot toward your buttocks, bending your knee up off the floor (only go as far as is comfortable). Then slowly slide your foot back down until your leg is flat on the floor again.  Angel Wings: Lying on your back spread your legs to the side as far apart as you can without causing discomfort.  A rehabilitation program following serious knee injuries can speed recovery and prevent re-injury in the future due to weakened muscles. Contact your doctor or a physical therapist for more information on knee rehabilitation.   IF YOU ARE TRANSFERRED TO A SKILLED REHAB FACILITY If the patient is transferred to a skilled rehab facility following release from the hospital, a list of the current medications will be sent to the facility for the patient to continue.  When discharged from the skilled rehab facility, please have the facility set up the patient's [place] prior to being released. Also, the skilled facility will be responsible for providing the patient with their medications at time of release from the facility to include their pain medication, the muscle relaxants, and their blood thinner medication. If the patient is still at the rehab facility at time of the two week follow up appointment, the skilled rehab facility will also need to assist the patient in arranging follow up appointment in our office and any transportation needs.  MAKE SURE YOU:  Understand these instructions.  Get help right away if you are not doing well or get worse.    Pick up stool softner and laxative for home use following surgery while on pain medications. Do not submerge incision under water. Please use good hand washing techniques while changing dressing each day. May shower starting three days after surgery. Please use a clean towel to pat the incision dry following  showers. Continue to use ice for pain and swelling after surgery. Do not use any lotions or creams on the incision until instructed by your surgeon.  Take Eliquis (apixaban) twice a day for a total of 10 days. After completing the Eliquis, take a baby 81 mg Aspirin daily for three more weeks.  Postoperative Constipation Protocol  Constipation - defined medically as  fewer than three stools per week and severe constipation as less than one stool per week.  One of the most common issues patients have following surgery is constipation.  Even if you have a regular bowel pattern at home, your normal regimen is likely to be disrupted due to multiple reasons following surgery.  Combination of anesthesia, postoperative narcotics, change in appetite and fluid intake all can affect your bowels.  In order to avoid complications following surgery, here are some recommendations in order to help you during your recovery period.  Colace (docusate) - Pick up an over-the-counter form of Colace or another stool softener and take twice a day as long as you are requiring postoperative pain medications.  Take with a full glass of water daily.  If you experience loose stools or diarrhea, hold the colace until you stool forms back up.  If your symptoms do not get better within 1 week or if they get worse, check with your doctor.  Dulcolax (bisacodyl) - Pick up over-the-counter and take as directed by the product packaging as needed to assist with the movement of your bowels.  Take with a full glass of water.  Use this product as needed if not relieved by Colace only.   MiraLax (polyethylene glycol) - Pick up over-the-counter to have on hand.  MiraLax is a solution that will increase the amount of water in your bowels to assist with bowel movements.  Take as directed and can mix with a glass of water, juice, soda, coffee, or tea.  Take if you go more than two days without a movement. Do not use MiraLax more than once per  day. Call your doctor if you are still constipated or irregular after using this medication for 7 days in a row.  If you continue to have problems with postoperative constipation, please contact the office for further assistance and recommendations.  If you experience "the worst abdominal pain ever" or develop nausea or vomiting, please contact the office immediatly for further recommendations for treatment.    Information on my medicine - ELIQUIS (apixaban)  This medication education was reviewed with me or my healthcare representative as part of my discharge preparation.  The pharmacist that spoke with me during my hospital stay was:  Clovis Riley, [facility]  Why was Eliquis prescribed for you? Eliquis was prescribed for you to reduce the risk of blood clots forming after orthopedic surgery.    What do You need to know about Eliquis? Take your Eliquis TWICE DAILY - one tablet in the morning and one tablet in the evening with or without food.  It would be best to take the dose about the same time each day.  If you have difficulty swallowing the tablet whole please discuss with your pharmacist how to take the medication safely.  Take Eliquis exactly as prescribed by your doctor and DO NOT stop taking Eliquis without talking to the doctor who prescribed the medication.  Stopping without other medication to take the place of Eliquis may increase your risk of developing a clot.  After discharge, you should have regular check-up appointments with your healthcare provider that is prescribing your Eliquis.  What do you do if you miss a dose? If a dose of ELIQUIS is not taken at the scheduled time, take it as soon as possible on the same day and twice-daily administration should be resumed.  The dose should not be doubled to make up for a missed dose.  Do not take more  than one tablet of ELIQUIS at the same time.  Important Safety Information A possible side effect of Eliquis is  bleeding. You should call your healthcare provider right away if you experience any of the following: ? Bleeding from an injury or your nose that does not stop. ? Unusual colored urine (red or dark brown) or unusual colored stools (red or black). ? Unusual bruising for unknown reasons. ? A serious fall or if you hit your head (even if there is no bleeding).  Some medicines may interact with Eliquis and might increase your risk of bleeding or clotting while on Eliquis. To help avoid this, consult your healthcare provider or pharmacist prior to using any new prescription or non-prescription medications, including herbals, vitamins, non-steroidal anti-inflammatory drugs (NSAIDs) and supplements.  This website has more information on Eliquis (apixaban): http://www.eliquis.com/eliquis/home

## 2014-09-24 NOTE — Progress Notes (Signed)
   Subjective: 1 Day Post-Op Procedure(s) (LRB): LEFT UNICOMPARTMENTAL KNEE (Left) Patient reports pain as moderate.   Patient seen in rounds with Dr. Wynelle Link.  She had a tough night as per patient and nursing staff.  Dr. Wynelle Link discussed with her that the procedure is typically outpatient or overnight stay.  Will see how she does with therapy this morning and this afternoon. Plan is for discharge later this afternoon following two sessions of therapy. Patient is having problems with pain in the knee, requiring pain medications Patient will be setup to go home later this afternoon and will see how she does today.  Objective: Vital signs in last 24 hours: Temp:  [97.3 F (36.3 C)-98.5 F (36.9 C)] 97.6 F (36.4 C) (06/07 0558) Pulse Rate:  [65-86] 73 (06/07 0558) Resp:  [12-18] 18 (06/07 0558) BP: (120-188)/(52-85) 166/71 mmHg (06/07 0558) SpO2:  [95 %-100 %] 97 % (06/07 0558)  Intake/Output from previous day:  Intake/Output Summary (Last 24 hours) at 09/24/14 0828 Last data filed at 09/24/14 0600  Gross per 24 hour  Intake   3435 ml  Output   2708 ml  Net    727 ml    Intake/Output this shift: UOP 750 since around [place]  Labs:  Recent Labs  09/24/14 0428  HGB 14.0    Recent Labs  09/24/14 0428  WBC 9.6  RBC 4.97  HCT 43.8  PLT 254    Recent Labs  09/24/14 0428  NA 141  K 3.8  CL 107  CO2 23  BUN 20  CREATININE 0.94  GLUCOSE 162*  CALCIUM 8.7*   No results for input(s): LABPT, INR in the last 72 hours.  EXAM: General - Patient is Alert, Appropriate and Oriented Extremity - Neurovascular intact Sensation intact distally Dorsiflexion/Plantar flexion intact Incision - clean, dry, no drainage Motor Function - intact, moving foot and toes well on exam.   Assessment/Plan: 1 Day Post-Op Procedure(s) (LRB): LEFT UNICOMPARTMENTAL KNEE (Left) Procedure(s) (LRB): LEFT UNICOMPARTMENTAL KNEE (Left) Past Medical History  Diagnosis Date  . COLONIC POLYPS,  HX OF 02/07/2008  . DIABETES MELLITUS, TYPE II 10/27/2009  . GERD 10/14/2006  . HYPERLIPIDEMIA 10/14/2006  . HYPOTHYROIDISM 10/14/2006  . LOW BACK PAIN 08/04/2007  . Obesity   . DEGENERATIVE JOINT DISEASE 10/14/2006    03/31/11: Pt states arthritis in her hip.  . Anxiety   . Depression   . HYPERTENSION 10/14/2006    off bp meds now  . Anemia    Principal Problem:   OA (osteoarthritis) of knee  Estimated body mass index is 36.26 kg/(m^2) as calculated from the following:   Height as of this encounter: 5' 3.5" (1.613 m).   Weight as of this encounter: 94.348 kg (208 lb). Advance diet Up with therapy Discharge home with home health Diet - Cardiac diet and Diabetic diet Follow up - in 2 weeks Activity - WBAT Disposition - Home Condition Upon Discharge - Pending at time of note D/C Meds - See DC Summary DVT Prophylaxis - Eliquis for ten days and then Aspirin 81 mg daily for three weeks.  Arlee Muslim, PA-C Orthopaedic Surgery 09/24/2014, 8:28 AM

## 2014-09-24 NOTE — Progress Notes (Signed)
Notified Merla Riches PA about pt's elevated blood pressure. He stated that it was okay to give 0.1mg  of clonidine at this time. Will continue to monitor.

## 2014-09-24 NOTE — Progress Notes (Signed)
CSW met with pt/spoke with spouse to assist with d/c planning. Pt is hospitalized under observation status an is unable to use medicare for assistance with ST Rehab. This has been explained to pt / spouse. Pt / spouse would like to consider a possible ST Rehab placement. SNF search has been initiated and bed offers are pending. CSW will meet with pt and speak with spouse in the am to provide bed offers. Disposition has not yet been determined.  Werner Lean LCSW [phone]

## 2014-09-24 NOTE — Care Management Note (Addendum)
Case Management Note  Patient Details  Name: Leslie Benton MRN: [id number] Date of Birth: 1941/03/06  Subjective/Objective:                   LEFT UNICOMPARTMENTAL KNEE (Left) Action/Plan:  Discharge planning Expected Discharge Date:  09/24/14               Expected Discharge Plan:  [place]  In-House Referral:     Discharge planning Services  CM Consult  Post Acute Care Choice:  Home Health Choice offered to:  Patient  DME Arranged:    DME Agency:     HH Arranged:  PT HH Agency:  [facility]  Status of Service:  Completed, signed off  Medicare Important Message Given:    Date Medicare IM Given:    Medicare IM give by:    Date Additional Medicare IM Given:    Additional Medicare Important Message give by:     If discussed at [place] of Stay Meetings, dates discussed:    Additional Comments: 09/25/14 CM notes pt to go to SNF today; CSW arranging.  CM notified [facility] rep, tim with change of plan.  No other CM needs were communicated.   CM met with pt in room to offer choice of home health agency.  Pt chooses [facility] to render HHPT.  Pt has both a rolling walker and 3n1 at home.  Husband or son will be picking her up today.  Referral emailed to [employer], Tim.  NO other CM needs were communicated.   Riely, [place], RN 09/24/2014, 10:16 AM

## 2014-09-25 DIAGNOSIS — E119 Type 2 diabetes mellitus without complications: Secondary | ICD-10-CM | POA: Diagnosis not present

## 2014-09-25 DIAGNOSIS — R278 Other lack of coordination: Secondary | ICD-10-CM | POA: Diagnosis not present

## 2014-09-25 DIAGNOSIS — Z471 Aftercare following joint replacement surgery: Secondary | ICD-10-CM | POA: Diagnosis not present

## 2014-09-25 DIAGNOSIS — M1712 Unilateral primary osteoarthritis, left knee: Secondary | ICD-10-CM | POA: Diagnosis not present

## 2014-09-25 DIAGNOSIS — E785 Hyperlipidemia, unspecified: Secondary | ICD-10-CM | POA: Diagnosis not present

## 2014-09-25 DIAGNOSIS — K219 Gastro-esophageal reflux disease without esophagitis: Secondary | ICD-10-CM | POA: Diagnosis not present

## 2014-09-25 DIAGNOSIS — Z96652 Presence of left artificial knee joint: Secondary | ICD-10-CM | POA: Diagnosis not present

## 2014-09-25 DIAGNOSIS — R2681 Unsteadiness on feet: Secondary | ICD-10-CM | POA: Diagnosis not present

## 2014-09-25 DIAGNOSIS — I10 Essential (primary) hypertension: Secondary | ICD-10-CM | POA: Diagnosis not present

## 2014-09-25 DIAGNOSIS — E780 Pure hypercholesterolemia: Secondary | ICD-10-CM | POA: Diagnosis not present

## 2014-09-25 DIAGNOSIS — M6281 Muscle weakness (generalized): Secondary | ICD-10-CM | POA: Diagnosis not present

## 2014-09-25 LAB — CBC
HEMATOCRIT: 41.4 % (ref 36.0–46.0)
Hemoglobin: 13.2 g/dL (ref 12.0–15.0)
MCH: 28.3 pg (ref 26.0–34.0)
MCHC: 31.9 g/dL (ref 30.0–36.0)
MCV: 88.7 fL (ref 78.0–100.0)
Platelets: 199 10*3/uL (ref 150–400)
RBC: 4.67 MIL/uL (ref 3.87–5.11)
RDW: 21.4 % — ABNORMAL HIGH (ref 11.5–15.5)
WBC: 6.6 10*3/uL (ref 4.0–10.5)

## 2014-09-25 LAB — BASIC METABOLIC PANEL
ANION GAP: 10 (ref 5–15)
BUN: 10 mg/dL (ref 6–20)
CALCIUM: 8.6 mg/dL — AB (ref 8.9–10.3)
CHLORIDE: 102 mmol/L (ref 101–111)
CO2: 26 mmol/L (ref 22–32)
CREATININE: 0.75 mg/dL (ref 0.44–1.00)
GFR calc Af Amer: >60 mL/min (ref >60)
GLUCOSE: 183 mg/dL — AB (ref 65–99)
POTASSIUM: 3.7 mmol/L (ref 3.5–5.1)
Sodium: 138 mmol/L (ref 135–145)

## 2014-09-25 MED ORDER — CLONIDINE HCL 0.1 MG PO TABS: 0.1000 mg | ORAL_TABLET | Freq: Two times a day (BID) | ORAL | Status: DC | PRN

## 2014-09-25 MED ORDER — ONDANSETRON HCL 4 MG PO TABS: 4.0000 mg | ORAL_TABLET | Freq: Four times a day (QID) | ORAL | Status: DC | PRN

## 2014-09-25 MED ORDER — DOCUSATE SODIUM 100 MG PO CAPS: 100.0000 mg | ORAL_CAPSULE | Freq: Two times a day (BID) | ORAL | Status: DC

## 2014-09-25 MED ORDER — METOCLOPRAMIDE HCL 5 MG PO TABS: 5.0000 mg | ORAL_TABLET | Freq: Three times a day (TID) | ORAL | Status: DC | PRN

## 2014-09-25 MED ORDER — POLYETHYLENE GLYCOL 3350 17 G PO PACK: 17.0000 g | PACK | Freq: Every day | ORAL | Status: DC | PRN

## 2014-09-25 MED ORDER — APIXABAN 2.5 MG PO TABS: 2.5000 mg | ORAL_TABLET | Freq: Two times a day (BID) | ORAL | Status: DC

## 2014-09-25 MED ORDER — METHOCARBAMOL 500 MG PO TABS: 500.0000 mg | ORAL_TABLET | Freq: Four times a day (QID) | ORAL | Status: DC | PRN

## 2014-09-25 MED ORDER — OXYCODONE HCL 5 MG PO TABS: 5.0000 mg | ORAL_TABLET | ORAL | Status: DC | PRN

## 2014-09-25 MED ORDER — LABETALOL HCL 5 MG/ML IV SOLN
10.0000 mg | Freq: Once | INTRAVENOUS | Status: AC
  Administered 2014-09-25: 10 mg via INTRAVENOUS
  Filled 2014-09-25: qty 4

## 2014-09-25 NOTE — Progress Notes (Signed)
Notified PA Brad Dixon about pt's BP of 182/71 after 0.1mg  of clonidine was given at 2200. 10mg  of Labetolol IV ordered, repeat dose in two hours if SBP > 180. Will continue to monitor.

## 2014-09-25 NOTE — Clinical Social Work Placement (Signed)
   CLINICAL SOCIAL WORK PLACEMENT  NOTE  Date:  09/25/2014  Patient Details  Name: Leslie Benton MRN: [id number] Date of Birth: Aug 29, 1940  Clinical Social Work is seeking post-discharge placement for this patient at the [place] level of care (*CSW will initial, date and re-position this form in  chart as items are completed):  Yes   Patient/family provided with [facility] Work Department's list of facilities offering this level of care within the geographic area requested by the patient (or if unable, by the patient's family).  Yes   Patient/family informed of their freedom to choose among providers that offer the needed level of care, that participate in Medicare, Medicaid or managed care program needed by the patient, have an available bed and are willing to accept the patient.  Yes   Patient/family informed of [facility]'s ownership interest in [facility] and [facility], as well as of the fact that they are under no obligation to receive care at these facilities.  PASRR submitted to EDS on 09/24/14     PASRR number received on 09/24/14     Existing PASRR number confirmed on       FL2 transmitted to all facilities in geographic area requested by pt/family on 09/25/14     FL2 transmitted to all facilities within larger geographic area on       Patient informed that his/her managed care company has contracts with or will negotiate with certain facilities, including the following:        Yes   Patient/family informed of bed offers received.  Patient chooses bed at [facility]     Physician recommends and patient chooses bed at      Patient to be transferred to [facility] on 09/25/14.  Patient to be transferred to facility by car     Patient family notified on 09/25/14 of transfer.  Name of family member notified:  SPOUSE     PHYSICIAN       Additional Comment:     _______________________________________________ Luretha Rued, LCSW 09/25/2014, 4:01 PM

## 2014-09-25 NOTE — Clinical Social Work Note (Signed)
Clinical Social Work Assessment  Patient Details  Name: Leslie Benton MRN: [id number] Date of Birth: 11-18-1940  Date of referral:  09/25/14               Reason for consult:  Facility Placement                Permission sought to share information with:    Permission granted to share information::     Name::        Agency::     Relationship::     Contact Information:     Housing/Transportation Living arrangements for the past 2 months:  Single Family Home Source of Information:  Patient, Spouse Patient Interpreter Needed:  None Criminal Activity/Legal Involvement Pertinent to Current Situation/Hospitalization:  No - Comment as needed Significant Relationships:    Lives with:  Spouse Do you feel safe going back to the place where you live?   (ST Rehab needed.) Need for family participation in patient care:  Yes (Comment)  Care giving concerns:  Pt's care cannot be managed at home following hospital d/c.   Social Worker assessment / plan:  Pt hospitalized on 09/24/14 for pre planned Left Unicompartmental Knee Replacement. Pt had planned to return home with [facility] services at d/c. MD recommended ST Rehab due to pt progressing slowly with therapy. Pt / spouse agreed with plan for rehab. Pt / spouse are aware that medicare will not cover cost of rehab due to pt being under observation status. Pt/ spouse have agreed to pay privately for rehab. SNF search initiated and bed offers provided. Pt / spouse chose [facility]  and pt was d/c there today. Nsg reviewed d/c summary, scripts, avs. Scripts included in d/c packet. D/c packet provided to pt prior to d/c. Pt was able to transport by car to SNF.  Employment status:  Retired Forensic scientist:  Medicare PT Recommendations:  [place] / Referral to community resources:  [place]  Patient/Family's Response to care:  Pt / spouse agreed to [employer].  Patient/Family's Understanding of and Emotional  Response to Diagnosis, Current Treatment, and Prognosis:  Pt / spouse were disappointed that insurance would not cover cost of rehab.  Emotional Assessment Appearance:  Appears stated age Attitude/Demeanor/Rapport:  Other (cooperative) Affect (typically observed):  Appropriate Orientation:  Oriented to Self, Oriented to Place, Oriented to  Time Alcohol / Substance use:  Not Applicable Psych involvement (Current and /or in the community):  No (Comment)  Discharge Needs  Concerns to be addressed:  Discharge Planning Concerns Readmission within the last 30 days:  No Current discharge risk:  None Barriers to Discharge:  No Barriers Identified   Avid Guillette, Randall An, LCSW 09/25/2014, 3:51 PM

## 2014-09-25 NOTE — Progress Notes (Signed)
Physical Therapy Treatment Patient Details Name: MADASYN HEATH MRN: [id number] DOB: 1940-06-28 Today's Date: 09/25/2014    History of Present Illness L UKR    PT Comments    POD # 2 pt progressing slowly so now plans to D/C to SNF.  Will update LPT. Assisted pt OOB to [facility] then amb in hallway required increased time and + 2 assist for safety.   Follow Up Recommendations  SNF (due to slow progress pt plans to D/C to SNF)     Equipment Recommendations  None recommended by PT    Recommendations for Other Services       Precautions / Restrictions Precautions Precautions: Knee;Fall Precaution Comments: instructed pt on use for amb  Required Braces or Orthoses: Knee Immobilizer - Left Knee Immobilizer - Left: Discontinue once straight leg raise with < 10 degree lag Restrictions Weight Bearing Restrictions: No Other Position/Activity Restrictions: WBAT    Mobility  Bed Mobility Overal bed mobility: Needs Assistance Bed Mobility: Supine to Sit     Supine to sit: Mod assist     General bed mobility comments: Pt OOB in recliner  Transfers Overall transfer level: Needs assistance Equipment used: Rolling walker (2 wheeled) Transfers: Sit to/from Stand Sit to Stand: Min assist         General transfer comment: 75% VC's on proper tech and hand placement.  100% VC's on turn completion and walker completion prior to sit. Pt tends to "plop".   Ambulation/Gait Ambulation/Gait assistance: Min assist Ambulation Distance (Feet): 21 Feet Assistive device: Rolling walker (2 wheeled) Gait Pattern/deviations: Step-to pattern Gait velocity: decreased   General Gait Details: 75% VC's on proper sequencing, proper walker to self distance and safety with walker advancement.     Stairs            Wheelchair Mobility    Modified Rankin (Stroke Patients Only)       Balance                                    Cognition Arousal/Alertness: Awake/alert                    Problem Solving: Slow processing;Requires verbal cues      Exercises      General Comments        Pertinent Vitals/Pain Pain Assessment: 0-10 Pain Score: 5  Pain Location: L knee Pain Descriptors / Indicators: Sore Pain Intervention(s): Monitored during session;Repositioned;Ice applied    Home Living                      Prior Function            PT Goals (current goals can now be found in the care plan section) Progress towards PT goals: Progressing toward goals    Frequency  7X/week    PT Plan      Co-evaluation             End of Session Equipment Utilized During Treatment: Gait belt;Left knee immobilizer Activity Tolerance: Patient tolerated treatment well Patient left: in chair;with call bell/phone within reach     Time: 4193-7902 PT Time Calculation (min) (ACUTE ONLY): 30 min  Charges:  $Gait Training: 8-22 mins $Therapeutic Activity: 8-22 mins                    G Codes:      Cecille Rubin  Anhthu Perdew  PTA [facility]  Acute  Rehab Pager      [phone]

## 2014-09-25 NOTE — Progress Notes (Signed)
   Subjective: 2 Days Post-Op Procedure(s) (LRB): LEFT UNICOMPARTMENTAL KNEE (Left) Patient reports pain as mild and moderate.   Patient seen in rounds with Dr. Wynelle Link. Still having difficulty lifting leg.  Slow progression. Patient and husband want to look into rehab.  Wants to look into [facility].  If bed available, may go today. Will arrange for transfer later today if insurance approves. Patient is well, but has had some minor complaints of pain in the knee, requiring pain medications Patient is ready to go to SNF if insurance approves.  Objective: Vital signs in last 24 hours: Temp:  [98.1 F (36.7 C)-99.2 F (37.3 C)] 99 F (37.2 C) (06/08 0628) Pulse Rate:  [75-89] 87 (06/08 0628) Resp:  [18] 18 (06/08 0628) BP: (138-203)/(64-90) 164/76 mmHg (06/08 0628) SpO2:  [95 %-98 %] 95 % (06/08 0628)  Intake/Output from previous day:  Intake/Output Summary (Last 24 hours) at 09/25/14 0736 Last data filed at 09/25/14 3976  Gross per 24 hour  Intake 1158.75 ml  Output   2525 ml  Net -1366.25 ml    Labs:  Recent Labs  09/24/14 0428 09/25/14 0507  HGB 14.0 13.2    Recent Labs  09/24/14 0428 09/25/14 0507  WBC 9.6 6.6  RBC 4.97 4.67  HCT 43.8 41.4  PLT 254 199    Recent Labs  09/24/14 0428 09/25/14 0507  NA 141 138  K 3.8 3.7  CL 107 102  CO2 23 26  BUN 20 10  CREATININE 0.94 0.75  GLUCOSE 162* 183*  CALCIUM 8.7* 8.6*   No results for input(s): LABPT, INR in the last 72 hours.  EXAM: General - Patient is Alert and Appropriate Extremity - Neurovascular intact Sensation intact distally Dorsiflexion/Plantar flexion intact Incision - clean, dry, no drainage Motor Function - intact, moving foot and toes well on exam.   Assessment/Plan: 2 Days Post-Op Procedure(s) (LRB): LEFT UNICOMPARTMENTAL KNEE (Left) Procedure(s) (LRB): LEFT UNICOMPARTMENTAL KNEE (Left) Past Medical History  Diagnosis Date  . COLONIC POLYPS, HX OF 02/07/2008  . DIABETES MELLITUS,  TYPE II 10/27/2009  . GERD 10/14/2006  . HYPERLIPIDEMIA 10/14/2006  . HYPOTHYROIDISM 10/14/2006  . LOW BACK PAIN 08/04/2007  . Obesity   . DEGENERATIVE JOINT DISEASE 10/14/2006    03/31/11: Pt states arthritis in her hip.  . Anxiety   . Depression   . HYPERTENSION 10/14/2006    off bp meds now  . Anemia    Principal Problem:   OA (osteoarthritis) of knee  Estimated body mass index is 36.26 kg/(m^2) as calculated from the following:   Height as of this encounter: 5' 3.5" (1.613 m).   Weight as of this encounter: 94.348 kg (208 lb). Up with therapy Discharge to SNF if insurance approves transfer Diet - Cardiac diet and Diabetic diet Follow up - in 2 weeks Activity - WBAT Disposition - Skilled nursing facility Condition Upon Discharge - Stable D/C Meds - See DC Summary DVT Prophylaxis - Eliquis for ten days and then Aspirin.  Arlee Muslim, PA-C Orthopaedic Surgery 09/25/2014, 7:36 AM

## 2014-09-25 NOTE — Progress Notes (Signed)
Occupational Therapy Treatment Patient Details Name: Leslie Benton MRN: [id number] DOB: 22-Apr-1940 Today's Date: 09/25/2014    History of present illness L UKR   OT comments  Pt progressing slowly.  Will benefit from SNF  Follow Up Recommendations  SNF    Equipment Recommendations  None recommended by OT    Recommendations for Other Services      Precautions / Restrictions Precautions Precautions: Knee;Fall Precaution Comments: instructed pt on use for amb  Required Braces or Orthoses: Knee Immobilizer - Left Knee Immobilizer - Left: Discontinue once straight leg raise with < 10 degree lag Restrictions Weight Bearing Restrictions: No Other Position/Activity Restrictions: WBAT       Mobility Bed Mobility            General bed mobility comments: Pt OOB in recliner  Transfers Overall transfer level: Needs assistance Equipment used: Rolling walker (2 wheeled) Transfers: Sit to/from Stand Sit to Stand: Min assist         General transfer comment: mod cues    Balance                                   ADL                       Lower Body Dressing: Maximal assistance;Sit to/from stand       Toileting- Water quality scientist and Hygiene: Moderate assistance         General ADL Comments: Pt getting ready for discharge.  Stated her pants were on backwards.  Assisted with turning pants around and donning KI on top.  Discovered pt had urinary incontinence--changed into shorts.  Pt assisted with hygiene and clothing management      Vision                     Perception     Praxis      Cognition   Behavior During Therapy: [facility] for tasks assessed/performed                  Problem Solving: Slow processing;Requires verbal cues      Extremity/Trunk Assessment               Exercises     Shoulder Instructions       General Comments      Pertinent Vitals/ Pain       Pain Assessment: 0-10 Pain  Score: 5  Pain Location: L knee Pain Descriptors / Indicators: Sore Pain Intervention(s): Monitored during session;Premedicated before session;Repositioned  Home Living                                          Prior Functioning/Environment              Frequency       Progress Toward Goals  OT Goals(current goals can now be found in the care plan section)  Progress towards OT goals: Progressing toward goals (slowly)     Plan Discharge plan needs to be updated    Co-evaluation                 End of Session     Activity Tolerance Patient limited by fatigue   Patient Left in chair;with call bell/phone within reach;with family/visitor present   Nurse  Communication          Time: 2202-5427 OT Time Calculation (min): 20 min  Charges: OT General Charges $OT Visit: 1 Procedure OT Treatments $Self Care/Home Management : 8-22 mins  Leslie Benton 09/25/2014, 2:07 PM  Leslie Benton, OTR/L [phone] 09/25/2014

## 2014-09-26 ENCOUNTER — Non-Acute Institutional Stay (SKILLED_NURSING_FACILITY): Payer: Medicare Other | Admitting: Adult Health

## 2014-09-26 ENCOUNTER — Encounter: Payer: Self-pay | Admitting: Adult Health

## 2014-09-26 DIAGNOSIS — L03011 Cellulitis of right finger: Secondary | ICD-10-CM

## 2014-09-26 DIAGNOSIS — E039 Hypothyroidism, unspecified: Secondary | ICD-10-CM | POA: Diagnosis not present

## 2014-09-26 DIAGNOSIS — K5900 Constipation, unspecified: Secondary | ICD-10-CM | POA: Diagnosis not present

## 2014-09-26 DIAGNOSIS — F32A Depression, unspecified: Secondary | ICD-10-CM

## 2014-09-26 DIAGNOSIS — F329 Major depressive disorder, single episode, unspecified: Secondary | ICD-10-CM | POA: Diagnosis not present

## 2014-09-26 DIAGNOSIS — Z471 Aftercare following joint replacement surgery: Secondary | ICD-10-CM | POA: Diagnosis not present

## 2014-09-26 DIAGNOSIS — K219 Gastro-esophageal reflux disease without esophagitis: Secondary | ICD-10-CM | POA: Diagnosis not present

## 2014-09-26 DIAGNOSIS — E119 Type 2 diabetes mellitus without complications: Secondary | ICD-10-CM | POA: Diagnosis not present

## 2014-09-26 DIAGNOSIS — R2681 Unsteadiness on feet: Secondary | ICD-10-CM | POA: Diagnosis not present

## 2014-09-26 DIAGNOSIS — M1712 Unilateral primary osteoarthritis, left knee: Secondary | ICD-10-CM | POA: Diagnosis not present

## 2014-09-26 DIAGNOSIS — R278 Other lack of coordination: Secondary | ICD-10-CM | POA: Diagnosis not present

## 2014-09-26 DIAGNOSIS — IMO0001 Reserved for inherently not codable concepts without codable children: Secondary | ICD-10-CM

## 2014-09-26 DIAGNOSIS — I10 Essential (primary) hypertension: Secondary | ICD-10-CM | POA: Diagnosis not present

## 2014-09-26 DIAGNOSIS — Z96652 Presence of left artificial knee joint: Secondary | ICD-10-CM | POA: Diagnosis not present

## 2014-09-26 DIAGNOSIS — M6281 Muscle weakness (generalized): Secondary | ICD-10-CM | POA: Diagnosis not present

## 2014-09-26 NOTE — Progress Notes (Signed)
Patient ID: Leslie Benton, female   DOB: 01/21/1941, 74 y.o.   MRN: [id number]   09/26/2014  Facility:  Nursing Home Location:  [place] Room Number: 606-P LEVEL OF CARE:  SNF (31)   Chief Complaint  Patient presents with  . Hospitalization Follow-up    Osteoarthritis S/P left knee medial unicompartmental arthroplasty, depression, hypertension, constipation, diabetes mellitus, hypothyroidism, GERD and paronychia    HISTORY OF PRESENT ILLNESS:  This is a 74 year old female who has been admitted to [facility] on 09/25/14 from [facility] with osteoarthritis S/P left knee medial unique compartmental arthroplasty. She has PMH of type 2 diabetes mellitus, hyperlipidemia, GERD and hypothyroidism.  She has been admitted for a short-term rehabilitation.  PAST MEDICAL HISTORY:  Past Medical History  Diagnosis Date  . COLONIC POLYPS, HX OF 02/07/2008  . DIABETES MELLITUS, TYPE II 10/27/2009  . GERD 10/14/2006  . HYPERLIPIDEMIA 10/14/2006  . HYPOTHYROIDISM 10/14/2006  . LOW BACK PAIN 08/04/2007  . Obesity   . DEGENERATIVE JOINT DISEASE 10/14/2006    03/31/11: Pt states arthritis in her hip.  . Anxiety   . Depression   . HYPERTENSION 10/14/2006    off bp meds now  . Anemia     CURRENT MEDICATIONS: Reviewed per MAR/see medication list  Allergies  Allergen Reactions  . Niacin And Related Hives and Swelling    No anaphalaxis, but strong reactions.  . Lactose Intolerance (Gi)     "bad taste in mouth"  . Amoxicillin Swelling    hives  . Penicillins Swelling    Hives With all cillins     REVIEW OF SYSTEMS:  GENERAL: no change in appetite, no fatigue, no weight changes, no fever, chills or weakness RESPIRATORY: no cough, SOB, DOE, wheezing, hemoptysis CARDIAC: no chest pain, edema or palpitations GI: no abdominal pain, diarrhea, constipation, heart burn, nausea or vomiting  PHYSICAL EXAMINATION  GENERAL: no acute distress, obese SKIN:   Left knee surgical incision has steri-strips EYES: conjunctivae normal, sclerae normal, normal eye lids NECK: supple, trachea midline, no neck masses, no thyroid tenderness, no thyromegaly LYMPHATICS: no LAN in the neck, no supraclavicular LAN RESPIRATORY: breathing is even & unlabored, BS CTAB CARDIAC: RRR, no murmur,no extra heart sounds, no edema GI: abdomen soft, normal BS, no masses, no tenderness, no hepatomegaly, no splenomegaly EXTREMITIES: Able to move 4 extremities; left knee immobilizer PSYCHIATRIC: the patient is alert & oriented to person, affect & behavior appropriate  LABS/RADIOLOGY: Labs reviewed: Basic Metabolic Panel:  Recent Labs  09/28/13 1910  09/17/14 1535 09/24/14 0428 09/25/14 0507  NA  --   < > 141 141 138  K  --   < > 4.1 3.8 3.7  CL  --   < > 104 107 102  CO2  --   < > 25 23 26   GLUCOSE  --   < > 153* 162* 183*  BUN  --   < > 21* 20 10  CREATININE  --   < > 1.21* 0.94 0.75  CALCIUM  --   < > 9.2 8.7* 8.6*  MG 2.1  --   --   --   --   PHOS 3.4  --   --   --   --   < > = values in this interval not displayed. Liver Function Tests:  Recent Labs  07/03/14 0500 07/04/14 0525 09/17/14 1535  AST 20 17 19   ALT 16 37* 21  ALKPHOS 85 83  86  BILITOT 0.5 0.5 0.4  PROT 6.6 6.5 7.5  ALBUMIN 3.8 3.7 4.4   CBC:  Recent Labs  07/15/14 1236 08/05/14 1246 08/26/14 1409 09/17/14 1535 09/24/14 0428 09/25/14 0507  WBC 7.4 5.3 6.0 6.3 9.6 6.6  NEUTROABS 4.3 2.6 3.3  --   --   --   HGB 10.2* 12.6 13.8 15.3* 14.0 13.2  HCT 35.1 41.0 42.2 45.7 43.8 41.4  MCV 69* 76* 80.3 85.4 88.1 88.7  PLT 364 299 344.0 270 254 199   Lipid Panel:  Recent Labs  07/03/14 0500  HDL 56   Cardiac Enzymes:  Recent Labs  07/03/14 0500 07/03/14 1040 07/03/14 1707  TROPONINI 0.14* 0.15* 0.17*   CBG:  Recent Labs  07/04/14 1157 09/23/14 0642 09/23/14 1045  GLUCAP 229* 155* 119*     ASSESSMENT/PLAN:  Osteoarthritis S/P left knee medial  unicompartmental arthroplasty - for rehabilitation; continue adequate 2.5 mg 1 tab by mouth every 12 hours 10 days then aspirin 81 mg by mouth daily for DVT prophylaxis; Robaxin 500 mg 1 tab by mouth every 6 hours when necessary for muscle spasm; oxycodone 5 mg 1-2 tabs by mouth every 3 hours when necessary for pain; follow-up with Dr. Wynelle Link on 6/20 was the 16th Depression - mood is stable; continue BuSpar 15 mg 1 tab by mouth 3 times a day Hypertension - continue propranolol 20 mg 1 tab by mouth twice a day; clonidine 0.1 mg 1 tab by mouth twice a day when necessary Constipation - continue Colace 100 mg 1 capsule by mouth twice a day and MiraLAX when necessary Diabetes mellitus, type 2 - hemoglobin A1c 7.6; continue Amaryl 4 mg 1/2 tab  = 2 mg by mouth daily and metformin 1000 mg 1 tab by mouth daily Hypothyroidism - TSH 6.93; continue Synthroid 150 g 1 tab by mouth daily GERD - continue Prilosec 20 mg 1 tab by mouth daily Paronychia of right pointer finger S/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    [facility], NP [employer] [phone]

## 2014-09-27 ENCOUNTER — Encounter: Payer: Self-pay | Admitting: Internal Medicine

## 2014-09-27 ENCOUNTER — Non-Acute Institutional Stay (SKILLED_NURSING_FACILITY): Payer: Medicare Other | Admitting: Internal Medicine

## 2014-09-27 DIAGNOSIS — L03011 Cellulitis of right finger: Secondary | ICD-10-CM

## 2014-09-27 DIAGNOSIS — M1712 Unilateral primary osteoarthritis, left knee: Secondary | ICD-10-CM | POA: Diagnosis not present

## 2014-09-27 DIAGNOSIS — I10 Essential (primary) hypertension: Secondary | ICD-10-CM

## 2014-09-27 DIAGNOSIS — K5901 Slow transit constipation: Secondary | ICD-10-CM

## 2014-09-27 DIAGNOSIS — F329 Major depressive disorder, single episode, unspecified: Secondary | ICD-10-CM | POA: Diagnosis not present

## 2014-09-27 DIAGNOSIS — F32A Depression, unspecified: Secondary | ICD-10-CM

## 2014-09-27 DIAGNOSIS — M6281 Muscle weakness (generalized): Secondary | ICD-10-CM | POA: Diagnosis not present

## 2014-09-27 DIAGNOSIS — E039 Hypothyroidism, unspecified: Secondary | ICD-10-CM | POA: Diagnosis not present

## 2014-09-27 DIAGNOSIS — R2681 Unsteadiness on feet: Secondary | ICD-10-CM | POA: Diagnosis not present

## 2014-09-27 DIAGNOSIS — Z96652 Presence of left artificial knee joint: Secondary | ICD-10-CM | POA: Diagnosis not present

## 2014-09-27 DIAGNOSIS — Z471 Aftercare following joint replacement surgery: Secondary | ICD-10-CM | POA: Diagnosis not present

## 2014-09-27 DIAGNOSIS — E119 Type 2 diabetes mellitus without complications: Secondary | ICD-10-CM | POA: Diagnosis not present

## 2014-09-27 DIAGNOSIS — R278 Other lack of coordination: Secondary | ICD-10-CM | POA: Diagnosis not present

## 2014-09-27 DIAGNOSIS — K219 Gastro-esophageal reflux disease without esophagitis: Secondary | ICD-10-CM

## 2014-09-27 NOTE — Progress Notes (Signed)
Patient ID: Leslie Benton, female   DOB: Aug 31, 1940, 74 y.o.   MRN: [id number]     [place]  PCP: [name], MD  Code Status: Full Code   Allergies  Allergen Reactions  . Niacin And Related Hives and Swelling    No anaphalaxis, but strong reactions.  . Lactose Intolerance (Gi)     "bad taste in mouth"  . Amoxicillin Swelling    hives  . Penicillins Swelling    Hives With all cillins    Chief Complaint  Patient presents with  . New Admit To SNF    New Admission      HPI:  74 year old patient is here for short term rehabilitation post hospital admission from 09/23/14-09/25/14 with left knee medial compartmental arthroplasty. She also had right finger paraonychia and underwent I7D and was placed on antibiotics. She has PMH of type 2 diabetes mellitus, hyperlipidemia, GERD and hypothyroidism. She is seen in her room today. Her pain is under control. She has muscle spasm. She has not had a bowel movement since last Sunday. No other concerns.    Review of Systems:  Constitutional: Negative for fever, chills, diaphoresis.  HENT: Negative for headache, congestion, nasal discharge Eyes: Negative for eye pain, blurred vision, double vision and discharge.  Respiratory: Negative for cough, shortness of breath and wheezing.   Cardiovascular: Negative for chest pain, palpitations, leg swelling.  Gastrointestinal: Negative for heartburn, nausea, vomiting, abdominal pain Genitourinary: Negative for dysuria Musculoskeletal: Negative for back pain, falls Skin: Negative for itching, rash.  Neurological: Negative for dizziness, tingling, focal weakness Psychiatric/Behavioral: Negative for depression   Past Medical History  Diagnosis Date  . COLONIC POLYPS, HX OF 02/07/2008  . DIABETES MELLITUS, TYPE II 10/27/2009  . GERD 10/14/2006  . HYPERLIPIDEMIA 10/14/2006  . HYPOTHYROIDISM 10/14/2006  . LOW BACK PAIN 08/04/2007  . Obesity   . DEGENERATIVE JOINT DISEASE  10/14/2006    03/31/11: Pt states arthritis in her hip.  . Anxiety   . Depression   . HYPERTENSION 10/14/2006    off bp meds now  . Anemia    Past Surgical History  Procedure Laterality Date  . Hemorrhoid surgery    . Tonsillectomy    . Left knee arthroscopic  April 2014    Dr. Durward Fortes  . Tubal ligation    . Partial knee arthroplasty Left 09/23/2014    Procedure: LEFT UNICOMPARTMENTAL KNEE;  Surgeon: [name], MD;  Location: [facility];  Service: Orthopedics;  Laterality: Left;   Social History:   reports that she has never smoked. She has never used smokeless tobacco. She reports that she does not drink alcohol or use illicit drugs.  Family History  Problem Relation Age of Onset  . Heart attack Father 23  . Kidney failure Mother     Medications: Patient's Medications  New Prescriptions   No medications on file  Previous Medications   ACETAMINOPHEN (TYLENOL) 500 MG TABLET    Take 500 mg by mouth every 6 (six) hours as needed for moderate pain or headache.   APIXABAN (ELIQUIS) 2.5 MG TABS TABLET    Take 1 tablet (2.5 mg total) by mouth every 12 (twelve) hours. Take for ten days and then discontinue the Apixaban an then take an 81 mg Baby Aspirin daily for three more weeks.   ASPIRIN EC 81 MG TABLET    Take 81 mg by mouth daily.   BUSPIRONE (BUSPAR) 15 MG TABLET    Take 1  tablet (15 mg total) by mouth 3 (three) times daily.   CLINDAMYCIN (CLEOCIN) 300 MG CAPSULE    Take 1 capsule (300 mg total) by mouth 3 (three) times daily.   CLONIDINE (CATAPRES) 0.1 MG TABLET    Take 1 tablet (0.1 mg total) by mouth 2 (two) times daily as needed (prn sbp>160).   DIPHENHYDRAMINE-ZINC ACETATE (BENADRYL) CREAM    Apply topically 4 (four) times daily as needed for itching (Apply to left upper arm for insect bite).   DOCUSATE SODIUM (COLACE) 100 MG CAPSULE    Take 1 capsule (100 mg total) by mouth 2 (two) times daily.   GLIMEPIRIDE (AMARYL) 4 MG TABLET    TAKE 1/2 A TABLET BY MOUTH DAILY WITH  BREAKFAST   GLUCOSE BLOOD (ACCU-CHEK AVIVA) TEST STRIP    USE TO CHECK BLOOD SUGAR DAILY AND PRN   LANCETS (ACCU-CHEK MULTICLIX) LANCETS    USE TO CHECK BLOOD SUGAR DAILY AND PRN   METFORMIN (GLUCOPHAGE) 1000 MG TABLET    Take 1 tablet (1,000 mg total) by mouth daily with breakfast.   METHOCARBAMOL (ROBAXIN) 500 MG TABLET    Take 1 tablet (500 mg total) by mouth every 6 (six) hours as needed for muscle spasms.   METOCLOPRAMIDE (REGLAN) 5 MG TABLET    Take 1 tablet (5 mg total) by mouth every 8 (eight) hours as needed for nausea (if ondansetron (ZOFRAN) ineffective.).   OMEPRAZOLE (PRILOSEC) 20 MG CAPSULE    take 1 capsule by mouth once daily   ONDANSETRON (ZOFRAN) 4 MG TABLET    Take 1 tablet (4 mg total) by mouth every 6 (six) hours as needed for nausea.   OXYCODONE (OXY IR/ROXICODONE) 5 MG IMMEDIATE RELEASE TABLET    Take 1-2 tablets (5-10 mg total) by mouth every 3 (three) hours as needed for moderate pain, severe pain or breakthrough pain.   POLYETHYLENE GLYCOL (MIRALAX / GLYCOLAX) PACKET    Take 17 g by mouth daily as needed for mild constipation.   PROPRANOLOL (INDERAL) 20 MG TABLET    Take 1 tablet (20 mg total) by mouth 2 (two) times daily.   SODIUM CHLORIDE (MURO 128) 2 % OPHTHALMIC SOLUTION    Place 1-2 drops into both eyes daily as needed (dry eye).  Modified Medications   No medications on file  Discontinued Medications   LEVOTHYROXINE (SYNTHROID, LEVOTHROID) 150 MCG TABLET    Take 1 tablet (150 mcg total) by mouth daily.   MUPIROCIN OINTMENT (BACTROBAN) 2 %       UNABLE TO FIND    Benadryl es cream applying to left upper arm insect bite 4 times per day     Physical Exam: Filed Vitals:   09/27/14 0913  BP: 150/70  Pulse: 67  Temp: 96.7 F (35.9 C)  TempSrc: Oral  Resp: 20  Height: 5\' 3"  (1.6 m)  Weight: 212 lb (96.163 kg)  SpO2: 97%    General- elderly female,  in no acute distress Head- normocephalic, atraumatic Throat- moist mucus membrane Eyes- PERRLA, EOMI, no  pallor, no icterus, no discharge, normal conjunctiva, normal sclera Neck- no cervical lymphadenopathy Cardiovascular- normal s1,s2, no murmurs, palpable dorsalis pedis and radial pulses, left leg edema Respiratory- bilateral clear to auscultation, no wheeze, no rhonchi, no crackles, no use of accessory muscles Abdomen- bowel sounds present, soft, non tender Musculoskeletal- able to move all 4 extremities, left knee limited range of motion, arthritis changes in digits Neurological- no focal deficit Skin- warm and dry Psychiatry- alert and oriented  Labs reviewed: Basic Metabolic Panel:  Recent Labs  09/28/13 1910  09/17/14 1535 09/24/14 0428 09/25/14 0507  NA  --   < > 141 141 138  K  --   < > 4.1 3.8 3.7  CL  --   < > 104 107 102  CO2  --   < > 25 23 26   GLUCOSE  --   < > 153* 162* 183*  BUN  --   < > 21* 20 10  CREATININE  --   < > 1.21* 0.94 0.75  CALCIUM  --   < > 9.2 8.7* 8.6*  MG 2.1  --   --   --   --   PHOS 3.4  --   --   --   --   < > = values in this interval not displayed. Liver Function Tests:  Recent Labs  07/03/14 0500 07/04/14 0525 09/17/14 1535  AST 20 17 19   ALT 16 37* 21  ALKPHOS 85 83 86  BILITOT 0.5 0.5 0.4  PROT 6.6 6.5 7.5  ALBUMIN 3.8 3.7 4.4   No results for input(s): LIPASE, AMYLASE in the last 8760 hours. No results for input(s): AMMONIA in the last 8760 hours. CBC:  Recent Labs  07/15/14 1236 08/05/14 1246 08/26/14 1409 09/17/14 1535 09/24/14 0428 09/25/14 0507  WBC 7.4 5.3 6.0 6.3 9.6 6.6  NEUTROABS 4.3 2.6 3.3  --   --   --   HGB 10.2* 12.6 13.8 15.3* 14.0 13.2  HCT 35.1 41.0 42.2 45.7 43.8 41.4  MCV 69* 76* 80.3 85.4 88.1 88.7  PLT 364 299 344.0 270 254 199   Cardiac Enzymes:  Recent Labs  07/03/14 0500 07/03/14 1040 07/03/14 1707  TROPONINI 0.14* 0.15* 0.17*   BNP: Invalid input(s): POCBNP CBG:  Recent Labs  07/04/14 1157 09/23/14 0642 09/23/14 1045  GLUCAP 229* 155* 119*      Assessment/Plan  Left  knee Osteoarthritis  S/P left knee medial unicompartmental arthroplasty. Will have her work with physical therapy and occupational therapy team to help with gait training and muscle strengthening exercises.fall precautions. Skin care. Encourage to be out of bed. Continue oxyIR 5 mg 1-2 tab q3h prn pain. Continue eliquis 2.5 mg bid for now for dvt prophylaxis x 10 days, then aspirin 81 mg daily. Continue robaxin 500 mg q6h prn for muscle spasm. Pt advised to ask for it. Has f/u with orthopedics  Paronychia S/P I/D, looks resolved on exam. Complete course of  clindamycin 300 mg tid  Hypertension Elevated bp. continue propranolol 20 mg bid and clonidine 0.1 mg bid prn for bp > 140/90  Constipation  D/c colace. Start senna s bid and miralax bid for now followed by miralax daily in 2 days  Depression  mood is stable, continue buspar 15 mg tid  Diabetes mellitus, type 2 hemoglobin A1c 7.6. continue Amaryl 2 mg daily and metformin 1000 mg daily  Hypothyroidism  continue Synthroid 150 mcg daily  GERD continue Prilosec 20 mg daily     Goals of care: short term rehabilitation   Labs/tests ordered: none  Family/ staff Communication: reviewed care plan with patient and nursing supervisor    Blanchie Serve, MD  [facility] [phone] (Monday-Friday 8 am - 5 pm) [phone] (afterhours)

## 2014-09-28 ENCOUNTER — Non-Acute Institutional Stay (SKILLED_NURSING_FACILITY): Payer: Medicare Other | Admitting: Adult Health

## 2014-09-28 DIAGNOSIS — K5901 Slow transit constipation: Secondary | ICD-10-CM

## 2014-09-28 DIAGNOSIS — Z96652 Presence of left artificial knee joint: Secondary | ICD-10-CM | POA: Diagnosis not present

## 2014-09-28 DIAGNOSIS — K5900 Constipation, unspecified: Secondary | ICD-10-CM | POA: Diagnosis not present

## 2014-09-28 DIAGNOSIS — I10 Essential (primary) hypertension: Secondary | ICD-10-CM | POA: Diagnosis not present

## 2014-09-28 DIAGNOSIS — L03011 Cellulitis of right finger: Secondary | ICD-10-CM | POA: Diagnosis not present

## 2014-09-28 DIAGNOSIS — M1712 Unilateral primary osteoarthritis, left knee: Secondary | ICD-10-CM

## 2014-09-28 DIAGNOSIS — K219 Gastro-esophageal reflux disease without esophagitis: Secondary | ICD-10-CM | POA: Diagnosis not present

## 2014-09-28 DIAGNOSIS — E119 Type 2 diabetes mellitus without complications: Secondary | ICD-10-CM

## 2014-09-28 DIAGNOSIS — E039 Hypothyroidism, unspecified: Secondary | ICD-10-CM

## 2014-09-28 DIAGNOSIS — M6281 Muscle weakness (generalized): Secondary | ICD-10-CM | POA: Diagnosis not present

## 2014-09-28 DIAGNOSIS — R278 Other lack of coordination: Secondary | ICD-10-CM | POA: Diagnosis not present

## 2014-09-28 DIAGNOSIS — F329 Major depressive disorder, single episode, unspecified: Secondary | ICD-10-CM | POA: Diagnosis not present

## 2014-09-28 DIAGNOSIS — F32A Depression, unspecified: Secondary | ICD-10-CM

## 2014-09-28 DIAGNOSIS — IMO0001 Reserved for inherently not codable concepts without codable children: Secondary | ICD-10-CM

## 2014-09-28 DIAGNOSIS — Z471 Aftercare following joint replacement surgery: Secondary | ICD-10-CM | POA: Diagnosis not present

## 2014-09-28 DIAGNOSIS — R2681 Unsteadiness on feet: Secondary | ICD-10-CM | POA: Diagnosis not present

## 2014-10-01 DIAGNOSIS — E669 Obesity, unspecified: Secondary | ICD-10-CM | POA: Diagnosis not present

## 2014-10-01 DIAGNOSIS — F419 Anxiety disorder, unspecified: Secondary | ICD-10-CM | POA: Diagnosis not present

## 2014-10-01 DIAGNOSIS — I10 Essential (primary) hypertension: Secondary | ICD-10-CM | POA: Diagnosis not present

## 2014-10-01 DIAGNOSIS — Z471 Aftercare following joint replacement surgery: Secondary | ICD-10-CM | POA: Diagnosis not present

## 2014-10-01 DIAGNOSIS — M1990 Unspecified osteoarthritis, unspecified site: Secondary | ICD-10-CM | POA: Diagnosis not present

## 2014-10-01 DIAGNOSIS — E119 Type 2 diabetes mellitus without complications: Secondary | ICD-10-CM | POA: Diagnosis not present

## 2014-10-02 DIAGNOSIS — F419 Anxiety disorder, unspecified: Secondary | ICD-10-CM | POA: Diagnosis not present

## 2014-10-02 DIAGNOSIS — I10 Essential (primary) hypertension: Secondary | ICD-10-CM | POA: Diagnosis not present

## 2014-10-02 DIAGNOSIS — Z471 Aftercare following joint replacement surgery: Secondary | ICD-10-CM | POA: Diagnosis not present

## 2014-10-02 DIAGNOSIS — E669 Obesity, unspecified: Secondary | ICD-10-CM | POA: Diagnosis not present

## 2014-10-02 DIAGNOSIS — M1990 Unspecified osteoarthritis, unspecified site: Secondary | ICD-10-CM | POA: Diagnosis not present

## 2014-10-02 DIAGNOSIS — E119 Type 2 diabetes mellitus without complications: Secondary | ICD-10-CM | POA: Diagnosis not present

## 2014-10-03 ENCOUNTER — Other Ambulatory Visit: Payer: Self-pay | Admitting: Adult Health

## 2014-10-03 NOTE — Telephone Encounter (Signed)
Called and spoke with pt and pt states her finger healed from the first time but she needs to go have her fingernails and toes done and she wants to be prepared in case she has an infection again.  Pls advise.

## 2014-10-03 NOTE — Telephone Encounter (Signed)
If you have another infection you need to come and see me so we can drain it if needed.

## 2014-10-04 ENCOUNTER — Telehealth: Payer: Self-pay | Admitting: Internal Medicine

## 2014-10-04 DIAGNOSIS — M1990 Unspecified osteoarthritis, unspecified site: Secondary | ICD-10-CM | POA: Diagnosis not present

## 2014-10-04 DIAGNOSIS — E119 Type 2 diabetes mellitus without complications: Secondary | ICD-10-CM | POA: Diagnosis not present

## 2014-10-04 DIAGNOSIS — Z471 Aftercare following joint replacement surgery: Secondary | ICD-10-CM | POA: Diagnosis not present

## 2014-10-04 DIAGNOSIS — F419 Anxiety disorder, unspecified: Secondary | ICD-10-CM | POA: Diagnosis not present

## 2014-10-04 DIAGNOSIS — E669 Obesity, unspecified: Secondary | ICD-10-CM | POA: Diagnosis not present

## 2014-10-04 DIAGNOSIS — I10 Essential (primary) hypertension: Secondary | ICD-10-CM | POA: Diagnosis not present

## 2014-10-04 NOTE — Telephone Encounter (Signed)
Per Leslie Benton antibiotic was refused; advised that pt call the office to be evaluated if pt feels like her finger is infected again.Also Leslie Benton wants to prevent pt from becoming resistant to antibiotics. Pt states she has a bloody thumb but she does not feel like her finger is infected.Pt states her fingernails are so thin and they split so fast. Advised pt to continue to monitor her fingers and put Neosporin on them if needed.  Pt states she cannot get into the office until next week but that is difficult as well since having surgery.  Advised pt to call office if further assistance is needed.

## 2014-10-04 NOTE — Telephone Encounter (Addendum)
Pt would like cory to call in abx for precaution measurement  For her finger. Pt decline an appointment

## 2014-10-07 DIAGNOSIS — E669 Obesity, unspecified: Secondary | ICD-10-CM | POA: Diagnosis not present

## 2014-10-07 DIAGNOSIS — Z471 Aftercare following joint replacement surgery: Secondary | ICD-10-CM | POA: Diagnosis not present

## 2014-10-07 DIAGNOSIS — F419 Anxiety disorder, unspecified: Secondary | ICD-10-CM | POA: Diagnosis not present

## 2014-10-07 DIAGNOSIS — I10 Essential (primary) hypertension: Secondary | ICD-10-CM | POA: Diagnosis not present

## 2014-10-07 DIAGNOSIS — E119 Type 2 diabetes mellitus without complications: Secondary | ICD-10-CM | POA: Diagnosis not present

## 2014-10-07 DIAGNOSIS — M1990 Unspecified osteoarthritis, unspecified site: Secondary | ICD-10-CM | POA: Diagnosis not present

## 2014-10-08 DIAGNOSIS — Z96652 Presence of left artificial knee joint: Secondary | ICD-10-CM | POA: Diagnosis not present

## 2014-10-08 DIAGNOSIS — Z471 Aftercare following joint replacement surgery: Secondary | ICD-10-CM | POA: Diagnosis not present

## 2014-10-09 DIAGNOSIS — M1990 Unspecified osteoarthritis, unspecified site: Secondary | ICD-10-CM | POA: Diagnosis not present

## 2014-10-09 DIAGNOSIS — Z471 Aftercare following joint replacement surgery: Secondary | ICD-10-CM | POA: Diagnosis not present

## 2014-10-09 DIAGNOSIS — I10 Essential (primary) hypertension: Secondary | ICD-10-CM | POA: Diagnosis not present

## 2014-10-09 DIAGNOSIS — E669 Obesity, unspecified: Secondary | ICD-10-CM | POA: Diagnosis not present

## 2014-10-09 DIAGNOSIS — F419 Anxiety disorder, unspecified: Secondary | ICD-10-CM | POA: Diagnosis not present

## 2014-10-09 DIAGNOSIS — E119 Type 2 diabetes mellitus without complications: Secondary | ICD-10-CM | POA: Diagnosis not present

## 2014-10-11 DIAGNOSIS — E669 Obesity, unspecified: Secondary | ICD-10-CM | POA: Diagnosis not present

## 2014-10-11 DIAGNOSIS — I10 Essential (primary) hypertension: Secondary | ICD-10-CM | POA: Diagnosis not present

## 2014-10-11 DIAGNOSIS — E119 Type 2 diabetes mellitus without complications: Secondary | ICD-10-CM | POA: Diagnosis not present

## 2014-10-11 DIAGNOSIS — M1990 Unspecified osteoarthritis, unspecified site: Secondary | ICD-10-CM | POA: Diagnosis not present

## 2014-10-11 DIAGNOSIS — Z471 Aftercare following joint replacement surgery: Secondary | ICD-10-CM | POA: Diagnosis not present

## 2014-10-11 DIAGNOSIS — F419 Anxiety disorder, unspecified: Secondary | ICD-10-CM | POA: Diagnosis not present

## 2014-10-13 ENCOUNTER — Encounter: Payer: Self-pay | Admitting: Adult Health

## 2014-10-13 NOTE — Progress Notes (Signed)
Patient ID: Leslie Benton, female   DOB: February 07, 1941, 74 y.o.   MRN: [id number]   09/28/14  Facility:  [facility] Location:  [place] Room Number: 606-P LEVEL OF CARE:  SNF (31)   Chief Complaint  Patient presents with  . Discharge Note    Osteoarthritis S/P left knee medial unicompartmental arthroplasty, depression, hypertension, constipation, diabetes mellitus, hypothyroidism, GERD and paronychia    HISTORY OF PRESENT ILLNESS:  This is a 74 year old female who is for discharge home with Home health PT and OT. She has been admitted to [facility] on 09/25/14 from [facility] with osteoarthritis S/P left knee medial compartmental arthroplasty.  She, also, had right finger paronychia I/D and had completed Clindamycin course. She has PMH of type 2 diabetes mellitus, hyperlipidemia, GERD and hypothyroidism.  Patient was admitted to this facility for short-term rehabilitation after the patient's recent hospitalization.  Patient has completed SNF rehabilitation and therapy has cleared the patient for discharge.   PAST MEDICAL HISTORY:  Past Medical History  Diagnosis Date  . COLONIC POLYPS, HX OF 02/07/2008  . DIABETES MELLITUS, TYPE II 10/27/2009  . GERD 10/14/2006  . HYPERLIPIDEMIA 10/14/2006  . HYPOTHYROIDISM 10/14/2006  . LOW BACK PAIN 08/04/2007  . Obesity   . DEGENERATIVE JOINT DISEASE 10/14/2006    03/31/11: Pt states arthritis in her hip.  . Anxiety   . Depression   . HYPERTENSION 10/14/2006    off bp meds now  . Anemia     CURRENT MEDICATIONS: Reviewed per MAR/see medication list  Allergies  Allergen Reactions  . Niacin And Related Hives and Swelling    No anaphalaxis, but strong reactions.  . Lactose Intolerance (Gi)     "bad taste in mouth"  . Amoxicillin Swelling    hives  . Penicillins Swelling    Hives With all cillins     REVIEW OF SYSTEMS:  GENERAL: no change in appetite, no fatigue, no weight changes, no fever,  chills or weakness RESPIRATORY: no cough, SOB, DOE, wheezing, hemoptysis CARDIAC: no chest pain, edema or palpitations GI: no abdominal pain, diarrhea, constipation, heart burn, nausea or vomiting  PHYSICAL EXAMINATION  GENERAL: no acute distress, obese SKIN:  Left knee surgical incision has steri-strips, dry, no erythema NECK: supple, trachea midline, no neck masses, no thyroid tenderness, no thyromegaly LYMPHATICS: no LAN in the neck, no supraclavicular LAN RESPIRATORY: breathing is even & unlabored, BS CTAB CARDIAC: RRR, no murmur,no extra heart sounds, no edema GI: abdomen soft, normal BS, no masses, no tenderness, no hepatomegaly, no splenomegaly EXTREMITIES: Able to move 4 extremities; left knee immobilizer PSYCHIATRIC: the patient is alert & oriented to person, affect & behavior appropriate  LABS/RADIOLOGY: Labs reviewed: Basic Metabolic Panel:  Recent Labs  09/17/14 1535 09/24/14 0428 09/25/14 0507  NA 141 141 138  K 4.1 3.8 3.7  CL 104 107 102  CO2 25 23 26   GLUCOSE 153* 162* 183*  BUN 21* 20 10  CREATININE 1.21* 0.94 0.75  CALCIUM 9.2 8.7* 8.6*   Liver Function Tests:  Recent Labs  07/03/14 0500 07/04/14 0525 09/17/14 1535  AST 20 17 19   ALT 16 37* 21  ALKPHOS 85 83 86  BILITOT 0.5 0.5 0.4  PROT 6.6 6.5 7.5  ALBUMIN 3.8 3.7 4.4   CBC:  Recent Labs  07/15/14 1236 08/05/14 1246 08/26/14 1409 09/17/14 1535 09/24/14 0428 09/25/14 0507  WBC 7.4 5.3 6.0 6.3 9.6 6.6  NEUTROABS 4.3 2.6 3.3  --   --   --  HGB 10.2* 12.6 13.8 15.3* 14.0 13.2  HCT 35.1 41.0 42.2 45.7 43.8 41.4  MCV 69* 76* 80.3 85.4 88.1 88.7  PLT 364 299 344.0 270 254 199   Lipid Panel:  Recent Labs  07/03/14 0500  HDL 56   Cardiac Enzymes:  Recent Labs  07/03/14 0500 07/03/14 1040 07/03/14 1707  TROPONINI 0.14* 0.15* 0.17*   CBG:  Recent Labs  07/04/14 1157 09/23/14 0642 09/23/14 1045  GLUCAP 229* 155* 119*     ASSESSMENT/PLAN:  Osteoarthritis S/P left  knee medial unicompartmental arthroplasty - for Home health PT and OT; continue aspirin 81 mg by mouth daily for DVT prophylaxis; Robaxin 500 mg 1 tab by mouth every 6 hours when necessary for muscle spasm; oxycodone 5 mg 1-2 tabs by mouth every 3 hours when necessary for pain; follow-up with Dr. Wynelle Link, orthopedic surgeon Depression - mood is stable; continue BuSpar 15 mg 1 tab by mouth 3 times a day Hypertension - continue propranolol 20 mg 1 tab by mouth twice a day; clonidine 0.1 mg 1 tab by mouth twice a day when necessary Constipation - continue Colace 100 mg 1 capsule by mouth twice a day and MiraLAX when necessary Diabetes mellitus, type 2 - hemoglobin A1c 7.6; continue Amaryl 4 mg 1/2 tab  = 2 mg by mouth daily and metformin 1000 mg 1 tab by mouth daily Hypothyroidism - TSH 6.93; continue Synthroid 150 g 1 tab by mouth daily GERD - continue Prilosec 20 mg 1 tab by mouth daily Paronychia of right pointer finger S/P I/D - resolved     I have filled out patient's discharge paperwork and written prescriptions.  Patient will receive home health PT and OT.  Total discharge time: Less than 30 minutes  Discharge time involved coordination of the discharge process with Education officer, museum, nursing staff and therapy department. Medical justification for home health services verified.     [facility], NP [employer] [phone]

## 2014-10-14 ENCOUNTER — Other Ambulatory Visit: Payer: Self-pay

## 2014-10-18 ENCOUNTER — Other Ambulatory Visit: Payer: Self-pay | Admitting: Internal Medicine

## 2014-10-24 DIAGNOSIS — M1712 Unilateral primary osteoarthritis, left knee: Secondary | ICD-10-CM | POA: Diagnosis not present

## 2014-10-29 DIAGNOSIS — M1712 Unilateral primary osteoarthritis, left knee: Secondary | ICD-10-CM | POA: Diagnosis not present

## 2014-10-31 DIAGNOSIS — Z96652 Presence of left artificial knee joint: Secondary | ICD-10-CM | POA: Diagnosis not present

## 2014-10-31 DIAGNOSIS — Z471 Aftercare following joint replacement surgery: Secondary | ICD-10-CM | POA: Diagnosis not present

## 2014-11-05 DIAGNOSIS — M1712 Unilateral primary osteoarthritis, left knee: Secondary | ICD-10-CM | POA: Diagnosis not present

## 2014-11-07 DIAGNOSIS — M1712 Unilateral primary osteoarthritis, left knee: Secondary | ICD-10-CM | POA: Diagnosis not present

## 2014-11-08 DIAGNOSIS — M5136 Other intervertebral disc degeneration, lumbar region: Secondary | ICD-10-CM | POA: Diagnosis not present

## 2014-11-12 DIAGNOSIS — M1712 Unilateral primary osteoarthritis, left knee: Secondary | ICD-10-CM | POA: Diagnosis not present

## 2014-11-18 DIAGNOSIS — M1712 Unilateral primary osteoarthritis, left knee: Secondary | ICD-10-CM | POA: Diagnosis not present

## 2014-11-21 DIAGNOSIS — M1712 Unilateral primary osteoarthritis, left knee: Secondary | ICD-10-CM | POA: Diagnosis not present

## 2014-11-25 DIAGNOSIS — M1712 Unilateral primary osteoarthritis, left knee: Secondary | ICD-10-CM | POA: Diagnosis not present

## 2014-11-26 ENCOUNTER — Encounter: Payer: Self-pay | Admitting: Internal Medicine

## 2014-11-26 ENCOUNTER — Ambulatory Visit (INDEPENDENT_AMBULATORY_CARE_PROVIDER_SITE_OTHER): Payer: Medicare Other | Admitting: Internal Medicine

## 2014-11-26 VITALS — BP 168/80 | HR 61 | Temp 98.0°F | Ht 63.0 in | Wt 200.0 lb

## 2014-11-26 DIAGNOSIS — M150 Primary generalized (osteo)arthritis: Secondary | ICD-10-CM

## 2014-11-26 DIAGNOSIS — I10 Essential (primary) hypertension: Secondary | ICD-10-CM

## 2014-11-26 DIAGNOSIS — M545 Low back pain: Secondary | ICD-10-CM

## 2014-11-26 DIAGNOSIS — E039 Hypothyroidism, unspecified: Secondary | ICD-10-CM

## 2014-11-26 DIAGNOSIS — E119 Type 2 diabetes mellitus without complications: Secondary | ICD-10-CM | POA: Diagnosis not present

## 2014-11-26 DIAGNOSIS — M159 Polyosteoarthritis, unspecified: Secondary | ICD-10-CM

## 2014-11-26 MED ORDER — LISINOPRIL 20 MG PO TABS: 20.0000 mg | ORAL_TABLET | Freq: Every day | ORAL | Status: DC

## 2014-11-26 NOTE — Patient Instructions (Signed)
Limit your sodium (Salt) intake  Please check your blood pressure on a regular basis.  If it is consistently greater than 150/90, please make an office appointment.  Return in one month for follow-up  

## 2014-11-26 NOTE — Progress Notes (Signed)
Pre visit review using our clinic review tool, if applicable. No additional management support is needed unless otherwise documented below in the visit note. 

## 2014-11-26 NOTE — Progress Notes (Signed)
Subjective:    Patient ID: Leslie Benton, female    DOB: 1940/07/18, 74 y.o.   MRN: [id number]  HPI  Lab Results  Component Value Date   HGBA1C 7.6* 07/03/2014   74 year old patient who is seen today for follow-up.  She is quite pleased with her progress following left total knee replacement surgery.  She has essential hypertension, type 2 diabetes and a history of osteoarthritis.  Earlier this year.  She was treated for iron deficiency anemia.  This has resolved.  She generally feels well.  Complaints include low back pain that has been chronic and intermittent.  She also complains of numbness involving both hands that has been chronic.  Denies any numbness or paresthesias involving her feet. At the present time.  The patient is taking no blood pressure medications Medicine for diabetes include glimepiride 2 mg daily and metformin 1000 mg daily  Past Medical History  Diagnosis Date  . COLONIC POLYPS, HX OF 02/07/2008  . DIABETES MELLITUS, TYPE II 10/27/2009  . GERD 10/14/2006  . HYPERLIPIDEMIA 10/14/2006  . HYPOTHYROIDISM 10/14/2006  . LOW BACK PAIN 08/04/2007  . Obesity   . DEGENERATIVE JOINT DISEASE 10/14/2006    03/31/11: Pt states arthritis in her hip.  . Anxiety   . Depression   . HYPERTENSION 10/14/2006    off bp meds now  . Anemia     History   Social History  . Marital Status: Married    Spouse Name: N/A  . Number of Children: 2  . Years of Education: N/A   Occupational History  . Not on file.   Social History Main Topics  . Smoking status: Never Smoker   . Smokeless tobacco: Never Used     Comment: Never Used Tobacco  . Alcohol Use: No  . Drug Use: No  . Sexual Activity: Not on file   Other Topics Concern  . Not on file   Social History Narrative   Lives with husband and son.      Past Surgical History  Procedure Laterality Date  . Hemorrhoid surgery    . Tonsillectomy    . Left knee arthroscopic  April 2014    Dr. Durward Fortes  . Tubal ligation      . Partial knee arthroplasty Left 09/23/2014    Procedure: LEFT UNICOMPARTMENTAL KNEE;  Surgeon: [name], MD;  Location: [facility];  Service: Orthopedics;  Laterality: Left;    Family History  Problem Relation Age of Onset  . Heart attack Father 70  . Kidney failure Mother     Allergies  Allergen Reactions  . Niacin And Related Hives and Swelling    No anaphalaxis, but strong reactions.  . Lactose Intolerance (Gi)     "bad taste in mouth"  . Amoxicillin Swelling    hives  . Penicillins Swelling    Hives With all cillins    Current Outpatient Prescriptions on File Prior to Visit  Medication Sig Dispense Refill  . acetaminophen (TYLENOL) 500 MG tablet Take 500 mg by mouth every 6 (six) hours as needed for moderate pain or headache.    . busPIRone (BUSPAR) 15 MG tablet Take 1 tablet (15 mg total) by mouth 3 (three) times daily. 180 tablet 3  . glimepiride (AMARYL) 4 MG tablet TAKE 1/2 A TABLET BY MOUTH DAILY WITH BREAKFAST 45 tablet 1  . glucose blood (ACCU-CHEK AVIVA) test strip USE TO CHECK BLOOD SUGAR DAILY AND PRN 100 each 12  . Lancets (ACCU-CHEK MULTICLIX) lancets  USE TO CHECK BLOOD SUGAR DAILY AND PRN 100 each 4  . metFORMIN (GLUCOPHAGE) 1000 MG tablet TAKE 1 TABLET BY MOUTH DAILY WITH BREAKFAST 90 tablet 1  . sodium chloride (MURO 128) 2 % ophthalmic solution Place 1-2 drops into both eyes daily as needed (dry eye).    Marland Kitchen apixaban (ELIQUIS) 2.5 MG TABS tablet Take 1 tablet (2.5 mg total) by mouth every 12 (twelve) hours. Take for ten days and then discontinue the Apixaban an then take an 81 mg Baby Aspirin daily for three more weeks. (Patient not taking: Reported on 11/26/2014) 20 tablet 0  . aspirin EC 81 MG tablet Take 81 mg by mouth daily.    . clindamycin (CLEOCIN) 300 MG capsule Take 1 capsule (300 mg total) by mouth 3 (three) times daily. (Patient not taking: Reported on 11/26/2014) 30 capsule 0  . cloNIDine (CATAPRES) 0.1 MG tablet Take 1 tablet (0.1 mg total) by mouth 2  (two) times daily as needed (prn sbp>160). (Patient not taking: Reported on 11/26/2014) 60 tablet 11  . diphenhydrAMINE-zinc acetate (BENADRYL) cream Apply topically 4 (four) times daily as needed for itching (Apply to left upper arm for insect bite).    Marland Kitchen docusate sodium (COLACE) 100 MG capsule Take 1 capsule (100 mg total) by mouth 2 (two) times daily. (Patient not taking: Reported on 11/26/2014) 10 capsule 0  . methocarbamol (ROBAXIN) 500 MG tablet Take 1 tablet (500 mg total) by mouth every 6 (six) hours as needed for muscle spasms. (Patient not taking: Reported on 11/26/2014) 80 tablet 0  . metoCLOPramide (REGLAN) 5 MG tablet Take 1 tablet (5 mg total) by mouth every 8 (eight) hours as needed for nausea (if ondansetron (ZOFRAN) ineffective.). (Patient not taking: Reported on 11/26/2014) 40 tablet 0  . omeprazole (PRILOSEC) 20 MG capsule take 1 capsule by mouth once daily (Patient not taking: Reported on 11/26/2014) 90 capsule 1  . ondansetron (ZOFRAN) 4 MG tablet Take 1 tablet (4 mg total) by mouth every 6 (six) hours as needed for nausea. (Patient not taking: Reported on 11/26/2014) 20 tablet 0  . oxyCODONE (OXY IR/ROXICODONE) 5 MG immediate release tablet Take 1-2 tablets (5-10 mg total) by mouth every 3 (three) hours as needed for moderate pain, severe pain or breakthrough pain. (Patient not taking: Reported on 11/26/2014) 80 tablet 0  . polyethylene glycol (MIRALAX / GLYCOLAX) packet Take 17 g by mouth daily as needed for mild constipation. (Patient not taking: Reported on 11/26/2014) 14 each 0  . propranolol (INDERAL) 20 MG tablet Take 1 tablet (20 mg total) by mouth 2 (two) times daily. (Patient not taking: Reported on 11/26/2014) 180 tablet 3   No current facility-administered medications on file prior to visit.    BP 168/80 mmHg  Pulse 61  Temp(Src) 98 F (36.7 C)  Ht 5\' 3"  (1.6 m)  Wt 200 lb (90.719 kg)  BMI 35.44 kg/m2      Review of Systems  Constitutional: Negative.   HENT: Negative for  congestion, dental problem, hearing loss, rhinorrhea, sinus pressure, sore throat and tinnitus.   Eyes: Negative for pain, discharge and visual disturbance.  Respiratory: Negative for cough and shortness of breath.   Cardiovascular: Negative for chest pain, palpitations and leg swelling.  Gastrointestinal: Negative for nausea, vomiting, abdominal pain, diarrhea, constipation, blood in stool and abdominal distention.  Genitourinary: Negative for dysuria, urgency, frequency, hematuria, flank pain, vaginal bleeding, vaginal discharge, difficulty urinating, vaginal pain and pelvic pain.  Musculoskeletal: Negative for joint swelling, arthralgias  and gait problem.  Skin: Negative for rash.  Neurological: Positive for numbness. Negative for dizziness, syncope, speech difficulty, weakness and headaches.  Hematological: Negative for adenopathy.  Psychiatric/Behavioral: Negative for behavioral problems, dysphoric mood and agitation. The patient is not nervous/anxious.        Objective:   Physical Exam  Constitutional: She is oriented to person, place, and time. She appears well-developed and well-nourished.  Weight 200 Blood pressure 160/80  HENT:  Head: Normocephalic.  Right Ear: External ear normal.  Left Ear: External ear normal.  Mouth/Throat: Oropharynx is clear and moist.  Eyes: Conjunctivae and EOM are normal. Pupils are equal, round, and reactive to light.  Neck: Normal range of motion. Neck supple. No thyromegaly present.  Cardiovascular: Normal rate, regular rhythm, normal heart sounds and intact distal pulses.   Pulmonary/Chest: Effort normal and breath sounds normal.  Abdominal: Soft. Bowel sounds are normal. She exhibits no mass. There is no tenderness.  Musculoskeletal: Normal range of motion.  Lymphadenopathy:    She has no cervical adenopathy.  Neurological: She is alert and oriented to person, place, and time.  Skin: Skin is warm and dry. No rash noted.  Psychiatric: She has  a normal mood and affect. Her behavior is normal.          Assessment & Plan:   Diabetes mellitus.  Will check a hemoglobin A1c Hypertension.  Compliance with medications.  Stressed.  Patient presently is on no blood pressure medications at this time.  We'll start lisinopril 20 mg daily.  Home blood pressure monitoring.  Encouraged.  Low-salt diet recommended.  Will recheck in 4 weeks Osteoarthritis Chronic low back pain History of anemia.  Stable

## 2014-11-27 LAB — HEMOGLOBIN A1C: Hgb A1c MFr Bld: 7.7 % — ABNORMAL HIGH (ref 4.6–6.5)

## 2014-11-28 MED ORDER — PIOGLITAZONE HCL 30 MG PO TABS: 30.0000 mg | ORAL_TABLET | Freq: Every day | ORAL | Status: DC

## 2014-11-28 NOTE — Addendum Note (Signed)
Addended by: Westley Hummer B on: 11/28/2014 05:21 PM   Modules accepted: Orders

## 2014-11-29 DIAGNOSIS — M1712 Unilateral primary osteoarthritis, left knee: Secondary | ICD-10-CM | POA: Diagnosis not present

## 2014-12-03 ENCOUNTER — Ambulatory Visit: Payer: BLUE CROSS/BLUE SHIELD | Admitting: Internal Medicine

## 2014-12-03 DIAGNOSIS — Z471 Aftercare following joint replacement surgery: Secondary | ICD-10-CM | POA: Diagnosis not present

## 2014-12-03 DIAGNOSIS — Z96652 Presence of left artificial knee joint: Secondary | ICD-10-CM | POA: Diagnosis not present

## 2014-12-04 ENCOUNTER — Telehealth: Payer: Self-pay | Admitting: Internal Medicine

## 2014-12-04 NOTE — Telephone Encounter (Signed)
Pt call to say that her machine is broken and need a new one   glucose blood [place] [facility] [address]

## 2014-12-05 MED ORDER — ACCU-CHEK AVIVA PLUS W/DEVICE KIT: PACK | Status: DC

## 2014-12-05 NOTE — Telephone Encounter (Signed)
Pt notified Rx for new meter sent to pharmacy.

## 2014-12-09 ENCOUNTER — Other Ambulatory Visit: Payer: Self-pay | Admitting: Internal Medicine

## 2014-12-24 ENCOUNTER — Ambulatory Visit: Payer: BLUE CROSS/BLUE SHIELD | Admitting: Internal Medicine

## 2014-12-25 DIAGNOSIS — M5136 Other intervertebral disc degeneration, lumbar region: Secondary | ICD-10-CM | POA: Diagnosis not present

## 2015-01-09 DIAGNOSIS — M5090 Cervical disc disorder, unspecified, unspecified cervical region: Secondary | ICD-10-CM | POA: Diagnosis not present

## 2015-01-09 DIAGNOSIS — M5136 Other intervertebral disc degeneration, lumbar region: Secondary | ICD-10-CM | POA: Diagnosis not present

## 2015-01-14 ENCOUNTER — Encounter: Payer: Self-pay | Admitting: Internal Medicine

## 2015-01-14 ENCOUNTER — Ambulatory Visit (INDEPENDENT_AMBULATORY_CARE_PROVIDER_SITE_OTHER): Payer: Medicare Other | Admitting: Internal Medicine

## 2015-01-14 VITALS — BP 140/80 | HR 82 | Temp 97.7°F | Resp 20 | Ht 63.0 in | Wt 201.0 lb

## 2015-01-14 DIAGNOSIS — Z23 Encounter for immunization: Secondary | ICD-10-CM

## 2015-01-14 DIAGNOSIS — I10 Essential (primary) hypertension: Secondary | ICD-10-CM

## 2015-01-14 DIAGNOSIS — M150 Primary generalized (osteo)arthritis: Secondary | ICD-10-CM | POA: Diagnosis not present

## 2015-01-14 DIAGNOSIS — E119 Type 2 diabetes mellitus without complications: Secondary | ICD-10-CM | POA: Diagnosis not present

## 2015-01-14 DIAGNOSIS — E039 Hypothyroidism, unspecified: Secondary | ICD-10-CM | POA: Diagnosis not present

## 2015-01-14 DIAGNOSIS — E669 Obesity, unspecified: Secondary | ICD-10-CM

## 2015-01-14 DIAGNOSIS — M159 Polyosteoarthritis, unspecified: Secondary | ICD-10-CM

## 2015-01-14 MED ORDER — PHENTERMINE HCL 37.5 MG PO CAPS: 37.5000 mg | ORAL_CAPSULE | ORAL | Status: DC

## 2015-01-14 NOTE — Progress Notes (Signed)
Pre visit review using our clinic review tool, if applicable. No additional management support is needed unless otherwise documented below in the visit note. 

## 2015-01-14 NOTE — Patient Instructions (Signed)
Limit your sodium (Salt) intake   Please check your hemoglobin A1c every 3 months    It is important that you exercise regularly, at least 20 minutes 3 to 4 times per week.  If you develop chest pain or shortness of breath seek  medical attention.  You need to lose weight.  Consider a lower calorie diet and regular exercise. 

## 2015-01-14 NOTE — Progress Notes (Signed)
Subjective:    Patient ID: Leslie Benton, female    DOB: 12/25/1940, 74 y.o.   MRN: [id number]  HPI  Lab Results  Component Value Date   HGBA1C 7.7* 11/26/2014    BP Readings from Last 3 Encounters:  01/14/15 140/80  11/26/14 168/80  09/28/14 141/63    Wt Readings from Last 3 Encounters:  01/14/15 201 lb (91.173 kg)  11/26/14 200 lb (90.719 kg)  09/28/14 210 lb 9.6 oz (95.82 kg)   74 year old patient who is seen today for follow-up.  Hemoglobin A1c 6 weeks ago was 7.7.  She has been very noncompliant with her medication.  She is on metformin therapy once daily, but unclear whether she takes this medication daily.  She has self discontinued pioglitazone and SU therapy.  She states that she does not feel well on this medication but has no specific side effects Complaints today include neck pain.  She is followed closely by orthopedics  She does state the blood sugars range from 120-160  She is scheduled for a rehabilitation.  Her knee continues to be stable.  Past Medical History  Diagnosis Date  . COLONIC POLYPS, HX OF 02/07/2008  . DIABETES MELLITUS, TYPE II 10/27/2009  . GERD 10/14/2006  . HYPERLIPIDEMIA 10/14/2006  . HYPOTHYROIDISM 10/14/2006  . LOW BACK PAIN 08/04/2007  . Obesity   . DEGENERATIVE JOINT DISEASE 10/14/2006    03/31/11: Pt states arthritis in her hip.  . Anxiety   . Depression   . HYPERTENSION 10/14/2006    off bp meds now  . Anemia     Social History   Social History  . Marital Status: Married    Spouse Name: N/A  . Number of Children: 2  . Years of Education: N/A   Occupational History  . Not on file.   Social History Main Topics  . Smoking status: Never Smoker   . Smokeless tobacco: Never Used     Comment: Never Used Tobacco  . Alcohol Use: No  . Drug Use: No  . Sexual Activity: Not on file   Other Topics Concern  . Not on file   Social History Narrative   Lives with husband and son.      Past Surgical History  Procedure  Laterality Date  . Hemorrhoid surgery    . Tonsillectomy    . Left knee arthroscopic  April 2014    Dr. Durward Fortes  . Tubal ligation    . Partial knee arthroplasty Left 09/23/2014    Procedure: LEFT UNICOMPARTMENTAL KNEE;  Surgeon: [name], MD;  Location: [facility];  Service: Orthopedics;  Laterality: Left;    Family History  Problem Relation Age of Onset  . Heart attack Father 20  . Kidney failure Mother     Allergies  Allergen Reactions  . Niacin And Related Hives and Swelling    No anaphalaxis, but strong reactions.  . Lactose Intolerance (Gi)     "bad taste in mouth"  . Amoxicillin Swelling    hives  . Penicillins Swelling    Hives With all cillins    Current Outpatient Prescriptions on File Prior to Visit  Medication Sig Dispense Refill  . acetaminophen (TYLENOL) 500 MG tablet Take 500 mg by mouth every 6 (six) hours as needed for moderate pain or headache.    Marland Kitchen aspirin EC 81 MG tablet Take 81 mg by mouth daily.    . Blood Glucose Monitoring Suppl (ACCU-CHEK AVIVA PLUS) W/DEVICE KIT USE AS DIRECTED TO CHECK  BLOOD SUGARS 1 kit 0  . glucose blood (ACCU-CHEK AVIVA) test strip USE TO CHECK BLOOD SUGAR DAILY AND PRN 100 each 12  . Lancets (ACCU-CHEK MULTICLIX) lancets USE TO CHECK BLOOD SUGAR DAILY AND PRN 100 each 4  . lisinopril (PRINIVIL,ZESTRIL) 20 MG tablet Take 1 tablet (20 mg total) by mouth daily. 90 tablet 3  . metFORMIN (GLUCOPHAGE) 1000 MG tablet TAKE 1 TABLET BY MOUTH DAILY WITH BREAKFAST 90 tablet 1  . pioglitazone (ACTOS) 30 MG tablet Take 1 tablet (30 mg total) by mouth daily. 90 tablet 0  . sodium chloride (MURO 128) 2 % ophthalmic solution Place 1-2 drops into both eyes daily as needed (dry eye).     No current facility-administered medications on file prior to visit.    BP 140/80 mmHg  Pulse 82  Temp(Src) 97.7 F (36.5 C) (Oral)  Resp 20  Ht 5' 3"  (1.6 m)  Wt 201 lb (91.173 kg)  BMI 35.61 kg/m2  SpO2 97%      Review of Systems    Constitutional: Negative.   HENT: Negative for congestion, dental problem, hearing loss, rhinorrhea, sinus pressure, sore throat and tinnitus.   Eyes: Negative for pain, discharge and visual disturbance.  Respiratory: Negative for cough and shortness of breath.   Cardiovascular: Negative for chest pain, palpitations and leg swelling.  Gastrointestinal: Negative for nausea, vomiting, abdominal pain, diarrhea, constipation, blood in stool and abdominal distention.  Genitourinary: Negative for dysuria, urgency, frequency, hematuria, flank pain, vaginal bleeding, vaginal discharge, difficulty urinating, vaginal pain and pelvic pain.  Musculoskeletal: Positive for neck pain and neck stiffness. Negative for joint swelling, arthralgias and gait problem.  Skin: Negative for rash.  Neurological: Negative for dizziness, syncope, speech difficulty, weakness, numbness and headaches.  Hematological: Negative for adenopathy.  Psychiatric/Behavioral: Negative for behavioral problems, dysphoric mood and agitation. The patient is not nervous/anxious.        Objective:   Physical Exam  Constitutional: She is oriented to person, place, and time. She appears well-developed and well-nourished.  HENT:  Head: Normocephalic.  Right Ear: External ear normal.  Left Ear: External ear normal.  Mouth/Throat: Oropharynx is clear and moist.  Eyes: Conjunctivae and EOM are normal. Pupils are equal, round, and reactive to light.  Neck: Neck supple. No thyromegaly present.  Range of motion the neck slightly uncomfortable  Cardiovascular: Normal rate, regular rhythm, normal heart sounds and intact distal pulses.   Pulmonary/Chest: Effort normal and breath sounds normal.  Abdominal: Soft. Bowel sounds are normal. She exhibits no mass. There is no tenderness.  Musculoskeletal: Normal range of motion.  Lymphadenopathy:    She has no cervical adenopathy.  Neurological: She is alert and oriented to person, place, and  time.  Skin: Skin is warm and dry. No rash noted.  Psychiatric: She has a normal mood and affect. Her behavior is normal.          Assessment & Plan:    Diabetes mellitus.  Compliance stressed.  Will continue home blood sugar monitoring.  Will repeat hemoglobin A1c in 2 months Obesity.  Phentermine at her request was refilled.  She was told that if she does not have significant improvement with her weight, this medication will not be continued Hypertension, stable Osteoarthritis Neck pain

## 2015-01-16 ENCOUNTER — Telehealth: Payer: Self-pay | Admitting: *Deleted

## 2015-01-16 NOTE — Telephone Encounter (Signed)
Yes ok to split

## 2015-01-16 NOTE — Telephone Encounter (Signed)
Spoke to pt told her okay to split the Metformin 500 mg in AM and 500 mg in PM per Dr.K. Pt verbalized understanding.

## 2015-01-16 NOTE — Telephone Encounter (Signed)
Received message from pt wanting to know if she can split her dosage of Metformin 500 mg in AM and 500 mg in PM. Call her back and let her know.

## 2015-01-16 NOTE — Telephone Encounter (Signed)
Dr. K, please see message and advise. 

## 2015-01-21 DIAGNOSIS — M5090 Cervical disc disorder, unspecified, unspecified cervical region: Secondary | ICD-10-CM | POA: Diagnosis not present

## 2015-01-22 ENCOUNTER — Encounter: Payer: Self-pay | Admitting: Adult Health

## 2015-01-22 ENCOUNTER — Ambulatory Visit (INDEPENDENT_AMBULATORY_CARE_PROVIDER_SITE_OTHER): Payer: Medicare Other | Admitting: Adult Health

## 2015-01-22 ENCOUNTER — Ambulatory Visit: Payer: BLUE CROSS/BLUE SHIELD | Admitting: Family Medicine

## 2015-01-22 VITALS — BP 150/80 | HR 82 | Temp 98.4°F | Ht 63.0 in | Wt 200.6 lb

## 2015-01-22 DIAGNOSIS — R599 Enlarged lymph nodes, unspecified: Secondary | ICD-10-CM

## 2015-01-22 DIAGNOSIS — R591 Generalized enlarged lymph nodes: Secondary | ICD-10-CM | POA: Diagnosis not present

## 2015-01-22 NOTE — Progress Notes (Signed)
Pre visit review using our clinic review tool, if applicable. No additional management support is needed unless otherwise documented below in the visit note. 

## 2015-01-22 NOTE — Progress Notes (Signed)
Subjective:    Patient ID: Leslie Benton, female    DOB: Aug 26, 1940, 74 y.o.   MRN: [id number]  HPI   74 year old female who presents to the office today for " swollen gland" x 1 week. Endorses that it is worse in the evening. Denies fever, ear pain or throat pain. No sinus congestion. No issue with swallowing.   Is being treated by Dr. Nelva Bush for arthritis in the neck,    Review of Systems  Constitutional: Negative.   HENT: Negative.   Hematological: Positive for adenopathy.  All other systems reviewed and are negative.  Past Medical History  Diagnosis Date  . COLONIC POLYPS, HX OF 02/07/2008  . DIABETES MELLITUS, TYPE II 10/27/2009  . GERD 10/14/2006  . HYPERLIPIDEMIA 10/14/2006  . HYPOTHYROIDISM 10/14/2006  . LOW BACK PAIN 08/04/2007  . Obesity   . DEGENERATIVE JOINT DISEASE 10/14/2006    03/31/11: Pt states arthritis in her hip.  . Anxiety   . Depression   . HYPERTENSION 10/14/2006    off bp meds now  . Anemia     Social History   Social History  . Marital Status: Married    Spouse Name: N/A  . Number of Children: 2  . Years of Education: N/A   Occupational History  . Not on file.   Social History Main Topics  . Smoking status: Never Smoker   . Smokeless tobacco: Never Used     Comment: Never Used Tobacco  . Alcohol Use: No  . Drug Use: No  . Sexual Activity: Not on file   Other Topics Concern  . Not on file   Social History Narrative   Lives with husband and son.      Past Surgical History  Procedure Laterality Date  . Hemorrhoid surgery    . Tonsillectomy    . Left knee arthroscopic  April 2014    Dr. Durward Fortes  . Tubal ligation    . Partial knee arthroplasty Left 09/23/2014    Procedure: LEFT UNICOMPARTMENTAL KNEE;  Surgeon: [name], MD;  Location: [facility];  Service: Orthopedics;  Laterality: Left;    Family History  Problem Relation Age of Onset  . Heart attack Father 25  . Kidney failure Mother     Allergies  Allergen Reactions  .  Niacin And Related Hives and Swelling    No anaphalaxis, but strong reactions.  . Lactose Intolerance (Gi)     "bad taste in mouth"  . Amoxicillin Swelling    hives  . Penicillins Swelling    Hives With all cillins    Current Outpatient Prescriptions on File Prior to Visit  Medication Sig Dispense Refill  . acetaminophen (TYLENOL) 500 MG tablet Take 500 mg by mouth every 6 (six) hours as needed for moderate pain or headache.    Marland Kitchen aspirin EC 81 MG tablet Take 81 mg by mouth daily.    . Blood Glucose Monitoring Suppl (ACCU-CHEK AVIVA PLUS) W/DEVICE KIT USE AS DIRECTED TO CHECK BLOOD SUGARS 1 kit 0  . glucose blood (ACCU-CHEK AVIVA) test strip USE TO CHECK BLOOD SUGAR DAILY AND PRN 100 each 12  . Lancets (ACCU-CHEK MULTICLIX) lancets USE TO CHECK BLOOD SUGAR DAILY AND PRN 100 each 4  . levothyroxine (SYNTHROID, LEVOTHROID) 150 MCG tablet Take 150 mcg by mouth daily before breakfast.   0  . lisinopril (PRINIVIL,ZESTRIL) 20 MG tablet Take 1 tablet (20 mg total) by mouth daily. 90 tablet 3  . metFORMIN (GLUCOPHAGE) 1000  MG tablet TAKE 1 TABLET BY MOUTH DAILY WITH BREAKFAST 90 tablet 1  . phentermine 37.5 MG capsule Take 1 capsule (37.5 mg total) by mouth every morning. 90 capsule 0  . pioglitazone (ACTOS) 30 MG tablet Take 1 tablet (30 mg total) by mouth daily. 90 tablet 0  . sodium chloride (MURO 128) 2 % ophthalmic solution Place 1-2 drops into both eyes daily as needed (dry eye).     No current facility-administered medications on file prior to visit.    BP 150/80 mmHg  Pulse 82  Temp(Src) 98.4 F (36.9 C) (Oral)  Ht _0  (1.6 m)  Wt 200 lb 9.6 oz (90.992 kg)  BMI 35.54 kg/m2  SpO2 96%       Objective:   Physical Exam  Constitutional: She is oriented to person, place, and time. She appears well-nourished. No distress.  HENT:  Head: Normocephalic and atraumatic.  Right Ear: External ear normal.  Left Ear: External ear normal.  Nose: Nose normal.  Mouth/Throat: Oropharynx  is clear and moist. No oropharyngeal exudate.  Eyes: Conjunctivae are normal. Pupils are equal, round, and reactive to light. Right eye exhibits no discharge. Left eye exhibits no discharge.  Neck: Normal range of motion. Neck supple. No tracheal deviation present. No thyromegaly present.  No swollen lymph node felt. No swelling to neck or face  Lymphadenopathy:    She has no cervical adenopathy.  Neurological: She is alert and oriented to person, place, and time.  Skin: Skin is warm and dry. No rash noted. She is not diaphoretic. No erythema. No pallor.  Psychiatric: She has a normal mood and affect. Her behavior is normal. Judgment and thought content normal.  Nursing note and vitals reviewed.      Assessment & Plan:  1. Swollen lymph nodes - No swollen lymph node felt.  - She would like an abx at this time but I do not feel like it is warranted.  - Follow up if node becomes swollen

## 2015-01-28 DIAGNOSIS — M5090 Cervical disc disorder, unspecified, unspecified cervical region: Secondary | ICD-10-CM | POA: Diagnosis not present

## 2015-01-30 DIAGNOSIS — M5090 Cervical disc disorder, unspecified, unspecified cervical region: Secondary | ICD-10-CM | POA: Diagnosis not present

## 2015-02-04 DIAGNOSIS — M50321 Other cervical disc degeneration at C4-C5 level: Secondary | ICD-10-CM | POA: Diagnosis not present

## 2015-02-04 DIAGNOSIS — M50322 Other cervical disc degeneration at C5-C6 level: Secondary | ICD-10-CM | POA: Diagnosis not present

## 2015-02-06 DIAGNOSIS — M50321 Other cervical disc degeneration at C4-C5 level: Secondary | ICD-10-CM | POA: Diagnosis not present

## 2015-02-10 DIAGNOSIS — M5136 Other intervertebral disc degeneration, lumbar region: Secondary | ICD-10-CM | POA: Diagnosis not present

## 2015-02-10 DIAGNOSIS — M50322 Other cervical disc degeneration at C5-C6 level: Secondary | ICD-10-CM | POA: Diagnosis not present

## 2015-02-10 DIAGNOSIS — M50321 Other cervical disc degeneration at C4-C5 level: Secondary | ICD-10-CM | POA: Diagnosis not present

## 2015-02-12 DIAGNOSIS — H2513 Age-related nuclear cataract, bilateral: Secondary | ICD-10-CM | POA: Diagnosis not present

## 2015-02-12 DIAGNOSIS — E119 Type 2 diabetes mellitus without complications: Secondary | ICD-10-CM | POA: Diagnosis not present

## 2015-02-12 DIAGNOSIS — H40053 Ocular hypertension, bilateral: Secondary | ICD-10-CM | POA: Diagnosis not present

## 2015-02-24 ENCOUNTER — Telehealth: Payer: Self-pay | Admitting: *Deleted

## 2015-02-24 NOTE — Telephone Encounter (Signed)
Pt called to let us know that she is going to have an Epidural on her neck. Pt also said she received a call from the pharmacy that Rx for Lisinopril is ready for pickup. Pt said she is not taking it and did not know if she was suppose to. Told pt according to her visits she is suppose to be taking it and she needs to start it again right away cause her blood pressure was high last visit. Told pt she needs to take all her medications as prescribed. Pt verbalized understanding.

## 2015-03-05 DIAGNOSIS — M50322 Other cervical disc degeneration at C5-C6 level: Secondary | ICD-10-CM | POA: Diagnosis not present

## 2015-03-06 DIAGNOSIS — Z96652 Presence of left artificial knee joint: Secondary | ICD-10-CM | POA: Diagnosis not present

## 2015-03-06 DIAGNOSIS — Z471 Aftercare following joint replacement surgery: Secondary | ICD-10-CM | POA: Diagnosis not present

## 2015-03-20 ENCOUNTER — Other Ambulatory Visit: Payer: Self-pay | Admitting: Internal Medicine

## 2015-03-20 DIAGNOSIS — M47812 Spondylosis without myelopathy or radiculopathy, cervical region: Secondary | ICD-10-CM | POA: Diagnosis not present

## 2015-03-20 DIAGNOSIS — M5136 Other intervertebral disc degeneration, lumbar region: Secondary | ICD-10-CM | POA: Diagnosis not present

## 2015-03-20 DIAGNOSIS — M50322 Other cervical disc degeneration at C5-C6 level: Secondary | ICD-10-CM | POA: Diagnosis not present

## 2015-03-20 DIAGNOSIS — M50321 Other cervical disc degeneration at C4-C5 level: Secondary | ICD-10-CM | POA: Diagnosis not present

## 2015-04-03 ENCOUNTER — Ambulatory Visit (INDEPENDENT_AMBULATORY_CARE_PROVIDER_SITE_OTHER): Payer: Medicare Other | Admitting: Ophthalmology

## 2015-04-15 ENCOUNTER — Ambulatory Visit: Payer: BLUE CROSS/BLUE SHIELD | Admitting: Internal Medicine

## 2015-04-16 DIAGNOSIS — M47812 Spondylosis without myelopathy or radiculopathy, cervical region: Secondary | ICD-10-CM | POA: Diagnosis not present

## 2015-05-05 DIAGNOSIS — M50322 Other cervical disc degeneration at C5-C6 level: Secondary | ICD-10-CM | POA: Diagnosis not present

## 2015-05-05 DIAGNOSIS — M50321 Other cervical disc degeneration at C4-C5 level: Secondary | ICD-10-CM | POA: Diagnosis not present

## 2015-05-05 DIAGNOSIS — M47812 Spondylosis without myelopathy or radiculopathy, cervical region: Secondary | ICD-10-CM | POA: Diagnosis not present

## 2015-05-05 DIAGNOSIS — M5136 Other intervertebral disc degeneration, lumbar region: Secondary | ICD-10-CM | POA: Diagnosis not present

## 2015-05-06 ENCOUNTER — Encounter: Payer: Self-pay | Admitting: Internal Medicine

## 2015-05-06 ENCOUNTER — Ambulatory Visit (INDEPENDENT_AMBULATORY_CARE_PROVIDER_SITE_OTHER): Payer: Medicare Other | Admitting: Internal Medicine

## 2015-05-06 ENCOUNTER — Telehealth: Payer: Self-pay | Admitting: Internal Medicine

## 2015-05-06 VITALS — BP 156/80 | HR 78 | Temp 98.4°F | Resp 20 | Ht 63.0 in | Wt 200.0 lb

## 2015-05-06 DIAGNOSIS — M542 Cervicalgia: Secondary | ICD-10-CM

## 2015-05-06 DIAGNOSIS — E039 Hypothyroidism, unspecified: Secondary | ICD-10-CM | POA: Diagnosis not present

## 2015-05-06 DIAGNOSIS — I10 Essential (primary) hypertension: Secondary | ICD-10-CM

## 2015-05-06 DIAGNOSIS — E119 Type 2 diabetes mellitus without complications: Secondary | ICD-10-CM | POA: Diagnosis not present

## 2015-05-06 DIAGNOSIS — M159 Polyosteoarthritis, unspecified: Secondary | ICD-10-CM

## 2015-05-06 DIAGNOSIS — M150 Primary generalized (osteo)arthritis: Secondary | ICD-10-CM

## 2015-05-06 LAB — HEMOGLOBIN A1C: HEMOGLOBIN A1C: 9.5 % — AB (ref 4.6–6.5)

## 2015-05-06 MED ORDER — PHENTERMINE HCL 37.5 MG PO CAPS: 37.5000 mg | ORAL_CAPSULE | Freq: Every morning | ORAL | Status: DC

## 2015-05-06 MED ORDER — PIOGLITAZONE HCL 30 MG PO TABS: 30.0000 mg | ORAL_TABLET | Freq: Every day | ORAL | Status: DC

## 2015-05-06 NOTE — Telephone Encounter (Signed)
Pt request refill of the following: pioglitazone (ACTOS) 30 MG tablet  Pt never picked up the above medicine and said she was told that she should be taking this med. There is no refills can this be refilled    I contacted the pharmacy for the pt and they said she has not picked up the above med since 11/2013     Phamacy: [facility]

## 2015-05-06 NOTE — Progress Notes (Signed)
   Subjective:    Patient ID: Leslie Benton, female    DOB: Apr 22, 1940, 75 y.o.   MRN: [id number]  HPI  Lab Results  Component Value Date   HGBA1C 7.7* 11/26/2014   BP Readings from Last 3 Encounters:  05/06/15 156/80  01/22/15 150/80  01/14/15 140/80     Review of Systems     Objective:   Physical Exam        Assessment & Plan:

## 2015-05-06 NOTE — Patient Instructions (Signed)
Please check your hemoglobin A1c every 3 months  Limit your sodium (Salt) intake  Cervical Sprain A cervical sprain is an injury in the neck in which the strong, fibrous tissues (ligaments) that connect your neck bones stretch or tear. Cervical sprains can range from mild to severe. Severe cervical sprains can cause the neck vertebrae to be unstable. This can lead to damage of the spinal cord and can result in serious nervous system problems. The amount of time it takes for a cervical sprain to get better depends on the cause and extent of the injury. Most cervical sprains heal in 1 to 3 weeks. CAUSES  Severe cervical sprains may be caused by:   Contact sport injuries (such as from football, rugby, wrestling, hockey, auto racing, gymnastics, diving, martial arts, or boxing).   Motor vehicle collisions.   Whiplash injuries. This is an injury from a sudden forward and backward whipping movement of the head and neck.  Falls.  Mild cervical sprains may be caused by:   Being in an awkward position, such as while cradling a telephone between your ear and shoulder.   Sitting in a chair that does not offer proper support.   Working at a poorly Landscape architect station.   Looking up or down for long periods of time.  SYMPTOMS   Pain, soreness, stiffness, or a burning sensation in the front, back, or sides of the neck. This discomfort may develop immediately after the injury or slowly, 24 hours or more after the injury.   Pain or tenderness directly in the middle of the back of the neck.   Shoulder or upper back pain.   Limited ability to move the neck.   Headache.   Dizziness.   Weakness, numbness, or tingling in the hands or arms.   Muscle spasms.   Difficulty swallowing or chewing.   Tenderness and swelling of the neck.  DIAGNOSIS  Most of the time your health care provider can diagnose a cervical sprain by taking your history and doing a physical exam.  Your health care provider will ask about previous neck injuries and any known neck problems, such as arthritis in the neck. X-rays may be taken to find out if there are any other problems, such as with the bones of the neck. Other tests, such as a CT scan or MRI, may also be needed.  TREATMENT  Treatment depends on the severity of the cervical sprain. Mild sprains can be treated with rest, keeping the neck in place (immobilization), and pain medicines. Severe cervical sprains are immediately immobilized. Further treatment is done to help with pain, muscle spasms, and other symptoms and may include:  Medicines, such as pain relievers, numbing medicines, or muscle relaxants.   Physical therapy. This may involve stretching exercises, strengthening exercises, and posture training. Exercises and improved posture can help stabilize the neck, strengthen muscles, and help stop symptoms from returning.  HOME CARE INSTRUCTIONS   Put ice on the injured area.   Put ice in a plastic bag.   Place a towel between your skin and the bag.   Leave the ice on for 15-20 minutes, 3-4 times a day.   If your injury was severe, you may have been given a cervical collar to wear. A cervical collar is a two-piece collar designed to keep your neck from moving while it heals.  Do not remove the collar unless instructed by your health care provider.  If you have long hair, keep it outside of the  collar.  Ask your health care provider before making any adjustments to your collar. Minor adjustments may be required over time to improve comfort and reduce pressure on your chin or on the back of your head.  Ifyou are allowed to remove the collar for cleaning or bathing, follow your health care provider's instructions on how to do so safely.  Keep your collar clean by wiping it with mild soap and water and drying it completely. If the collar you have been given includes removable pads, remove them every 1-2 days and  hand wash them with soap and water. Allow them to air dry. They should be completely dry before you wear them in the collar.  If you are allowed to remove the collar for cleaning and bathing, wash and dry the skin of your neck. Check your skin for irritation or sores. If you see any, tell your health care provider.  Do not drive while wearing the collar.   Only take over-the-counter or prescription medicines for pain, discomfort, or fever as directed by your health care provider.   Keep all follow-up appointments as directed by your health care provider.   Keep all physical therapy appointments as directed by your health care provider.   Make any needed adjustments to your workstation to promote good posture.   Avoid positions and activities that make your symptoms worse.   Warm up and stretch before being active to help prevent problems.  SEEK MEDICAL CARE IF:   Your pain is not controlled with medicine.   You are unable to decrease your pain medicine over time as planned.   Your activity level is not improving as expected.  SEEK IMMEDIATE MEDICAL CARE IF:   You develop any bleeding.  You develop stomach upset.  You have signs of an allergic reaction to your medicine.   Your symptoms get worse.   You develop new, unexplained symptoms.   You have numbness, tingling, weakness, or paralysis in any part of your body.  MAKE SURE YOU:   Understand these instructions.  Will watch your condition.  Will get help right away if you are not doing well or get worse.   This information is not intended to replace advice given to you by your health care provider. Make sure you discuss any questions you have with your health care provider.   Document Released: 01/31/2007 Document Revised: 04/10/2013 Document Reviewed: 10/11/2012 Elsevier Interactive Patient Education [employer].

## 2015-05-06 NOTE — Progress Notes (Signed)
Subjective:    Patient ID: Leslie Benton, female    DOB: Aug 27, 1940, 75 y.o.   MRN: [id number]  HPI  75 year old patient who is seen today for follow-up of type 2 diabetes.  It is unclear whether she is taking pioglitazone.  Lab Results  Component Value Date   HGBA1C 7.7* 11/26/2014    Her main complaint is neck pain.  This seems to be in the right posterior lateral neck area.  She states this has been an issue since August of last year.  She has been seen by orthopedics and referred to Dr. Jeanell Sparrow most.  She has had injection therapy, massage therapy, physical therapy and most recently has seen an acupuncturist.  She continues to have discomfort. She has treated hypertension and hypothyroidism.  Wt Readings from Last 3 Encounters:  05/06/15 200 lb (90.719 kg)  01/22/15 200 lb 9.6 oz (90.992 kg)  01/14/15 201 lb (91.173 kg)    Past Medical History  Diagnosis Date  . COLONIC POLYPS, HX OF 02/07/2008  . DIABETES MELLITUS, TYPE II 10/27/2009  . GERD 10/14/2006  . HYPERLIPIDEMIA 10/14/2006  . HYPOTHYROIDISM 10/14/2006  . LOW BACK PAIN 08/04/2007  . Obesity   . DEGENERATIVE JOINT DISEASE 10/14/2006    03/31/11: Pt states arthritis in her hip.  . Anxiety   . Depression   . HYPERTENSION 10/14/2006    off bp meds now  . Anemia     Social History   Social History  . Marital Status: Married    Spouse Name: N/A  . Number of Children: 2  . Years of Education: N/A   Occupational History  . Not on file.   Social History Main Topics  . Smoking status: Never Smoker   . Smokeless tobacco: Never Used     Comment: Never Used Tobacco  . Alcohol Use: No  . Drug Use: No  . Sexual Activity: Not on file   Other Topics Concern  . Not on file   Social History Narrative   Lives with husband and son.      Past Surgical History  Procedure Laterality Date  . Hemorrhoid surgery    . Tonsillectomy    . Left knee arthroscopic  April 2014    Dr. Durward Fortes  . Tubal ligation    . Partial  knee arthroplasty Left 09/23/2014    Procedure: LEFT UNICOMPARTMENTAL KNEE;  Surgeon: [name], MD;  Location: [facility];  Service: Orthopedics;  Laterality: Left;    Family History  Problem Relation Age of Onset  . Heart attack Father 35  . Kidney failure Mother     Allergies  Allergen Reactions  . Niacin And Related Hives and Swelling    No anaphalaxis, but strong reactions.  . Lactose Intolerance (Gi)     "bad taste in mouth"  . Amoxicillin Swelling    hives  . Penicillins Swelling    Hives With all cillins    Current Outpatient Prescriptions on File Prior to Visit  Medication Sig Dispense Refill  . acetaminophen (TYLENOL) 500 MG tablet Take 500 mg by mouth every 6 (six) hours as needed for moderate pain or headache.    . Blood Glucose Monitoring Suppl (ACCU-CHEK AVIVA PLUS) W/DEVICE KIT USE AS DIRECTED TO CHECK BLOOD SUGARS 1 kit 0  . glucose blood (ACCU-CHEK AVIVA) test strip USE TO CHECK BLOOD SUGAR DAILY AND PRN 100 each 12  . Lancets (ACCU-CHEK MULTICLIX) lancets USE TO CHECK BLOOD SUGAR DAILY AND PRN 100 each 4  .  levothyroxine (SYNTHROID, LEVOTHROID) 150 MCG tablet Take 150 mcg by mouth daily before breakfast.   0  . lisinopril (PRINIVIL,ZESTRIL) 20 MG tablet Take 1 tablet (20 mg total) by mouth daily. 90 tablet 3  . metFORMIN (GLUCOPHAGE) 1000 MG tablet TAKE 1 TABLET BY MOUTH DAILY WITH BREAKFAST 90 tablet 1  . phentermine 37.5 MG capsule take 1 capsule by mouth every morning 90 capsule 1  . sodium chloride (MURO 128) 2 % ophthalmic solution Place 1-2 drops into both eyes daily as needed (dry eye).    Marland Kitchen aspirin EC 81 MG tablet Take 81 mg by mouth daily. Reported on 05/06/2015    . pioglitazone (ACTOS) 30 MG tablet Take 1 tablet (30 mg total) by mouth daily. (Patient not taking: Reported on 05/06/2015) 90 tablet 0   No current facility-administered medications on file prior to visit.    BP 156/80 mmHg  Pulse 78  Temp(Src) 98.4 F (36.9 C) (Oral)  Resp 20  Ht 5' 3"   (1.6 m)  Wt 200 lb (90.719 kg)  BMI 35.44 kg/m2  SpO2 98%     Review of Systems  Constitutional: Negative.   HENT: Negative for congestion, dental problem, hearing loss, rhinorrhea, sinus pressure, sore throat and tinnitus.   Eyes: Negative for pain, discharge and visual disturbance.  Respiratory: Negative for cough and shortness of breath.   Cardiovascular: Negative for chest pain, palpitations and leg swelling.  Gastrointestinal: Negative for nausea, vomiting, abdominal pain, diarrhea, constipation, blood in stool and abdominal distention.  Genitourinary: Negative for dysuria, urgency, frequency, hematuria, flank pain, vaginal bleeding, vaginal discharge, difficulty urinating, vaginal pain and pelvic pain.  Musculoskeletal: Positive for neck pain and neck stiffness. Negative for joint swelling, arthralgias and gait problem.  Skin: Negative for rash.  Neurological: Negative for dizziness, syncope, speech difficulty, weakness, numbness and headaches.  Hematological: Negative for adenopathy.  Psychiatric/Behavioral: Negative for behavioral problems, dysphoric mood and agitation. The patient is not nervous/anxious.        Objective:   Physical Exam  Constitutional: She is oriented to person, place, and time. She appears well-developed and well-nourished.  Blood pressure 140/80  HENT:  Head: Normocephalic.  Right Ear: External ear normal.  Left Ear: External ear normal.  Mouth/Throat: Oropharynx is clear and moist.  Eyes: Conjunctivae and EOM are normal. Pupils are equal, round, and reactive to light.  Neck: Neck supple. No thyromegaly present.  Decreased range of motion of the neck except for flexion Slight tenderness in the right posterior lateral neck area  Cardiovascular: Normal rate, regular rhythm, normal heart sounds and intact distal pulses.   Pulmonary/Chest: Effort normal and breath sounds normal.  Abdominal: Soft. Bowel sounds are normal. She exhibits no mass. There is  no tenderness.  Musculoskeletal: Normal range of motion.  Lymphadenopathy:    She has no cervical adenopathy.  Neurological: She is alert and oriented to person, place, and time.  Skin: Skin is warm and dry. No rash noted.  Psychiatric: She has a normal mood and affect. Her behavior is normal.          Assessment & Plan:   Neck pain.  This has been refractory to many modalities including epidurals, physical therapy, massage therapy and more recently acupuncture treatments.  Soft cervical collar recommended Patient has been prescribed analgesics  Diabetes.  Will check hemoglobin A1c.  Unclear compliance.  Doubtful ration is taking Actos  Hypertension

## 2015-05-06 NOTE — Progress Notes (Signed)
Pre visit review using our clinic review tool, if applicable. No additional management support is needed unless otherwise documented below in the visit note. 

## 2015-05-06 NOTE — Telephone Encounter (Signed)
Rx sent to pharmacy   

## 2015-05-19 DIAGNOSIS — N644 Mastodynia: Secondary | ICD-10-CM | POA: Diagnosis not present

## 2015-05-20 ENCOUNTER — Telehealth: Payer: Self-pay | Admitting: *Deleted

## 2015-05-20 ENCOUNTER — Other Ambulatory Visit: Payer: Self-pay | Admitting: Obstetrics and Gynecology

## 2015-05-20 DIAGNOSIS — N644 Mastodynia: Secondary | ICD-10-CM

## 2015-05-20 NOTE — Telephone Encounter (Signed)
Pt called and left message on my voicemail would like to see someone for her Neck Pain a Neurologist. Please advise.

## 2015-05-20 NOTE — Telephone Encounter (Signed)
Notify patient that this is not a neurology problem.  Follow-up with orthopedics

## 2015-05-21 NOTE — Telephone Encounter (Signed)
Spoke to pt, told her this is not a Neurology problem and need to follow up with Orthopedics per Dr.K. Pt verbalized understanding but said she is going to see Dr.Ramos is waiting to schedule an appt. Told her okay.

## 2015-05-27 ENCOUNTER — Other Ambulatory Visit: Payer: BLUE CROSS/BLUE SHIELD

## 2015-05-29 ENCOUNTER — Encounter: Payer: Self-pay | Admitting: Neurology

## 2015-05-29 ENCOUNTER — Ambulatory Visit (INDEPENDENT_AMBULATORY_CARE_PROVIDER_SITE_OTHER): Payer: Medicare Other | Admitting: Neurology

## 2015-05-29 VITALS — BP 154/83 | HR 103 | Ht 63.0 in | Wt 198.5 lb

## 2015-05-29 DIAGNOSIS — M47812 Spondylosis without myelopathy or radiculopathy, cervical region: Secondary | ICD-10-CM

## 2015-05-29 DIAGNOSIS — G4486 Cervicogenic headache: Secondary | ICD-10-CM | POA: Insufficient documentation

## 2015-05-29 DIAGNOSIS — R51 Headache: Secondary | ICD-10-CM | POA: Diagnosis not present

## 2015-05-29 HISTORY — DX: Spondylosis without myelopathy or radiculopathy, cervical region: M47.812

## 2015-05-29 MED ORDER — GABAPENTIN 100 MG PO CAPS: 100.0000 mg | ORAL_CAPSULE | Freq: Three times a day (TID) | ORAL | Status: DC

## 2015-05-29 NOTE — Patient Instructions (Signed)
   Neurontin (gabapentin) may result in drowsiness, ankle swelling, gait instability, or possibly dizziness. Please contact our office if significant side effects occur with this medication.  

## 2015-05-29 NOTE — Progress Notes (Signed)
Reason for visit: Neck pain  Referring physician: Dr. Anna Genre is a 75 y.o. female  History of present illness:  [name] is a 75 year old left-handed white female with a history of neck pain that began in August 2016. The patient was doing yard work at that time, and she believes that she strained her right neck and shoulder. The patient has had constant neuromuscular discomfort since that time. She reports pain in the right shoulder, right neck, with some headaches coming up in the right side of the head. He denies any discomfort or numbness going down the right arm. She reports no weakness of the extremities, or change in balance. She denies any issues controlling the bowels or the bladder. She has been taking Tylenol for the pain without much benefit. She has been seen by Dr. Nelva Bush for the ongoing discomfort, and he has done what sounds like epidural steroid injections without benefit. The patient has undergone physical therapy without benefit. She has done acupuncture and massage therapy. She is considering seeing a Restaurant manager, fast food. MRI evaluation of the cervical spine has not been done. Cervical spine x-rays have been done, these were reviewed on disc today, showing multilevel spondylosis. The patient has neck stiffness and limitation of movement of the neck. She is sent to this office for further evaluation.  Past Medical History  Diagnosis Date  . COLONIC POLYPS, HX OF 02/07/2008  . DIABETES MELLITUS, TYPE II 10/27/2009  . GERD 10/14/2006  . HYPERLIPIDEMIA 10/14/2006  . HYPOTHYROIDISM 10/14/2006  . LOW BACK PAIN 08/04/2007  . Obesity   . DEGENERATIVE JOINT DISEASE 10/14/2006    03/31/11: Pt states arthritis in her hip.  . Anxiety   . Depression   . HYPERTENSION 10/14/2006    off bp meds now  . Anemia   . Cervical spondylosis without myelopathy 05/29/2015    Past Surgical History  Procedure Laterality Date  . Hemorrhoid surgery    . Tonsillectomy    . Left knee  arthroscopic  April 2014    Dr. Durward Fortes  . Tubal ligation    . Partial knee arthroplasty Left 09/23/2014    Procedure: LEFT UNICOMPARTMENTAL KNEE;  Surgeon: [name], MD;  Location: [facility];  Service: Orthopedics;  Laterality: Left;    Family History  Problem Relation Age of Onset  . Heart attack Father 62  . Kidney failure Mother   . Cancer Sister     endometrial  . Cancer Brother     Adrenal    Social history:  reports that she has never smoked. She has never used smokeless tobacco. She reports that she does not drink alcohol or use illicit drugs.  Medications:  Prior to Admission medications   Medication Sig Start Date End Date Taking? Authorizing Provider  acetaminophen (TYLENOL) 500 MG tablet Take 500 mg by mouth every 6 (six) hours as needed for moderate pain or headache.    Historical Provider, MD  aspirin EC 81 MG tablet Take 81 mg by mouth daily. Reported on 05/06/2015    Historical Provider, MD  Blood Glucose Monitoring Suppl (ACCU-CHEK AVIVA PLUS) W/DEVICE KIT USE AS DIRECTED TO CHECK BLOOD SUGARS 12/05/14   Marletta Lor, MD  busPIRone (BUSPAR) 15 MG tablet Take 15 mg by mouth daily.     Historical Provider, MD  glucose blood (ACCU-CHEK AVIVA) test strip USE TO CHECK BLOOD SUGAR DAILY AND PRN 09/20/14   Marletta Lor, MD  Lancets (ACCU-CHEK MULTICLIX) lancets USE TO CHECK  BLOOD SUGAR DAILY AND PRN 09/23/14   Marletta Lor, MD  levothyroxine (SYNTHROID, LEVOTHROID) 150 MCG tablet Take 150 mcg by mouth daily before breakfast.  10/18/14   Historical Provider, MD  lisinopril (PRINIVIL,ZESTRIL) 20 MG tablet Take 1 tablet (20 mg total) by mouth daily. 11/26/14   Marletta Lor, MD  metFORMIN (GLUCOPHAGE) 1000 MG tablet TAKE 1 TABLET BY MOUTH DAILY WITH BREAKFAST 10/18/14   Marletta Lor, MD  phentermine 37.5 MG capsule Take 1 capsule (37.5 mg total) by mouth every morning. 05/06/15   Marletta Lor, MD  pioglitazone (ACTOS) 30 MG tablet Take 1 tablet (30  mg total) by mouth daily. 05/06/15   Marletta Lor, MD  sodium chloride (MURO 128) 2 % ophthalmic solution Place 1-2 drops into both eyes daily as needed (dry eye).    Historical Provider, MD      Allergies  Allergen Reactions  . Niacin And Related Hives and Swelling    No anaphalaxis, but strong reactions.  . Lactose Intolerance (Gi)     "bad taste in mouth"  . Amoxicillin Swelling    hives  . Penicillins Swelling    Hives With all cillins    ROS:  Out of a complete 14 system review of symptoms, the patient complains only of the following symptoms, and all other reviewed systems are negative.  Fatigue Blurred vision Headache, decreased energy Sleepiness  Blood pressure 154/83, pulse 103, height 5' 3"  (1.6 m), weight 198 lb 8 oz (90.039 kg).  Physical Exam  General: The patient is alert and cooperative at the time of the examination. The patient is moderately obese.  Eyes: Pupils are equal, round, and reactive to light. Discs are flat bilaterally.  Neck: The neck is supple, no carotid bruits are noted.  Respiratory: The respiratory examination is clear.  Cardiovascular: The cardiovascular examination reveals a regular rate and rhythm, no obvious murmurs or rubs are noted.   Neuromuscular: The patient lacks about 25 or 30 of lateral rotation of the cervical spine to the right, 20 to the left.  Skin: Extremities are without significant edema.  Neurologic Exam  Mental status: The patient is alert and oriented x 3 at the time of the examination. The patient has apparent normal recent and remote memory, with an apparently normal attention span and concentration ability.  Cranial nerves: Facial symmetry is present. There is good sensation of the face to pinprick and soft touch bilaterally. The strength of the facial muscles and the muscles to head turning and shoulder shrug are normal bilaterally. Speech is well enunciated, no aphasia or dysarthria is noted.  Extraocular movements are full. Visual fields are full. The tongue is midline, and the patient has symmetric elevation of the soft palate. No obvious hearing deficits are noted.  Motor: The motor testing reveals 5 over 5 strength of all 4 extremities. Good symmetric motor tone is noted throughout.  Sensory: Sensory testing is intact to pinprick, soft touch, vibration sensation, and position sense on all 4 extremities. No evidence of extinction is noted.  Coordination: Cerebellar testing reveals good finger-nose-finger and heel-to-shin bilaterally.  Gait and station: Gait is normal. Tandem gait is.slightly unsteady Romberg is negative. No drift is seen.  Reflexes: Deep tendon reflexes are symmetric and normal bilaterally. Toes are downgoing bilaterally.   Assessment/Plan:   1. Cervical spondylosis, chronic cervical spine pain  The patient has had chronic neck discomfort since the summer of 2016. The patient has not responded to physical therapy or  epidural steroid injections. The patient will be given a trial on gabapentin, she is encouraged to do regular stretching of the neck, she could potentially gain benefit from chiropractic therapies. She will follow-up in about 4 months. MRI evaluation of the cervical spine may be done in the future.  Jill Alexanders MD 05/29/2015 6:44 PM  [facility] [address] [place], [place] [address]  Phone [phone] Fax [phone]

## 2015-06-09 ENCOUNTER — Telehealth: Payer: Self-pay | Admitting: *Deleted

## 2015-06-09 NOTE — Telephone Encounter (Signed)
Returned pt's phone call. Pt said she is not getting to the bottom of her neck problem. Asked pt if she called Ortho like she was told? Pt said no she thinks she needs an MRI and that she saw Neurologist. Told pt remember Dr.K said this is not a neurology problem need to contact Ortho and they will order MRI if needed. Pt verbalized understanding.

## 2015-06-16 ENCOUNTER — Telehealth: Payer: Self-pay | Admitting: *Deleted

## 2015-06-16 NOTE — Telephone Encounter (Signed)
Spoke to pt, told her Dr.K said he does not recommend antibiotics prior to the Dentist. Pt said that the dentist said it is. Told her Dr.K said according to guidelines it is not necessary. Pt verbalized understanding.

## 2015-06-16 NOTE — Telephone Encounter (Signed)
Leslie Benton with Dr Christell Faith office called because they need a note, it can be hand written, with the information  Dr Raliegh Ip stated about her not needing antibiotics.  They would like it faxed to: Fax  [phone]  As soon as possible. Pt has appointment tomorrow at 10:30 am.  Phone: [phone]

## 2015-06-16 NOTE — Telephone Encounter (Signed)
Pt said her dentist tried to call in CLINDAMYCIN but her insurance will not allow the dentist said it had to come from her pcp . She call to ask if Dr Raliegh Ip will rx her the medicine   Pt said she need this today her appt is in the morning     [place]

## 2015-06-16 NOTE — Telephone Encounter (Signed)
Notify patient that indications for dental antibiotic prophylaxis have narrowed and she does not require antibiotics according to current guidelines

## 2015-06-16 NOTE — Telephone Encounter (Signed)
Letter faxed to Dr. Earlie Server office at [phone].

## 2015-06-16 NOTE — Telephone Encounter (Signed)
Dr.K, pt called to say she has a Dental appointment tomorrow and thinks she needs antibiotics prior to appointment. Please advise.

## 2015-06-19 DIAGNOSIS — M542 Cervicalgia: Secondary | ICD-10-CM | POA: Diagnosis not present

## 2015-06-23 DIAGNOSIS — M50322 Other cervical disc degeneration at C5-C6 level: Secondary | ICD-10-CM | POA: Diagnosis not present

## 2015-06-23 DIAGNOSIS — M5136 Other intervertebral disc degeneration, lumbar region: Secondary | ICD-10-CM | POA: Diagnosis not present

## 2015-06-23 DIAGNOSIS — M50321 Other cervical disc degeneration at C4-C5 level: Secondary | ICD-10-CM | POA: Diagnosis not present

## 2015-06-25 ENCOUNTER — Telehealth: Payer: Self-pay | Admitting: *Deleted

## 2015-06-25 NOTE — Telephone Encounter (Signed)
Spoke to pt, told her I got her message about sending lab work to [place]. Told her the only thing I have is your Hemoglobin A1c. Pt verbalized understanding and stated that is what he wants. Told her okay will fax lab results over to him. Pt verbalized understanding. Labs faxed to [facility] at [phone].

## 2015-07-01 ENCOUNTER — Ambulatory Visit (HOSPITAL_BASED_OUTPATIENT_CLINIC_OR_DEPARTMENT_OTHER): Payer: Medicare Other | Admitting: Family

## 2015-07-01 ENCOUNTER — Other Ambulatory Visit (HOSPITAL_BASED_OUTPATIENT_CLINIC_OR_DEPARTMENT_OTHER): Payer: Medicare Other

## 2015-07-01 ENCOUNTER — Ambulatory Visit (HOSPITAL_BASED_OUTPATIENT_CLINIC_OR_DEPARTMENT_OTHER): Payer: Medicare Other

## 2015-07-01 ENCOUNTER — Encounter: Payer: Self-pay | Admitting: Family

## 2015-07-01 ENCOUNTER — Other Ambulatory Visit: Payer: Self-pay | Admitting: Family

## 2015-07-01 VITALS — BP 149/68 | HR 93 | Temp 98.0°F | Resp 14 | Ht 63.0 in | Wt 200.0 lb

## 2015-07-01 DIAGNOSIS — D509 Iron deficiency anemia, unspecified: Secondary | ICD-10-CM

## 2015-07-01 DIAGNOSIS — D500 Iron deficiency anemia secondary to blood loss (chronic): Secondary | ICD-10-CM

## 2015-07-01 LAB — CBC WITH DIFFERENTIAL (CANCER CENTER ONLY)
BASO#: 0 10*3/uL (ref 0.0–0.2)
BASO%: 0.6 % (ref 0.0–2.0)
EOS ABS: 0.1 10*3/uL (ref 0.0–0.5)
EOS%: 1.1 % (ref 0.0–7.0)
HCT: 38.5 % (ref 34.8–46.6)
HEMOGLOBIN: 12.6 g/dL (ref 11.6–15.9)
LYMPH#: 2.6 10*3/uL (ref 0.9–3.3)
LYMPH%: 47.5 % (ref 14.0–48.0)
MCH: 28.2 pg (ref 26.0–34.0)
MCHC: 32.7 g/dL (ref 32.0–36.0)
MCV: 86 fL (ref 81–101)
MONO#: 0.4 10*3/uL (ref 0.1–0.9)
MONO%: 7 % (ref 0.0–13.0)
NEUT%: 43.8 % (ref 39.6–80.0)
NEUTROS ABS: 2.4 10*3/uL (ref 1.5–6.5)
Platelets: 341 10*3/uL (ref 145–400)
RBC: 4.47 10*6/uL (ref 3.70–5.32)
RDW: 13.9 % (ref 11.1–15.7)
WBC: 5.4 10*3/uL (ref 3.9–10.0)

## 2015-07-01 LAB — IRON AND TIBC
%SAT: 11 % — AB (ref 21–57)
IRON: 40 ug/dL — AB (ref 41–142)
TIBC: 384 ug/dL (ref 236–444)
UIBC: 343 ug/dL (ref 120–384)

## 2015-07-01 LAB — FERRITIN: FERRITIN: 12 ng/mL (ref 9–269)

## 2015-07-01 MED ORDER — SODIUM CHLORIDE 0.9 % IV SOLN
Freq: Once | INTRAVENOUS | Status: AC
  Administered 2015-07-01: 13:00:00 via INTRAVENOUS

## 2015-07-01 MED ORDER — SODIUM CHLORIDE 0.9 % IV SOLN
510.0000 mg | Freq: Once | INTRAVENOUS | Status: AC
  Administered 2015-07-01: 510 mg via INTRAVENOUS
  Filled 2015-07-01: qty 17

## 2015-07-01 NOTE — Patient Instructions (Signed)

## 2015-07-01 NOTE — Progress Notes (Signed)
Hematology and Oncology Follow Up Visit  Leslie Benton [id number] 1940-04-25 74 y.o. 07/01/2015   Principle Diagnosis:  Iron deficiency anemia  Current Therapy:   IV iron as indicated - last received April 2016    Interim History:  Leslie Benton is here today with c/o fatigue and weakness. She has had a lot of issues with her neck due to cervical spondylosis and degenerative disc disease in that area. She has received several steroid injections but has not gotten any relief. Unfortunately, she has not been satisfied with her neurosurgery team and is asking for a referral to someone new. I explained that since that is not why she sees is that she would need to go through her PCP for a referral. We did suggest Dr. Newman Pies. She has had no fever, chills, n/v, cough,r ash, dizziness, headache, ice cravings, SOB, chest pain, palpitations, abdominal pain or changes in bowel or bladder habits.  She denies any episodes of bleeding or bruising. No blood in her stool.  She is overdue for a colonoscopy and will need to follow-up with [facility] per their letter request.  She states that her knee surgery went well and she finished PT. No swelling or tenderness in her extremities. She has mild numbness and tingling in her hands and feet at times that comes and goes. No falls or syncopal episodes.   Her blood sugars have not been well controlled. Her Hgb A1c in January was 9.5. She is currently taking Metformin. She has a good appetite and is staying well hydrated. Her weight is stable.   Medications:    Medication List       This list is accurate as of: 07/01/15 12:49 PM.  Always use your most recent med list.               ACCU-CHEK AVIVA PLUS w/Device Kit  USE AS DIRECTED TO CHECK BLOOD SUGARS     accu-chek multiclix lancets  USE TO CHECK BLOOD SUGAR DAILY AND PRN     acetaminophen 500 MG tablet  Commonly known as:  TYLENOL  Take 500 mg by mouth every 6 (six) hours as needed for  moderate pain or headache.     aspirin EC 81 MG tablet  Take 81 mg by mouth daily. Reported on 05/06/2015     busPIRone 15 MG tablet  Commonly known as:  BUSPAR  Take 15 mg by mouth daily.     gabapentin 100 MG capsule  Commonly known as:  NEURONTIN  Take 1 capsule (100 mg total) by mouth 3 (three) times daily.     glucose blood test strip  Commonly known as:  ACCU-CHEK AVIVA  USE TO CHECK BLOOD SUGAR DAILY AND PRN     levothyroxine 150 MCG tablet  Commonly known as:  SYNTHROID, LEVOTHROID  Take 150 mcg by mouth daily before breakfast.     lisinopril 20 MG tablet  Commonly known as:  PRINIVIL,ZESTRIL  Take 1 tablet (20 mg total) by mouth daily.     metFORMIN 1000 MG tablet  Commonly known as:  GLUCOPHAGE  TAKE 1 TABLET BY MOUTH DAILY WITH BREAKFAST     phentermine 37.5 MG capsule  Take 1 capsule (37.5 mg total) by mouth every morning.     sodium chloride 2 % ophthalmic solution  Commonly known as:  MURO 128  Place 1-2 drops into both eyes daily as needed (dry eye).        Allergies:  Allergies  Allergen Reactions  .  Niacin And Related Hives and Swelling    No anaphalaxis, but strong reactions.  . Lactose Intolerance (Gi)     "bad taste in mouth"  . Amoxicillin Swelling    hives  . Penicillins Swelling    Hives With all cillins    Past Medical History, Surgical history, Social history, and Family History were reviewed and updated.  Review of Systems: All other 10 point review of systems is negative.   Physical Exam:  height is 5' 3"  (1.6 m) and weight is 200 lb (90.719 kg). Her oral temperature is 98 F (36.7 C). Her blood pressure is 149/68 and her pulse is 93. Her respiration is 14.   Wt Readings from Last 3 Encounters:  07/01/15 200 lb (90.719 kg)  05/29/15 198 lb 8 oz (90.039 kg)  05/06/15 200 lb (90.719 kg)    Ocular: Sclerae unicteric, pupils equal, round and reactive to light Ear-nose-throat: Oropharynx clear, dentition fair Lymphatic: No  cervical supraclavicular or axillary adenopathy Lungs no rales or rhonchi, good excursion bilaterally Heart regular rate and rhythm, no murmur appreciated Abd soft, nontender, positive bowel sounds, no liver or spleen tip palpated on exam, no fluid wave  MSK no focal spinal tenderness, no joint edema Neuro: non-focal, well-oriented, appropriate affect Breasts: Deferred   Lab Results  Component Value Date   WBC 5.4 07/01/2015   HGB 12.6 07/01/2015   HCT 38.5 07/01/2015   MCV 86 07/01/2015   PLT 341 07/01/2015   Lab Results  Component Value Date   FERRITIN 81 08/05/2014   IRON 54 08/05/2014   TIBC 334 08/05/2014   UIBC 280 08/05/2014   IRONPCTSAT 16* 08/05/2014   Lab Results  Component Value Date   RETICCTPCT 0.9 07/15/2014   RBC 4.47 07/01/2015   RETICCTABS 46.9 07/15/2014   No results found for: KPAFRELGTCHN, LAMBDASER, KAPLAMBRATIO No results found for: IGGSERUM, IGA, IGMSERUM No results found for: Odetta Pink, SPEI   Chemistry      Component Value Date/Time   NA 138 09/25/2014 0507   K 3.7 09/25/2014 0507   CL 102 09/25/2014 0507   CO2 26 09/25/2014 0507   BUN 10 09/25/2014 0507   CREATININE 0.75 09/25/2014 0507      Component Value Date/Time   CALCIUM 8.6* 09/25/2014 0507   ALKPHOS 86 09/17/2014 1535   AST 19 09/17/2014 1535   ALT 21 09/17/2014 1535   BILITOT 0.4 09/17/2014 1535     Impression and Plan: Leslie Benton is 75 yo white female with a history of iron deficiency anemia. She is symptomatic at this time with fatigue and weakness. Her iron saturation at this time is 11% with a ferritin of 12. We will give her a dose of Feraheme today while she is here.  We will continue to follow-up with her as needed as before.  She will contact us with any hematologic questions or concerns. We can certainly see her when she needs [place].    Eliezer Bottom, NP 3/14/201712:49 PM

## 2015-07-02 ENCOUNTER — Encounter: Payer: Self-pay | Admitting: Hematology & Oncology

## 2015-07-02 LAB — RETICULOCYTES: Reticulocyte Count: 1.4 % (ref 0.6–2.6)

## 2015-07-02 LAB — ERYTHROPOIETIN: Erythropoietin: 25.8 m[IU]/mL — ABNORMAL HIGH (ref 2.6–18.5)

## 2015-07-07 ENCOUNTER — Other Ambulatory Visit: Payer: Self-pay | Admitting: *Deleted

## 2015-07-14 ENCOUNTER — Other Ambulatory Visit: Payer: Self-pay | Admitting: Internal Medicine

## 2015-07-28 DIAGNOSIS — G44221 Chronic tension-type headache, intractable: Secondary | ICD-10-CM | POA: Diagnosis not present

## 2015-07-28 DIAGNOSIS — M9901 Segmental and somatic dysfunction of cervical region: Secondary | ICD-10-CM | POA: Diagnosis not present

## 2015-07-28 DIAGNOSIS — M50321 Other cervical disc degeneration at C4-C5 level: Secondary | ICD-10-CM | POA: Diagnosis not present

## 2015-07-28 DIAGNOSIS — M9902 Segmental and somatic dysfunction of thoracic region: Secondary | ICD-10-CM | POA: Diagnosis not present

## 2015-07-28 DIAGNOSIS — M9905 Segmental and somatic dysfunction of pelvic region: Secondary | ICD-10-CM | POA: Diagnosis not present

## 2015-07-28 DIAGNOSIS — M9903 Segmental and somatic dysfunction of lumbar region: Secondary | ICD-10-CM | POA: Diagnosis not present

## 2015-07-28 DIAGNOSIS — Q72811 Congenital shortening of right lower limb: Secondary | ICD-10-CM | POA: Diagnosis not present

## 2015-07-28 DIAGNOSIS — M9904 Segmental and somatic dysfunction of sacral region: Secondary | ICD-10-CM | POA: Diagnosis not present

## 2015-07-28 DIAGNOSIS — M4126 Other idiopathic scoliosis, lumbar region: Secondary | ICD-10-CM | POA: Diagnosis not present

## 2015-07-28 DIAGNOSIS — M50322 Other cervical disc degeneration at C5-C6 level: Secondary | ICD-10-CM | POA: Diagnosis not present

## 2015-07-28 DIAGNOSIS — M50323 Other cervical disc degeneration at C6-C7 level: Secondary | ICD-10-CM | POA: Diagnosis not present

## 2015-07-28 DIAGNOSIS — M5136 Other intervertebral disc degeneration, lumbar region: Secondary | ICD-10-CM | POA: Diagnosis not present

## 2015-08-05 ENCOUNTER — Ambulatory Visit (INDEPENDENT_AMBULATORY_CARE_PROVIDER_SITE_OTHER): Payer: Medicare Other | Admitting: Internal Medicine

## 2015-08-05 ENCOUNTER — Encounter: Payer: Self-pay | Admitting: Internal Medicine

## 2015-08-05 VITALS — BP 150/70 | HR 96 | Temp 98.0°F | Resp 20 | Ht 63.0 in | Wt 201.0 lb

## 2015-08-05 DIAGNOSIS — E119 Type 2 diabetes mellitus without complications: Secondary | ICD-10-CM | POA: Diagnosis not present

## 2015-08-05 DIAGNOSIS — I10 Essential (primary) hypertension: Secondary | ICD-10-CM

## 2015-08-05 DIAGNOSIS — E785 Hyperlipidemia, unspecified: Secondary | ICD-10-CM | POA: Diagnosis not present

## 2015-08-05 DIAGNOSIS — M150 Primary generalized (osteo)arthritis: Secondary | ICD-10-CM | POA: Diagnosis not present

## 2015-08-05 DIAGNOSIS — M159 Polyosteoarthritis, unspecified: Secondary | ICD-10-CM

## 2015-08-05 DIAGNOSIS — E039 Hypothyroidism, unspecified: Secondary | ICD-10-CM | POA: Diagnosis not present

## 2015-08-05 LAB — LIPID PANEL
CHOL/HDL RATIO: 5
CHOLESTEROL: 282 mg/dL — AB (ref 0–200)
HDL: 54.1 mg/dL (ref >39.00)
NONHDL: 227.4
Triglycerides: 307 mg/dL — ABNORMAL HIGH (ref 0.0–149.0)
VLDL: 61.4 mg/dL — ABNORMAL HIGH (ref 0.0–40.0)

## 2015-08-05 LAB — MICROALBUMIN / CREATININE URINE RATIO
CREATININE, U: 216.9 mg/dL
MICROALB UR: 1.9 mg/dL (ref 0.0–1.9)
MICROALB/CREAT RATIO: 0.9 mg/g (ref 0.0–30.0)

## 2015-08-05 LAB — HEMOGLOBIN A1C: Hgb A1c MFr Bld: 9 % — ABNORMAL HIGH (ref 4.6–6.5)

## 2015-08-05 LAB — TSH: TSH: 5.45 u[IU]/mL — ABNORMAL HIGH (ref 0.35–4.50)

## 2015-08-05 LAB — LDL CHOLESTEROL, DIRECT: Direct LDL: 187 mg/dL

## 2015-08-05 MED ORDER — SIMVASTATIN 20 MG PO TABS: 20.0000 mg | ORAL_TABLET | Freq: Every day | ORAL | Status: DC

## 2015-08-05 MED ORDER — METFORMIN HCL 1000 MG PO TABS: 1000.0000 mg | ORAL_TABLET | Freq: Two times a day (BID) | ORAL | Status: DC

## 2015-08-05 MED ORDER — GLIPIZIDE ER 5 MG PO TB24: 5.0000 mg | ORAL_TABLET | Freq: Every day | ORAL | Status: DC

## 2015-08-05 NOTE — Progress Notes (Signed)
Subjective:    Patient ID: Leslie Benton, female    DOB: [birth year], 75 y.o.   MRN: [id number]  HPI  Lab Results  Component Value Date   HGBA1C 9.5* 05/06/2015    Wt Readings from Last 3 Encounters:  08/05/15 201 lb (91.173 kg)  07/01/15 200 lb (90.719 kg)  05/29/15 198 lb 8 oz (90.54 kg)   75 year old patient who is seen today for follow-up of diabetes.  There has been some issues with compliance.  Therapy with Actos again discussed that she is quite reluctant and hasn't taken this medication.  She is on metformin but at a submaximal dose of 1000 mg daily.  She does not recall any intolerance to the full dosing.  She has been on SU therapy in the past as well as statin therapy, but none presently  She continues to have significant neck pain and is scheduled to see Dr. Ellene Route this week  She has essential hypertension and history of iron deficiency anemia.  Past Medical History  Diagnosis Date  . COLONIC POLYPS, HX OF 02/07/2008  . DIABETES MELLITUS, TYPE II 10/27/2009  . GERD 10/14/2006  . HYPERLIPIDEMIA 10/14/2006  . HYPOTHYROIDISM 10/14/2006  . LOW BACK PAIN 08/04/2007  . Obesity   . DEGENERATIVE JOINT DISEASE 10/14/2006    03/31/11: Pt states arthritis in her hip.  . Anxiety   . Depression   . HYPERTENSION 10/14/2006    off bp meds now  . Anemia   . Cervical spondylosis without myelopathy 05/29/2015     Social History   Social History  . Marital Status: Married    Spouse Name: N/A  . Number of Children: 2  . Years of Education: college   Occupational History  . retired    Social History Main Topics  . Smoking status: Never Smoker   . Smokeless tobacco: Never Used     Comment: Never Used Tobacco  . Alcohol Use: No  . Drug Use: No  . Sexual Activity: Not on file   Other Topics Concern  . Not on file   Social History Narrative   Lives with husband and son.     Patient drinks 1-2 cups of caffeine daily.   Patient is left handed.     Past Surgical History   Procedure Laterality Date  . Hemorrhoid surgery    . Tonsillectomy    . Left knee arthroscopic  April 2014    Dr. Durward Fortes  . Tubal ligation    . Partial knee arthroplasty Left 09/23/2014    Procedure: LEFT UNICOMPARTMENTAL KNEE;  Surgeon: [name], MD;  Location: [facility];  Service: Orthopedics;  Laterality: Left;    Family History  Problem Relation Age of Onset  . Heart attack Father 84  . Kidney failure Mother   . Cancer Sister     endometrial  . Cancer Brother     Adrenal    Allergies  Allergen Reactions  . Niacin And Related Hives and Swelling    No anaphalaxis, but strong reactions.  . Lactose Intolerance (Gi)     "bad taste in mouth"  . Amoxicillin Swelling    hives  . Penicillins Swelling    Hives With all cillins    Current Outpatient Prescriptions on File Prior to Visit  Medication Sig Dispense Refill  . acetaminophen (TYLENOL) 500 MG tablet Take 500 mg by mouth every 6 (six) hours as needed for moderate pain or headache.    Marland Kitchen aspirin EC 81 MG tablet  Take 81 mg by mouth daily. Reported on 05/06/2015    . Blood Glucose Monitoring Suppl (ACCU-CHEK AVIVA PLUS) W/DEVICE KIT USE AS DIRECTED TO CHECK BLOOD SUGARS 1 kit 0  . busPIRone (BUSPAR) 15 MG tablet Take 15 mg by mouth daily.     Marland Kitchen glucose blood (ACCU-CHEK AVIVA) test strip USE TO CHECK BLOOD SUGAR DAILY AND PRN 100 each 12  . Lancets (ACCU-CHEK MULTICLIX) lancets USE TO CHECK BLOOD SUGAR DAILY AND PRN 100 each 4  . levothyroxine (SYNTHROID, LEVOTHROID) 150 MCG tablet Take 150 mcg by mouth daily before breakfast.   0  . lisinopril (PRINIVIL,ZESTRIL) 20 MG tablet Take 1 tablet (20 mg total) by mouth daily. 90 tablet 3  . metFORMIN (GLUCOPHAGE) 1000 MG tablet take 1 tablet by mouth daily with BREAKFAST. 90 tablet 1  . phentermine 37.5 MG capsule Take 1 capsule (37.5 mg total) by mouth every morning. 90 capsule 1  . sodium chloride (MURO 128) 2 % ophthalmic solution Place 1-2 drops into both eyes daily as needed  (dry eye).    Marland Kitchen gabapentin (NEURONTIN) 100 MG capsule Take 1 capsule (100 mg total) by mouth 3 (three) times daily. (Patient not taking: Reported on 08/05/2015) 90 capsule 1   No current facility-administered medications on file prior to visit.    BP 150/70 mmHg  Pulse 96  Temp(Src) 98 F (36.7 C) (Oral)  Resp 20  Ht _0  (1.6 m)  Wt 201 lb (91.173 kg)  BMI 35.61 kg/m2  SpO2 97%       Review of Systems  Constitutional: Negative.   HENT: Negative for congestion, dental problem, hearing loss, rhinorrhea, sinus pressure, sore throat and tinnitus.   Eyes: Negative for pain, discharge and visual disturbance.  Respiratory: Negative for cough and shortness of breath.   Cardiovascular: Negative for chest pain, palpitations and leg swelling.  Gastrointestinal: Negative for nausea, vomiting, abdominal pain, diarrhea, constipation, blood in stool and abdominal distention.  Genitourinary: Negative for dysuria, urgency, frequency, hematuria, flank pain, vaginal bleeding, vaginal discharge, difficulty urinating, vaginal pain and pelvic pain.  Musculoskeletal: Positive for neck pain and neck stiffness. Negative for joint swelling, arthralgias and gait problem.  Skin: Negative for rash.  Neurological: Negative for dizziness, syncope, speech difficulty, weakness, numbness and headaches.  Hematological: Negative for adenopathy.  Psychiatric/Behavioral: Negative for behavioral problems, dysphoric mood and agitation. The patient is not nervous/anxious.        Objective:   Physical Exam  Constitutional: She is oriented to person, place, and time. She appears well-developed and well-nourished.  Obese Repeat blood pressure 130/70  HENT:  Head: Normocephalic.  Right Ear: External ear normal.  Left Ear: External ear normal.  Mouth/Throat: Oropharynx is clear and moist.  Eyes: Conjunctivae and EOM are normal. Pupils are equal, round, and reactive to light.  Neck: Normal range of motion. Neck  supple. No thyromegaly present.  Cardiovascular: Normal rate, regular rhythm, normal heart sounds and intact distal pulses.   Pulmonary/Chest: Effort normal and breath sounds normal.  Abdominal: Soft. Bowel sounds are normal. She exhibits no mass. There is no tenderness.  Musculoskeletal: Normal range of motion.  Lymphadenopathy:    She has no cervical adenopathy.  Neurological: She is alert and oriented to person, place, and time.  Skin: Skin is warm and dry. No rash noted.  Psychiatric: She has a normal mood and affect. Her behavior is normal.          Assessment & Plan:   Diabetes mellitus.  Will  check hemoglobin A1c.  Noncompliance.  The patient remains only on metformin 1000 mg daily.  Will increase to 1000 mg twice a day and start on glipizide.  Nonpharmacologic measures discussed at length.  We'll resume statin therapy Neck pain.  Follow-up neurosurgery Hypertension, stable Obesity.  Weight loss exercise encouraged Dyslipidemia.  Resume statin therapy  Recheck 3 months

## 2015-08-05 NOTE — Progress Notes (Signed)
Pre visit review using our clinic review tool, if applicable. No additional management support is needed unless otherwise documented below in the visit note. 

## 2015-08-05 NOTE — Patient Instructions (Signed)
Limit your sodium (Salt) intake    It is important that you exercise regularly, at least 20 minutes 3 to 4 times per week.  If you develop chest pain or shortness of breath seek  medical attention.  You need to lose weight.  Consider a lower calorie diet and regular exercise.   Please check your hemoglobin A1c every 3 months  Increase metformin to 1000 mg twice daily  Start glipizide 5 mg daily  Resume simvastatin 20 mg daily   Please check your hemoglobin A1c every 3 months   Take an iron supplement daily  Schedule your colonoscopy to help detect colon cancer.

## 2015-08-06 DIAGNOSIS — M4722 Other spondylosis with radiculopathy, cervical region: Secondary | ICD-10-CM | POA: Diagnosis not present

## 2015-08-06 DIAGNOSIS — Z6835 Body mass index (BMI) 35.0-35.9, adult: Secondary | ICD-10-CM | POA: Diagnosis not present

## 2015-08-06 DIAGNOSIS — G5603 Carpal tunnel syndrome, bilateral upper limbs: Secondary | ICD-10-CM | POA: Diagnosis not present

## 2015-08-14 DIAGNOSIS — H2513 Age-related nuclear cataract, bilateral: Secondary | ICD-10-CM | POA: Diagnosis not present

## 2015-08-14 DIAGNOSIS — H40053 Ocular hypertension, bilateral: Secondary | ICD-10-CM | POA: Diagnosis not present

## 2015-09-04 DIAGNOSIS — Z471 Aftercare following joint replacement surgery: Secondary | ICD-10-CM | POA: Diagnosis not present

## 2015-09-04 DIAGNOSIS — Z96652 Presence of left artificial knee joint: Secondary | ICD-10-CM | POA: Diagnosis not present

## 2015-09-12 ENCOUNTER — Other Ambulatory Visit: Payer: Self-pay | Admitting: *Deleted

## 2015-09-12 ENCOUNTER — Other Ambulatory Visit: Payer: Self-pay

## 2015-09-12 MED ORDER — GLUCOSE BLOOD VI STRP: ORAL_STRIP | Status: DC

## 2015-09-12 MED ORDER — LEVOTHYROXINE SODIUM 150 MCG PO TABS: 150.0000 ug | ORAL_TABLET | Freq: Every day | ORAL | Status: DC

## 2015-09-12 MED ORDER — ACCU-CHEK MULTICLIX LANCETS MISC: Status: DC

## 2015-09-18 DIAGNOSIS — M50322 Other cervical disc degeneration at C5-C6 level: Secondary | ICD-10-CM | POA: Diagnosis not present

## 2015-09-18 DIAGNOSIS — M9904 Segmental and somatic dysfunction of sacral region: Secondary | ICD-10-CM | POA: Diagnosis not present

## 2015-09-18 DIAGNOSIS — M5136 Other intervertebral disc degeneration, lumbar region: Secondary | ICD-10-CM | POA: Diagnosis not present

## 2015-09-18 DIAGNOSIS — M4126 Other idiopathic scoliosis, lumbar region: Secondary | ICD-10-CM | POA: Diagnosis not present

## 2015-09-18 DIAGNOSIS — M9903 Segmental and somatic dysfunction of lumbar region: Secondary | ICD-10-CM | POA: Diagnosis not present

## 2015-09-18 DIAGNOSIS — M50323 Other cervical disc degeneration at C6-C7 level: Secondary | ICD-10-CM | POA: Diagnosis not present

## 2015-09-18 DIAGNOSIS — M9902 Segmental and somatic dysfunction of thoracic region: Secondary | ICD-10-CM | POA: Diagnosis not present

## 2015-09-18 DIAGNOSIS — G44221 Chronic tension-type headache, intractable: Secondary | ICD-10-CM | POA: Diagnosis not present

## 2015-09-18 DIAGNOSIS — M9901 Segmental and somatic dysfunction of cervical region: Secondary | ICD-10-CM | POA: Diagnosis not present

## 2015-09-18 DIAGNOSIS — Q72811 Congenital shortening of right lower limb: Secondary | ICD-10-CM | POA: Diagnosis not present

## 2015-09-18 DIAGNOSIS — M50321 Other cervical disc degeneration at C4-C5 level: Secondary | ICD-10-CM | POA: Diagnosis not present

## 2015-09-18 DIAGNOSIS — M9905 Segmental and somatic dysfunction of pelvic region: Secondary | ICD-10-CM | POA: Diagnosis not present

## 2015-09-19 ENCOUNTER — Telehealth: Payer: Self-pay | Admitting: Neurology

## 2015-09-19 NOTE — Telephone Encounter (Signed)
FYI- Called pt to r/s 6/15 appt.  Pt advised that she is doing a laser therapy and wants to see how it helps before she r/s with [place].

## 2015-09-22 DIAGNOSIS — M9904 Segmental and somatic dysfunction of sacral region: Secondary | ICD-10-CM | POA: Diagnosis not present

## 2015-09-22 DIAGNOSIS — M9902 Segmental and somatic dysfunction of thoracic region: Secondary | ICD-10-CM | POA: Diagnosis not present

## 2015-09-22 DIAGNOSIS — G44221 Chronic tension-type headache, intractable: Secondary | ICD-10-CM | POA: Diagnosis not present

## 2015-09-22 DIAGNOSIS — M50321 Other cervical disc degeneration at C4-C5 level: Secondary | ICD-10-CM | POA: Diagnosis not present

## 2015-09-22 DIAGNOSIS — M50322 Other cervical disc degeneration at C5-C6 level: Secondary | ICD-10-CM | POA: Diagnosis not present

## 2015-09-22 DIAGNOSIS — M50323 Other cervical disc degeneration at C6-C7 level: Secondary | ICD-10-CM | POA: Diagnosis not present

## 2015-09-22 DIAGNOSIS — M9901 Segmental and somatic dysfunction of cervical region: Secondary | ICD-10-CM | POA: Diagnosis not present

## 2015-09-22 DIAGNOSIS — M9905 Segmental and somatic dysfunction of pelvic region: Secondary | ICD-10-CM | POA: Diagnosis not present

## 2015-09-22 DIAGNOSIS — M9903 Segmental and somatic dysfunction of lumbar region: Secondary | ICD-10-CM | POA: Diagnosis not present

## 2015-09-22 DIAGNOSIS — Q72811 Congenital shortening of right lower limb: Secondary | ICD-10-CM | POA: Diagnosis not present

## 2015-09-22 DIAGNOSIS — M4126 Other idiopathic scoliosis, lumbar region: Secondary | ICD-10-CM | POA: Diagnosis not present

## 2015-09-22 DIAGNOSIS — M5136 Other intervertebral disc degeneration, lumbar region: Secondary | ICD-10-CM | POA: Diagnosis not present

## 2015-09-25 DIAGNOSIS — M9903 Segmental and somatic dysfunction of lumbar region: Secondary | ICD-10-CM | POA: Diagnosis not present

## 2015-09-25 DIAGNOSIS — M50322 Other cervical disc degeneration at C5-C6 level: Secondary | ICD-10-CM | POA: Diagnosis not present

## 2015-09-25 DIAGNOSIS — M9905 Segmental and somatic dysfunction of pelvic region: Secondary | ICD-10-CM | POA: Diagnosis not present

## 2015-09-25 DIAGNOSIS — M50321 Other cervical disc degeneration at C4-C5 level: Secondary | ICD-10-CM | POA: Diagnosis not present

## 2015-09-25 DIAGNOSIS — M5136 Other intervertebral disc degeneration, lumbar region: Secondary | ICD-10-CM | POA: Diagnosis not present

## 2015-09-25 DIAGNOSIS — G44221 Chronic tension-type headache, intractable: Secondary | ICD-10-CM | POA: Diagnosis not present

## 2015-09-25 DIAGNOSIS — Q72811 Congenital shortening of right lower limb: Secondary | ICD-10-CM | POA: Diagnosis not present

## 2015-09-25 DIAGNOSIS — M4126 Other idiopathic scoliosis, lumbar region: Secondary | ICD-10-CM | POA: Diagnosis not present

## 2015-09-25 DIAGNOSIS — M9902 Segmental and somatic dysfunction of thoracic region: Secondary | ICD-10-CM | POA: Diagnosis not present

## 2015-09-25 DIAGNOSIS — M9901 Segmental and somatic dysfunction of cervical region: Secondary | ICD-10-CM | POA: Diagnosis not present

## 2015-09-25 DIAGNOSIS — M50323 Other cervical disc degeneration at C6-C7 level: Secondary | ICD-10-CM | POA: Diagnosis not present

## 2015-09-25 DIAGNOSIS — M9904 Segmental and somatic dysfunction of sacral region: Secondary | ICD-10-CM | POA: Diagnosis not present

## 2015-09-29 DIAGNOSIS — M4126 Other idiopathic scoliosis, lumbar region: Secondary | ICD-10-CM | POA: Diagnosis not present

## 2015-09-29 DIAGNOSIS — M50322 Other cervical disc degeneration at C5-C6 level: Secondary | ICD-10-CM | POA: Diagnosis not present

## 2015-09-29 DIAGNOSIS — M9902 Segmental and somatic dysfunction of thoracic region: Secondary | ICD-10-CM | POA: Diagnosis not present

## 2015-09-29 DIAGNOSIS — Q72811 Congenital shortening of right lower limb: Secondary | ICD-10-CM | POA: Diagnosis not present

## 2015-09-29 DIAGNOSIS — M5136 Other intervertebral disc degeneration, lumbar region: Secondary | ICD-10-CM | POA: Diagnosis not present

## 2015-09-29 DIAGNOSIS — M9904 Segmental and somatic dysfunction of sacral region: Secondary | ICD-10-CM | POA: Diagnosis not present

## 2015-09-29 DIAGNOSIS — M9901 Segmental and somatic dysfunction of cervical region: Secondary | ICD-10-CM | POA: Diagnosis not present

## 2015-09-29 DIAGNOSIS — M9905 Segmental and somatic dysfunction of pelvic region: Secondary | ICD-10-CM | POA: Diagnosis not present

## 2015-09-29 DIAGNOSIS — M50321 Other cervical disc degeneration at C4-C5 level: Secondary | ICD-10-CM | POA: Diagnosis not present

## 2015-09-29 DIAGNOSIS — G44221 Chronic tension-type headache, intractable: Secondary | ICD-10-CM | POA: Diagnosis not present

## 2015-09-29 DIAGNOSIS — M50323 Other cervical disc degeneration at C6-C7 level: Secondary | ICD-10-CM | POA: Diagnosis not present

## 2015-09-29 DIAGNOSIS — M9903 Segmental and somatic dysfunction of lumbar region: Secondary | ICD-10-CM | POA: Diagnosis not present

## 2015-10-02 ENCOUNTER — Ambulatory Visit: Payer: Medicare Other | Admitting: Neurology

## 2015-10-06 ENCOUNTER — Ambulatory Visit: Payer: BLUE CROSS/BLUE SHIELD | Admitting: Hematology & Oncology

## 2015-10-06 ENCOUNTER — Ambulatory Visit: Payer: BLUE CROSS/BLUE SHIELD

## 2015-10-06 ENCOUNTER — Other Ambulatory Visit: Payer: BLUE CROSS/BLUE SHIELD

## 2015-11-04 ENCOUNTER — Ambulatory Visit: Payer: BLUE CROSS/BLUE SHIELD | Admitting: Internal Medicine

## 2015-11-06 ENCOUNTER — Telehealth: Payer: Self-pay | Admitting: General Practice

## 2015-11-07 ENCOUNTER — Telehealth: Payer: Self-pay | Admitting: Family Medicine

## 2015-11-07 NOTE — Telephone Encounter (Signed)
Opened in error

## 2015-11-07 NOTE — Telephone Encounter (Signed)
Pt requesting Phentermine RF Last OV 08/05/15 Last RX 05/06/15 x 1RF. Please advise.

## 2015-11-10 NOTE — Telephone Encounter (Signed)
Okay to refill? 

## 2015-11-13 NOTE — Telephone Encounter (Signed)
Can you do this

## 2015-11-14 MED ORDER — PHENTERMINE HCL 37.5 MG PO CAPS: 37.5000 mg | ORAL_CAPSULE | Freq: Every morning | ORAL | 1 refills | Status: DC

## 2015-11-18 IMAGING — CT CT HEAD W/O CM
2 series · 17 of 30 positions shown, 20 images · non-contrast
Comparison: None.

CLINICAL DATA: Altered mental status.

EXAM:
CT HEAD WITHOUT CONTRAST
TECHNIQUE: Contiguous axial images were obtained from the base of the skull
through the vertex without intravenous contrast.

[Series 2: head w/o · axial · non-contrast · 0.47mm/px · z∈[-261,-141]mm · 9 of 32 slices shown, 12 images]
[im 4/32  brain]
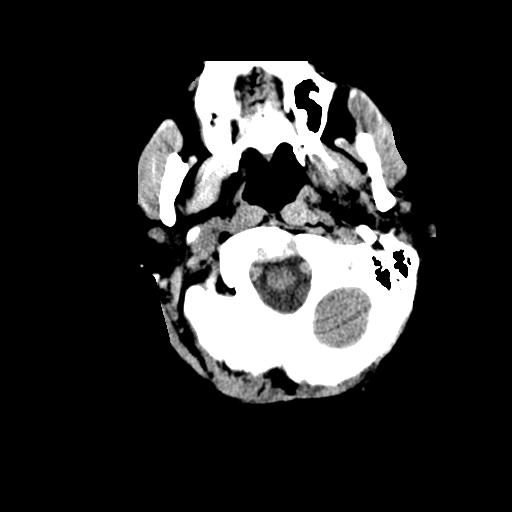
[im 4/32  bone]
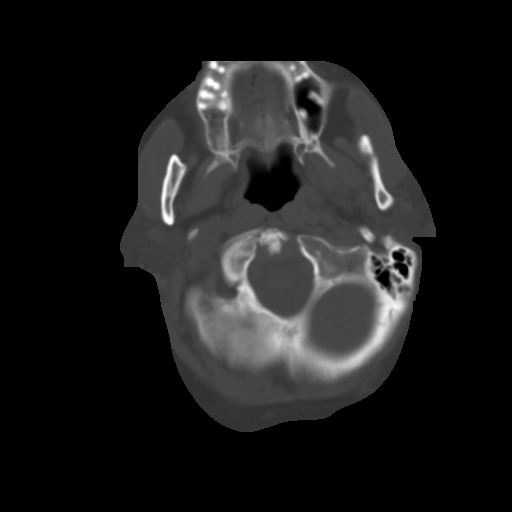
[im 7/32  brain]
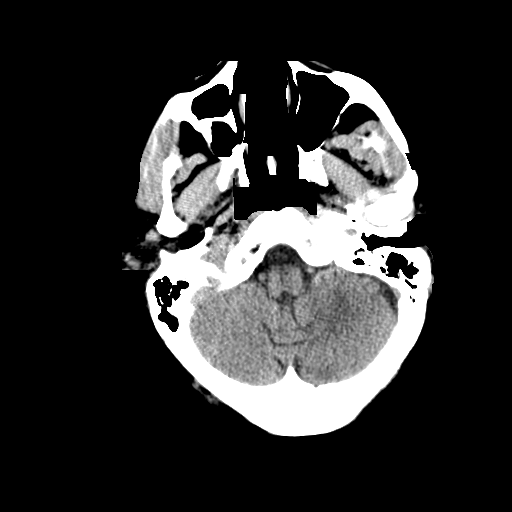
[im 10/32  brain]
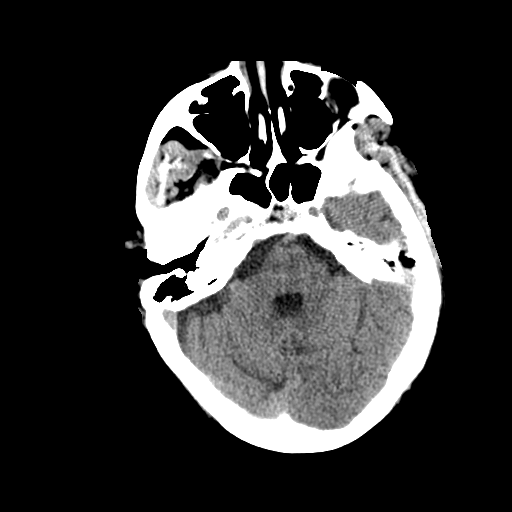
[im 13/32  brain]
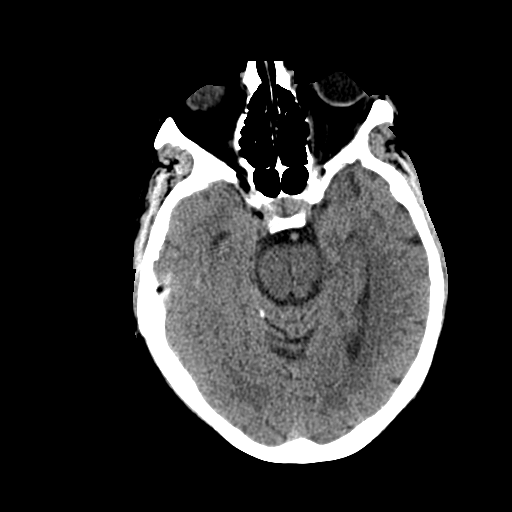
[im 16/32  brain]
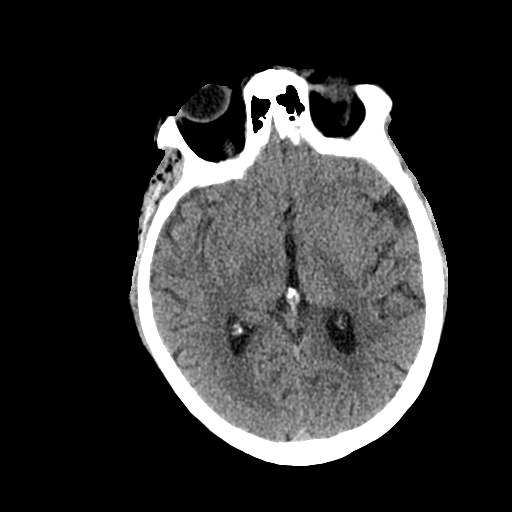
[im 16/32  bone]
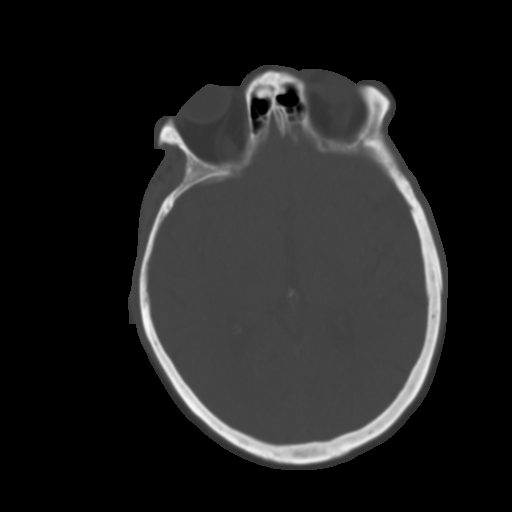
[im 19/32  brain]
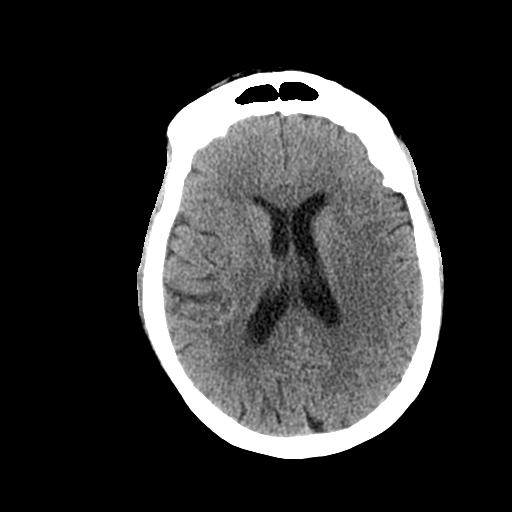
[im 22/32  brain]
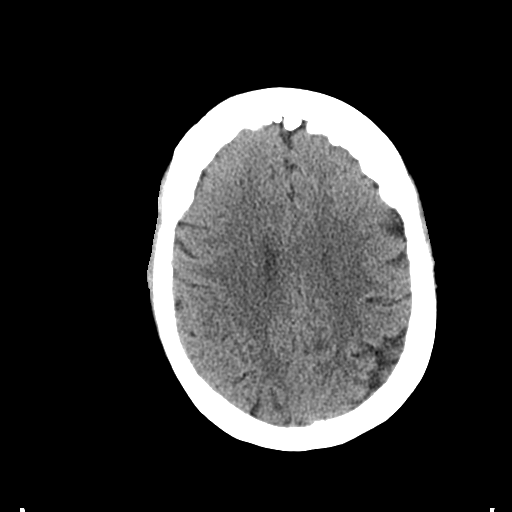
[im 25/32  brain]
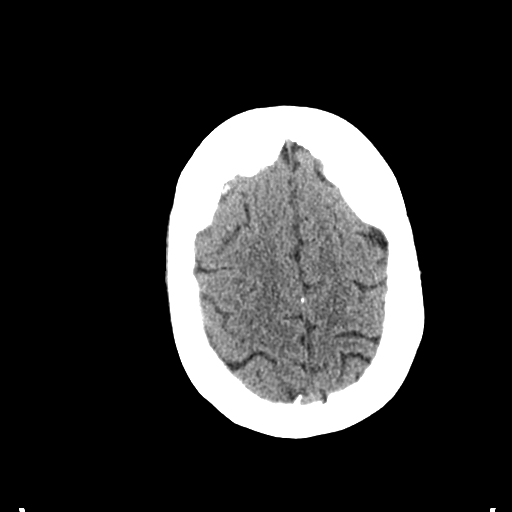
[im 28/32  brain]
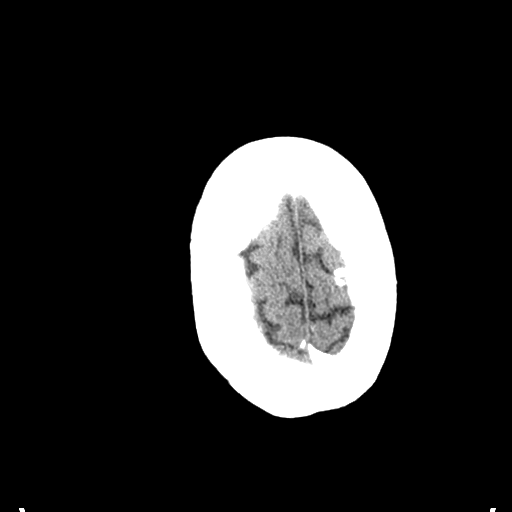
[im 28/32  bone]
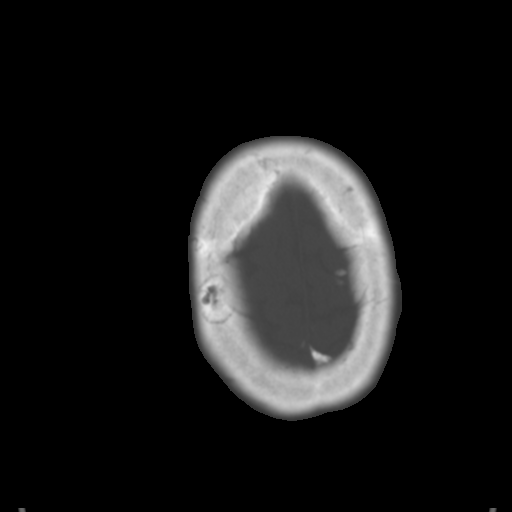

[Series 3: bone windows · axial · 0.47mm/px · z∈[-261,-141]mm · 8 of 52 slices shown]
[im 6/52  bone]
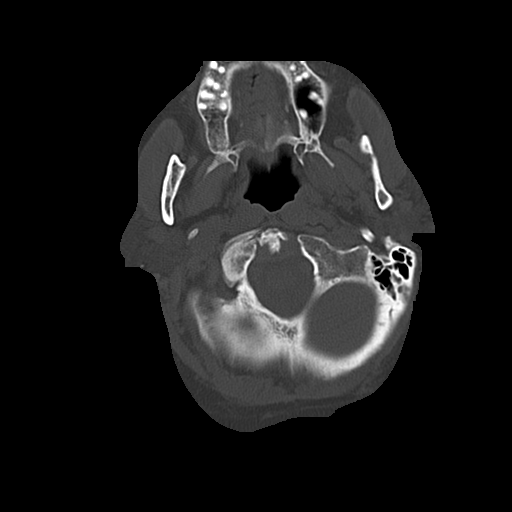
[im 12/52  bone]
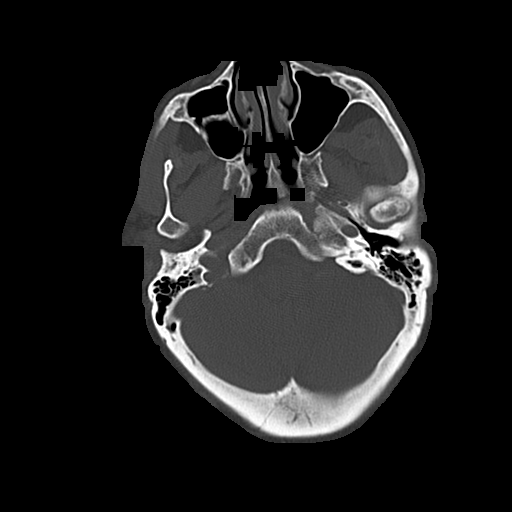
[im 18/52  bone]
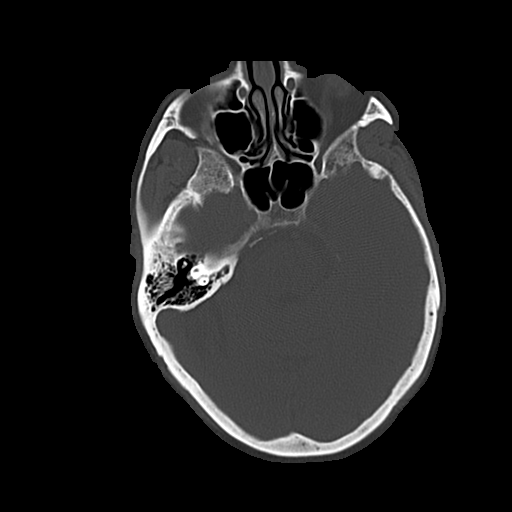
[im 23/52  bone]
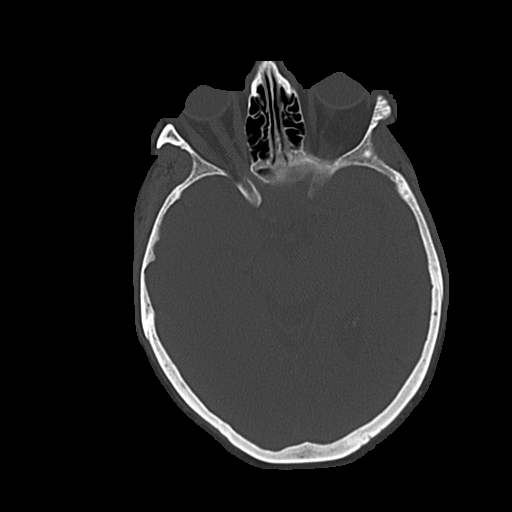
[im 29/52  bone]
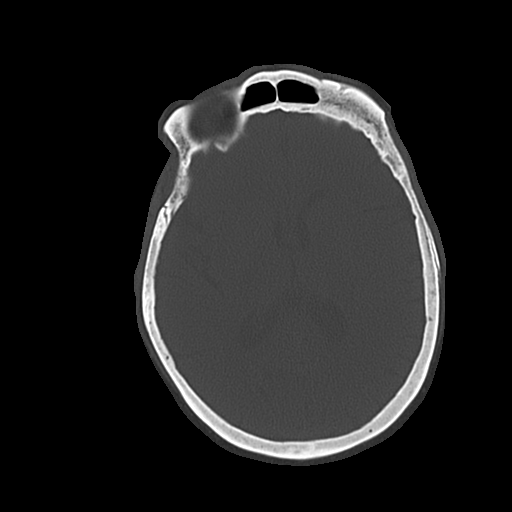
[im 35/52  bone]
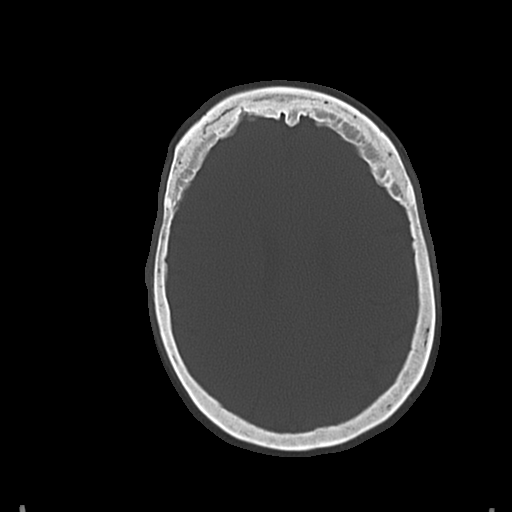
[im 40/52  bone]
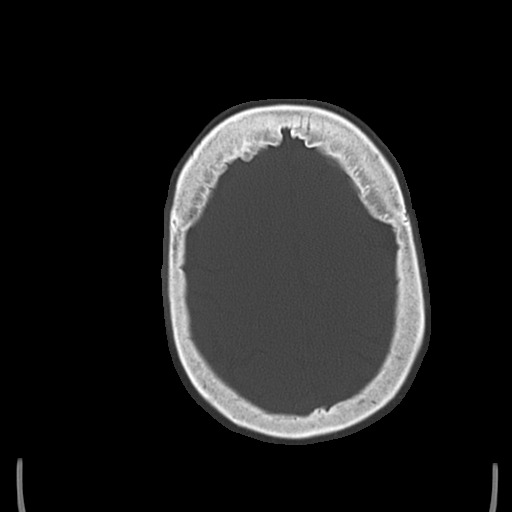
[im 46/52  bone]
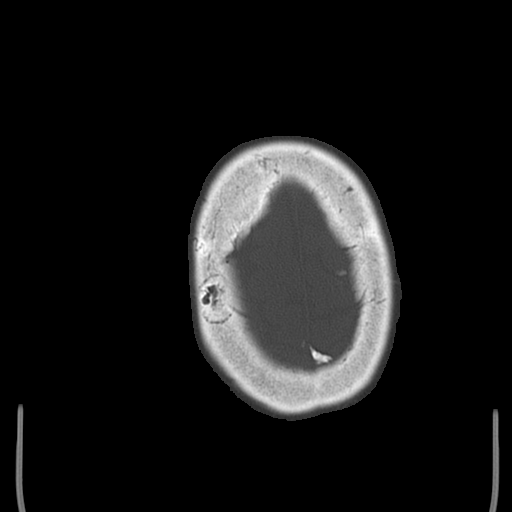

[17 of 30 positions shown; findings below may reference images not displayed]

FINDINGS: No acute intracranial abnormality. Specifically, no hemorrhage,
hydrocephalus, mass lesion, acute infarction, or significant
intracranial injury. No acute calvarial abnormality. Visualized
paranasal sinuses and mastoids clear. Orbital soft tissues
unremarkable.
IMPRESSION: No acute intracranial abnormality.

## 2015-11-19 IMAGING — CR DG CHEST 2V
2 series · 2 of 2 positions shown · non-contrast
Comparison: 09/28/2013

CLINICAL DATA: Shortness of breath and altered mental status.

EXAM:
CHEST  2 VIEW

[w chest lat]
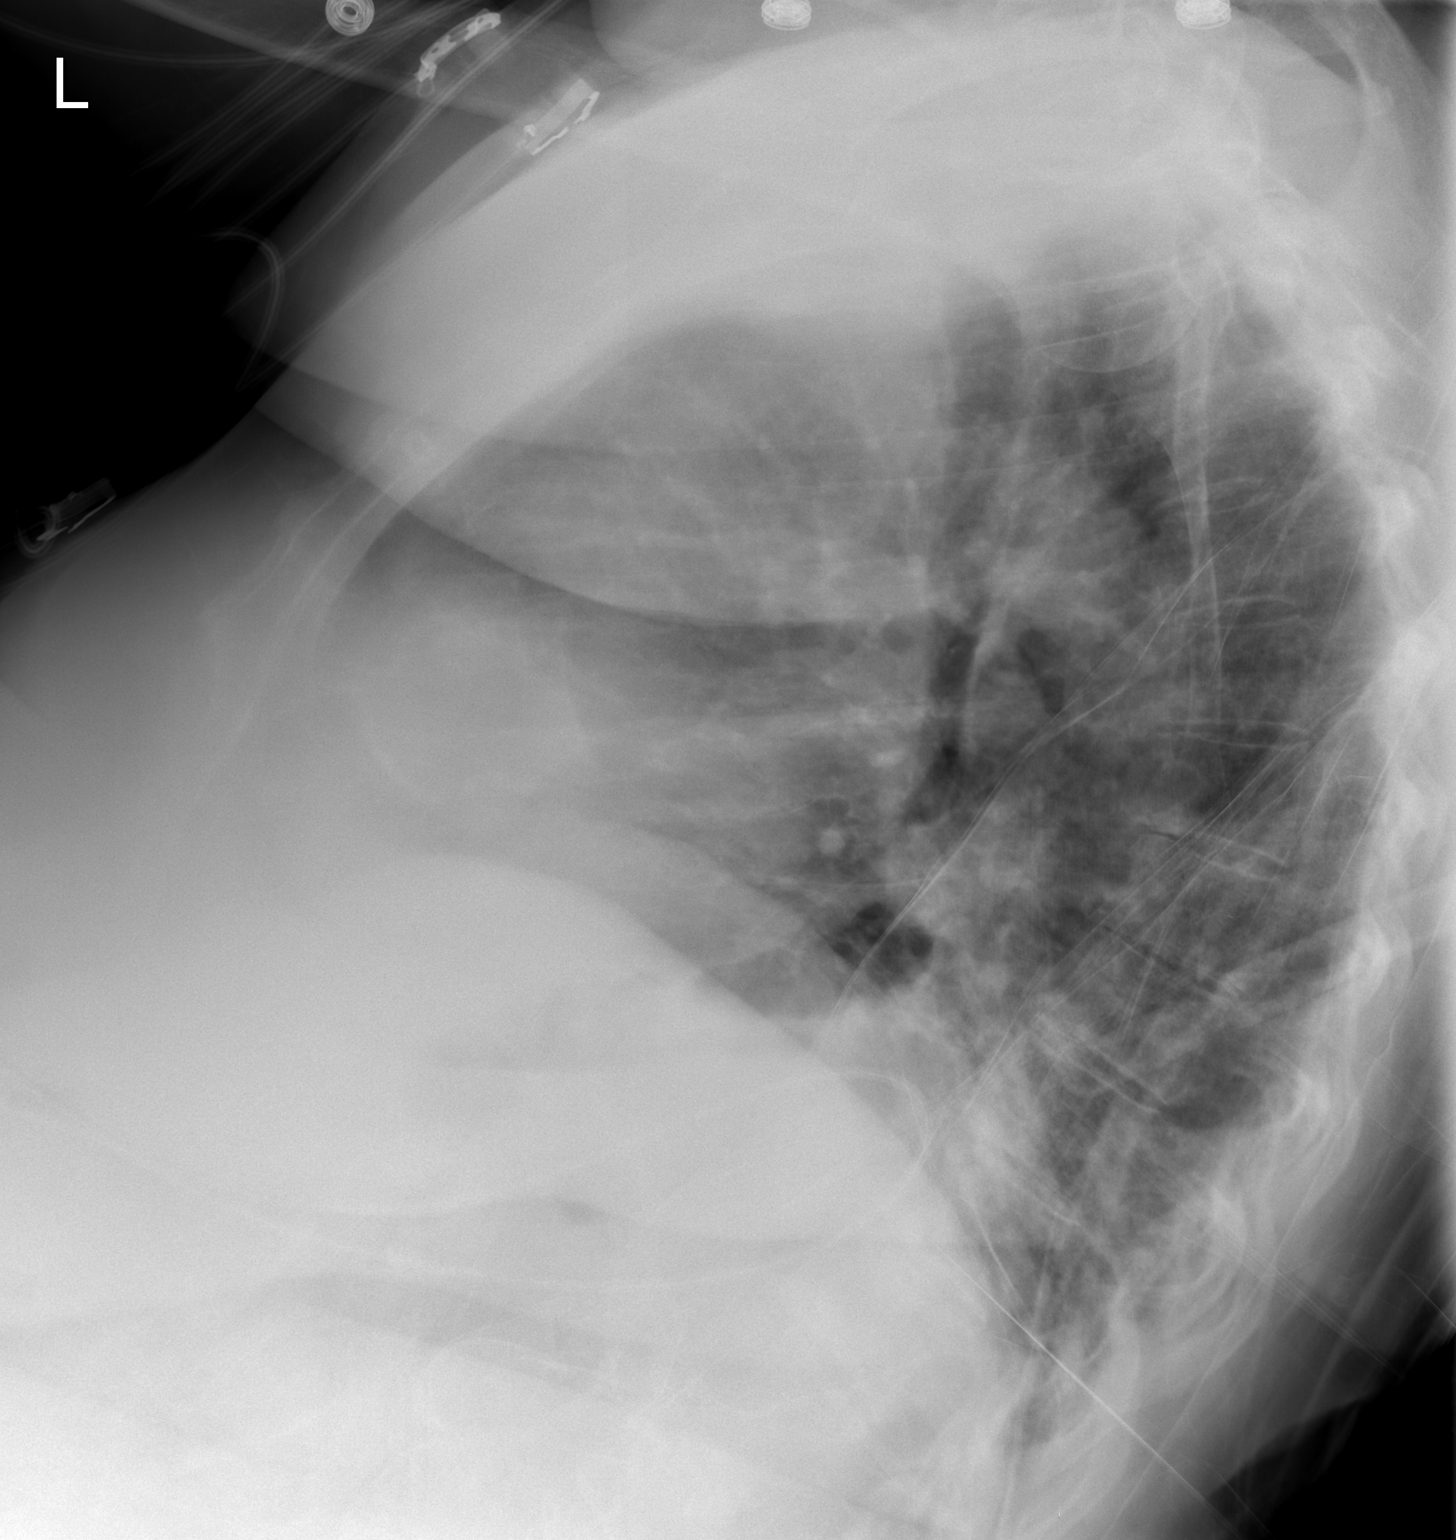

[view not recorded]
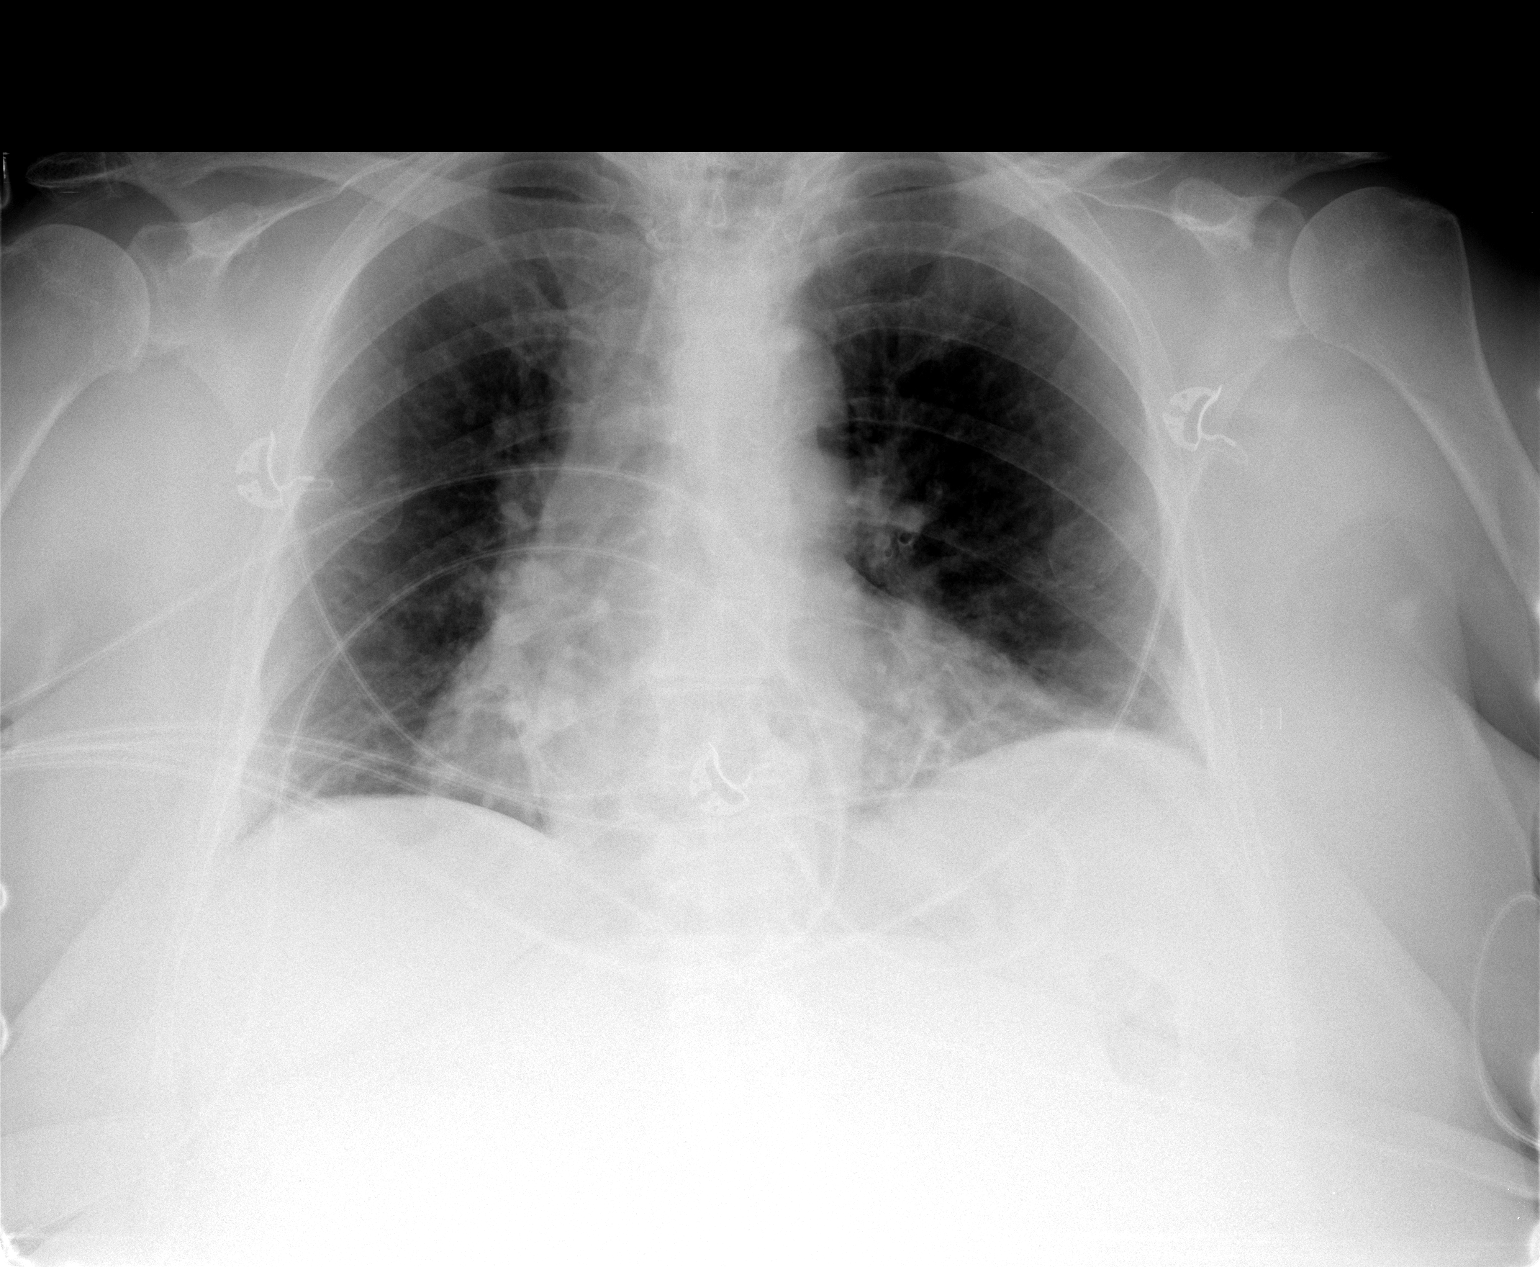

[2 of 2 positions shown; findings below may reference images not displayed]

FINDINGS: Two views of the chest again demonstrate mild enlargement of the
cardiac silhouette. There is improved aeration at the left lung
suggesting decreased atelectasis. No evidence for focal airspace
disease or pulmonary edema. No acute bone abnormality. Degenerative
changes in the lower thoracic spine. There continues to be slightly
low lung volumes.
IMPRESSION: Improved aeration at the left lung base.  No focal chest disease.

## 2016-01-06 ENCOUNTER — Telehealth: Payer: Self-pay | Admitting: Internal Medicine

## 2016-01-06 NOTE — Telephone Encounter (Signed)
°  FYI;  Pt call to say that Shanon Brow had a stroke and pt said she is dealing with dire.  She said he is doing well and is in the hospital at [facility].   49M Room 16

## 2016-01-23 ENCOUNTER — Telehealth: Payer: Self-pay | Admitting: Internal Medicine

## 2016-01-23 NOTE — Telephone Encounter (Signed)
Pt needs a letter fax to the club [phone] attn elizabeth. Pt needs a medical freeze on her membership  Due to her husband had a stroke

## 2016-02-01 ENCOUNTER — Encounter: Payer: Self-pay | Admitting: Internal Medicine

## 2016-02-15 ENCOUNTER — Other Ambulatory Visit: Payer: Self-pay | Admitting: Internal Medicine

## 2016-06-16 ENCOUNTER — Telehealth: Payer: Self-pay | Admitting: Internal Medicine

## 2016-06-16 NOTE — Telephone Encounter (Signed)
Pt request refill  phentermine 37.5 MG capsule    [facility]   [address], [place]    Pt would like a call back when this is done.  Ok to leave message.

## 2016-06-17 ENCOUNTER — Other Ambulatory Visit: Payer: Self-pay | Admitting: Internal Medicine

## 2016-06-17 MED ORDER — PHENTERMINE HCL 37.5 MG PO CAPS: 37.5000 mg | ORAL_CAPSULE | Freq: Every morning | ORAL | 1 refills | Status: DC

## 2016-06-17 NOTE — Telephone Encounter (Signed)
Rx printed waiting to be signed.  

## 2016-06-17 NOTE — Telephone Encounter (Signed)
New Rx refill faxed to [facility]

## 2016-06-23 ENCOUNTER — Telehealth: Payer: Self-pay | Admitting: Internal Medicine

## 2016-06-23 NOTE — Telephone Encounter (Signed)
Pt c/o of dizziness would like a Rx for meclizine. Please advise

## 2016-06-23 NOTE — Telephone Encounter (Signed)
Pt request a rx Meclazine. Pt states she takes Bonine almost every day, and her drugstore is not carrying it anymore. Advised pt she may need to be seen, but she states Dr Raliegh Ip is aware of her situation.  Pt has made an appt for 07/30/2016.  [facility] [address], [place]

## 2016-06-23 NOTE — Telephone Encounter (Signed)
Okay 25 mg #40 one every 8 hours as needed for dizziness

## 2016-06-24 MED ORDER — MECLIZINE HCL 25 MG PO TABS: 25.0000 mg | ORAL_TABLET | Freq: Three times a day (TID) | ORAL | 2 refills | Status: DC | PRN

## 2016-06-24 NOTE — Telephone Encounter (Signed)
Rx sent to pt's pharmacy

## 2016-06-24 NOTE — Addendum Note (Signed)
Addended by: Abelardo Diesel on: 06/24/2016 02:24 PM   Modules accepted: Orders

## 2016-07-07 ENCOUNTER — Telehealth: Payer: Self-pay | Admitting: Internal Medicine

## 2016-07-07 NOTE — Telephone Encounter (Signed)
Pt states she needs a Education officer, museum to come out and help her with decision making and help with managing her home situation. Pt not sure if she can wait until Dr Raliegh Ip returns because she feels at a stand still.

## 2016-07-21 NOTE — Telephone Encounter (Signed)
Patient and wife certainly have option to request a new CNA/ does not need a physician order for this

## 2016-07-21 NOTE — Telephone Encounter (Addendum)
Pt following up on this request.  Pt having anxiety issues.  Pt does not get along with husband's new CNA and this is causing severe anxiety.   Pt states she was assured they would not send anyone she did not like. She does not want Gwendolyn. Pt would like dr K to intervene on her behalf.  [facility]  [place]  cell phone: [phone]

## 2016-07-21 NOTE — Telephone Encounter (Signed)
Called pt no answer, message left on voicemail stating that she doesn't need a doctors order to request a new CNA

## 2016-07-21 NOTE — Telephone Encounter (Signed)
Please advise 

## 2016-07-21 NOTE — Telephone Encounter (Signed)
Pt states she realizes she does not need a dr's order, she wanted someone to call them and just assist with getting someone else to come in besides the one that does now on the weekend.

## 2016-07-22 DIAGNOSIS — Z96652 Presence of left artificial knee joint: Secondary | ICD-10-CM | POA: Diagnosis not present

## 2016-07-22 DIAGNOSIS — Z471 Aftercare following joint replacement surgery: Secondary | ICD-10-CM | POA: Diagnosis not present

## 2016-07-23 NOTE — Telephone Encounter (Signed)
Spoke with Leslie Benton with [facility], he was informed that pt is having anxiety d/t her spouse care giver, yet the client for comfort care is Mr Schirmer and he is very happy with his CNA. There was an issue with the weekend CNA that has been resolved.

## 2016-07-30 ENCOUNTER — Other Ambulatory Visit: Payer: Self-pay | Admitting: Internal Medicine

## 2016-07-30 ENCOUNTER — Encounter: Payer: Self-pay | Admitting: Internal Medicine

## 2016-07-30 ENCOUNTER — Telehealth: Payer: Self-pay | Admitting: Internal Medicine

## 2016-07-30 ENCOUNTER — Ambulatory Visit (INDEPENDENT_AMBULATORY_CARE_PROVIDER_SITE_OTHER): Payer: Medicare Other | Admitting: Internal Medicine

## 2016-07-30 VITALS — BP 182/76 | HR 107 | Temp 98.1°F | Ht 63.0 in | Wt 193.4 lb

## 2016-07-30 DIAGNOSIS — M150 Primary generalized (osteo)arthritis: Secondary | ICD-10-CM | POA: Diagnosis not present

## 2016-07-30 DIAGNOSIS — E039 Hypothyroidism, unspecified: Secondary | ICD-10-CM

## 2016-07-30 DIAGNOSIS — E119 Type 2 diabetes mellitus without complications: Secondary | ICD-10-CM

## 2016-07-30 DIAGNOSIS — M159 Polyosteoarthritis, unspecified: Secondary | ICD-10-CM

## 2016-07-30 DIAGNOSIS — I10 Essential (primary) hypertension: Secondary | ICD-10-CM

## 2016-07-30 DIAGNOSIS — Z86010 Personal history of colonic polyps: Secondary | ICD-10-CM

## 2016-07-30 LAB — COMPREHENSIVE METABOLIC PANEL
ALBUMIN: 4.4 g/dL (ref 3.5–5.2)
ALT: 15 U/L (ref 0–35)
AST: 13 U/L (ref 0–37)
Alkaline Phosphatase: 87 U/L (ref 39–117)
BUN: 20 mg/dL (ref 6–23)
CHLORIDE: 102 meq/L (ref 96–112)
CO2: 23 mEq/L (ref 19–32)
CREATININE: 0.95 mg/dL (ref 0.40–1.20)
Calcium: 9.7 mg/dL (ref 8.4–10.5)
GFR: 60.58 mL/min (ref >60.00)
GLUCOSE: 317 mg/dL — AB (ref 70–99)
Potassium: 4.5 mEq/L (ref 3.5–5.1)
SODIUM: 136 meq/L (ref 135–145)
TOTAL PROTEIN: 7.2 g/dL (ref 6.0–8.3)
Total Bilirubin: 0.3 mg/dL (ref 0.2–1.2)

## 2016-07-30 LAB — CBC WITH DIFFERENTIAL/PLATELET
BASOS ABS: 0 10*3/uL (ref 0.0–0.1)
Basophils Relative: 0.7 % (ref 0.0–3.0)
Eosinophils Absolute: 0.1 10*3/uL (ref 0.0–0.7)
Eosinophils Relative: 1.7 % (ref 0.0–5.0)
HCT: 32.9 % — ABNORMAL LOW (ref 36.0–46.0)
Hemoglobin: 9.4 g/dL — ABNORMAL LOW (ref 12.0–15.0)
LYMPHS ABS: 2.5 10*3/uL (ref 0.7–4.0)
Lymphocytes Relative: 40.2 % (ref 12.0–46.0)
MONOS PCT: 7 % (ref 3.0–12.0)
Monocytes Absolute: 0.4 10*3/uL (ref 0.1–1.0)
NEUTROS ABS: 3.1 10*3/uL (ref 1.4–7.7)
NEUTROS PCT: 50.4 % (ref 43.0–77.0)
Platelets: 594 10*3/uL — ABNORMAL HIGH (ref 150.0–400.0)
RBC: 5.14 Mil/uL — ABNORMAL HIGH (ref 3.87–5.11)
RDW: 23.5 % — ABNORMAL HIGH (ref 11.5–15.5)
WBC: 6.2 10*3/uL (ref 4.0–10.5)

## 2016-07-30 LAB — HEMOGLOBIN A1C: Hgb A1c MFr Bld: 10.5 % — ABNORMAL HIGH (ref 4.6–6.5)

## 2016-07-30 LAB — TSH: TSH: 6.81 u[IU]/mL — AB (ref 0.35–4.50)

## 2016-07-30 MED ORDER — METFORMIN HCL 1000 MG PO TABS: 1000.0000 mg | ORAL_TABLET | Freq: Two times a day (BID) | ORAL | 4 refills | Status: DC

## 2016-07-30 MED ORDER — GLIPIZIDE ER 5 MG PO TB24: 5.0000 mg | ORAL_TABLET | Freq: Every day | ORAL | 4 refills | Status: DC

## 2016-07-30 MED ORDER — CLOTRIMAZOLE 1 % VA CREA: 1.0000 | TOPICAL_CREAM | Freq: Every day | VAGINAL | 0 refills | Status: DC

## 2016-07-30 NOTE — Progress Notes (Signed)
Subjective:    Patient ID: Leslie Benton, female    DOB: 1940-08-16, 76 y.o.   MRN: [id number]  HPI  76 year old patient who has multiple medical issues including uncontrolled diabetes.  She has not been seen in approximately , one year. Complaints today include weakness, vaginal itching .  No home blood sugar monitoring She has a history of hypothyroidism  and anemia.  She has been under considerable stress due to the poor health of her husband who has had a stroke.  Past Medical History:  Diagnosis Date  . Anemia   . Anxiety   . Cervical spondylosis without myelopathy 05/29/2015  . COLONIC POLYPS, HX OF 02/07/2008  . DEGENERATIVE JOINT DISEASE 10/14/2006   03/31/11: Pt states arthritis in her hip.  . Depression   . DIABETES MELLITUS, TYPE II 10/27/2009  . GERD 10/14/2006  . HYPERLIPIDEMIA 10/14/2006  . HYPERTENSION 10/14/2006   off bp meds now  . HYPOTHYROIDISM 10/14/2006  . LOW BACK PAIN 08/04/2007  . Obesity      Social History   Social History  . Marital status: Married    Spouse name: N/A  . Number of children: 2  . Years of education: college   Occupational History  . retired    Social History Main Topics  . Smoking status: Never Smoker  . Smokeless tobacco: Never Used     Comment: Never Used Tobacco  . Alcohol use No  . Drug use: No  . Sexual activity: Not on file   Other Topics Concern  . Not on file   Social History Narrative   Lives with husband and son.     Patient drinks 1-2 cups of caffeine daily.   Patient is left handed.     Past Surgical History:  Procedure Laterality Date  . HEMORRHOID SURGERY    . Left knee arthroscopic  April 2014   Dr. Durward Fortes  . PARTIAL KNEE ARTHROPLASTY Left 09/23/2014   Procedure: LEFT UNICOMPARTMENTAL KNEE;  Surgeon: [name], MD;  Location: [facility];  Service: Orthopedics;  Laterality: Left;  . TONSILLECTOMY    . TUBAL LIGATION      Family History  Problem Relation Age of Onset  . Heart attack Father 53    . Kidney failure Mother   . Cancer Sister     endometrial  . Cancer Brother     Adrenal    Allergies  Allergen Reactions  . Niacin And Related Hives and Swelling    No anaphalaxis, but strong reactions.  . Lactose Intolerance (Gi)     "bad taste in mouth"  . Amoxicillin Swelling    hives  . Penicillins Swelling    Hives With all cillins    Current Outpatient Prescriptions on File Prior to Visit  Medication Sig Dispense Refill  . ibuprofen (ADVIL,MOTRIN) 200 MG tablet Take 400 mg by mouth as needed.    Marland Kitchen acetaminophen (TYLENOL) 500 MG tablet Take 500 mg by mouth every 6 (six) hours as needed for moderate pain or headache.    Marland Kitchen aspirin EC 81 MG tablet Take 81 mg by mouth daily. Reported on 05/06/2015    . Blood Glucose Monitoring Suppl (ACCU-CHEK AVIVA PLUS) W/DEVICE KIT USE AS DIRECTED TO CHECK BLOOD SUGARS 1 kit 0  . busPIRone (BUSPAR) 15 MG tablet Take 15 mg by mouth daily.     Marland Kitchen gabapentin (NEURONTIN) 100 MG capsule Take 1 capsule (100 mg total) by mouth 3 (three) times daily. (Patient not taking: Reported on  08/05/2015) 90 capsule 1  . glipiZIDE (GLUCOTROL XL) 5 MG 24 hr tablet Take 1 tablet (5 mg total) by mouth daily with breakfast. 90 tablet 4  . glucose blood (ACCU-CHEK AVIVA) test strip USE TO CHECK BLOOD SUGAR DAILY AND PRN 100 each 12  . Lancets (ACCU-CHEK MULTICLIX) lancets USE TO CHECK BLOOD SUGAR DAILY AND PRN 100 each 4  . levothyroxine (SYNTHROID, LEVOTHROID) 150 MCG tablet Take 1 tablet (150 mcg total) by mouth daily before breakfast. 90 tablet 3  . lisinopril (PRINIVIL,ZESTRIL) 20 MG tablet Take 1 tablet (20 mg total) by mouth daily. 90 tablet 3  . loratadine (CLARITIN) 10 MG tablet Take 10 mg by mouth daily.    . meclizine (ANTIVERT) 25 MG tablet Take 1 tablet (25 mg total) by mouth 3 (three) times daily as needed for dizziness. 40 tablet 2  . metFORMIN (GLUCOPHAGE) 1000 MG tablet Take 1 tablet (1,000 mg total) by mouth 2 (two) times daily with a meal. 180 tablet  4  . Multiple Vitamins-Minerals (MULTI-VITAMIN GUMMIES PO) Take 2 each by mouth daily.    . phentermine 37.5 MG capsule Take 1 capsule (37.5 mg total) by mouth every morning. Needs office refills for further refills 90 capsule 1  . propranolol (INDERAL) 20 MG tablet take 1 tablet by mouth twice a day 180 tablet 3  . simvastatin (ZOCOR) 20 MG tablet Take 1 tablet (20 mg total) by mouth at bedtime. 90 tablet 3  . sodium chloride (MURO 128) 2 % ophthalmic solution Place 1-2 drops into both eyes daily as needed (dry eye).     No current facility-administered medications on file prior to visit.     BP (!) 182/76 (BP Location: Left Arm, Patient Position: Sitting, Cuff Size: Normal)   Pulse (!) 107   Temp 98.1 F (36.7 C) (Oral)   Ht 5' 3"  (1.6 m)   Wt 193 lb 6.4 oz (87.7 kg)   SpO2 98%   BMI 34.26 kg/m    Review of Systems  Constitutional: Positive for fatigue.  Genitourinary: Positive for decreased urine volume.  Skin: Positive for rash.  Neurological: Positive for weakness.       Objective:   Physical Exam  Constitutional: She is oriented to person, place, and time. She appears well-developed and well-nourished.  HENT:  Head: Normocephalic.  Right Ear: External ear normal.  Left Ear: External ear normal.  Mouth/Throat: Oropharynx is clear and moist.  Eyes: Conjunctivae and EOM are normal. Pupils are equal, round, and reactive to light.  Neck: Normal range of motion. Neck supple. No thyromegaly present.  Cardiovascular: Normal rate, regular rhythm, normal heart sounds and intact distal pulses.   Pulmonary/Chest: Effort normal and breath sounds normal.  Abdominal: Soft. Bowel sounds are normal. She exhibits no mass. There is no tenderness.  Musculoskeletal: Normal range of motion.  Lymphadenopathy:    She has no cervical adenopathy.  Neurological: She is alert and oriented to person, place, and time.  Skin: Skin is warm and dry. No rash noted.  Right wrist ganglion   Psychiatric: She has a normal mood and affect. Her behavior is normal.          Assessment & Plan:   Diabetes mellitus.  Likely poorly controlled.  Will .  Check hemoglobin A1c Hypertension, stable History of anemia.  Will check C in view of her weakness Hypothyroidism.  Check TSH  Compliance with her medications.  Follow-up 3 months Home blood sugar monitoring.   Encouraged  [name]

## 2016-07-30 NOTE — Telephone Encounter (Signed)
Please see message below, please advise 

## 2016-07-30 NOTE — Progress Notes (Signed)
Pre visit review using our clinic review tool, if applicable. No additional management support is needed unless otherwise documented below in the visit note. 

## 2016-07-30 NOTE — Telephone Encounter (Signed)
Pt was called by Dr Raliegh Ip as well as myself to inform.

## 2016-07-30 NOTE — Telephone Encounter (Signed)
Notify patient that she needs follow with Dr. Cristina Gong and follow-up colonoscopy this spring or summer

## 2016-07-30 NOTE — Patient Instructions (Signed)
Limit your sodium (Salt) intake   Please check your hemoglobin A1c every 3 months    It is important that you exercise regularly, at least 20 minutes 3 to 4 times per week.  If you develop chest pain or shortness of breath seek  medical attention.  You need to lose weight.  Consider a lower calorie diet and regular exercise.  Please see your eye doctor yearly to check for diabetic eye damage  Schedule your colonoscopy to help detect colon cancer.  Return in 3 months for follow-up or sooner if needed  Please take all your medications  faithfully

## 2016-07-30 NOTE — Telephone Encounter (Signed)
Pt would like to know how important it is she sees a GI dr anytime soon. Pt states she is not up to it at this time, and hopes ok to put off for a while.  Not sure why she needs to go anyway. Would like to know.  Would like a call back this afternoon.  Also, pt had seen Dr Cristina Gong at [facility] in 2011. Prefers to see him as opposed to [facility].

## 2016-08-04 DIAGNOSIS — D500 Iron deficiency anemia secondary to blood loss (chronic): Secondary | ICD-10-CM | POA: Diagnosis not present

## 2016-08-04 DIAGNOSIS — K625 Hemorrhage of anus and rectum: Secondary | ICD-10-CM | POA: Diagnosis not present

## 2016-08-04 LAB — CBC AND DIFFERENTIAL
HCT: 29 % — AB (ref 36–46)
Hemoglobin: 8.5 g/dL — AB (ref 12.0–16.0)
Neutrophils Absolute: 3 /uL
Platelets: 555 10*3/uL — AB (ref 150–399)
WBC: 5.7 10^3/mL

## 2016-08-05 ENCOUNTER — Telehealth: Payer: Self-pay | Admitting: Internal Medicine

## 2016-08-05 NOTE — Telephone Encounter (Signed)
Pt is having awful time taking to use clotrimazole vaginally. Pt would like a pill instead. Pt is requesting med [facility]

## 2016-08-05 NOTE — Telephone Encounter (Signed)
Patient is not able to use the clotrimazole cream with applicators. She would like to know if there is something oral she can take.   Dr. Raliegh Ip - Please advise. Thanks!

## 2016-08-06 ENCOUNTER — Encounter: Payer: Self-pay | Admitting: Family Medicine

## 2016-08-06 NOTE — Telephone Encounter (Signed)
Please call in a new prescription for Diflucan 150 mg #2 1 tablet now and repeat in one week

## 2016-08-09 MED ORDER — FLUCONAZOLE 150 MG PO TABS: 150.0000 mg | ORAL_TABLET | Freq: Once | ORAL | 0 refills | Status: AC

## 2016-08-09 NOTE — Telephone Encounter (Signed)
Spoke with pt and advised on change in medication. She is still having itching so advised that she can apply the cream to the affected areas. Reviewed medication instructions with pt. She voiced understanding. RX sent to pharmacy.  She also is scheduled to have colonoscopy and endoscopy with Dr. Cristina Gong. He has requested for her to have labs done. She would like to have them done here as this is closer to her home and easier for her to drive to.  Spoke with Dr. Osborn [facility] office and lab orders were faxed here on 08/06/16. [name] - do you have these?

## 2016-08-10 ENCOUNTER — Encounter: Payer: Self-pay | Admitting: Family Medicine

## 2016-08-11 ENCOUNTER — Other Ambulatory Visit (INDEPENDENT_AMBULATORY_CARE_PROVIDER_SITE_OTHER): Payer: Medicare Other

## 2016-08-11 ENCOUNTER — Other Ambulatory Visit: Payer: Self-pay | Admitting: Internal Medicine

## 2016-08-11 DIAGNOSIS — D500 Iron deficiency anemia secondary to blood loss (chronic): Secondary | ICD-10-CM | POA: Diagnosis not present

## 2016-08-11 LAB — CBC
HCT: 29.9 % — ABNORMAL LOW (ref 36.0–46.0)
MCHC: 29.2 g/dL — ABNORMAL LOW (ref 30.0–36.0)
MCV: 64 fl — ABNORMAL LOW (ref 78.0–100.0)
PLATELETS: 489 10*3/uL — AB (ref 150.0–400.0)
RBC: 4.68 Mil/uL (ref 3.87–5.11)
RDW: 23.4 % — AB (ref 11.5–15.5)
WBC: 4.7 10*3/uL (ref 4.0–10.5)

## 2016-08-17 DIAGNOSIS — K224 Dyskinesia of esophagus: Secondary | ICD-10-CM | POA: Diagnosis not present

## 2016-08-17 DIAGNOSIS — R195 Other fecal abnormalities: Secondary | ICD-10-CM | POA: Diagnosis not present

## 2016-08-17 DIAGNOSIS — K449 Diaphragmatic hernia without obstruction or gangrene: Secondary | ICD-10-CM | POA: Diagnosis not present

## 2016-08-17 DIAGNOSIS — K5289 Other specified noninfective gastroenteritis and colitis: Secondary | ICD-10-CM | POA: Diagnosis not present

## 2016-08-17 DIAGNOSIS — D509 Iron deficiency anemia, unspecified: Secondary | ICD-10-CM | POA: Diagnosis not present

## 2016-08-17 LAB — HM COLONOSCOPY

## 2016-08-23 DIAGNOSIS — K5289 Other specified noninfective gastroenteritis and colitis: Secondary | ICD-10-CM | POA: Diagnosis not present

## 2016-08-31 DIAGNOSIS — K449 Diaphragmatic hernia without obstruction or gangrene: Secondary | ICD-10-CM | POA: Diagnosis not present

## 2016-08-31 DIAGNOSIS — D509 Iron deficiency anemia, unspecified: Secondary | ICD-10-CM | POA: Diagnosis not present

## 2016-08-31 DIAGNOSIS — K6289 Other specified diseases of anus and rectum: Secondary | ICD-10-CM | POA: Diagnosis not present

## 2016-09-01 ENCOUNTER — Encounter: Payer: Self-pay | Admitting: Family Medicine

## 2016-09-14 ENCOUNTER — Telehealth: Payer: Self-pay | Admitting: Internal Medicine

## 2016-09-14 NOTE — Telephone Encounter (Signed)
Pt need new Rx for yeast infection one tablet and she has not slept since she has had this.  [facility] [phone]

## 2016-09-14 NOTE — Telephone Encounter (Signed)
Please advise 

## 2016-09-14 NOTE — Telephone Encounter (Signed)
1 tablet should be curative.  Second dose is not necessary

## 2016-09-15 ENCOUNTER — Other Ambulatory Visit (HOSPITAL_COMMUNITY)
Admission: RE | Admit: 2016-09-15 | Discharge: 2016-09-15 | Disposition: A | Discharge status: Home / Self Care | Payer: Medicare Other | Source: Ambulatory Visit | Attending: Internal Medicine | Admitting: Internal Medicine

## 2016-09-15 ENCOUNTER — Ambulatory Visit (INDEPENDENT_AMBULATORY_CARE_PROVIDER_SITE_OTHER): Payer: Medicare Other | Admitting: Internal Medicine

## 2016-09-15 ENCOUNTER — Encounter: Payer: Self-pay | Admitting: Internal Medicine

## 2016-09-15 VITALS — BP 140/60 | HR 96 | Temp 97.7°F

## 2016-09-15 DIAGNOSIS — B373 Candidiasis of vulva and vagina: Secondary | ICD-10-CM | POA: Insufficient documentation

## 2016-09-15 DIAGNOSIS — L293 Anogenital pruritus, unspecified: Secondary | ICD-10-CM | POA: Diagnosis not present

## 2016-09-15 MED ORDER — TERCONAZOLE 0.4 % VA CREA: 1.0000 | TOPICAL_CREAM | Freq: Every day | VAGINAL | 0 refills | Status: DC

## 2016-09-15 MED ORDER — FLUCONAZOLE 150 MG PO TABS: 150.0000 mg | ORAL_TABLET | Freq: Once | ORAL | 0 refills | Status: DC

## 2016-09-15 NOTE — Telephone Encounter (Signed)
Pt has been scheduled today with Dr. Regis Bill

## 2016-09-15 NOTE — Telephone Encounter (Signed)
Noted thank you

## 2016-09-15 NOTE — Telephone Encounter (Addendum)
Pt following up on refill request. Pt states if it is the same rx as before , that med does not work. Pt does not want anything inserted vaginally.  Pt also wants Dr Raliegh Ip to know she has not slept since he increased her Metformin. Pt states maybe a mild sleep script? Does pt need to be seen?

## 2016-09-15 NOTE — Telephone Encounter (Signed)
Pt calling back stating the Rx called in is the one that does not work and you pls call something else in.

## 2016-09-15 NOTE — Telephone Encounter (Signed)
Pt calling to check the status of the medication the previous one did not work and would like to have something different to be called in to [facility].

## 2016-09-15 NOTE — Progress Notes (Signed)
Chief Complaint  Patient presents with  . Vaginal Itching    HPI: Leslie Benton 76 y.o.  sda PCP NA  Has DM   Was given diflucan x 2 in April after antibiotic     For same sx  And  No helpw with cream  And diflucan    However couldn't use the applicator   So not really got the topical  No dyruria but uses  Pull ups  No dc  No hx of same itches agood bit and sore  No new  detergents etc   Has dm not good control no  Other antibiotic ROS: See pertinent positives and negatives per HPI. No fever uti sx    Past Medical History:  Diagnosis Date  . Anemia   . Anxiety   . Cervical spondylosis without myelopathy 05/29/2015  . COLONIC POLYPS, HX OF 02/07/2008  . DEGENERATIVE JOINT DISEASE 10/14/2006   03/31/11: Pt states arthritis in her hip.  . Depression   . DIABETES MELLITUS, TYPE II 10/27/2009  . GERD 10/14/2006  . HYPERLIPIDEMIA 10/14/2006  . HYPERTENSION 10/14/2006   off bp meds now  . HYPOTHYROIDISM 10/14/2006  . LOW BACK PAIN 08/04/2007  . Obesity     Family History  Problem Relation Age of Onset  . Heart attack Father 26  . Kidney failure Mother   . Cancer Sister        endometrial  . Cancer Brother        Adrenal    Social History   Social History  . Marital status: Married    Spouse name: N/A  . Number of children: 2  . Years of education: college   Occupational History  . retired    Social History Main Topics  . Smoking status: Never Smoker  . Smokeless tobacco: Never Used     Comment: Never Used Tobacco  . Alcohol use No  . Drug use: No  . Sexual activity: Not Asked   Other Topics Concern  . None   Social History Narrative   Lives with husband and son.     Patient drinks 1-2 cups of caffeine daily.   Patient is left handed.     Outpatient Medications Prior to Visit  Medication Sig Dispense Refill  . acetaminophen (TYLENOL) 500 MG tablet Take 500 mg by mouth every 6 (six) hours as needed for moderate pain or headache.    . Blood Glucose  Monitoring Suppl (ACCU-CHEK AVIVA PLUS) W/DEVICE KIT USE AS DIRECTED TO CHECK BLOOD SUGARS 1 kit 0  . busPIRone (BUSPAR) 15 MG tablet Take 15 mg by mouth daily.     Marland Kitchen glipiZIDE (GLUCOTROL XL) 5 MG 24 hr tablet Take 1 tablet (5 mg total) by mouth daily with breakfast. 90 tablet 4  . glucose blood (ACCU-CHEK AVIVA) test strip USE TO CHECK BLOOD SUGAR DAILY AND PRN 100 each 12  . ibuprofen (ADVIL,MOTRIN) 200 MG tablet Take 400 mg by mouth as needed.    . Lancets (ACCU-CHEK MULTICLIX) lancets USE TO CHECK BLOOD SUGAR DAILY AND PRN 100 each 4  . levothyroxine (SYNTHROID, LEVOTHROID) 150 MCG tablet Take 1 tablet (150 mcg total) by mouth daily before breakfast. 90 tablet 3  . lisinopril (PRINIVIL,ZESTRIL) 20 MG tablet Take 1 tablet (20 mg total) by mouth daily. 90 tablet 3  . loratadine (CLARITIN) 10 MG tablet Take 10 mg by mouth daily.    . meclizine (ANTIVERT) 25 MG tablet Take 1 tablet (25 mg total) by mouth  3 (three) times daily as needed for dizziness. 40 tablet 2  . metFORMIN (GLUCOPHAGE) 1000 MG tablet Take 1 tablet (1,000 mg total) by mouth 2 (two) times daily with a meal. 180 tablet 4  . Multiple Vitamins-Minerals (MULTI-VITAMIN GUMMIES PO) Take 2 each by mouth daily.    . propranolol (INDERAL) 20 MG tablet take 1 tablet by mouth twice a day 180 tablet 3  . simvastatin (ZOCOR) 20 MG tablet Take 1 tablet (20 mg total) by mouth at bedtime. 90 tablet 3  . aspirin EC 81 MG tablet Take 81 mg by mouth daily. Reported on 05/06/2015    . clotrimazole (GYNE-LOTRIMIN) 1 % vaginal cream Place 1 Applicatorful vaginally at bedtime. (Patient not taking: Reported on 09/15/2016) 45 g 0  . phentermine 37.5 MG capsule Take 1 capsule (37.5 mg total) by mouth every morning. Needs office refills for further refills 90 capsule 1  . sodium chloride (MURO 128) 2 % ophthalmic solution Place 1-2 drops into both eyes daily as needed (dry eye).    . fluconazole (DIFLUCAN) 150 MG tablet Take 1 tablet (150 mg total) by mouth  once. 1 tablet 0   No facility-administered medications prior to visit.      EXAM:  BP 140/60 (BP Location: Left Arm, Patient Position: Sitting, Cuff Size: Normal)   Pulse 96   Temp 97.7 F (36.5 C) (Oral)   There is no height or weight on file to calculate BMI.  GENERAL: vitals reviewed and listed above, alert, oriented, appears well hydrated and in no acute distress HEENT: atraumatic, conjunctiva  clear, no obvious abnormalities on inspection of external nose  Ext gu  Irritated labia  Some excoriation left more than right ans some fissure superior  perineal  No dc note  Exam uncomfortable but  Smear for yeast cytology done    PSYCH: pleasant and cooperative,   ASSESSMENT AND PLAN:  Discussed the following assessment and plan:  Perineal itching, female - Plan: Cervicovaginal ancillary only, CANCELED: Cervicovaginal ancillary only Sp rx with diflucan  Looks monilial or chornic without lesion at this time  Uncomfortable exam  Use terazole external  Qd or bid and    1/2 hcs and fu if  persistent or progressive    Atrophic possible but  Seems more inflamed than that.  -Patient advised to return or notify health care team  if symptoms worsen ,persist or new concerns arise.  Patient Instructions  The irritated area looks more like he although you had a good treatment for DTs. We'll have you use Differin cream realizing that you will be able to use the applicator you can use the cream on the outside every day. We'll wait for lab test to get back to see if they found [place]. If it is itching very terribly you continues a half percent hydrocortisone cream over-the-counter only on the outside. So you dont need to use the applicator    Sometimes diabetes and antibiotics can make this happen. Also sometimes wake moist environment will aggravate the problem. Sometimes estrogen creams can help .         Standley Brooking. Panosh M.D.

## 2016-09-15 NOTE — Patient Instructions (Addendum)
The irritated area looks more like he although you had a good treatment for DTs. We'll have you use Differin cream realizing that you will be able to use the applicator you can use the cream on the outside every day. We'll wait for lab test to get back to see if they found [place]. If it is itching very terribly you continues a half percent hydrocortisone cream over-the-counter only on the outside. So you dont need to use the applicator    Sometimes diabetes and antibiotics can make this happen. Also sometimes wake moist environment will aggravate the problem. Sometimes estrogen creams can help .

## 2016-09-20 ENCOUNTER — Other Ambulatory Visit: Payer: Self-pay | Admitting: Internal Medicine

## 2016-09-20 NOTE — Telephone Encounter (Signed)
Pharmacy calling stating the pt need new Rx for levothyroxine   Pharm: [facility]

## 2016-09-21 LAB — CERVICOVAGINAL ANCILLARY ONLY
Bacterial vaginitis: NEGATIVE
Candida vaginitis: POSITIVE — AB
Trichomonas: NEGATIVE

## 2016-09-22 ENCOUNTER — Other Ambulatory Visit: Payer: Self-pay | Admitting: Emergency Medicine

## 2016-09-22 ENCOUNTER — Telehealth: Payer: Self-pay | Admitting: Internal Medicine

## 2016-09-22 MED ORDER — TERCONAZOLE 0.4 % VA CREA: 1.0000 | TOPICAL_CREAM | Freq: Every day | VAGINAL | 0 refills | Status: DC

## 2016-09-22 NOTE — Telephone Encounter (Signed)
Medication has been sent in 

## 2016-09-22 NOTE — Telephone Encounter (Signed)
Medication has been sent to pt pharmacy.  

## 2016-09-22 NOTE — Telephone Encounter (Signed)
Pt needs a refill on vaginal cream terconazole / [facility] [address]

## 2016-09-22 NOTE — Telephone Encounter (Signed)
Pt calling to check to status of medication.

## 2016-10-01 ENCOUNTER — Encounter: Payer: Self-pay | Admitting: Internal Medicine

## 2016-10-18 ENCOUNTER — Ambulatory Visit (INDEPENDENT_AMBULATORY_CARE_PROVIDER_SITE_OTHER): Payer: Medicare Other | Admitting: Internal Medicine

## 2016-10-18 ENCOUNTER — Encounter: Payer: Self-pay | Admitting: Internal Medicine

## 2016-10-18 VITALS — BP 162/80 | HR 104 | Temp 98.2°F | Ht 63.0 in | Wt 186.6 lb

## 2016-10-18 DIAGNOSIS — I10 Essential (primary) hypertension: Secondary | ICD-10-CM

## 2016-10-18 DIAGNOSIS — D500 Iron deficiency anemia secondary to blood loss (chronic): Secondary | ICD-10-CM

## 2016-10-18 DIAGNOSIS — B3731 Acute candidiasis of vulva and vagina: Secondary | ICD-10-CM

## 2016-10-18 DIAGNOSIS — E119 Type 2 diabetes mellitus without complications: Secondary | ICD-10-CM

## 2016-10-18 DIAGNOSIS — B373 Candidiasis of vulva and vagina: Secondary | ICD-10-CM

## 2016-10-18 DIAGNOSIS — E038 Other specified hypothyroidism: Secondary | ICD-10-CM

## 2016-10-18 MED ORDER — TERCONAZOLE 0.4 % VA CREA: 1.0000 | TOPICAL_CREAM | Freq: Every day | VAGINAL | 0 refills | Status: DC

## 2016-10-18 MED ORDER — FLUCONAZOLE 150 MG PO TABS: ORAL_TABLET | ORAL | 0 refills | Status: DC

## 2016-10-18 MED ORDER — FLUCONAZOLE 150 MG PO TABS: 150.0000 mg | ORAL_TABLET | Freq: Once | ORAL | 0 refills | Status: DC

## 2016-10-18 NOTE — Progress Notes (Signed)
Subjective:    Patient ID: Leslie Benton, female    DOB: 1940/09/19, 76 y.o.   MRN: [id number]  HPI  76 year old patient who has type 2 diabetes.  She was seen on May 30 for recurrent vulvovaginal candidiasis.  This was confirmed with laboratory testing She has a history of iron deficiency anemia and has had GI evaluation. The patient has not been compliant with her diabetic medications.  She states that she only takes metformin once daily if at all. Her last hemoglobin A1c was 10 point 5. 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  Past Medical History:  Diagnosis Date  . Anemia   . Anxiety   . Cervical spondylosis without myelopathy 05/29/2015  . COLONIC POLYPS, HX OF 02/07/2008  . DEGENERATIVE JOINT DISEASE 10/14/2006   03/31/11: Pt states arthritis in her hip.  . Depression   . DIABETES MELLITUS, TYPE II 10/27/2009  . GERD 10/14/2006  . HYPERLIPIDEMIA 10/14/2006  . HYPERTENSION 10/14/2006   off bp meds now  . HYPOTHYROIDISM 10/14/2006  . LOW BACK PAIN 08/04/2007  . Obesity      Social History   Social History  . Marital status: Married    Spouse name: N/A  . Number of children: 2  . Years of education: college   Occupational History  . retired    Social History Main Topics  . Smoking status: Never Smoker  . Smokeless tobacco: Never Used     Comment: Never Used Tobacco  . Alcohol use No  . Drug use: No  . Sexual activity: Not on file   Other Topics Concern  . Not on file   Social History Narrative   Lives with husband and son.     Patient drinks 1-2 cups of caffeine daily.   Patient is left handed.     Past Surgical History:  Procedure Laterality Date  . HEMORRHOID SURGERY    . Left knee arthroscopic  April 2014   Dr. Durward Fortes  . PARTIAL KNEE ARTHROPLASTY Left 09/23/2014   Procedure: LEFT UNICOMPARTMENTAL KNEE;  Surgeon: [name], MD;  Location: [facility];  Service: Orthopedics;  Laterality: Left;  . TONSILLECTOMY    . TUBAL LIGATION      Family History  Problem Relation Age of Onset  . Heart attack Father 82  . Kidney failure Mother   . Cancer Sister        endometrial  . Cancer Brother        Adrenal    Allergies  Allergen Reactions  . Niacin And Related Hives and Swelling    No anaphalaxis, but strong reactions.  . Lactose Intolerance (Gi)     "bad taste in mouth"  . Amoxicillin Swelling    hives  . Penicillins Swelling    Hives With all cillins    Current Outpatient Prescriptions on File Prior to Visit  Medication Sig Dispense Refill  . acetaminophen (TYLENOL) 500 MG tablet Take 500 mg by mouth every 6 (six) hours as needed for moderate pain or headache.    Marland Kitchen aspirin EC 81 MG tablet Take 81 mg by mouth daily. Reported on 05/06/2015    . Blood Glucose Monitoring Suppl (ACCU-CHEK AVIVA PLUS) W/DEVICE KIT USE AS DIRECTED TO CHECK BLOOD SUGARS 1 kit 0  . busPIRone (BUSPAR) 15 MG tablet Take 15 mg by mouth daily.     Marland Kitchen glipiZIDE (GLUCOTROL XL)  5 MG 24 hr tablet Take 1 tablet (5 mg total) by mouth daily with breakfast. 90 tablet 4  . glucose blood (ACCU-CHEK AVIVA) test strip USE TO CHECK BLOOD SUGAR DAILY AND PRN 100 each 12  . ibuprofen (ADVIL,MOTRIN) 200 MG tablet Take 400 mg by mouth as needed.    . Lancets (ACCU-CHEK MULTICLIX) lancets USE TO CHECK BLOOD SUGAR DAILY AND PRN 100 each 4  . levothyroxine (SYNTHROID, LEVOTHROID) 150 MCG tablet take 1 tablet by mouth every morning ON AN EMPTY STOMACH BEFORE BREAKFAST 90 tablet 3  . lisinopril (PRINIVIL,ZESTRIL) 20 MG tablet Take 1 tablet (20 mg total) by mouth daily. 90 tablet 3  . loratadine (CLARITIN) 10 MG tablet Take 10 mg by mouth daily.    . meclizine (ANTIVERT) 25 MG tablet Take 1 tablet (25 mg total) by mouth 3 (three) times daily as needed for dizziness. 40 tablet 2  . metFORMIN (GLUCOPHAGE) 1000 MG tablet Take 1 tablet (1,000 mg total) by mouth 2 (two) times daily  with a meal. 180 tablet 4  . Multiple Vitamins-Minerals (MULTI-VITAMIN GUMMIES PO) Take 2 each by mouth daily.    . phentermine 37.5 MG capsule Take 1 capsule (37.5 mg total) by mouth every morning. Needs office refills for further refills 90 capsule 1  . propranolol (INDERAL) 20 MG tablet take 1 tablet by mouth twice a day 180 tablet 3  . simvastatin (ZOCOR) 20 MG tablet Take 1 tablet (20 mg total) by mouth at bedtime. 90 tablet 3  . sodium chloride (MURO 128) 2 % ophthalmic solution Place 1-2 drops into both eyes daily as needed (dry eye).    . clotrimazole (GYNE-LOTRIMIN) 1 % vaginal cream Place 1 Applicatorful vaginally at bedtime. (Patient not taking: Reported on 09/15/2016) 45 g 0  . terconazole (TERAZOL 7) 0.4 % vaginal cream Place 1 applicator vaginally at bedtime. Can use on outside  Twice a day . (Patient not taking: Reported on 10/18/2016) 45 g 0   No current facility-administered medications on file prior to visit.     BP (!) 162/80 (BP Location: Left Arm, Patient Position: Sitting, Cuff Size: Normal)   Pulse (!) 104   Temp 98.2 F (36.8 C) (Oral)   Ht _0  (1.6 m)   Wt 186 lb 9.6 oz (84.6 kg)   SpO2 97%   BMI 33.05 kg/m     Review of Systems  Skin: Positive for rash.       Vaginal pruritus  Psychiatric/Behavioral: Positive for behavioral problems, confusion and decreased concentration.       Objective:   Physical Exam  Constitutional: She appears well-developed and well-nourished. No distress.  Repeat blood pressure 140/80          Assessment & Plan:   Recurrent vulvovaginal candidiasis Uncontrolled diabetes Noncompliance Essential hypertension History of iron deficiency anemia  We'll continue topical treatment with terconazole and retreat with fluconazole times 2 Patient made aware that will have recurrent and persistent problems unless her diabetes is well controlled Local factors.  Discussed at length and detailed instructions provided  Check  CBC  Follow-up one month with hemoglobin A1c  [name]

## 2016-10-18 NOTE — Patient Instructions (Addendum)
Take all your medications faithfully  Return in one month for follow-up  Please track home blood sugars  Follow these instructions at home: Skin care  Keep the affected area dry.  Do not apply ointments or creams that contain mineral oil or petroleum ingredients to your skin. These can make the condition worse.  Apply cool compresses to the affected areas.  Do not scratch your skin.  Do not take hot showers or baths. General instructions  Take over-the-counter and prescription medicines only as told by your health care provider.  If you were prescribed an antibiotic, take it as told by your health care provider. Do not stop taking it even if your condition improves.  Stay in a cool room as much as possible. Use an air conditioner or fan, if possible.  Do not wear tight clothes. Wear comfortable, loose-fitting clothing.  Keep all follow-up visits as told by your health care provider. This is important.

## 2016-10-26 DIAGNOSIS — H2513 Age-related nuclear cataract, bilateral: Secondary | ICD-10-CM | POA: Diagnosis not present

## 2016-10-26 DIAGNOSIS — H40053 Ocular hypertension, bilateral: Secondary | ICD-10-CM | POA: Diagnosis not present

## 2016-10-26 DIAGNOSIS — E119 Type 2 diabetes mellitus without complications: Secondary | ICD-10-CM | POA: Diagnosis not present

## 2016-11-18 ENCOUNTER — Other Ambulatory Visit: Payer: Self-pay | Admitting: Internal Medicine

## 2016-11-18 DIAGNOSIS — E119 Type 2 diabetes mellitus without complications: Secondary | ICD-10-CM

## 2016-11-19 ENCOUNTER — Ambulatory Visit: Payer: Medicare Other | Admitting: Internal Medicine

## 2016-11-22 ENCOUNTER — Encounter: Payer: Self-pay | Admitting: Internal Medicine

## 2016-11-22 ENCOUNTER — Ambulatory Visit (INDEPENDENT_AMBULATORY_CARE_PROVIDER_SITE_OTHER): Payer: Medicare Other | Admitting: Internal Medicine

## 2016-11-22 VITALS — BP 140/82 | HR 100 | Temp 97.8°F | Ht 63.0 in | Wt 186.2 lb

## 2016-11-22 DIAGNOSIS — E039 Hypothyroidism, unspecified: Secondary | ICD-10-CM | POA: Diagnosis not present

## 2016-11-22 DIAGNOSIS — I10 Essential (primary) hypertension: Secondary | ICD-10-CM | POA: Diagnosis not present

## 2016-11-22 DIAGNOSIS — M150 Primary generalized (osteo)arthritis: Secondary | ICD-10-CM

## 2016-11-22 DIAGNOSIS — E119 Type 2 diabetes mellitus without complications: Secondary | ICD-10-CM

## 2016-11-22 DIAGNOSIS — M159 Polyosteoarthritis, unspecified: Secondary | ICD-10-CM

## 2016-11-22 LAB — POCT GLYCOSYLATED HEMOGLOBIN (HGB A1C): HEMOGLOBIN A1C: 12.3

## 2016-11-22 MED ORDER — BUSPIRONE HCL 15 MG PO TABS: 15.0000 mg | ORAL_TABLET | Freq: Every day | ORAL | 3 refills | Status: DC

## 2016-11-22 MED ORDER — LEVOTHYROXINE SODIUM 150 MCG PO TABS: ORAL_TABLET | ORAL | 3 refills | Status: DC

## 2016-11-22 MED ORDER — PHENTERMINE HCL 37.5 MG PO CAPS: 37.5000 mg | ORAL_CAPSULE | ORAL | 0 refills | Status: DC

## 2016-11-22 MED ORDER — FLUCONAZOLE 150 MG PO TABS: ORAL_TABLET | ORAL | 0 refills | Status: DC

## 2016-11-22 MED ORDER — METFORMIN HCL 1000 MG PO TABS: 1000.0000 mg | ORAL_TABLET | Freq: Two times a day (BID) | ORAL | 4 refills | Status: DC

## 2016-11-22 MED ORDER — PHENTERMINE HCL 30 MG PO CAPS: 30.0000 mg | ORAL_CAPSULE | ORAL | 0 refills | Status: DC

## 2016-11-22 MED ORDER — PIOGLITAZONE HCL 30 MG PO TABS: 30.0000 mg | ORAL_TABLET | Freq: Every day | ORAL | 3 refills | Status: DC

## 2016-11-22 MED ORDER — PROPRANOLOL HCL 20 MG PO TABS: 20.0000 mg | ORAL_TABLET | Freq: Two times a day (BID) | ORAL | 3 refills | Status: DC

## 2016-11-22 MED ORDER — SIMVASTATIN 20 MG PO TABS: 20.0000 mg | ORAL_TABLET | Freq: Every day | ORAL | 3 refills | Status: DC

## 2016-11-22 MED ORDER — LISINOPRIL 20 MG PO TABS: 20.0000 mg | ORAL_TABLET | Freq: Every day | ORAL | 3 refills | Status: DC

## 2016-11-22 NOTE — Progress Notes (Signed)
Subjective:    Patient ID: Leslie Benton, female    DOB: [birth year], 76 y.o.   MRN: [id number]  HPI  76 year old patient who is seen today in follow-up. She has a history of type 2 diabetes which has been poorly controlled  Lab Results  Component Value Date   HGBA1C 10.5 (H) 07/30/2016   Presently she is on a regimen of metformin and glipizide. She has a history of osteoarthritis, hypothyroidism.  She remains on statin therapy.  She has had considerable situational stress with the poor health of her husband.  He was discharged from the hospital about 10 days ago. There has been considerable conflicts with [facility] home care services as well as conflicts with other family members, especially her daughter. Her daughter has attempted to have a ramp installed at her residence. Patient feels that her daughter and agents from the home care service are very controlling She does have a son who assists with care at home.   Her husband has had a stroke with Deconditioning and left hemiparesis.  Wt Readings from Last 3 Encounters:  11/22/16 186 lb 3.2 oz (84.5 kg)  10/18/16 186 lb 9.6 oz (84.6 kg)  07/30/16 193 lb 6.4 oz (87.7 kg)    Past Medical History:  Diagnosis Date  . Anemia   . Anxiety   . Cervical spondylosis without myelopathy 05/29/2015  . COLONIC POLYPS, HX OF 02/07/2008  . DEGENERATIVE JOINT DISEASE 10/14/2006   03/31/11: Pt states arthritis in her hip.  . Depression   . DIABETES MELLITUS, TYPE II 10/27/2009  . GERD 10/14/2006  . HYPERLIPIDEMIA 10/14/2006  . HYPERTENSION 10/14/2006   off bp meds now  . HYPOTHYROIDISM 10/14/2006  . LOW BACK PAIN 08/04/2007  . Obesity      Social History   Social History  . Marital status: Married    Spouse name: N/A  . Number of children: 2  . Years of education: college   Occupational History  . retired    Social History Main Topics  . Smoking status: Never Smoker  . Smokeless tobacco: Never Used     Comment: Never Used Tobacco    . Alcohol use No  . Drug use: No  . Sexual activity: Not on file   Other Topics Concern  . Not on file   Social History Narrative   Lives with husband and son.     Patient drinks 1-2 cups of caffeine daily.   Patient is left handed.     Past Surgical History:  Procedure Laterality Date  . HEMORRHOID SURGERY    . Left knee arthroscopic  April 2014   Dr. Durward Fortes  . PARTIAL KNEE ARTHROPLASTY Left 09/23/2014   Procedure: LEFT UNICOMPARTMENTAL KNEE;  Surgeon: [name], MD;  Location: [facility];  Service: Orthopedics;  Laterality: Left;  . TONSILLECTOMY    . TUBAL LIGATION      Family History  Problem Relation Age of Onset  . Heart attack Father 74  . Kidney failure Mother   . Cancer Sister        endometrial  . Cancer Brother        Adrenal    Allergies  Allergen Reactions  . Niacin And Related Hives and Swelling    No anaphalaxis, but strong reactions.  . Lactose Intolerance (Gi)     "bad taste in mouth"  . Amoxicillin Swelling    hives  . Penicillins Swelling    Hives With all cillins    Current  Outpatient Prescriptions on File Prior to Visit  Medication Sig Dispense Refill  . acetaminophen (TYLENOL) 500 MG tablet Take 500 mg by mouth every 6 (six) hours as needed for moderate pain or headache.    . Blood Glucose Monitoring Suppl (ACCU-CHEK AVIVA PLUS) W/DEVICE KIT USE AS DIRECTED TO CHECK BLOOD SUGARS 1 kit 0  . busPIRone (BUSPAR) 15 MG tablet Take 15 mg by mouth daily.     Marland Kitchen glipiZIDE (GLUCOTROL XL) 5 MG 24 hr tablet Take 1 tablet (5 mg total) by mouth daily with breakfast. 90 tablet 4  . glucose blood (ACCU-CHEK AVIVA) test strip USE TO CHECK BLOOD SUGAR DAILY AND PRN 100 each 12  . ibuprofen (ADVIL,MOTRIN) 200 MG tablet Take 400 mg by mouth as needed.    . Lancets (ACCU-CHEK MULTICLIX) lancets USE TO CHECK BLOOD SUGAR DAILY AND PRN 100 each 4  . levothyroxine (SYNTHROID, LEVOTHROID) 150 MCG tablet take 1 tablet by mouth every morning ON AN EMPTY STOMACH  BEFORE BREAKFAST 90 tablet 3  . lisinopril (PRINIVIL,ZESTRIL) 20 MG tablet Take 1 tablet (20 mg total) by mouth daily. 90 tablet 3  . loratadine (CLARITIN) 10 MG tablet Take 10 mg by mouth daily.    . metFORMIN (GLUCOPHAGE) 1000 MG tablet Take 1 tablet (1,000 mg total) by mouth 2 (two) times daily with a meal. 180 tablet 4  . Multiple Vitamins-Minerals (MULTI-VITAMIN GUMMIES PO) Take 2 each by mouth daily.    . propranolol (INDERAL) 20 MG tablet take 1 tablet by mouth twice a day 180 tablet 3  . simvastatin (ZOCOR) 20 MG tablet Take 1 tablet (20 mg total) by mouth at bedtime. 90 tablet 3  . sodium chloride (MURO 128) 2 % ophthalmic solution Place 1-2 drops into both eyes daily as needed (dry eye).     No current facility-administered medications on file prior to visit.     BP 140/82   Pulse 100   Temp 97.8 F (36.6 C) (Oral)   Ht 5' 3"  (1.6 m)   Wt 186 lb 3.2 oz (84.5 kg)   BMI 32.98 kg/m     Review of Systems  Constitutional: Negative.   HENT: Negative for congestion, dental problem, hearing loss, rhinorrhea, sinus pressure, sore throat and tinnitus.   Eyes: Negative for pain, discharge and visual disturbance.  Respiratory: Negative for cough and shortness of breath.   Cardiovascular: Negative for chest pain, palpitations and leg swelling.  Gastrointestinal: Negative for abdominal distention, abdominal pain, blood in stool, constipation, diarrhea, nausea and vomiting.  Genitourinary: Negative for difficulty urinating, dysuria, flank pain, frequency, hematuria, pelvic pain, urgency, vaginal bleeding, vaginal discharge and vaginal pain.  Musculoskeletal: Negative for arthralgias, gait problem and joint swelling.  Skin: Negative for rash.  Neurological: Negative for dizziness, syncope, speech difficulty, weakness, numbness and headaches.  Hematological: Negative for adenopathy.  Psychiatric/Behavioral: Positive for agitation, behavioral problems and sleep disturbance. Negative for  dysphoric mood. The patient is nervous/anxious.        Objective:   Physical Exam  Constitutional: She is oriented to person, place, and time. She appears well-developed and well-nourished.  Anxious and upset Blood pressure 140/80  HENT:  Head: Normocephalic.  Right Ear: External ear normal.  Left Ear: External ear normal.  Mouth/Throat: Oropharynx is clear and moist.  Eyes: Pupils are equal, round, and reactive to light. Conjunctivae and EOM are normal.  Neck: Normal range of motion. Neck supple. No thyromegaly present.  Cardiovascular: Normal rate, regular rhythm, normal heart sounds and intact  distal pulses.   Pulmonary/Chest: Effort normal and breath sounds normal.  Abdominal: Soft. Bowel sounds are normal. She exhibits no mass. There is no tenderness.  Musculoskeletal: Normal range of motion.  Lymphadenopathy:    She has no cervical adenopathy.  Neurological: She is alert and oriented to person, place, and time.  Skin: Skin is warm and dry. No rash noted.  Psychiatric: She has a normal mood and affect. Her behavior is normal.          Assessment & Plan:   Diabetes mellitus.  Hemoglobin A1c 13.5.  Insulin therapy discussed and strongly encouraged.  Patient refuses at this time.  She has not been compliant with her medications, nor diet.  She does agree to insulin therapy if there is not marked improvement in 3 months.  She denies any hyperglycemic symptoms .  Will add pioglitazone 30 mg daily  Noncompliance  Osteoarthritis, hypothyroidism Obesity Considerable situational stress  Nonpharmacologic measures discussed Recheck 3 months  [name]

## 2016-11-22 NOTE — Patient Instructions (Signed)
Limit your sodium (Salt) intake   Please check your hemoglobin A1c every 3 months  You need to lose weight.  Consider a lower calorie diet and regular exercise.  Take all your medications faithfully

## 2016-11-23 ENCOUNTER — Telehealth: Payer: Self-pay | Admitting: Internal Medicine

## 2016-11-23 ENCOUNTER — Other Ambulatory Visit: Payer: Self-pay | Admitting: Internal Medicine

## 2016-11-23 ENCOUNTER — Ambulatory Visit (INDEPENDENT_AMBULATORY_CARE_PROVIDER_SITE_OTHER)
Admission: RE | Admit: 2016-11-23 | Discharge: 2016-11-23 | Disposition: A | Discharge status: Home / Self Care | Payer: Medicare Other | Source: Ambulatory Visit | Attending: Internal Medicine | Admitting: Internal Medicine

## 2016-11-23 ENCOUNTER — Ambulatory Visit (INDEPENDENT_AMBULATORY_CARE_PROVIDER_SITE_OTHER): Payer: Medicare Other | Admitting: Internal Medicine

## 2016-11-23 ENCOUNTER — Encounter: Payer: Self-pay | Admitting: Internal Medicine

## 2016-11-23 VITALS — BP 128/76 | HR 97 | Temp 98.3°F | Ht 63.0 in | Wt 191.0 lb

## 2016-11-23 DIAGNOSIS — M7989 Other specified soft tissue disorders: Secondary | ICD-10-CM

## 2016-11-23 DIAGNOSIS — M79644 Pain in right finger(s): Secondary | ICD-10-CM | POA: Diagnosis not present

## 2016-11-23 NOTE — Patient Instructions (Signed)
   Apply ice to the affected area. ? Put ice in a plastic bag. ? Place a towel between your skin and the bag. ? Leave the ice on for 20 minutes, 2-3 times a day. ?   Move the fingers or toes of the affected limb often, if this applies. This can help to prevent stiffness and lessen swelling.    If directed, elevate the affected area above the level of your heart while you are sitting or lying down.

## 2016-11-23 NOTE — Telephone Encounter (Signed)
Ask  patient to come in with her husband who has an appointment later this afternoon.  We can x-ray the finger if felt to be appropriate

## 2016-11-23 NOTE — Telephone Encounter (Signed)
Pharm is calling patient last had refill on 10/20/16 for 90 day supply of phentermine. Pt is telling pharm she is out. Please advise

## 2016-11-23 NOTE — Progress Notes (Signed)
Subjective:    Patient ID: Leslie Benton, female    DOB: [birth year], 76 y.o.   MRN: [id number]  HPI  76 year old patient who was seen yesterday for follow-up.  She neglected to mention pain and swelling of several days duration involving her right index finger.  No obvious trauma, but she has been involved in the care of her disabled husband who often requires assistance with transfers.  She feels that she may have injured her finger during these activities  Past Medical History:  Diagnosis Date  . Anemia   . Anxiety   . Cervical spondylosis without myelopathy 05/29/2015  . COLONIC POLYPS, HX OF 02/07/2008  . DEGENERATIVE JOINT DISEASE 10/14/2006   03/31/11: Pt states arthritis in her hip.  . Depression   . DIABETES MELLITUS, TYPE II 10/27/2009  . GERD 10/14/2006  . HYPERLIPIDEMIA 10/14/2006  . HYPERTENSION 10/14/2006   off bp meds now  . HYPOTHYROIDISM 10/14/2006  . LOW BACK PAIN 08/04/2007  . Obesity      Social History   Social History  . Marital status: Married    Spouse name: N/A  . Number of children: 2  . Years of education: college   Occupational History  . retired    Social History Main Topics  . Smoking status: Never Smoker  . Smokeless tobacco: Never Used     Comment: Never Used Tobacco  . Alcohol use No  . Drug use: No  . Sexual activity: Not on file   Other Topics Concern  . Not on file   Social History Narrative   Lives with husband and son.     Patient drinks 1-2 cups of caffeine daily.   Patient is left handed.     Past Surgical History:  Procedure Laterality Date  . HEMORRHOID SURGERY    . Left knee arthroscopic  April 2014   Dr. Durward Fortes  . PARTIAL KNEE ARTHROPLASTY Left 09/23/2014   Procedure: LEFT UNICOMPARTMENTAL KNEE;  Surgeon: [name], MD;  Location: [facility];  Service: Orthopedics;  Laterality: Left;  . TONSILLECTOMY    . TUBAL LIGATION      Family History  Problem Relation Age of Onset  . Heart attack Father 61  . Kidney  failure Mother   . Cancer Sister        endometrial  . Cancer Brother        Adrenal    Allergies  Allergen Reactions  . Niacin And Related Hives and Swelling    No anaphalaxis, but strong reactions.  . Lactose Intolerance (Gi)     "bad taste in mouth"  . Amoxicillin Swelling    hives  . Penicillins Swelling    Hives With all cillins    Current Outpatient Prescriptions on File Prior to Visit  Medication Sig Dispense Refill  . acetaminophen (TYLENOL) 500 MG tablet Take 500 mg by mouth every 6 (six) hours as needed for moderate pain or headache.    . Blood Glucose Monitoring Suppl (ACCU-CHEK AVIVA PLUS) W/DEVICE KIT USE AS DIRECTED TO CHECK BLOOD SUGARS 1 kit 0  . busPIRone (BUSPAR) 15 MG tablet Take 1 tablet (15 mg total) by mouth daily. 180 tablet 3  . glipiZIDE (GLUCOTROL XL) 5 MG 24 hr tablet Take 1 tablet (5 mg total) by mouth daily with breakfast. 90 tablet 4  . glucose blood (ACCU-CHEK AVIVA) test strip USE TO CHECK BLOOD SUGAR DAILY AND PRN 100 each 12  . ibuprofen (ADVIL,MOTRIN) 200 MG tablet Take 400 mg  by mouth as needed.    . Lancets (ACCU-CHEK MULTICLIX) lancets USE TO CHECK BLOOD SUGAR DAILY AND PRN 100 each 4  . levothyroxine (SYNTHROID, LEVOTHROID) 150 MCG tablet take 1 tablet by mouth every morning ON AN EMPTY STOMACH BEFORE BREAKFAST 90 tablet 3  . lisinopril (PRINIVIL,ZESTRIL) 20 MG tablet Take 1 tablet (20 mg total) by mouth daily. 90 tablet 3  . loratadine (CLARITIN) 10 MG tablet Take 10 mg by mouth daily.    . metFORMIN (GLUCOPHAGE) 1000 MG tablet Take 1 tablet (1,000 mg total) by mouth 2 (two) times daily with a meal. 180 tablet 4  . Multiple Vitamins-Minerals (MULTI-VITAMIN GUMMIES PO) Take 2 each by mouth daily.    . phentermine 37.5 MG capsule Take 1 capsule (37.5 mg total) by mouth every morning. 90 capsule 0  . pioglitazone (ACTOS) 30 MG tablet Take 1 tablet (30 mg total) by mouth daily. 90 tablet 3  . propranolol (INDERAL) 20 MG tablet Take 1 tablet (20  mg total) by mouth 2 (two) times daily. 180 tablet 3  . simvastatin (ZOCOR) 20 MG tablet Take 1 tablet (20 mg total) by mouth at bedtime. 90 tablet 3   No current facility-administered medications on file prior to visit.     BP 128/76 (BP Location: Left Arm, Patient Position: Sitting, Cuff Size: Normal)   Pulse 97   Temp 98.3 F (36.8 C) (Oral)   Ht 5' 3"  (1.6 m)   Wt 191 lb (86.6 kg)   SpO2 98%   BMI 33.83 kg/m     Review of Systems  Musculoskeletal:       Pain and swelling right index finger       Objective:   Physical Exam  Constitutional: She appears well-developed and well-nourished. No distress.  Musculoskeletal:  Soft tissue swelling of the right index finger Unable to fully flex the finger due to soft tissue swelling          Assessment & Plan:   Pain and swelling right index finger.  Will review radiographs to rule out fracture. The patient will report any clinical worsening  [name]

## 2016-11-23 NOTE — Telephone Encounter (Signed)
Please advise 

## 2016-11-23 NOTE — Telephone Encounter (Signed)
Pt will come with her husband

## 2016-11-23 NOTE — Telephone Encounter (Signed)
Pt calling state that she is having problems with her index finger on the R hand think that it is possibly broken it is very purple pharmacist told her she needs to check with PCP.  Pt state that she forgot to let Dr. Raliegh Ip know state the finger is throbbing very badly and want to know what she should do.
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
## 2016-11-26 ENCOUNTER — Telehealth: Payer: Self-pay | Admitting: Internal Medicine

## 2016-11-26 MED ORDER — PHENTERMINE HCL 37.5 MG PO CAPS: 37.5000 mg | ORAL_CAPSULE | ORAL | 0 refills | Status: DC

## 2016-11-26 NOTE — Telephone Encounter (Signed)
Please call in a new prescription for phentermine 37.5 #30, directions one daily

## 2016-11-26 NOTE — Telephone Encounter (Signed)
Pt would like dr Burnice [name] to return her  call today  it is   upmost important

## 2016-11-26 NOTE — Telephone Encounter (Signed)
Rx printed awaiting to be signed.  

## 2016-11-26 NOTE — Telephone Encounter (Signed)
Please advise 

## 2016-11-29 ENCOUNTER — Telehealth: Payer: Self-pay | Admitting: Internal Medicine

## 2016-11-29 NOTE — Telephone Encounter (Addendum)
Pt said she get a discount if she get 90 pills of phentermine . Pt only received 30 pills. [facility] [address] . Pt would like callback

## 2016-11-29 NOTE — Telephone Encounter (Signed)
Notify patient that this is a controlled drug and we are unable to fill until September 10; okay to refill #90.  At that time

## 2016-11-29 NOTE — Telephone Encounter (Signed)
Please advise 

## 2016-11-29 NOTE — Telephone Encounter (Signed)
Done

## 2016-11-29 NOTE — Telephone Encounter (Signed)
Pt state that she needs #90 and will be changing her pharmacy as advised by Dr. Marland KitchenK".

## 2016-11-30 NOTE — Telephone Encounter (Signed)
Left message on voicemail to call office.  

## 2016-11-30 NOTE — Telephone Encounter (Signed)
Spoke with patient and informed her that this is a controlled drug and we are unable to fill until September 10; okay to refill #90.  At that time. Pt verbalized understanding.   Pt is requesting Dr Raliegh Ip to call her today in regards ti a personal matter.

## 2016-12-22 ENCOUNTER — Telehealth: Payer: Self-pay | Admitting: Internal Medicine

## 2016-12-22 NOTE — Telephone Encounter (Signed)
Pt need new Rx for phentermine 37.5 MG  #90 (would like to have and will save her money) and would like to have a wand to check her A1C instead of sticking herself.   Pharm:  [facility] ([facility] on [address].  Pt would like to have a call back when this is called in to the pharmacy.

## 2016-12-23 NOTE — Telephone Encounter (Signed)
Left message on voicemail to call office.  

## 2016-12-28 ENCOUNTER — Other Ambulatory Visit: Payer: Self-pay | Admitting: Internal Medicine

## 2016-12-28 MED ORDER — PHENTERMINE HCL 37.5 MG PO CAPS: 37.5000 mg | ORAL_CAPSULE | ORAL | 0 refills | Status: DC

## 2017-01-04 ENCOUNTER — Telehealth: Payer: Self-pay | Admitting: Internal Medicine

## 2017-01-04 NOTE — Telephone Encounter (Signed)
Leslie Benton pt is calling wanting to know the name of the social worker that she is suppose to be helping her out with her husband who has had a stroke. She would like to have a call back so that she can discuss this.

## 2017-01-06 ENCOUNTER — Encounter: Payer: Self-pay | Admitting: Internal Medicine

## 2017-01-07 NOTE — Telephone Encounter (Signed)
I do not have that information.

## 2017-02-01 ENCOUNTER — Telehealth: Payer: Self-pay | Admitting: *Deleted

## 2017-02-01 DIAGNOSIS — R410 Disorientation, unspecified: Secondary | ICD-10-CM

## 2017-02-01 NOTE — Telephone Encounter (Signed)
Patient called requesting to speak with [name] only. Patient states she would greatly appreciate hearing from the nurse as soon as possible. [phone]

## 2017-02-03 NOTE — Telephone Encounter (Signed)
Please set up home health referral, so they can evaluate her needs and assist

## 2017-02-03 NOTE — Telephone Encounter (Signed)
Home health referral was placed

## 2017-02-03 NOTE — Addendum Note (Signed)
Addended by: Abelardo Diesel on: 02/03/2017 01:18 PM   Modules accepted: Orders

## 2017-02-03 NOTE — Telephone Encounter (Signed)
Spoke with pt and she states she need a Education officer, museum referral  because "she is not detailing well with her circumstances" pt would not go into further detail.  Please advise.

## 2017-02-24 ENCOUNTER — Ambulatory Visit: Payer: Medicare Other | Admitting: Internal Medicine

## 2017-02-28 ENCOUNTER — Ambulatory Visit: Payer: Medicare Other | Admitting: Internal Medicine

## 2017-03-11 ENCOUNTER — Other Ambulatory Visit: Payer: Self-pay | Admitting: Internal Medicine

## 2017-03-23 ENCOUNTER — Telehealth: Payer: Self-pay | Admitting: Internal Medicine

## 2017-03-23 NOTE — Telephone Encounter (Signed)
Copied from [place]. Topic: Quick Communication - Rx Refill/Question >> Mar 23, 2017 12:16 PM Lennox Solders wrote: Has the patient contacted their pharmacy? Yes (Agent: If no, request that the patient contact the pharmacy for the refill.) Preferred Pharmacy (with phone number or street name): [facility] [phone] Agent: Please be advised that RX refills may take up to 3 business days. We ask that you follow-up with your pharmacy. Pharm called pt and let her know her lisinopril is ready for pick up. Pt did not pick up med she said dr Burnice [name]  took her out med. Please confirm

## 2017-03-23 NOTE — Telephone Encounter (Signed)
Called pt regarding her Lisinopril . She had called stating that Dr. Burnice [name] had taken her off this med. I could not that in the chart. Left message for patient to return call to the office.

## 2017-03-24 NOTE — Telephone Encounter (Signed)
Please clarify, should pt be taking Lisinopril? Please advise.

## 2017-03-24 NOTE — Telephone Encounter (Signed)
Yes lisinopril 20 mg daily

## 2017-03-25 ENCOUNTER — Other Ambulatory Visit: Payer: Self-pay | Admitting: Internal Medicine

## 2017-03-25 MED ORDER — LISINOPRIL 20 MG PO TABS: 20.0000 mg | ORAL_TABLET | Freq: Every day | ORAL | 3 refills | Status: DC

## 2017-03-25 NOTE — Telephone Encounter (Signed)
Pt was made aware.  

## 2017-05-20 ENCOUNTER — Ambulatory Visit (INDEPENDENT_AMBULATORY_CARE_PROVIDER_SITE_OTHER): Payer: Medicare Other | Admitting: *Deleted

## 2017-05-20 DIAGNOSIS — Z23 Encounter for immunization: Secondary | ICD-10-CM | POA: Diagnosis not present

## 2017-06-03 ENCOUNTER — Other Ambulatory Visit: Payer: Self-pay | Admitting: Family Medicine

## 2017-06-03 NOTE — Telephone Encounter (Signed)
Copied from [place] #[id number]. Topic: Quick Communication - See Telephone Encounter >> Jun 03, 2017 11:58 AM Oneta Rack wrote: CRM for notification. See Telephone encounter for:   06/03/17.   Relation to pt: self Call back number: [phone] Pharmacy: [place], [place] - [place] [phone] (Phone) [phone] (Fax)    Reason for call:   Patient requesting phentermine 37.5 MG capsule and  metFORMIN (GLUCOPHAGE) 1000 MG tablet, patient unable to contact pharmacy, please advise >> Jun 03, 2017 12:01 PM Oneta Rack wrote: CRM for notification. See Telephone encounter for:   06/03/17.   Relation to pt: self Call back number: [phone] Pharmacy: [place], [place] [phone] (Phone) [phone] (Fax)    Reason for call:   Patient requesting phentermine 37.5 MG capsule and  metFORMIN (GLUCOPHAGE) 1000 MG tablet, patient unable to contact pharmacy, please advise

## 2017-06-06 ENCOUNTER — Other Ambulatory Visit: Payer: Self-pay | Admitting: Internal Medicine
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
## 2017-06-07 NOTE — Telephone Encounter (Signed)
Phentermine 37.5 refill Last OV: 11/23/16 Last Refill:03/15/17 [facility] [address]

## 2017-06-08 ENCOUNTER — Other Ambulatory Visit: Payer: Self-pay | Admitting: Internal Medicine

## 2017-06-08 NOTE — Telephone Encounter (Signed)
Metformin filled on 11/22/16, 90 day supply w/ 4 refills. Patient notified that she can contact the pharmacy for refill of this and she states the pharmacy already contacted her and let her know it is ready for pick up. See 06/06/17 phone encounter regarding phentermine refill.

## 2017-06-08 NOTE — Telephone Encounter (Signed)
Patient would like refill sent electronically please.

## 2017-06-08 NOTE — Telephone Encounter (Signed)
Please advise refill? 

## 2017-06-09 ENCOUNTER — Telehealth: Payer: Self-pay

## 2017-06-09 MED ORDER — PHENTERMINE HCL 37.5 MG PO CAPS: 37.5000 mg | ORAL_CAPSULE | Freq: Every morning | ORAL | 0 refills | Status: DC

## 2017-06-09 NOTE — Telephone Encounter (Signed)
Phentermine okayed Per Dr.Kwiatkowski #30 no refills in electronically.

## 2017-06-09 NOTE — Telephone Encounter (Signed)
Okay for medication refill

## 2017-06-10 NOTE — Telephone Encounter (Signed)
Pt called - she called pharmacy and they told her that she needs to wait 17 days until she can pick up her medication. She would like to pick up meds today.  Please call [phone] at your earliest convenience

## 2017-06-10 NOTE — Telephone Encounter (Signed)
Rx faxed to pharmacy by Gwenyth Ober, [place]. Pt notified of and verbalized understanding.

## 2017-06-10 NOTE — Telephone Encounter (Signed)
Pharmacy notified that Dr. Lonni Fix refill for yesterday. They will fill and they will call patient when rx is ready for pick up.

## 2017-06-20 ENCOUNTER — Telehealth: Payer: Self-pay

## 2017-06-20 DIAGNOSIS — E119 Type 2 diabetes mellitus without complications: Secondary | ICD-10-CM

## 2017-06-20 NOTE — Telephone Encounter (Signed)
Patient has called in requesting assistance or case worker to talk to about issues she has been having since husband had a stroke.   Hoyle Sauer - any ideas? Thanks!    [place] Patient Name: Leslie Benton Gender: Female DOB: 13-Dec-1940 Age: 77 Y 2 M 2 D Return Phone Number: [phone] (Primary) Address: City/State/Zip: [place] [address] Client [facility] - Client Client Site [facility] - Night Physician Simonne Martinet - MD Contact Type Call Who Is Calling Patient / Member / Family / Caregiver Call Type Triage / Clinical Relationship To Patient Self Return Phone Number [phone] (Primary) Chief Complaint Medical Device, Procedure and Surgery Questions (non symptomatic) Reason for Call Symptomatic / Request for [place] states that she is needing a case worker or counselor. Her husband had a stroke and she is needing assistance with overhelming circumstances relating to her husband. She is having a lot of financial troubles and needs to speak with someone about this.

## 2017-06-22 NOTE — Telephone Encounter (Signed)
Returned call to patient. Patient reports symptoms of motion sickness-patient describes as "general blah". States she has had this before in the past and has used Bonine and will try this again at home. She will follow-up if no improvement.   Regarding her initial reason for call, she states she has put herself "on hold" for the last few years while taking care of her husband and she is feeling overwhelmed with these circumstances. She is requesting therapist and also possibly case management to connect her to community resources. I provided her with contact information for Dr. Irven Shelling and she will call to inquire about insurance coverage and scheduling.  She is also a candidate for [facility] Care Management and is interested in these services as well. Verbal order received for [facility] referral from Dr. Sherren Mocha in PCP's absence. Referral placed as directed.

## 2017-06-23 ENCOUNTER — Other Ambulatory Visit: Payer: Self-pay | Admitting: *Deleted

## 2017-06-23 NOTE — Addendum Note (Signed)
Addended by: Oswaldo Done on: 04/20/8784 04:03 PM   Modules accepted: Level of Service, SmartSet

## 2017-06-23 NOTE — Progress Notes (Signed)
This encounter was created in error - please disregard.

## 2017-06-23 NOTE — Patient Outreach (Signed)
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   Successful outreach call to patient,HIPAA information verified , explained reason for the call and [facility] care management services.  Patient discussed being overwhelmed with caring for her husband that she has put her self and medical problems on hold.   Medical Conditions Diabetes- has not checked her blood sugar in the last week, noted Alc 12.3 on 11/22/16.  Hypertension - not monitoring blood pressure at home.  Arthritis - Patient reports that limits her activity at times and with caring for her husband. Anxiety/Depression - reports using buspar as needed not on a daily basis.    Social Patient lives at home with her husband that she is the caregiver for related to recent stroke.  She has a supportive son that helps out at times . Patient reports she does drive some when she feels like and if she doesn't she will reschedule  the appointment.  Patient discussed being overwhelmed with amount of effort  with taking care of her husband , she is asked about resources to help her with coping .  Patient has a rollator that she uses at home, sometimes using seat with taking laundry from room to room. No falls in the last 3 months.   Medications Patient reports being on at least 10 medications , she denies cost concern with medications. Patient discussed she does not always take her medications as prescribed, because of side effects, she takes what she can to cope with everything. Patient discussed some of her medications make her feel tired, and she doesn't feel like doing what she needs to do. Reports being on a pill to regulate her heart beat, but she only takes as  needed . Patient not able to state name of medications that she does not take on regular basis.   Depression screen  Depression screen [facility] 2/9 06/23/2017 05/21/2014 04/25/2013 12/21/2012  Decreased Interest 1 0 0 0  Down, Depressed, Hopeless 2 0 0 0  PHQ - 2 Score 3 0 0 0  Altered sleeping 1 - - -  Tired, decreased energy 2 - - -  Change in appetite 2 - - -  Feeling bad or failure about yourself  2 - - -  Trouble concentrating 1 - - -  Moving slowly or fidgety/restless 1 - - -  Suicidal thoughts 0 - - -  PHQ-9 Score 12 - - -  Difficult doing work/chores Somewhat difficult - - -     Assessment Patient is eligible for [facility] services and has given verbal agreement.  Patient will benefit from [facility] care coordinator, for home visit, for education and self care management of Diabetes. 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 Patient will benefit from pharmacy for medication review and concerns related to medication adherence.      Plan RNCM will  place Referral to [facility] care coordinator for Diabetes self care management. Will place referral to LCSW for community resources, support with coping with being a caregiver.  RN will place pharmacy referral related to medication adherence.  Will send message to  [place] regarding active case status.   Joylene Draft, RN, [place] Management Coordinator  [place]

## 2017-06-23 NOTE — Telephone Encounter (Signed)
[facility] reached out to patient today.

## 2017-06-24 ENCOUNTER — Other Ambulatory Visit: Payer: Self-pay | Admitting: *Deleted

## 2017-06-24 NOTE — Patient Outreach (Signed)
[facility]) Care Management  06/24/2017  Leslie Benton 09-30-1940 [id number]   Referral received requesting to assist member with management of diabetes (last A1C >12).  Per chart, she also has history of hypertension, osteoarthritis, hyperlipidemia and depression.  Call placed to member to schedule home visit, no answer.  HIPAA compliant voice message left, will await call back.  If no call back will follow up next week.  Valente David, [place], MSN [place] [phone]

## 2017-06-24 NOTE — Patient Outreach (Signed)
[facility]) Care Management  06/24/2017  Leslie Benton 04-12-1941 [id number]   CSW made an initial attempt to try and contact patient today to perform phone assessment, as well as assess and assist with social needs and services, without success.  A HIPAA compliant message was left for patient on voicemail.  CSW is currently awaiting a return call. CSW will make a second outreach attempt on Monday, June 27, 2017, if CSW does not receive a return call from patient in the meantime. Nat Christen, BSW, MSW, LCSW  Licensed Education officer, environmental Health System  Mailing [place] [address], [place], [place] [address] Physical Address-[address], [place], [place] [address] Toll Free Main # [phone] Fax # [phone] Cell # [phone]  Office # [phone] [email or web address]

## 2017-06-27 ENCOUNTER — Other Ambulatory Visit: Payer: Self-pay | Admitting: *Deleted

## 2017-06-27 ENCOUNTER — Encounter: Payer: Self-pay | Admitting: *Deleted

## 2017-06-27 NOTE — Patient Outreach (Signed)
[facility]) Care Management  06/27/2017  Leslie Benton 01/11/1941 [id number]  CSW was able to make initial contact with patient today to perform phone assessment, as well as assess and assist with social work needs and services.  CSW introduced self, explained role and types of services provided through [place] [facility] ([place] [facility]).  CSW further explained to patient that CSW works with patient's RNCM, also with [place] Management, Leslie Benton. CSW then explained the reason for the call, indicating that [name] thought that patient would benefit from social work services and resources to assist with obtaining a list of home health agencies for in-home care services and a list of agencies that offer counseling and supportive services, through group therapy, as well as individual counseling services.  CSW obtained two HIPAA compliant identifiers from patient, which included patient's name and date of birth. Patient reported that she is currently using [facility] to assist with in-home care needs and services for her husband.  However, patient admitted that she is in the process of interviewing other agencies, as she plans to increase the number of caregiver hours her husband receives each day.  Patient indicated that she currently has a caregiver from 11:00 PM until 7:00 AM and then another caregiver from 11:00 AM until 5:00 PM, Monday through Friday.  Patient would like for patient's caregiver services to be increased over the weekend.  CSW directed patient to the following website to receive a list of licensed and bonded home health agencies (pixomage.com).  Patient did not think that she would need the information bu agreed to take it down, just in case. CSW also spoke with patient about caregiver support groups, as well as individual counseling services.  CSW provided patient with the following list of agencies: [facility] - #  [phone] [facility] - # [phone] [facility] - # [phone] [facility] - # [facility] - # [place] - # [phone] CSW also agreed to fax the list of agencies to patient's home, along with a complete list of counselors and therapists that offer individual counseling services.  CSW was able to ensure that patient has the correct contact number of CSW, encouraging patient to contact CSW directly if additional social work needs arise in the near future.  Patient voiced understanding and was agreeable to this plan. CSW will perform a case closure on patient, as all goals of treatment have been met from social work standpoint and no additional social work needs have been identified at this time.  CSW will notify patient's RNCM with [place] Management, Leslie Benton of CSW's plans to close patient's case.  CSW will fax an update to patient's Primary Care Physician, Leslie Benton to ensure that they are aware of CSW's involvement with patient's plan of care.  CSW will submit a case closure request to Leslie Benton, Care Management Assistant with [place] Management, in the form of an [employer].  CSW will ensure that Leslie Benton is aware of Leslie Benton's, RNCM with [place] Management, continued involvement with patient's care. Leslie Benton, BSW, MSW, LCSW  Licensed Education officer, environmental Health System  Mailing [place] [address] [place], [place] [address] Physical Address-[address], [place], [place] [address] Toll Free Main # [phone] Fax # [phone] Cell #  [phone]  Office # [phone] [email or web address]

## 2017-06-27 NOTE — Patient Outreach (Signed)
[facility] [facility]) Care Management  06/27/2017  Leslie Benton 1940/12/11 [id number]   2nd attempt made to contact member regarding [facility] involvement.  Identity verified.  This care manager introduced self and purpose of call.  [facility] care management services explained.  She immediately begins to discuss concerns regarding her husband's condition and his caregivers.  Report she is not happy with services currently receiving and is looking for new agency and additional help.  Noted that LCSW discussed options with member today regarding in home care services.  This care manager attempted to redirect conversation back to member's own health condition, however she continues to discuss her husband's instead of her own.  State she feel she is "ok."  Attempted to schedule home visit for assessment, but was interrupted when a visit arrived at the home.  Member asked this care manager to call back.  Will follow up within the next 2 days and attempt to schedule home visit for next week.  Valente David, [place], MSN Soph[place] [phone]

## 2017-06-28 ENCOUNTER — Other Ambulatory Visit: Payer: Self-pay | Admitting: *Deleted

## 2017-06-28 NOTE — Patient Outreach (Signed)
[facility]) Care Management  06/28/2017  Leslie Benton 1940/05/23 [id number]   Call placed back to member per her request yesterday.  She again begins to discuss needs for husband.  She is advised that she was contacted yesterday by LCSW and provided with community resources.  She state she has not received anything.  This care manager will contact LCSW to confirm information was sent to the home.  Member remains adamant regarding not needing assistance to manager her own health conditions, state "I thought I told somebody yesterday that I didn't need your help."  Advised that although she did state that yesterday, she also requested a call back.  She is appreciative of the call back but would only like information from LCSW.  Advised to contact LCSW (contact information provided) by next week if information not received.  She verbalizes understanding.    Will notify care management assistant and primary MD of case closure.  Valente David, [place], MSN [place] [phone]

## 2017-06-29 ENCOUNTER — Other Ambulatory Visit: Payer: Self-pay | Admitting: *Deleted

## 2017-06-29 ENCOUNTER — Telehealth: Payer: Self-pay | Admitting: Pharmacist

## 2017-06-29 NOTE — Telephone Encounter (Signed)
-----   Message from [place], [place] sent at 06/28/2017  8:40 PM EDT ----- Regarding: Case Closure Renne Crigler,  I took you off the care team for Mrs. Philippi.  I have talked to her twice and she was adamant both times that she didn't need our services.  She is only interested in LCSW involvement to help with resources for her husband, which Di Kindle has already reached out to her for.  I am closing.  She has Joanna's number in case she need anything else from her.  [employer]

## 2017-06-29 NOTE — Patient Outreach (Signed)
[facility]) Care Management  06/29/2017  RAYNEISHA BOUZA [birth year] [id number]    CSW was able to make contact with patient today to follow-up regarding the list of resources CSW provided to patient earlier in the week.  CSW was also able to ensure that patient received the faxed information that was sent on Monday, June 27, 2017.  Patient confirmed that she has the list of resources that she has written down, as well as the copy that was faxed to her.  CSW inquired as to whether or not patient has any questions about any of the information provided to her, but patient denied.  Patient reported that she is still interviewing agencies with regards to additional in-home care service hours for her husband, Yanelie Abraha.  CSW asked patient if patient would like for CSW to contact her son, Ladana Chavero to provide him with the list of resources, as well.  Patient admitted that she did not feel that this would be necessary.  CSW was able to ensure that patient has the correct contact information for CSW, encouraging patient to contact CSW directly if additional social work needs arise in the near future.  Patient voiced understanding and was agreeable to this plan. Nat Christen, BSW, MSW, LCSW  Licensed Education officer, environmental Health System  Mailing [place] [address] [employer], [place] [address] Physical Address-[address], [place], [place] [address] Toll Free Main # [phone] Fax # [phone] Cell # [phone]  Office # [phone] [email or web address]

## 2017-06-29 NOTE — Patient Outreach (Signed)
[place] [facility]) Care Management  06/29/2017  Leslie Benton 04/29/1940 [id number]   A referral was received for the patient but before she could be called, a message was received from [place], Valente David stating the patient declined [place] and Pharmacy services.  As such, the patient was not called and a pharmacy episode was not started for the patient.  Elayne Guerin, PharmD, [place] Clinical Pharmacist [phone]

## 2017-06-30 ENCOUNTER — Telehealth: Payer: Self-pay | Admitting: Family Medicine

## 2017-06-30 NOTE — Telephone Encounter (Signed)
I called and Sophia, she wouldn't give me her title or relationship to the patient answered the phone. I She stated that Ms.Hunke was in the other room. She put me on hold and I heard her knock on a door and say "Ms.Leslie Benton". No one answered and then Sophia got back on the phone and said "she's not here call her back tomorrow". I asked her if she would be in soon she said no. I then asked her well who were you calling for in the background she responded again "call her back tomorrow". I hung up there phone and called back to get Sophia's name. Will call back tomorrow.

## 2017-06-30 NOTE — Telephone Encounter (Signed)
Copied from [place]. Topic: Inquiry >> Jun 30, 2017 12:51 PM Percell Belt A wrote: Pt called in and stated that she has nurse coming out and she does not like her and does not want her to come back.  She has issues with her and wants a different nurse to come out.  She cant sleep and she is worried and upset.  She wants Dr Raliegh Ip to know that this is a problem for and causing her so much stress that is making her so upset.  She wants a new home health nurse ASAP.  She would like nurse to call her and talk about this issue she is having.  This Home health nurse is not for her.  This home health is for her husband just fyi  Best number - [phone]

## 2017-07-01 ENCOUNTER — Telehealth: Payer: Self-pay | Admitting: Internal Medicine

## 2017-07-01 NOTE — Telephone Encounter (Signed)
Noted! will route to all of the stated names.

## 2017-07-01 NOTE — Telephone Encounter (Signed)
Sarah called back and wanted to speak with Dr.Kwiatkowski, Patie Jaimes or Leandra Kern. However, I will help out as much as possible.

## 2017-07-01 NOTE — Telephone Encounter (Signed)
Copied from [place]. Topic: Inquiry >> Jul 01, 2017  9:50 AM Leslie Benton wrote: Reason for CRM: pt was provided [place] [facility] paperwork and it doesn't pertain to her needs.  Pt states she does not need pharmacy assitance or assitance with her medications. Pt states that she needs a Education officer, museum to speak with and there is no information in the [facility] paperwork with a contact number for social worker or psychologist. She has emotional needs that have taken over the trying situation with her husbands stroke. She said someone had mentioned to her about Dr. Gaynell Face? She thinks Carolyn/Patty told her about that. She needs to speak to someone who has knowledge of her legal rights (not wanting expense of a lawyer) in regards to a therapist. Pt is requesting call from Patty/Carolyn/Dr. Raliegh Ip.

## 2017-07-04 NOTE — Telephone Encounter (Signed)
See 06/20/17 phone note. We referred patient to [facility] to address her needs, and also provided contact information for [facility] and encouraged her to contact them to schedule an appointment.  Since then, she has been contacted with [facility] and per chart review has spoken with a Education officer, museum x3. I will call patient and provide contact info for [facility] again as well as the contact info for her social worker with [facility].

## 2017-07-04 NOTE — Telephone Encounter (Signed)
Called patient and left message to return call.  When patient returns call, please provide her with information below.   Social Web designer Info: Nat Christen, [place], MSW, [employer]  Licensed Education officer, environmental Health System  Mailing [place] [address] [place], [place] [address] [place] Main # [phone] Fax # [phone] Office # [phone] [email or web address]  From 06/27/17 Patient Outreach call w/ social worker:  CSW also spoke with patient about caregiver support groups, as well as individual counseling services.  CSW provided patient with the following list of agencies: [facility] - # [phone] [facility] - # [phone] [facility] - # [phone] [facility] - # [place] - # [place] [phone]  From 06/20/17 telephone call with me:  [place] - [phone]

## 2017-07-05 ENCOUNTER — Telehealth: Payer: Self-pay | Admitting: Internal Medicine

## 2017-07-05 NOTE — Telephone Encounter (Signed)
Pt requesting 90 day supply of Phentermine 37.5mg  capsule and states it is cheaper to get a 90 day supply.  Dr. Myra Gianotti [facility]

## 2017-07-05 NOTE — Telephone Encounter (Signed)
Copied from [place]. Topic: Quick Communication - Rx Refill/Question >> Jul 05, 2017  2:43 PM Carolyn Stare wrote: Medication    phentermine 37.5 MG capsule     pt said she want to get 90 pills it is cheaper     Preferred [place]: Please be advised that RX refills may take up to 3 business days. We ask that you follow-up with your pharmacy.

## 2017-07-05 NOTE — Telephone Encounter (Signed)
Leslie Benton. Thank you for your assistance with this patient.  Pt notified of information below and verbalized understanding.  She does not recall receiving this information from [place] before, although chart notes indicate that patient confirmed receipt. She also notes that it is hard for her to keep up with details and information right now due to lack of sleep from being stressed about her husband's care. She does remember talking to [employer] and states she did call her back and leave a message, but that she is out of the office until later this month. I assured her that Di Kindle would call back once she received the message.  She also wished to discuss her legal rights to her husband's health information. She stated she has called his home health agency multiple times to ask questions about his health and has been told that she is not privy to that information and has been asked to stop calling. I briefly explained HIPAA privacy laws to the patient and told her that we and other health organizations cannot legally disclose protected health information to anyone besides the patient themselves unless the patient has given written consent to disclose information to someone else. Leslie Benton verbalized understanding of this information and was appreciative of the call.   She also reports that she did call [place] as previously advised, but states the soonest appointment was too far out, so she opted not to schedule at this time. She will plan to start calling some of the resources listed below to discuss their offerings for caregiver support groups/counseling services.

## 2017-07-06 MED ORDER — PHENTERMINE HCL 37.5 MG PO CAPS: 37.5000 mg | ORAL_CAPSULE | Freq: Every morning | ORAL | 0 refills | Status: DC

## 2017-07-06 NOTE — Telephone Encounter (Signed)
Pt is calling to let us know that she requested this medication several days ago.  She would like this filled

## 2017-07-06 NOTE — Telephone Encounter (Signed)
Spoke to patient and informed her that her medication will be sent in electronically.

## 2017-07-06 NOTE — Telephone Encounter (Signed)
Patient is Highly upset with husband caregiver. Patient will like a new company for her husband.

## 2017-07-07 ENCOUNTER — Telehealth: Payer: Self-pay

## 2017-07-07 ENCOUNTER — Other Ambulatory Visit: Payer: Self-pay | Admitting: Internal Medicine

## 2017-07-07 NOTE — Telephone Encounter (Signed)
Spoke with patient and she stated that someone has told her already that a 90 day prescription will be sent in in the past so she doesn't know why the medication is not 90. After reviewing the patient's chart, I agree. I informed patient that medication refill will be changed. I called pharmacy and the pharmacist is on break. Will try again in 30 minutes. Patient informed of the update.

## 2017-07-07 NOTE — Telephone Encounter (Signed)
Patient is calling back because she has not heard from anyone. Informed patient I would send another message.

## 2017-07-07 NOTE — Telephone Encounter (Signed)
Patient is calling and states she would like [place] or Hoyle Sauer to contact her in regards to her medication. She states Phentermine 37.5mg  capsule was supposed to be called in for 90 supply instead of 30. Please advise.  [phone]

## 2017-07-07 NOTE — Telephone Encounter (Signed)
Spoke to Leslie Benton she stated that someone has already stated in the past that her medication will be switched to a 90 day supply per Dr.Kwiatkowski. After reviewing pt chart I agree. Sending in 90 day. Will inform patient of update. No further action needed.

## 2017-07-12 NOTE — Telephone Encounter (Signed)
Called patient for update regarding [facility] nurse situation.

## 2017-07-20 ENCOUNTER — Telehealth: Payer: Self-pay | Admitting: Internal Medicine

## 2017-07-20 NOTE — Telephone Encounter (Signed)
Have patient try Monistat 7 which may be purchased OTC

## 2017-07-20 NOTE — Telephone Encounter (Signed)
Copied from [place] [id number]. Topic: Quick Communication - See Telephone Encounter >> Jul 20, 2017 10:56 AM Conception Chancy, NT wrote: CRM for notification. See Telephone encounter for: 07/20/17.  Patient is calling and requesting a prescription for vaginal itching. She states that she does not remember the name of it. Please advise.  [facility] [place] [address] AT [facility] [address] [place] [address] Phone: [phone] Fax: [phone]

## 2017-07-21 NOTE — Telephone Encounter (Signed)
Spoke to [facility] nurse and she stated that the patient is sleeping. I left a message with Eda Paschal to inform Ms.Ware to try OTC monistat cream for relief.

## 2017-07-25 NOTE — Progress Notes (Deleted)
Subjective:   Leslie Benton is a 77 y.o. female who presents for Medicare Annual/Subsequent preventive examination.  Reports DM 2   Diet Chol chol 282; hdl 54; trig 307 Diet 11/2016 12.3    Exercise   Health Maintenance Due  Topic Date Due  . DEXA SCAN  04/17/2004  . OPHTHALMOLOGY EXAM  05/03/2015  . HEMOGLOBIN A1C  05/25/2017   Diabetic 04/2014 Colonoscopy 08/2016        Objective:    Vitals: There were no vitals taken for this visit.  There is no height or weight on file to calculate BMI.  Advanced Directives 06/27/2017 06/23/2017 06/23/2017 07/01/2015 05/29/2015 09/23/2014 09/23/2014  Does Patient Have a Medical Advance Directive? Yes - Yes Yes Yes Yes Yes  Type of Paramedic of [place];Living will (No Data) Living will - [place];Living will Living will [place];Living will  Does patient want to make changes to medical advance directive? No - Patient declined - No - Patient declined - - No - Patient declined -  Copy of [place] in Chart? No - copy requested - - No - copy requested - - -  Pre-existing out of facility DNR order (yellow form or pink MOST form) - - - - - - -    Tobacco Social History   Tobacco Use  Smoking Status Never Smoker  Smokeless Tobacco Never Used  Tobacco Comment   Never Used Tobacco     Counseling given: Not Answered Comment: Never Used Tobacco   Clinical Intake:     Past Medical History:  Diagnosis Date  . Anemia   . Anxiety   . Cervical spondylosis without myelopathy 05/29/2015  . COLONIC POLYPS, HX OF 02/07/2008  . DEGENERATIVE JOINT DISEASE 10/14/2006   03/31/11: Pt states arthritis in her hip.  . Depression   . DIABETES MELLITUS, TYPE II 10/27/2009  . GERD 10/14/2006  . HYPERLIPIDEMIA 10/14/2006  . HYPERTENSION 10/14/2006   off bp meds now  . HYPOTHYROIDISM 10/14/2006  . LOW BACK PAIN 08/04/2007  . Obesity    Past Surgical History:  Procedure  Laterality Date  . HEMORRHOID SURGERY    . Left knee arthroscopic  April 2014   Dr. Durward Fortes  . PARTIAL KNEE ARTHROPLASTY Left 09/23/2014   Procedure: LEFT UNICOMPARTMENTAL KNEE;  Surgeon: [name], MD;  Location: [facility];  Service: Orthopedics;  Laterality: Left;  . TONSILLECTOMY    . TUBAL LIGATION     Family History  Problem Relation Age of Onset  . Heart attack Father 59  . Kidney failure Mother   . Cancer Sister        endometrial  . Cancer Brother        Adrenal   Social History   Socioeconomic History  . Marital status: Married    Spouse name: Not on file  . Number of children: 2  . Years of education: college  . Highest education level: Not on file  Occupational History  . Occupation: retired  Scientific laboratory technician  . Financial resource strain: Not on file  . Food insecurity:    Worry: Not on file    Inability: Not on file  . Transportation needs:    Medical: Not on file    Non-medical: Not on file  Tobacco Use  . Smoking status: Never Smoker  . Smokeless tobacco: Never Used  . Tobacco comment: Never Used Tobacco  Substance and Sexual Activity  . Alcohol use: No  Alcohol/week: 0.0 oz  . Drug use: No  . Sexual activity: Not on file  Lifestyle  . Physical activity:    Days per week: Not on file    Minutes per session: Not on file  . Stress: Not on file  Relationships  . Social connections:    Talks on phone: Not on file    Gets together: Not on file    Attends religious service: Not on file    Active member of club or organization: Not on file    Attends meetings of clubs or organizations: Not on file    Relationship status: Not on file  Other Topics Concern  . Not on file  Social History Narrative   Lives with husband and son.     Patient drinks 1-2 cups of caffeine daily.   Patient is left handed.     Outpatient Encounter Medications as of 07/26/2017  Medication Sig  . acetaminophen (TYLENOL) 500 MG tablet Take 500 mg by mouth every 6 (six) hours  as needed for moderate pain or headache.  . Blood Glucose Monitoring Suppl (ACCU-CHEK AVIVA PLUS) W/DEVICE KIT USE AS DIRECTED TO CHECK BLOOD SUGARS  . busPIRone (BUSPAR) 15 MG tablet Take 1 tablet (15 mg total) by mouth daily.  Marland Kitchen glipiZIDE (GLUCOTROL XL) 5 MG 24 hr tablet Take 1 tablet (5 mg total) by mouth daily with breakfast.  . glucose blood (ACCU-CHEK AVIVA) test strip USE TO CHECK BLOOD SUGAR DAILY AND PRN  . ibuprofen (ADVIL,MOTRIN) 200 MG tablet Take 400 mg by mouth as needed.  . Lancets (ACCU-CHEK MULTICLIX) lancets USE TO CHECK BLOOD SUGAR DAILY AND PRN  . levothyroxine (SYNTHROID, LEVOTHROID) 150 MCG tablet take 1 tablet by mouth every morning ON AN EMPTY STOMACH BEFORE BREAKFAST  . lisinopril (PRINIVIL,ZESTRIL) 20 MG tablet Take 1 tablet (20 mg total) by mouth daily.  Marland Kitchen loratadine (CLARITIN) 10 MG tablet Take 10 mg by mouth daily.  . metFORMIN (GLUCOPHAGE) 1000 MG tablet Take 1 tablet (1,000 mg total) by mouth 2 (two) times daily with a meal.  . Multiple Vitamins-Minerals (MULTI-VITAMIN GUMMIES PO) Take 2 each by mouth daily.  . phentermine 37.5 MG capsule Take 1 capsule (37.5 mg total) by mouth every morning.  . pioglitazone (ACTOS) 30 MG tablet Take 1 tablet (30 mg total) by mouth daily.  . propranolol (INDERAL) 20 MG tablet Take 1 tablet (20 mg total) by mouth 2 (two) times daily.  . simvastatin (ZOCOR) 20 MG tablet Take 1 tablet (20 mg total) by mouth at bedtime.   No facility-administered encounter medications on file as of 07/26/2017.     Activities of Daily Living In your present state of health, do you have any difficulty performing the following activities: 06/27/2017  Hearing? N  Vision? N  Difficulty concentrating or making decisions? N  Walking or climbing stairs? Y  Dressing or bathing? N  Doing errands, shopping? N  Preparing Food and eating ? N  Using the Toilet? N  In the past six months, have you accidently leaked urine? N  Do you have problems with loss of  bowel control? N  Managing your Medications? N  Managing your Finances? Y  Housekeeping or managing your Housekeeping? Y  Some recent data might be hidden    Patient Care Team: Marletta Lor, MD as PCP - Geoffry Paradise, MD as Consulting Physician (Physical Medicine and Rehabilitation)   Assessment:   This is a routine wellness examination for Leslie Benton.  Exercise Activities and  Dietary recommendations    Goals    None      Fall Risk Fall Risk  06/27/2017 06/23/2017 07/01/2015 08/05/2014 07/15/2014  Falls in the past year? No No No No No  Risk for fall due to : Impaired balance/gait Impaired balance/gait - - -  Risk for fall due to: Comment - uses rollator at home  - - -    Depression Screen PHQ 2/9 Scores 06/27/2017 06/23/2017 05/21/2014 04/25/2013  PHQ - 2 Score 3 3 0 0  PHQ- 9 Score 12 12 - -    Cognitive Function        Immunization History  Administered Date(s) Administered  . Influenza Split 01/06/2012  . Influenza Whole 12/28/2010  . Influenza, High Dose Seasonal PF 01/14/2015, 05/20/2017  . Influenza,inj,Quad PF,6+ Mos 12/21/2012, 02/11/2014  . PPD Test 09/25/2014  . Pneumococcal Conjugate-13 03/22/2013  . Pneumococcal Polysaccharide-23 12/28/2010  . Tdap 12/28/2010  . Zoster 12/28/2010      Screening Tests Health Maintenance  Topic Date Due  . DEXA SCAN  04/17/2004  . OPHTHALMOLOGY EXAM  05/03/2015  . HEMOGLOBIN A1C  05/25/2017  . FOOT EXAM  07/30/2017  . INFLUENZA VACCINE  11/17/2017  . TETANUS/TDAP  12/27/2020  . PNA vac Low Risk Adult  Completed         Plan:      PCP Notes ***  Health Maintenance ***  Abnormal Screens  ***  Referrals  ***  Patient concerns; ***  Nurse Concerns; ***  Next PCP apt ***      I have personally reviewed and noted the following in the patient's chart:   . Medical and social history . Use of alcohol, tobacco or illicit drugs  . Current medications and supplements . Functional  ability and status . Nutritional status . Physical activity . Advanced directives . List of other physicians . Hospitalizations, surgeries, and ER visits in previous 12 months . Vitals . Screenings to include cognitive, depression, and falls . Referrals and appointments  In addition, I have reviewed and discussed with patient certain preventive protocols, quality metrics, and best practice recommendations. A written personalized care plan for preventive services as well as general preventive health recommendations were provided to patient.     Wynetta Fines, RN  07/25/2017

## 2017-07-26 ENCOUNTER — Ambulatory Visit: Payer: Medicare Other

## 2017-07-26 ENCOUNTER — Encounter: Payer: Self-pay | Admitting: Family Medicine

## 2017-07-26 ENCOUNTER — Ambulatory Visit (INDEPENDENT_AMBULATORY_CARE_PROVIDER_SITE_OTHER): Payer: Medicare Other | Admitting: Family Medicine

## 2017-07-26 VITALS — BP 146/100 | HR 92 | Temp 98.4°F | Wt 191.0 lb

## 2017-07-26 DIAGNOSIS — E119 Type 2 diabetes mellitus without complications: Secondary | ICD-10-CM

## 2017-07-26 DIAGNOSIS — B369 Superficial mycosis, unspecified: Secondary | ICD-10-CM

## 2017-07-26 DIAGNOSIS — E039 Hypothyroidism, unspecified: Secondary | ICD-10-CM | POA: Diagnosis not present

## 2017-07-26 DIAGNOSIS — H60542 Acute eczematoid otitis externa, left ear: Secondary | ICD-10-CM

## 2017-07-26 DIAGNOSIS — D500 Iron deficiency anemia secondary to blood loss (chronic): Secondary | ICD-10-CM

## 2017-07-26 MED ORDER — TERCONAZOLE 0.8 % VA CREA: TOPICAL_CREAM | VAGINAL | 0 refills | Status: DC

## 2017-07-26 MED ORDER — FLUOCINONIDE 0.05 % EX SOLN: CUTANEOUS | 0 refills | Status: DC

## 2017-07-26 NOTE — Progress Notes (Signed)
Messenger is a 77 year old married female who comes in unaccompanied today for evaluation of 2 problems  The first problem is some itching in her left ear. This is been present for about 7-10 days. No history of trauma no fever etc. Or hearing loss  She's also has a rash in her groin. She's tried over-the-counter medicine hasn't helped. Somebody in her family is a Software engineer that told her she needed to take an antifungal pill.  She has numerous medical problems and has numerous medications for which taking oral antifungals which not be indicated.  BP (!) 146/100 (BP Location: Left Arm, Patient Position: Sitting, Cuff Size: Normal)   Pulse 92   Temp 98.4 F (36.9 C) (Oral)   Wt 191 lb (86.6 kg)   SpO2 96%   BMI 33.83 kg/m  Very pale week appearing female. Izora Gala not had to help her get up on the table. She states she drove here by herself.  Not accompanied by any family members  Examination of the ears was normal. She does have a slight irritation the ear canal however no evidence of any infection. Appears to be more dryness than anything else  She has a quarter size lesion on her right upper groin area just lateral to the labia with mild erythema.  #1 irritation left ear canal........ Steroid liquid drops  #2/ quarter  size area in the groin consistent with a mild fungal infection...........Marland Kitchenwill give her a prescription antifungal.  #3 diabetes with a history of anemia and noncompliance............... Check labs follow-up by Dr. Raliegh Ip next Monday

## 2017-07-26 NOTE — Patient Instructions (Signed)
Lidex ear drops.............. One drop in your left ear canal bedtime for 10 nights  Terazol cream.............. Small amounts twice daily until rash is completely gone  Labs today  Make an appointment to see Dr. Raliegh Ip next Monday for follow-up

## 2017-07-27 LAB — BASIC METABOLIC PANEL
BUN: 20 mg/dL (ref 6–23)
CALCIUM: 8.8 mg/dL (ref 8.4–10.5)
CO2: 26 meq/L (ref 19–32)
CREATININE: 1.08 mg/dL (ref 0.40–1.20)
Chloride: 100 mEq/L (ref 96–112)
GFR: 52.11 mL/min — AB (ref >60.00)
Glucose, Bld: 356 mg/dL — ABNORMAL HIGH (ref 70–99)
Potassium: 4.2 mEq/L (ref 3.5–5.1)
Sodium: 136 mEq/L (ref 135–145)

## 2017-07-27 LAB — CBC WITH DIFFERENTIAL/PLATELET
BASOS PCT: 1.1 % (ref 0.0–3.0)
Basophils Absolute: 0.1 10*3/uL (ref 0.0–0.1)
EOS PCT: 0.4 % (ref 0.0–5.0)
Eosinophils Absolute: 0 10*3/uL (ref 0.0–0.7)
HCT: 41.8 % (ref 36.0–46.0)
Hemoglobin: 13.9 g/dL (ref 12.0–15.0)
LYMPHS ABS: 1.6 10*3/uL (ref 0.7–4.0)
Lymphocytes Relative: 29.1 % (ref 12.0–46.0)
MCHC: 33.3 g/dL (ref 30.0–36.0)
MCV: 87.2 fl (ref 78.0–100.0)
MONO ABS: 0.5 10*3/uL (ref 0.1–1.0)
MONOS PCT: 8.5 % (ref 3.0–12.0)
NEUTROS PCT: 60.9 % (ref 43.0–77.0)
Neutro Abs: 3.3 10*3/uL (ref 1.4–7.7)
PLATELETS: 289 10*3/uL (ref 150.0–400.0)
RBC: 4.8 Mil/uL (ref 3.87–5.11)
RDW: 14.9 % (ref 11.5–15.5)
WBC: 5.4 10*3/uL (ref 4.0–10.5)

## 2017-07-27 LAB — HEMOGLOBIN A1C: HEMOGLOBIN A1C: 12.2 % — AB (ref 4.6–6.5)

## 2017-07-27 LAB — TSH: TSH: 6.14 u[IU]/mL — AB (ref 0.35–4.50)

## 2017-07-28 ENCOUNTER — Other Ambulatory Visit: Payer: Self-pay | Admitting: Family Medicine

## 2017-08-02 ENCOUNTER — Ambulatory Visit: Payer: Medicare Other | Admitting: Internal Medicine

## 2017-08-03 ENCOUNTER — Other Ambulatory Visit: Payer: Self-pay

## 2017-08-03 DIAGNOSIS — E039 Hypothyroidism, unspecified: Secondary | ICD-10-CM

## 2017-08-03 DIAGNOSIS — E119 Type 2 diabetes mellitus without complications: Secondary | ICD-10-CM

## 2017-08-03 NOTE — Progress Notes (Signed)
Patient did say that she is taking her thyroid medication everyday but could not take them prior lab work due to stress and a busy life.

## 2017-08-08 ENCOUNTER — Ambulatory Visit: Payer: Medicare Other | Admitting: Internal Medicine

## 2017-08-08 ENCOUNTER — Telehealth: Payer: Self-pay | Admitting: Internal Medicine

## 2017-08-08 NOTE — Telephone Encounter (Signed)
Copied from [place] [id number]. Topic: Quick Communication - See Telephone Encounter >> Aug 08, 2017 10:08 AM Cleaster Corin, NT wrote: CRM for notification. See Telephone encounter for: 08/08/17.  Pt. Calling to see if she can have a list of current medications and a list of endocrinologists mailed (pt. Would like for kendra to give her a callback)  [place] [phone] (H)

## 2017-08-09 NOTE — Telephone Encounter (Signed)
Left message to return call 

## 2017-08-10 NOTE — Telephone Encounter (Signed)
Pt returning call. Pt wants the info listed below mailed to her.  Ok to call at [phone]. Pt is unable to retrieve messages on her phone.

## 2017-08-11 NOTE — Telephone Encounter (Signed)
Medication list sent to patient. Informed patient endo referral was placed. Patient does not want to see Dr.Buccini due to the high traffic in the office area. Patient was informed that I will let referral aware of the information.

## 2017-08-15 ENCOUNTER — Telehealth: Payer: Self-pay | Admitting: Family Medicine

## 2017-08-15 NOTE — Telephone Encounter (Signed)
Copied from [place] [phone]. Topic: General - Other >> Aug 15, 2017  9:11 AM Aurelio Brash B wrote: Reason for CRM: PT is asking for a list of her medications be mailed to her home address

## 2017-08-15 NOTE — Telephone Encounter (Signed)
Left message to return call. Medication list was mailed 08/10/2017.

## 2017-08-16 ENCOUNTER — Ambulatory Visit: Payer: Medicare Other | Admitting: Internal Medicine

## 2017-09-11 ENCOUNTER — Other Ambulatory Visit: Payer: Self-pay | Admitting: Internal Medicine

## 2017-09-13 ENCOUNTER — Telehealth: Payer: Self-pay

## 2017-09-13 DIAGNOSIS — Z789 Other specified health status: Secondary | ICD-10-CM

## 2017-09-13 MED ORDER — PHENTERMINE HCL 37.5 MG PO CAPS: 37.5000 mg | ORAL_CAPSULE | Freq: Every morning | ORAL | 0 refills | Status: DC

## 2017-09-13 MED ORDER — GLIPIZIDE ER 5 MG PO TB24: 5.0000 mg | ORAL_TABLET | Freq: Every day | ORAL | 4 refills | Status: DC

## 2017-09-13 MED ORDER — SIMVASTATIN 20 MG PO TABS: 20.0000 mg | ORAL_TABLET | Freq: Every day | ORAL | 3 refills | Status: DC

## 2017-09-14 NOTE — Telephone Encounter (Signed)
Leslie Benton, Patient said she would like the prescription for phentermine 37.5 MG capsule to be for 90 days instead of 30 days due to her having a hard time getting to the pharmacy. Please advise.

## 2017-09-14 NOTE — Telephone Encounter (Signed)
Patient would like to know does she still need to take  Simvastatin 20 mg Oral Daily at bedtime & glipiZIDE 5 mg Oral Daily with breakfast . Call back [phone]

## 2017-09-15 NOTE — Telephone Encounter (Signed)
Phentermine sent in for 60 tabs instead of 30 and 90 per providers request.

## 2017-09-15 NOTE — Telephone Encounter (Signed)
Simvastatin sent in as well.

## 2017-09-22 NOTE — Addendum Note (Signed)
Addended by: Gwenyth Ober R on: 09/22/2017 12:39 PM   Modules accepted: Orders

## 2017-10-03 ENCOUNTER — Other Ambulatory Visit: Payer: Self-pay

## 2017-10-03 NOTE — Patient Outreach (Signed)
[facility]) Care Management  10/03/2017  Leslie Benton [birth year] [id number]  TELEPHONE SCREENING Referral date: 09/22/17 Referral source: primary MD referral Referral reason: low social support/ high risk behaviors Insurance: Medicare Attempt #1   Telephone call to patient regarding primary MD referral. Unable to reach patient. HIPAA compliant voice message left with call back phone number.   PLAN: RNCM will attempt 2nd telephone call to patient within 4 business days. RNCM will send outreach letter.   Quinn Plowman RN,BSN,CCM [facility] Telephonic  [phone]    PLAN:

## 2017-10-06 ENCOUNTER — Other Ambulatory Visit: Payer: Self-pay

## 2017-10-06 NOTE — Patient Outreach (Signed)
[facility]) Care Management  10/06/2017  Leslie Benton 17-Apr-1941 [id number]   .TELEPHONE SCREENING Referral date: 09/22/17 Referral source: primary MD referral Referral reason: low social support/ high risk behaviors Insurance: Medicare  Telephone call to patient regarding primary MD referral. HIPAA verified with patient. Explained reason for call. Patient states she needs to think about what her specific needs are. States she would call [facility] care management if she thinks she needs services.  RNCM gave patient contact name and phone number to [facility] care management. Patient verbally agreed to having [facility] brochure mailed to her for future reference.   Plan: RNCM will close patient due to patient refusing services.  RNCM will send patient [facility] brochure/ magnet.  RNCM will send notification to patients primary MD of closure.   Quinn Plowman RN,BSN,CCM [facility] Telephonic  [phone]

## 2017-10-13 DIAGNOSIS — Z96652 Presence of left artificial knee joint: Secondary | ICD-10-CM | POA: Diagnosis not present

## 2017-10-13 DIAGNOSIS — M6281 Muscle weakness (generalized): Secondary | ICD-10-CM | POA: Diagnosis not present

## 2017-10-16 ENCOUNTER — Other Ambulatory Visit: Payer: Self-pay | Admitting: Internal Medicine

## 2017-10-17 ENCOUNTER — Encounter: Payer: Self-pay | Admitting: Internal Medicine

## 2017-10-17 ENCOUNTER — Telehealth: Payer: Self-pay | Admitting: Internal Medicine

## 2017-10-17 ENCOUNTER — Ambulatory Visit (INDEPENDENT_AMBULATORY_CARE_PROVIDER_SITE_OTHER): Payer: Medicare Other | Admitting: Internal Medicine

## 2017-10-17 ENCOUNTER — Ambulatory Visit: Payer: Medicare Other | Admitting: Internal Medicine

## 2017-10-17 VITALS — BP 140/72

## 2017-10-17 DIAGNOSIS — E785 Hyperlipidemia, unspecified: Secondary | ICD-10-CM

## 2017-10-17 DIAGNOSIS — M1711 Unilateral primary osteoarthritis, right knee: Secondary | ICD-10-CM | POA: Diagnosis not present

## 2017-10-17 DIAGNOSIS — I10 Essential (primary) hypertension: Secondary | ICD-10-CM

## 2017-10-17 DIAGNOSIS — E119 Type 2 diabetes mellitus without complications: Secondary | ICD-10-CM | POA: Diagnosis not present

## 2017-10-17 DIAGNOSIS — E1365 Other specified diabetes mellitus with hyperglycemia: Secondary | ICD-10-CM

## 2017-10-17 NOTE — Telephone Encounter (Signed)
Pt scheduled an OV for today 10/17/2017. No further action needed at the moment.

## 2017-10-17 NOTE — Progress Notes (Signed)
Subjective:    Patient ID: Leslie Benton, female    DOB: 1941/03/23, 77 y.o.   MRN: [id number]  HPI  77 year old patient who has type 2 diabetes who has not been seen in follow-up in approximately 11 months. She states that she presently is on metformin therapy only usually twice daily but she states she often forgets her afternoon dose.  She has not been using pioglitazone or SU therapy  Lab Results  Component Value Date   HGBA1C 12.2 (H) 07/26/2017    Her diabetes remains poorly control and attempts have been made for endocrinology referral.  She states that she is scared of driving especially negotiating [employer] and does not wish to impose upon her son to drive her.  She has been seen by orthopedic surgery and is participating in physical therapy for right knee pain.  No recent eye examination.  She normally is seen by Dr. Katy Fitch.   She has a very difficult home situation with a disabled husband due to a prior stroke.  She has been evaluated by patient [employer] and has been very resistant to utilize any community resources.  She has a history of obesity and is requesting a refill on phentermine which she states has been helpful to control weight.  She also states it gives her increased energy  Wt Readings from Last 3 Encounters:  07/26/17 191 lb (86.6 kg)  11/23/16 191 lb (86.6 kg)  11/22/16 186 lb 3.2 oz (84.5 kg)    Past Medical History:  Diagnosis Date  . Anemia   . Anxiety   . Cervical spondylosis without myelopathy 05/29/2015  . COLONIC POLYPS, HX OF 02/07/2008  . DEGENERATIVE JOINT DISEASE 10/14/2006   03/31/11: Pt states arthritis in her hip.  . Depression   . DIABETES MELLITUS, TYPE II 10/27/2009  . GERD 10/14/2006  . HYPERLIPIDEMIA 10/14/2006  . HYPERTENSION 10/14/2006   off bp meds now  . HYPOTHYROIDISM 10/14/2006  . LOW BACK PAIN 08/04/2007  . Obesity      Social History   Socioeconomic History  . Marital status: Married    Spouse name: Not  on file  . Number of children: 2  . Years of education: college  . Highest education level: Not on file  Occupational History  . Occupation: retired  Scientific laboratory technician  . Financial resource strain: Not on file  . Food insecurity:    Worry: Not on file    Inability: Not on file  . Transportation needs:    Medical: Not on file    Non-medical: Not on file  Tobacco Use  . Smoking status: Never Smoker  . Smokeless tobacco: Never Used  . Tobacco comment: Never Used Tobacco  Substance and Sexual Activity  . Alcohol use: No    Alcohol/week: 0.0 oz  . Drug use: No  . Sexual activity: Not on file  Lifestyle  . Physical activity:    Days per week: Not on file    Minutes per session: Not on file  . Stress: Not on file  Relationships  . Social connections:    Talks on phone: Not on file    Gets together: Not on file    Attends religious service: Not on file    Active member of club or organization: Not on file    Attends meetings of clubs or organizations: Not on file    Relationship status: Not on file  . Intimate partner violence:    Fear of current or ex  partner: Not on file    Emotionally abused: Not on file    Physically abused: Not on file    Forced sexual activity: Not on file  Other Topics Concern  . Not on file  Social History Narrative   Lives with husband and son.     Patient drinks 1-2 cups of caffeine daily.   Patient is left handed.     Past Surgical History:  Procedure Laterality Date  . HEMORRHOID SURGERY    . Left knee arthroscopic  April 2014   Dr. Durward Fortes  . PARTIAL KNEE ARTHROPLASTY Left 09/23/2014   Procedure: LEFT UNICOMPARTMENTAL KNEE;  Surgeon: [name], MD;  Location: [facility];  Service: Orthopedics;  Laterality: Left;  . TONSILLECTOMY    . TUBAL LIGATION      Family History  Problem Relation Age of Onset  . Heart attack Father 23  . Kidney failure Mother   . Cancer Sister        endometrial  . Cancer Brother        Adrenal    Allergies    Allergen Reactions  . Niacin And Related Hives and Swelling    No anaphalaxis, but strong reactions.  . Lactose Intolerance (Gi)     "bad taste in mouth"  . Amoxicillin Swelling    hives  . Penicillins Swelling    Hives With all cillins    Current Outpatient Medications on File Prior to Visit  Medication Sig Dispense Refill  . acetaminophen (TYLENOL) 500 MG tablet Take 500 mg by mouth every 6 (six) hours as needed for moderate pain or headache.    . Blood Glucose Monitoring Suppl (ACCU-CHEK AVIVA PLUS) W/DEVICE KIT USE AS DIRECTED TO CHECK BLOOD SUGARS 1 kit 0  . busPIRone (BUSPAR) 15 MG tablet Take 1 tablet (15 mg total) by mouth daily. 180 tablet 3  . fluocinonide (LIDEX) 0.05 % external solution One drop in left ear canal at bedtime for 10 days 60 mL 0  . glipiZIDE (GLUCOTROL XL) 5 MG 24 hr tablet Take 1 tablet (5 mg total) by mouth daily with breakfast. 90 tablet 4  . glucose blood (ACCU-CHEK AVIVA) test strip USE TO CHECK BLOOD SUGAR DAILY AND PRN 100 each 12  . ibuprofen (ADVIL,MOTRIN) 200 MG tablet Take 400 mg by mouth as needed.    . Lancets (ACCU-CHEK MULTICLIX) lancets USE TO CHECK BLOOD SUGAR DAILY AND PRN 100 each 4  . levothyroxine (SYNTHROID, LEVOTHROID) 150 MCG tablet TAKE 1 TABLET BY MOUTH EVERY MORNING ON AN EMPTY STOMACH 90 tablet 0  . lisinopril (PRINIVIL,ZESTRIL) 20 MG tablet Take 1 tablet (20 mg total) by mouth daily. 90 tablet 3  . loratadine (CLARITIN) 10 MG tablet Take 10 mg by mouth daily.    . metFORMIN (GLUCOPHAGE) 1000 MG tablet Take 1 tablet (1,000 mg total) by mouth 2 (two) times daily with a meal. 180 tablet 4  . Multiple Vitamins-Minerals (MULTI-VITAMIN GUMMIES PO) Take 2 each by mouth daily.    . phentermine 37.5 MG capsule Take 1 capsule (37.5 mg total) by mouth every morning. 60 capsule 0  . pioglitazone (ACTOS) 30 MG tablet Take 1 tablet (30 mg total) by mouth daily. 90 tablet 3  . propranolol (INDERAL) 20 MG tablet Take 1 tablet (20 mg total) by  mouth 2 (two) times daily. 180 tablet 3  . simvastatin (ZOCOR) 20 MG tablet Take 1 tablet (20 mg total) by mouth at bedtime. 90 tablet 3  . terconazole (TERAZOL 3) 0.8 %  vaginal cream Apply small amounts twice daily to the skin rash 20 g 0   No current facility-administered medications on file prior to visit.     BP 140/72      Review of Systems  Constitutional: Positive for activity change.  HENT: Negative for congestion, dental problem, hearing loss, rhinorrhea, sinus pressure, sore throat and tinnitus.   Eyes: Negative for pain, discharge and visual disturbance.  Respiratory: Negative for cough and shortness of breath.   Cardiovascular: Negative for chest pain, palpitations and leg swelling.  Gastrointestinal: Negative for abdominal distention, abdominal pain, blood in stool, constipation, diarrhea, nausea and vomiting.  Genitourinary: Negative for difficulty urinating, dysuria, flank pain, frequency, hematuria, pelvic pain, urgency, vaginal bleeding, vaginal discharge and vaginal pain.  Musculoskeletal: Positive for arthralgias, gait problem and joint swelling.  Skin: Negative for rash.  Neurological: Negative for dizziness, syncope, speech difficulty, weakness, numbness and headaches.  Hematological: Negative for adenopathy.  Psychiatric/Behavioral: Negative for agitation, behavioral problems and dysphoric mood. The patient is not nervous/anxious.        Objective:   Physical Exam  Constitutional: She is oriented to person, place, and time. She appears well-developed and well-nourished. No distress.  Weight 192 Blood pressure 140/70  HENT:  Head: Normocephalic.  Right Ear: External ear normal.  Left Ear: External ear normal.  Mouth/Throat: Oropharynx is clear and moist.  Eyes: Pupils are equal, round, and reactive to light. Conjunctivae and EOM are normal.  Neck: Normal range of motion. Neck supple. No thyromegaly present.  Cardiovascular: Normal rate, regular rhythm and  intact distal pulses.  Murmur heard. Grade 2/6 systolic murmur  Pulmonary/Chest: Effort normal and breath sounds normal.  Abdominal: Soft. Bowel sounds are normal. She exhibits no mass. There is no tenderness.  Musculoskeletal: Normal range of motion.  Lymphadenopathy:    She has no cervical adenopathy.  Neurological: She is alert and oriented to person, place, and time.  Skin: Skin is warm and dry. No rash noted.  Psychiatric: She has a normal mood and affect. Her behavior is normal.          Assessment & Plan:   Diabetes mellitus poor control Noncompliance  Compliance issues discussed at length.  Annual eye examination encouraged Medications updated.  Endocrinology referral encouraged  Essential hypertension  Hypothyroidism Obesity.  Phentermine refilled per patient request she was told that she must have a meaningful weight loss for this medication to be continued.  Will reassess in 3 months  Marletta Lor

## 2017-10-17 NOTE — Telephone Encounter (Signed)
Left message to return phone call.

## 2017-10-17 NOTE — Telephone Encounter (Signed)
Copied from [place] [phone]. Topic: Inquiry >> Oct 17, 2017 10:11 AM Pricilla Handler wrote: Reason for CRM: Patient called requesting a refill of Phentermine 37.5 MG capsule. Patient's preferred pharmacy is [facility] [phone] [place], [place] - [address] AT [address] [phone] (Phone)  [phone] (Fax).       Thank You!!!

## 2017-10-17 NOTE — Patient Instructions (Signed)
Please take all your medications faithfully  Endocrinology consultation as discussed  You need to lose weight.  Consider a lower calorie diet and regular exercise.  Return in 2 months for follow-up

## 2017-10-18 ENCOUNTER — Telehealth: Payer: Self-pay | Admitting: Internal Medicine

## 2017-10-18 NOTE — Telephone Encounter (Signed)
Copied from [place] [id number]. Topic: General - Other >> Oct 18, 2017  4:31 PM Cecelia Byars, NT wrote: Reason for CRM: Patient called and would like refills on the medications discussed during her visit on yesterday ,she is unable to say which medications need to be refilled please refilled .she says she called the pharmacy and was told to call the practice

## 2017-10-19 ENCOUNTER — Telehealth: Payer: Self-pay | Admitting: Internal Medicine

## 2017-10-19 NOTE — Telephone Encounter (Signed)
Okay for refill? Pt was seen yesterday but this wasn't refilled.  Please advise

## 2017-10-19 NOTE — Telephone Encounter (Signed)
Pt calling again. Asking what medications were supposed to be sent at her OV 10/17/17. Pt does not recall the names of the medications but states that Dr Raliegh Ip told her he was sending meds to the pharmacy. Verified Nothing was sent, nothing in Epic or at her pharmacy.  Pt states that she was wanting a 90-day supply of Phentermine   Dr Raliegh Ip, were there other medications that you were wanting sent to the pharmacy?

## 2017-10-19 NOTE — Telephone Encounter (Signed)
Rx request for 90 day supply of phentermine- for provider review.

## 2017-10-19 NOTE — Telephone Encounter (Unsigned)
Copied from [place] [phone]. Topic: Quick Communication - See Telephone Encounter >> Oct 19, 2017  2:55 PM Hewitt Shorts wrote: Beckey Rutter called to request a refill on phentermine 37.5 capsules  Pt is wanting a 90 day supply   Best number for [facility] [phone]

## 2017-10-21 ENCOUNTER — Other Ambulatory Visit: Payer: Self-pay

## 2017-10-21 MED ORDER — PHENTERMINE HCL 37.5 MG PO CAPS: 37.5000 mg | ORAL_CAPSULE | Freq: Every morning | ORAL | 0 refills | Status: DC

## 2017-10-24 NOTE — Telephone Encounter (Signed)
Okay for 90 day supply

## 2017-10-24 NOTE — Telephone Encounter (Signed)
Pt wants a 90 day supply. I recall her asking before and you approved it only for 60days from 30days. Please advise.

## 2017-10-25 ENCOUNTER — Other Ambulatory Visit: Payer: Self-pay

## 2017-10-25 MED ORDER — PHENTERMINE HCL 37.5 MG PO CAPS: 37.5000 mg | ORAL_CAPSULE | Freq: Every morning | ORAL | 0 refills | Status: DC

## 2017-10-25 NOTE — Telephone Encounter (Signed)
30 more tablets sent in with the previous 60 tablets to complete the 90 days. Pt notified via detailed vm.

## 2017-10-25 NOTE — Telephone Encounter (Signed)
Left detailed message informing pt of update. 

## 2017-10-26 ENCOUNTER — Other Ambulatory Visit: Payer: Self-pay | Admitting: Internal Medicine

## 2017-10-26 NOTE — Telephone Encounter (Signed)
Patient  Calling about her Phentermine Rx - pharmacy does not have prescription Is this being faxed Tillie Rung? I see if was approved and printed 10/25/17  .please advise

## 2017-10-31 DIAGNOSIS — R2681 Unsteadiness on feet: Secondary | ICD-10-CM | POA: Diagnosis not present

## 2017-11-02 DIAGNOSIS — R2681 Unsteadiness on feet: Secondary | ICD-10-CM | POA: Diagnosis not present

## 2017-11-07 DIAGNOSIS — R2681 Unsteadiness on feet: Secondary | ICD-10-CM | POA: Diagnosis not present

## 2017-11-09 DIAGNOSIS — R2681 Unsteadiness on feet: Secondary | ICD-10-CM | POA: Diagnosis not present

## 2017-11-14 DIAGNOSIS — R2681 Unsteadiness on feet: Secondary | ICD-10-CM | POA: Diagnosis not present

## 2017-11-21 DIAGNOSIS — R2681 Unsteadiness on feet: Secondary | ICD-10-CM | POA: Diagnosis not present

## 2017-11-23 DIAGNOSIS — R2681 Unsteadiness on feet: Secondary | ICD-10-CM | POA: Diagnosis not present

## 2017-11-24 DIAGNOSIS — M1712 Unilateral primary osteoarthritis, left knee: Secondary | ICD-10-CM | POA: Diagnosis not present

## 2017-11-28 DIAGNOSIS — R2681 Unsteadiness on feet: Secondary | ICD-10-CM | POA: Diagnosis not present

## 2017-11-30 ENCOUNTER — Other Ambulatory Visit: Payer: Self-pay | Admitting: *Deleted

## 2017-11-30 ENCOUNTER — Telehealth: Payer: Self-pay | Admitting: Internal Medicine

## 2017-11-30 MED ORDER — METFORMIN HCL 1000 MG PO TABS: 1000.0000 mg | ORAL_TABLET | Freq: Two times a day (BID) | ORAL | 1 refills | Status: DC

## 2017-11-30 NOTE — Telephone Encounter (Signed)
Copied from [place] [id number]. Topic: Quick Communication - Rx Refill/Question >> Nov 30, 2017 10:49 AM Scherrie Gerlach wrote: Medication: metFORMIN (GLUCOPHAGE) 1000 MG tablet  90 day [facility] calling to request.  They say they have faxed several times.  Unable to escribe at present due to just being switched from a [employer]. (working on it) [facility] [place], [place] AT [address] [phone] (Phone) [phone] (Fax)

## 2017-11-30 NOTE — Telephone Encounter (Signed)
Medication refill for 6 months per protocol.

## 2017-12-02 ENCOUNTER — Telehealth: Payer: Self-pay | Admitting: *Deleted

## 2017-12-02 DIAGNOSIS — M1711 Unilateral primary osteoarthritis, right knee: Secondary | ICD-10-CM

## 2017-12-02 NOTE — Telephone Encounter (Signed)
Copied from [place] [id number]. Topic: General - Other >> Dec 02, 2017 11:57 AM Alfredia Ferguson R wrote: Pt is wanting a call back from [place]. Pt states she needs a note written to her insurance company stating she needs assistance. It needs to say she qualify's for service.

## 2017-12-06 ENCOUNTER — Encounter: Payer: Self-pay | Admitting: *Deleted

## 2017-12-06 ENCOUNTER — Other Ambulatory Visit: Payer: Self-pay | Admitting: *Deleted

## 2017-12-06 NOTE — Telephone Encounter (Signed)
Okay for letter and for PT referral

## 2017-12-06 NOTE — Patient Outreach (Signed)
[facility] ([facility]) Care Management  12/06/2017  Emmelina C Sontag 06/14/1940 [id number]   CSW was able to make initial contact with patient today to perform phone assessment, as well as assess and assist with social work needs and services.  CSW introduced self, explained role and types of services provided through [facility] Care Management ([facility] Care Management).  CSW further explained to patient that CSW works with patient's Telephonic RNCM, also with [facility] Care Management, Davina Green. CSW then explained the reason for the call, indicating that Mrs. Green thought that patient would benefit from social work services and resources to assist with arranging in-home care services for patient's husband, David Lamorte.  CSW obtained two HIPAA compliant identifiers from patient, which included patient's name and date of birth.  CSW spent the majority of the conversation trying to explain to patient the reason for CSW's call.  Patient was unsure as to who made the referral for services, and why?  Patient indicated that Mr. Genson is already receiving in-home care services through an agency of their choice and that the number of hours that patient currently receives per week is sufficient.  Patient was unable to identify any social work specific needs at present, nor was patient interested in having CSW schedule a home visit to assess further.  Patient took down CSW's contact information, agreeing to contact CSW directly if anything changes or if social work specific needs are identified in the near future.  CSW will perform a case closure on patient, as all goals of treatment have been met from social work standpoint and no additional social work needs have been identified at this time.  CSW will notify patient's Telephonic RNCM with [facility] Care Management, Davina Green of CSW's plans to close patient's case.  Patient will continue to be followed telephonically by Mrs.  Green.  CSW will fax an update to patient's Primary Care Physician, Dr. Peter Kwiatkoski to ensure that they are aware of CSW's involvement with patient's plan of care.   , BSW, MSW, LCSW  Licensed Clinical Social Worker  [facility] Care Management [facility]  Mailing Address-[address], [place], [place] [address] Physical Address-[address], [place], [place] [address] Toll Free Main # [phone] Fax # [phone] Cell # [phone]  Office # [phone] [email or web address]          

## 2017-12-06 NOTE — Telephone Encounter (Signed)
Pt calling and needs help with transportation to get back and forth to some OV. Pt also requesting PT to help with her balance. Pt stated she has  Pt with medicare but the time is ending. She stated she loves going and she doesn't want it to end.   Okay for these orders? Please advise

## 2017-12-07 ENCOUNTER — Other Ambulatory Visit: Payer: Self-pay

## 2017-12-07 DIAGNOSIS — R2681 Unsteadiness on feet: Secondary | ICD-10-CM | POA: Diagnosis not present

## 2017-12-07 NOTE — Telephone Encounter (Signed)
Noted  

## 2017-12-07 NOTE — Patient Outreach (Signed)
[facility]) Care Management  12/07/2017  TIFFANYANN DEROO 1940/07/12 [id number]   Self referral:  Care coordination:    Telephone call to patient regarding referral. Unable to reach patient. HIPAA compliant voice message left with call back phone number.   PLAN: RNCM will attempt 2nd telephone call to patient within 4 business days. RNCM will send outreach letter.   Quinn Plowman RN,BSN,CCM [facility] Telephonic  [phone]

## 2017-12-07 NOTE — Telephone Encounter (Signed)
Left message to return phone call.

## 2017-12-07 NOTE — Telephone Encounter (Signed)
Pt referral placed. Pt notified of resources for transportation.

## 2017-12-08 ENCOUNTER — Other Ambulatory Visit: Payer: Self-pay

## 2017-12-08 NOTE — Patient Outreach (Signed)
[facility]) Care Management  12/08/2017  Leslie Benton December 31, 1940 [id number]  TELEPHONE SCREENING Referral date: 11/25/17 Referral source: self referral Referral reason: assistance with husband/ social worker request Insurance: Medicare Attempt #2  Telephone call to patient regarding referral. Unable to reach patient. HIPAA compliant voice message left with call back phone number.    PLAN: RNCM will attempt 3rd telephone call to patient within 4 business days.   Quinn Plowman RN,BSN,CCM [facility] Telephonic  [phone]

## 2017-12-09 ENCOUNTER — Telehealth: Payer: Self-pay | Admitting: *Deleted

## 2017-12-09 NOTE — Telephone Encounter (Signed)
Noted  

## 2017-12-09 NOTE — Telephone Encounter (Signed)
Copied from [place] #[id number]. Topic: General - Other >> Dec 09, 2017 11:00 AM Berneta Levins wrote: Reason for CRM:   Pt's daughter calling.  States that she feels her mother needs to be given a letter for long term care at next [place].

## 2017-12-10 ENCOUNTER — Other Ambulatory Visit: Payer: Self-pay | Admitting: Internal Medicine

## 2017-12-12 DIAGNOSIS — R2681 Unsteadiness on feet: Secondary | ICD-10-CM | POA: Diagnosis not present

## 2017-12-13 ENCOUNTER — Ambulatory Visit: Payer: Self-pay

## 2017-12-14 ENCOUNTER — Other Ambulatory Visit: Payer: Self-pay

## 2017-12-14 DIAGNOSIS — R2681 Unsteadiness on feet: Secondary | ICD-10-CM | POA: Diagnosis not present

## 2017-12-14 NOTE — Patient Outreach (Signed)
[facility]) Care Management  12/14/2017  MARYON KEMNITZ [birth year] [id number]  TELEPHONE SCREENING Referral date: 11/25/17 Referral source: self referral Isurance: Medicare  Telephone call to patient regarding self referral. HIPAA verified with patient. Explained reason for call. Discussed [facility] care management services with patient.  Patient states she needs assistance with transportation to her doctor appointment.  She states she doesn't like to drive anymore.  Patient refused [facility] nursing follow up.  Patient states she has not been able to get transportation in the past but would like to see if she can get assistance with it now.   Patient refused to complete PHQ-9 with RNCM. Patient states she did not have a lot of time because she has an appointment this afternoon.  Patient states, "I become overwhelmed with simple things." Patient states she has anxiety that she takes medication for.  RNCM provided patient with [facility] contact phone number and name.   PLAN:  RNCM will refer patient to social worker for transportation assistance.   Quinn Plowman RN,BSN,CCM [facility] Telephonic  [phone]

## 2017-12-20 ENCOUNTER — Other Ambulatory Visit: Payer: Self-pay

## 2017-12-20 NOTE — Patient Outreach (Signed)
[facility]) Care Management  12/20/2017  Leslie Benton [birth year] [id number]  BSW attempted to contact the patient on today's date to conduct a community resource consult. Unfortunately, today's call was unsuccessful. BSW left the patient a HIPAA compliant voice message requesting a return call.   Plan: BSW will mail the patient an unsuccessful outreach letter. BSW will attempt the patient again within the next four business days.  Daneen Schick, BSW, CDP [facility] [phone]

## 2017-12-26 ENCOUNTER — Other Ambulatory Visit: Payer: Self-pay

## 2017-12-26 NOTE — Patient Outreach (Signed)
[facility]) Care Management  12/26/2017  Leslie Benton 1940/07/27 [id number]  Successful outreach to the patient on today's date, HIPAA identifiers confirmed. BSW introduced self to the patient and the reason for today's call, indicating the patient had recently placed a self referral for assistance with transportation. As BSW attempted to gather information from the patient pertaining to transportation needs the patient stated "is this going to take long". BSW explained that gathering information was important in order to determine which resources would be the best fit.  The patient stated "I want someone to take me all the way into appointments and be there as soon as I am ready. I do not want to be dropped off at the door". BSW provided the patient with the number to [employer] [facility] [place] and explained the [employer] The patient was upset that she would have to provide a 10 day notice to access this service. BSW explained that unfortunately it is the only resource that would wait with the patient unless she wanted to privately pay someone.   BSW to perform a case closure as resources have been provided. BSW attempted to discuss other resources such as [facility] but the patient stated she "had to take my dog somewhere" and could no longer talk.  Daneen Schick, BSW, CDP [facility] [phone]

## 2017-12-27 ENCOUNTER — Telehealth: Payer: Self-pay | Admitting: Internal Medicine

## 2017-12-27 DIAGNOSIS — D509 Iron deficiency anemia, unspecified: Secondary | ICD-10-CM | POA: Diagnosis not present

## 2017-12-27 DIAGNOSIS — K625 Hemorrhage of anus and rectum: Secondary | ICD-10-CM | POA: Diagnosis not present

## 2017-12-27 DIAGNOSIS — R000 Tachycardia, unspecified: Secondary | ICD-10-CM | POA: Diagnosis not present

## 2017-12-27 DIAGNOSIS — K6289 Other specified diseases of anus and rectum: Secondary | ICD-10-CM | POA: Diagnosis not present

## 2017-12-27 NOTE — Telephone Encounter (Signed)
Med list faxed to [facility]

## 2017-12-27 NOTE — Telephone Encounter (Signed)
Copied from [place] [id number]. Topic: General - Other >> Dec 27, 2017  1:21 PM Yvette Rack wrote: Reason for CRM: Wells Guiles with [facility] states pt is currently in their office and she would need the pt  list of current medications to be faxed to [phone].

## 2017-12-29 ENCOUNTER — Ambulatory Visit: Payer: Medicare Other | Admitting: Endocrinology

## 2017-12-30 DIAGNOSIS — R2681 Unsteadiness on feet: Secondary | ICD-10-CM | POA: Diagnosis not present

## 2018-01-02 ENCOUNTER — Ambulatory Visit (INDEPENDENT_AMBULATORY_CARE_PROVIDER_SITE_OTHER): Payer: Medicare Other | Admitting: Internal Medicine

## 2018-01-02 ENCOUNTER — Encounter: Payer: Self-pay | Admitting: Internal Medicine

## 2018-01-02 VITALS — BP 130/70 | HR 94 | Temp 98.3°F | Wt 180.6 lb

## 2018-01-02 DIAGNOSIS — I10 Essential (primary) hypertension: Secondary | ICD-10-CM | POA: Diagnosis not present

## 2018-01-02 DIAGNOSIS — M150 Primary generalized (osteo)arthritis: Secondary | ICD-10-CM | POA: Diagnosis not present

## 2018-01-02 DIAGNOSIS — E119 Type 2 diabetes mellitus without complications: Secondary | ICD-10-CM

## 2018-01-02 DIAGNOSIS — E039 Hypothyroidism, unspecified: Secondary | ICD-10-CM | POA: Diagnosis not present

## 2018-01-02 DIAGNOSIS — M159 Polyosteoarthritis, unspecified: Secondary | ICD-10-CM

## 2018-01-02 LAB — POCT GLYCOSYLATED HEMOGLOBIN (HGB A1C): Hemoglobin A1C: 13.3 % — AB (ref 4.0–5.6)

## 2018-01-02 NOTE — Progress Notes (Signed)
Subjective:    Patient ID: Leslie Benton, female    DOB: August 17, 1940, 77 y.o.   MRN: [id number]  HPI  Lab Results  Component Value Date   HGBA1C 12.2 (H) 07/26/2017   Hemoglobin A1c today 98.38  77 year old patient who is seen today for follow-up. Over the past 2 and half months there is been some significant weight loss, possibly secondary to uncontrolled diabetes. She states that she continues to take metformin only and often forgets the second dose.  She states that she often just cannot remember.  She does have a weekly pillbox at home.  Her husband does receive home health care but she refuses any assistance. No recent eye examination.  She has seen GI recently.  The patient does have some mobility issues but did drive herself here for today's appointment. She complains of significant stress.  She is scheduled for orthopedic follow-up.  She does have some assistance from her son  She has declined services with patient out reach programs.  Her daughter has encouraged her to obtain home health assistance for herself in addition to her husband.  No home blood sugar monitoring  Past Medical History:  Diagnosis Date  . Anemia   . Anxiety   . Cervical spondylosis without myelopathy 05/29/2015  . COLONIC POLYPS, HX OF 02/07/2008  . DEGENERATIVE JOINT DISEASE 10/14/2006   03/31/11: Pt states arthritis in her hip.  . Depression   . DIABETES MELLITUS, TYPE II 10/27/2009  . GERD 10/14/2006  . HYPERLIPIDEMIA 10/14/2006  . HYPERTENSION 10/14/2006   off bp meds now  . HYPOTHYROIDISM 10/14/2006  . LOW BACK PAIN 08/04/2007  . Obesity      Social History   Socioeconomic History  . Marital status: Married    Spouse name: Not on file  . Number of children: 2  . Years of education: college  . Highest education level: Not on file  Occupational History  . Occupation: retired  Scientific laboratory technician  . Financial resource strain: Not on file  . Food insecurity:    Worry: Not on file    Inability:  Not on file  . Transportation needs:    Medical: Not on file    Non-medical: Not on file  Tobacco Use  . Smoking status: Never Smoker  . Smokeless tobacco: Never Used  . Tobacco comment: Never Used Tobacco  Substance and Sexual Activity  . Alcohol use: No    Alcohol/week: 0.0 standard drinks  . Drug use: No  . Sexual activity: Not on file  Lifestyle  . Physical activity:    Days per week: Not on file    Minutes per session: Not on file  . Stress: Not on file  Relationships  . Social connections:    Talks on phone: Not on file    Gets together: Not on file    Attends religious service: Not on file    Active member of club or organization: Not on file    Attends meetings of clubs or organizations: Not on file    Relationship status: Not on file  . Intimate partner violence:    Fear of current or ex partner: Not on file    Emotionally abused: Not on file    Physically abused: Not on file    Forced sexual activity: Not on file  Other Topics Concern  . Not on file  Social History Narrative   Lives with husband and son.     Patient drinks 1-2 cups of caffeine  daily.   Patient is left handed.     Past Surgical History:  Procedure Laterality Date  . HEMORRHOID SURGERY    . Left knee arthroscopic  April 2014   Dr. Durward Fortes  . PARTIAL KNEE ARTHROPLASTY Left 09/23/2014   Procedure: LEFT UNICOMPARTMENTAL KNEE;  Surgeon: [name], MD;  Location: [facility];  Service: Orthopedics;  Laterality: Left;  . TONSILLECTOMY    . TUBAL LIGATION      Family History  Problem Relation Age of Onset  . Heart attack Father 37  . Kidney failure Mother   . Cancer Sister        endometrial  . Cancer Brother        Adrenal    Allergies  Allergen Reactions  . Niacin And Related Hives and Swelling    No anaphalaxis, but strong reactions.  . Lactose Intolerance (Gi)     "bad taste in mouth"  . Amoxicillin Swelling    hives  . Lactase   . Penicillins Swelling    Hives With all  cillins    Current Outpatient Medications on File Prior to Visit  Medication Sig Dispense Refill  . acetaminophen (TYLENOL) 500 MG tablet Take 500 mg by mouth every 6 (six) hours as needed for moderate pain or headache.    . Blood Glucose Monitoring Suppl (ACCU-CHEK AVIVA PLUS) W/DEVICE KIT USE AS DIRECTED TO CHECK BLOOD SUGARS 1 kit 0  . busPIRone (BUSPAR) 15 MG tablet Take 1 tablet (15 mg total) by mouth daily. 180 tablet 3  . fluocinonide (LIDEX) 0.05 % external solution One drop in left ear canal at bedtime for 10 days 60 mL 0  . glipiZIDE (GLUCOTROL XL) 5 MG 24 hr tablet Take 1 tablet (5 mg total) by mouth daily with breakfast. 90 tablet 4  . glucose blood (ACCU-CHEK AVIVA) test strip USE TO CHECK BLOOD SUGAR DAILY AND PRN 100 each 12  . ibuprofen (ADVIL,MOTRIN) 200 MG tablet Take 400 mg by mouth as needed.    . Lancets (ACCU-CHEK MULTICLIX) lancets USE TO CHECK BLOOD SUGAR DAILY AND PRN 100 each 4  . levothyroxine (SYNTHROID, LEVOTHROID) 150 MCG tablet TAKE 1 TABLET BY MOUTH EVERY MORNING ON AN EMPTY STOMACH 90 tablet 0  . lisinopril (PRINIVIL,ZESTRIL) 20 MG tablet Take 1 tablet (20 mg total) by mouth daily. 90 tablet 3  . loratadine (CLARITIN) 10 MG tablet Take 10 mg by mouth daily.    . metFORMIN (GLUCOPHAGE) 1000 MG tablet Take 1 tablet (1,000 mg total) by mouth 2 (two) times daily with a meal. 180 tablet 1  . Multiple Vitamins-Minerals (MULTI-VITAMIN GUMMIES PO) Take 2 each by mouth daily.    . phentermine 37.5 MG capsule Take 1 capsule (37.5 mg total) by mouth every morning. 30 capsule 0  . pioglitazone (ACTOS) 30 MG tablet Take 1 tablet (30 mg total) by mouth daily. 90 tablet 3  . propranolol (INDERAL) 20 MG tablet Take 1 tablet (20 mg total) by mouth 2 (two) times daily. 180 tablet 3  . simvastatin (ZOCOR) 20 MG tablet Take 1 tablet (20 mg total) by mouth at bedtime. 90 tablet 3  . terconazole (TERAZOL 3) 0.8 % vaginal cream Apply small amounts twice daily to the skin rash 20 g 0    . mesalamine (CANASA) 1000 MG suppository 1 SUPPOSITORY AT BEDTIME ONCE A DAY RECTALLY  3   No current facility-administered medications on file prior to visit.     BP 130/70 (BP Location: Right Arm, Patient Position:  Sitting, Cuff Size: Large)   Pulse 94   Temp 98.3 F (36.8 C) (Oral)   Wt 180 lb 9.6 oz (81.9 kg)   SpO2 95%   BMI 31.99 kg/m       Review of Systems  Constitutional: Positive for unexpected weight change.  HENT: Negative for congestion, dental problem, hearing loss, rhinorrhea, sinus pressure, sore throat and tinnitus.   Eyes: Negative for pain, discharge and visual disturbance.  Respiratory: Negative for cough and shortness of breath.   Cardiovascular: Negative for chest pain, palpitations and leg swelling.  Gastrointestinal: Negative for abdominal distention, abdominal pain, blood in stool, constipation, diarrhea, nausea and vomiting.  Genitourinary: Negative for difficulty urinating, dysuria, flank pain, frequency, hematuria, pelvic pain, urgency, vaginal bleeding, vaginal discharge and vaginal pain.  Musculoskeletal: Positive for arthralgias and gait problem. Negative for joint swelling.  Skin: Negative for rash.  Neurological: Negative for dizziness, syncope, speech difficulty, weakness, numbness and headaches.  Hematological: Negative for adenopathy.  Psychiatric/Behavioral: Positive for behavioral problems and decreased concentration. Negative for agitation and dysphoric mood. The patient is nervous/anxious.        Objective:   Physical Exam  Constitutional: She is oriented to person, place, and time. She appears well-developed and well-nourished. No distress.  Weight 180 Blood pressure well controlled No distress No tachycardia  HENT:  Head: Normocephalic.  Right Ear: External ear normal.  Left Ear: External ear normal.  Mouth/Throat: Oropharynx is clear and moist.  Eyes: Pupils are equal, round, and reactive to light. Conjunctivae and EOM are  normal.  Neck: Normal range of motion. Neck supple. No thyromegaly present.  Cardiovascular: Normal rate, regular rhythm and intact distal pulses.  Murmur heard. Grade 2/6 systolic murmur  Pulmonary/Chest: Effort normal and breath sounds normal.  Abdominal: Soft. Bowel sounds are normal. She exhibits no mass. There is no tenderness.  Musculoskeletal: Normal range of motion. She exhibits no edema.  Lymphadenopathy:    She has no cervical adenopathy.  Neurological: She is alert and oriented to person, place, and time.  Skin: Skin is warm and dry. No rash noted.  Psychiatric: She has a normal mood and affect. Her behavior is normal.          Assessment & Plan:   Diabetes mellitus.  Poorly controlled Noncompliance/poor social situation.  Home health assistance encouraged Weight loss.  Possibly secondary to uncontrolled diabetes Hypothyroidism Osteoarthritis Anxiety disorder  All medications refilled Detailed written instructions supplied Endocrinology referral encouraged.  She declines at this time stating that she has concerns about driving she does have an appointment scheduled on October 8  The patient has been advised to establish with a new provider within the next 1 to 3 months  Marletta Lor

## 2018-01-02 NOTE — Patient Instructions (Signed)
Limit your sodium (Salt) intake  Take all your medications as directed   Please check your hemoglobin A1c every 3 months  Please see your eye doctor yearly to check for diabetic eye damage  Return in 1 month and establish with a new provider at that time

## 2018-01-03 ENCOUNTER — Other Ambulatory Visit: Payer: Self-pay | Admitting: Internal Medicine

## 2018-01-04 ENCOUNTER — Ambulatory Visit: Payer: Medicare Other | Attending: Internal Medicine

## 2018-01-04 DIAGNOSIS — G8929 Other chronic pain: Secondary | ICD-10-CM | POA: Insufficient documentation

## 2018-01-04 DIAGNOSIS — M25561 Pain in right knee: Secondary | ICD-10-CM | POA: Insufficient documentation

## 2018-01-04 NOTE — Therapy (Signed)
[facility] [address], [place], [place], [address] Phone: [phone]   Fax:  [phone]  Patient Details  Name: Leslie Benton MRN: [id number] Date of Birth: 12-04-1940 Referring Provider:  Marletta Lor, MD  Encounter Date: 01/04/2018  Pt presented to PT for evaluation for Rt knee OA and balance.  Pt informed PT during evaluation that she is active in current PT plan of care for the same diagnosis at [place].  PT informed patient that insurance won't cover the same PT at 2 different locations.  Pt reported that her current PT "always discharges me before I am ready so I wanted to hold a spot here."  PT advised pt to complete her current plan of care and if she needs PT in the future then her MD can write a new order.  Pt verbalized understanding.  Eval was not complete today.     Sigurd Sos, PT 01/04/18 3:59 PM  [facility] [address] [place], [place], [address] Phone: [phone]   Fax:  [phone]

## 2018-01-05 NOTE — Telephone Encounter (Signed)
Okay for refill? Please advise 

## 2018-01-06 DIAGNOSIS — R2681 Unsteadiness on feet: Secondary | ICD-10-CM | POA: Diagnosis not present

## 2018-01-10 DIAGNOSIS — R2681 Unsteadiness on feet: Secondary | ICD-10-CM | POA: Diagnosis not present

## 2018-01-16 DIAGNOSIS — R2681 Unsteadiness on feet: Secondary | ICD-10-CM | POA: Diagnosis not present

## 2018-01-20 DIAGNOSIS — R2681 Unsteadiness on feet: Secondary | ICD-10-CM | POA: Diagnosis not present

## 2018-01-23 DIAGNOSIS — R2681 Unsteadiness on feet: Secondary | ICD-10-CM | POA: Diagnosis not present

## 2018-01-24 ENCOUNTER — Encounter: Payer: Self-pay | Admitting: Endocrinology

## 2018-01-24 ENCOUNTER — Ambulatory Visit (INDEPENDENT_AMBULATORY_CARE_PROVIDER_SITE_OTHER): Payer: Medicare Other | Admitting: Endocrinology

## 2018-01-24 VITALS — BP 154/100 | HR 91 | Ht 63.0 in | Wt 177.2 lb

## 2018-01-24 DIAGNOSIS — E119 Type 2 diabetes mellitus without complications: Secondary | ICD-10-CM | POA: Diagnosis not present

## 2018-01-24 MED ORDER — INSULIN GLARGINE 100 UNIT/ML SOLOSTAR PEN: 20.0000 [IU] | PEN_INJECTOR | SUBCUTANEOUS | 99 refills | Status: DC

## 2018-01-24 NOTE — Progress Notes (Signed)
Subjective:    Patient ID: Leslie Benton, female    DOB: Sep 03, 1940, 77 y.o.   MRN: [id number]  HPI pt is referred by Dr Burnice [name], for diabetes.  Pt says DM was dx'ed in 2008; she has mild neuropathy of the lower extremities; she is unaware of any associated chronic complications; she has never been on insulin; pt says her diet is poor but exercise is good; she has never had GDM, pancreatitis, pancreatic surgery, severe hypoglycemia or DKA.  She takes 3 oral meds.  She does not check cbg's.  Pt lives with husband, who is in poor health.   Past Medical History:  Diagnosis Date  . Anemia   . Anxiety   . Cervical spondylosis without myelopathy 05/29/2015  . COLONIC POLYPS, HX OF 02/07/2008  . DEGENERATIVE JOINT DISEASE 10/14/2006   03/31/11: Pt states arthritis in her hip.  . Depression   . DIABETES MELLITUS, TYPE II 10/27/2009  . GERD 10/14/2006  . HYPERLIPIDEMIA 10/14/2006  . HYPERTENSION 10/14/2006   off bp meds now  . HYPOTHYROIDISM 10/14/2006  . LOW BACK PAIN 08/04/2007  . Obesity     Past Surgical History:  Procedure Laterality Date  . HEMORRHOID SURGERY    . Left knee arthroscopic  April 2014   Dr. Durward Fortes  . PARTIAL KNEE ARTHROPLASTY Left 09/23/2014   Procedure: LEFT UNICOMPARTMENTAL KNEE;  Surgeon: [name], MD;  Location: [facility];  Service: Orthopedics;  Laterality: Left;  . TONSILLECTOMY    . TUBAL LIGATION      Social History   Socioeconomic History  . Marital status: Married    Spouse name: Not on file  . Number of children: 2  . Years of education: college  . Highest education level: Not on file  Occupational History  . Occupation: retired  Scientific laboratory technician  . Financial resource strain: Not on file  . Food insecurity:    Worry: Not on file    Inability: Not on file  . Transportation needs:    Medical: Not on file    Non-medical: Not on file  Tobacco Use  . Smoking status: Never Smoker  . Smokeless tobacco: Never Used  . Tobacco comment: Never Used  Tobacco  Substance and Sexual Activity  . Alcohol use: No    Alcohol/week: 0.0 standard drinks  . Drug use: No  . Sexual activity: Not on file  Lifestyle  . Physical activity:    Days per week: Not on file    Minutes per session: Not on file  . Stress: Not on file  Relationships  . Social connections:    Talks on phone: Not on file    Gets together: Not on file    Attends religious service: Not on file    Active member of club or organization: Not on file    Attends meetings of clubs or organizations: Not on file    Relationship status: Not on file  . Intimate partner violence:    Fear of current or ex partner: Not on file    Emotionally abused: Not on file    Physically abused: Not on file    Forced sexual activity: Not on file  Other Topics Concern  . Not on file  Social History Narrative   Lives with husband and son.     Patient drinks 1-2 cups of caffeine daily.   Patient is left handed.     Current Outpatient Medications on File Prior to Visit  Medication Sig Dispense Refill  .  acetaminophen (TYLENOL) 500 MG tablet Take 500 mg by mouth every 6 (six) hours as needed for moderate pain or headache.    . Blood Glucose Monitoring Suppl (ACCU-CHEK AVIVA PLUS) W/DEVICE KIT USE AS DIRECTED TO CHECK BLOOD SUGARS 1 kit 0  . busPIRone (BUSPAR) 15 MG tablet Take 1 tablet (15 mg total) by mouth daily. 180 tablet 3  . glucose blood (ACCU-CHEK AVIVA) test strip USE TO CHECK BLOOD SUGAR DAILY AND PRN 100 each 12  . ibuprofen (ADVIL,MOTRIN) 200 MG tablet Take 400 mg by mouth as needed.    . Lancets (ACCU-CHEK MULTICLIX) lancets USE TO CHECK BLOOD SUGAR DAILY AND PRN 100 each 4  . levothyroxine (SYNTHROID, LEVOTHROID) 150 MCG tablet TAKE 1 TABLET BY MOUTH EVERY MORNING ON AN EMPTY STOMACH 90 tablet 0  . loratadine (CLARITIN) 10 MG tablet Take 10 mg by mouth daily.    . mesalamine (CANASA) 1000 MG suppository as needed.   3  . metFORMIN (GLUCOPHAGE) 1000 MG tablet Take 1 tablet (1,000 mg  total) by mouth 2 (two) times daily with a meal. 180 tablet 1  . Multiple Vitamins-Minerals (MULTI-VITAMIN GUMMIES PO) Take 2 each by mouth daily.    . phentermine 37.5 MG capsule Take 1 capsule (37.5 mg total) by mouth every morning. 30 capsule 0  . propranolol (INDERAL) 20 MG tablet Take 1 tablet (20 mg total) by mouth 2 (two) times daily. (Patient taking differently: Take 20 mg by mouth daily. ) 180 tablet 3  . simvastatin (ZOCOR) 20 MG tablet Take 1 tablet (20 mg total) by mouth at bedtime. 90 tablet 3  . glipiZIDE (GLUCOTROL XL) 5 MG 24 hr tablet Take 1 tablet (5 mg total) by mouth daily with breakfast. (Patient not taking: Reported on 01/24/2018) 90 tablet 4  . lisinopril (PRINIVIL,ZESTRIL) 20 MG tablet Take 1 tablet (20 mg total) by mouth daily. (Patient not taking: Reported on 01/24/2018) 90 tablet 3  . pioglitazone (ACTOS) 30 MG tablet TAKE 1 TABLET BY MOUTH EVERY DAY (Patient not taking: Reported on 01/24/2018) 90 tablet 0   No current facility-administered medications on file prior to visit.     Allergies  Allergen Reactions  . Niacin And Related Hives and Swelling    No anaphalaxis, but strong reactions.  . Lactose Intolerance (Gi)     "bad taste in mouth"  . Amoxicillin Swelling    hives  . Lactase   . Penicillins Swelling    Hives With all cillins    Family History  Problem Relation Age of Onset  . Heart attack Father 52  . Diabetes Father   . Kidney failure Mother   . Cancer Sister        endometrial  . Cancer Brother        Adrenal    BP (!) 154/100 (BP Location: Left Arm)   Pulse 91   Ht 5' 3"  (1.6 m)   Wt 177 lb 3.2 oz (80.4 kg)   SpO2 96%   BMI 31.39 kg/m     Review of Systems denies weight loss, blurry vision, headache, chest pain, sob, n/v, urinary frequency, muscle cramps, excessive diaphoresis, depression, cold intolerance, rhinorrhea, and easy bruising.  She has anxiety and memory loss.      Objective:   Physical Exam VS: see vs page GEN: no  distress HEAD: head: no deformity eyes: no periorbital swelling, no proptosis external nose and ears are normal mouth: no lesion seen NECK: supple, thyroid is not enlarged CHEST WALL: no  deformity LUNGS: clear to auscultation CV: reg rate and rhythm, no murmur ABD: abdomen is soft, nontender.  no hepatosplenomegaly.  not distended.  no hernia MUSCULOSKELETAL: muscle bulk and strength are grossly normal.  no obvious joint swelling.  gait is normal and steady.   EXTEMITIES: no deformity.  no ulcer on the feet.  feet are of normal color and temp.  Trace bilat leg edema.  There is bilateral onychomycosis of the toenails.   PULSES: dorsalis pedis intact bilat.  no carotid bruit NEURO:  cn 2-12 grossly intact.   readily moves all 4's.  sensation is intact to touch on the feet.   SKIN:  Normal texture and temperature.  No rash or suspicious lesion is visible.   NODES:  None palpable at the neck PSYCH: alert, well-oriented.  Does not appear anxious nor depressed.   Lab Results  Component Value Date   HGBA1C 13.3 (A) 01/02/2018   Lab Results  Component Value Date   CREATININE 1.08 07/26/2017   BUN 20 07/26/2017   NA 136 07/26/2017   K 4.2 07/26/2017   CL 100 07/26/2017   CO2 26 07/26/2017   I personally reviewed electrocardiogram tracing (07/02/14): Indication: severe anemia Impression: ST.  Ant QS complexes.  No hypertrophy. Compared to 2015: ST is new  I have reviewed outside records, and summarized: Pt was noted to have severely elevated a1c, and referred here.  Med compliance was discussed.  She declined outreach services.       Assessment & Plan:  Type 2 DM, new to me.  Severe exacerbation.  I demonstrated insulin pen.   Memory loss, although good today, this is a contraindication to multiple daily injections.    Patient Instructions  Your blood pressure is high today.  Please see your primary care provider soon, to have it rechecked good diet and exercise significantly  improve the control of your diabetes.  please let me know if you wish to be referred to a dietician.  high blood sugar is very risky to your health.  you should see an eye doctor and dentist every year.  It is very important to get all recommended vaccinations.  Controlling your blood pressure and cholesterol drastically reduces the damage diabetes does to your body.  Those who smoke should quit.  Please discuss these with your doctor.  check your blood sugar twice a day.  vary the time of day when you check, between before the 3 meals, and at bedtime.  also check if you have symptoms of your blood sugar being too high or too low.  please keep a record of the readings and bring it to your next appointment here (or you can bring the meter itself).  You can write it on any piece of paper.  please call us sooner if your blood sugar goes below 70, or if you have a lot of readings over 200. Please see Mickel Baas soon, to learn about the insulin pen.   Please come back for a follow-up appointment in 2 weeks.

## 2018-01-24 NOTE — Patient Instructions (Addendum)
Your blood pressure is high today.  Please see your primary care provider soon, to have it rechecked good diet and exercise significantly improve the control of your diabetes.  please let me know if you wish to be referred to a dietician.  high blood sugar is very risky to your health.  you should see an eye doctor and dentist every year.  It is very important to get all recommended vaccinations.  Controlling your blood pressure and cholesterol drastically reduces the damage diabetes does to your body.  Those who smoke should quit.  Please discuss these with your doctor.  check your blood sugar twice a day.  vary the time of day when you check, between before the 3 meals, and at bedtime.  also check if you have symptoms of your blood sugar being too high or too low.  please keep a record of the readings and bring it to your next appointment here (or you can bring the meter itself).  You can write it on any piece of paper.  please call us sooner if your blood sugar goes below 70, or if you have a lot of readings over 200. Please see Mickel Baas soon, to learn about the insulin pen.   Please come back for a follow-up appointment in 2 weeks.

## 2018-01-25 DIAGNOSIS — R2681 Unsteadiness on feet: Secondary | ICD-10-CM | POA: Diagnosis not present

## 2018-01-26 ENCOUNTER — Other Ambulatory Visit: Payer: Self-pay | Admitting: Internal Medicine

## 2018-01-26 ENCOUNTER — Telehealth: Payer: Self-pay | Admitting: Family Medicine

## 2018-01-26 ENCOUNTER — Ambulatory Visit: Payer: Self-pay

## 2018-01-26 DIAGNOSIS — E119 Type 2 diabetes mellitus without complications: Secondary | ICD-10-CM

## 2018-01-26 NOTE — Telephone Encounter (Signed)
Requested medication (s) are due for refill today: Yes  Requested medication (s) are on the active medication list: No  Last refill:  07/2017  Future visit scheduled: Yes  Notes to clinic:  Expired medication, see triage notes for explanation of why patient needing refill.     Requested Prescriptions  Pending Prescriptions Disp Refills   fluocinonide (LIDEX) 0.05 % external solution 60 mL 0    Sig: One drop in left ear canal at bedtime for 10 days     There is no refill protocol information for this order

## 2018-01-26 NOTE — Telephone Encounter (Signed)
Please see if Dr. Sherren Mocha will authorize new referral to endo.

## 2018-01-26 NOTE — Telephone Encounter (Signed)
Copied from [place] [id number]. Topic: General - Other >> Jan 26, 2018 10:34 AM Yvette Rack wrote: Reason for CRM: pt calling stating that DR Loanne Drilling had put in a referral for her to have two referrals for DM instructor and training she states that she needs help that she couldn't go anywhere that's on [address] she called an spoke with someone at [facility] office but they wasn't listening to her >> Jan 26, 2018 10:48 AM Yvette Rack wrote: If someone could call [place] office to explain this for patient to understand that the location on [address] isn't good for her its out of her range for someone to bring her

## 2018-01-26 NOTE — Telephone Encounter (Signed)
Copied from [place] [id number]. Topic: Quick Communication - See Telephone Encounter >> Jan 26, 2018  6:10 PM Blase Mess A wrote: CRM for notification. See Telephone encounter for: 01/26/18. Patient is calling because she is lost her ear drops and she does not know the name Please call back [phone]

## 2018-01-26 NOTE — Telephone Encounter (Signed)
Patient called in with c/o "ear itching and pain." She says "I've been having problems with my ears off and on, but recently it's becoming more frequent. The itching is what bothers me and it keeps me up at night. I do have a little pain sometimes at night. This is my left ear. I had some ear drops to use and picked up the prescription, but I never used the drops and now I can't find them. Can I get another prescription?" I advised she will have to be seen in the office before medication be prescribed. She says "well, I have things going on at my house and I can't come in tomorrow. I have an appointment on Monday with Dr. Martinique, but can't wait until Monday to get something. Will you ask her if she will call the ear drops in for me to use over the weekend?" I asked about drainage, she denies. I asked about other symptoms, sinus symptoms, she denies. According to protocol, home care advice. I advised the patient to take Tylenol for pain and take Claritin, which is what she says she has at home for allergies. Advised someone from the office will call her tomorrow with Dr. Doug Sou recommendation. Will route prescription to office in a refill encounter.  Reason for Disposition . Mild earache and ear congestion (fullness) occurring during air travel  Answer Assessment - Initial Assessment Questions 1. LOCATION: "Which ear is involved?"     Left ear 2. ONSET: "When did the ear start hurting"      Off and on, recently more frequent 3. SEVERITY: "How bad is the pain?"  (Scale 1-10; mild, moderate or severe)   - MILD (1-3): doesn't interfere with normal activities    - MODERATE (4-7): interferes with normal activities or awakens from sleep    - SEVERE (8-10): excruciating pain, unable to do any normal activities      Itching is what bothers me, not much pain, but it keeps me up at night 4. URI SYMPTOMS: " Do you have a runny nose or cough?"     No 5. FEVER: "Do you have a fever?" If so, ask: "What is your  temperature, how was it measured, and when did it start?"     No 6. CAUSE: "Have you been swimming recently?", "How often do you use Q-TIPS?", "Have you had any recent air travel or scuba diving?"     No 7. OTHER SYMPTOMS: "Do you have any other symptoms?" (e.g., headache, stiff neck, dizziness, vomiting, runny nose, decreased hearing)     No 8. PREGNANCY: "Is there any chance you are pregnant?" "When was your last menstrual period?"     No  Protocols used: EARACHE-A-AH

## 2018-01-27 NOTE — Telephone Encounter (Signed)
Message sent to Dr. Jordan for review. 

## 2018-01-27 NOTE — Telephone Encounter (Signed)
Pt is transferring care to Dr. Martinique on 01/30/18.  Can you see if Dr. Martinique will look at this message?

## 2018-01-27 NOTE — Addendum Note (Signed)
Addended by: Gwenyth Ober R on: 01/27/2018 01:39 PM   Modules accepted: Orders

## 2018-01-27 NOTE — Telephone Encounter (Signed)
Given the fact I have not seen patient before I do not feel comfortable treating symptoms without an appropriate evaluation. If she cannot wait until Monday, we could arrange an appointment for Saturday clinic.  Thanks, BJ

## 2018-01-27 NOTE — Telephone Encounter (Signed)
Patient given instructions by Gwenyth Ober, CMA.  No further assistance needed at this time.

## 2018-01-27 NOTE — Telephone Encounter (Signed)
Spoke to pt and she stated that she didn't know what the referral was for and started talking about an itchy ear. Pt stated that she need someone to call in her a Rx for itchy ears. Pt was informed that I would see id another provider will be willing to call it in for her. Pt then remembered the referral and I explained different location and she agreed to location. Endo referral placed!

## 2018-01-30 ENCOUNTER — Ambulatory Visit (INDEPENDENT_AMBULATORY_CARE_PROVIDER_SITE_OTHER): Payer: Medicare Other | Admitting: Family Medicine

## 2018-01-30 ENCOUNTER — Other Ambulatory Visit: Payer: Self-pay | Admitting: Family Medicine

## 2018-01-30 ENCOUNTER — Encounter: Payer: Self-pay | Admitting: Family Medicine

## 2018-01-30 VITALS — BP 130/78 | HR 89 | Temp 97.7°F | Resp 16 | Ht 63.0 in | Wt 176.2 lb

## 2018-01-30 DIAGNOSIS — E039 Hypothyroidism, unspecified: Secondary | ICD-10-CM | POA: Diagnosis not present

## 2018-01-30 DIAGNOSIS — E1169 Type 2 diabetes mellitus with other specified complication: Secondary | ICD-10-CM | POA: Diagnosis not present

## 2018-01-30 DIAGNOSIS — I10 Essential (primary) hypertension: Secondary | ICD-10-CM

## 2018-01-30 DIAGNOSIS — E785 Hyperlipidemia, unspecified: Secondary | ICD-10-CM | POA: Diagnosis not present

## 2018-01-30 DIAGNOSIS — Z23 Encounter for immunization: Secondary | ICD-10-CM

## 2018-01-30 DIAGNOSIS — F329 Major depressive disorder, single episode, unspecified: Secondary | ICD-10-CM | POA: Diagnosis not present

## 2018-01-30 DIAGNOSIS — R5382 Chronic fatigue, unspecified: Secondary | ICD-10-CM | POA: Diagnosis not present

## 2018-01-30 MED ORDER — FLUOCINONIDE 0.05 % EX SOLN: 1.0000 "application " | Freq: Two times a day (BID) | CUTANEOUS | 1 refills | Status: DC

## 2018-01-30 NOTE — Assessment & Plan Note (Signed)
She reports problem as well controlled. She does not want to discuss this problem in detail, explained that this could contribute to her history of fatigue. No changes in BuSpar 15 mg daily. Follow-up in 3 months.

## 2018-01-30 NOTE — Assessment & Plan Note (Signed)
We discussed some side effects as well as benefits of statin medications. She is not interested in taking any medication for cholesterol. Recommend for now continuing low-fat diet. Lipid panel can be checked next visit, she is not fasting today.

## 2018-01-30 NOTE — Assessment & Plan Note (Signed)
Poorly controlled. She is not interested in resuming topical medications. She would like to continue just metformin 1000 mg twice daily. Educated about possible complications of poorly controlled diabetes. She needs an eye exam. She will continue following with endocrinologist.

## 2018-01-30 NOTE — Patient Instructions (Signed)
A few things to remember from today's visit:   High risk medication use  Dyslipidemia  Chronic fatigue  Major depressive disorder with current active episode, unspecified depression episode severity, unspecified whether recurrent  Type 2 diabetes mellitus with other specified complication, without long-term current use of insulin (HCC)   Fatigue can be related to some of your chronic medical problems, I strongly recommend having a sleep study to rule out obstructive sleep apnea. Depression and anxiety can also cause fatigue. Phentermine is not to treat fatigue and I do not feel comfortable continuing prescribing, if you decide to follow my recommendations, we can decrease phentermine from 1 tablet to half tablet daily and eventually with medication of.

## 2018-01-30 NOTE — Assessment & Plan Note (Signed)
Stable. No changes in current management. She will continue following with endocrinologist.

## 2018-01-30 NOTE — Telephone Encounter (Signed)
Ok to refill Lidex?

## 2018-01-30 NOTE — Progress Notes (Addendum)
HPI:   Leslie Benton is a 77 y.o. female, who is here today to establish care.  Former PCP: Dr. Burnice [name] Last preventive routine visit: 04/2013.  Chronic medical problems:  History of OA, mainly knee OA, she follows up with orthopedist (Dr. Wynelle Link). DM 2 on hypothyroidism, he follows with Dr. Loanne Drilling (endocrinologist).  Hypothyroidism: Currently she is on levothyroxine 150 mcg daily. Last TSH was 3.54 on 12/27/2016.   Hyperlipidemia: Discontinue simvastatin 20 mg because he was causing "side effects", feeling tired. She has not been consistent with low-fat diet.   Lab Results  Component Value Date   CHOL 282 (H) 08/05/2015   HDL 54.10 08/05/2015   LDLCALC 111 (H) 07/03/2014   LDLDIRECT 187.0 08/05/2015   TRIG 307.0 (H) 08/05/2015   CHOLHDL 5 08/05/2015   DM 2: Dx in 2008. She discontinued Actos, glipizide, and Lantus because "side effects", causing tiredness. Currently she is on metformin 1000 mg twice daily. She acknowledges that most of the time she is not compliant with dietary recommendations. She is not checking BS.   Lab Results  Component Value Date   HGBA1C 13.3 (A) 01/02/2018   She is looking into changing to a different endocrinologist because of location.   Hypertension: She does continue lisinopril 20 mg. She takes propranolol 20 mg daily as needed for palpitations. She is not checking BP at home. BP during recent endocrinology visit, 01/24/2018, was elevated at 154/100.  She feels like medication was causing fatigue.  He denies chest pain, dyspnea, decreased urine output, gross hematuria, or edema.  Lab Results  Component Value Date   CREATININE 1.08 07/26/2017   BUN 20 07/26/2017   NA 136 07/26/2017   K 4.2 07/26/2017   CL 100 07/26/2017   CO2 26 07/26/2017    Concerns today: Phentermine to refill.  States that she has been on phentermine 37.5 mg daily for several years. She has been taking it to help with fatigue. She  states that she "cannot function without phentermine."  "I need a stimulant." No known history of OSA. States that she feels somehow better in the morning, problem gets worse as the day goes. She has not identified exacerbating or alleviating factors. Phentermine helps some.  History of depression and anxiety, currently she is on BuSpar 15 mg 1 tablet daily. She feels like medication is helping with anxiety. She lives with her husband, who recently had a stroke, and her son.   Review of Systems  Constitutional: Positive for fatigue. Negative for activity change, appetite change and fever.  HENT: Negative for mouth sores, nosebleeds and trouble swallowing.   Eyes: Negative for redness and visual disturbance.  Respiratory: Negative for cough, shortness of breath and wheezing.   Cardiovascular: Negative for chest pain, palpitations and leg swelling.  Gastrointestinal: Negative for abdominal pain, nausea and vomiting.       Negative for changes in bowel habits.  Endocrine: Negative for cold intolerance, heat intolerance, polydipsia, polyphagia and polyuria.  Genitourinary: Negative for decreased urine volume, dysuria and hematuria.  Musculoskeletal: Positive for arthralgias and gait problem.  Neurological: Positive for numbness (Intermittent on fingers.). Negative for syncope, weakness and headaches.  Psychiatric/Behavioral: Negative for confusion and hallucinations. The patient is nervous/anxious.       Current Outpatient Medications on File Prior to Visit  Medication Sig Dispense Refill  . acetaminophen (TYLENOL) 500 MG tablet Take 500 mg by mouth every 6 (six) hours as needed for moderate pain or headache.    Marland Kitchen  Blood Glucose Monitoring Suppl (ACCU-CHEK AVIVA PLUS) W/DEVICE KIT USE AS DIRECTED TO CHECK BLOOD SUGARS 1 kit 0  . busPIRone (BUSPAR) 15 MG tablet Take 1 tablet (15 mg total) by mouth daily. 180 tablet 3  . glipiZIDE (GLUCOTROL XL) 5 MG 24 hr tablet Take 1 tablet (5 mg  total) by mouth daily with breakfast. 90 tablet 4  . glucose blood (ACCU-CHEK AVIVA) test strip USE TO CHECK BLOOD SUGAR DAILY AND PRN 100 each 12  . ibuprofen (ADVIL,MOTRIN) 200 MG tablet Take 400 mg by mouth as needed.    . Insulin Glargine (LANTUS SOLOSTAR) 100 UNIT/ML Solostar Pen Inject 20 Units into the skin every morning. And pen needles 1/day 5 pen PRN  . IRON PO Take 1 tablet by mouth daily.    . Lancets (ACCU-CHEK MULTICLIX) lancets USE TO CHECK BLOOD SUGAR DAILY AND PRN 100 each 4  . levothyroxine (SYNTHROID, LEVOTHROID) 150 MCG tablet TAKE 1 TABLET BY MOUTH EVERY MORNING ON AN EMPTY STOMACH 90 tablet 0  . lisinopril (PRINIVIL,ZESTRIL) 20 MG tablet Take 1 tablet (20 mg total) by mouth daily. 90 tablet 3  . loratadine (CLARITIN) 10 MG tablet Take 10 mg by mouth daily.    . mesalamine (CANASA) 1000 MG suppository as needed.   3  . metFORMIN (GLUCOPHAGE) 1000 MG tablet Take 1 tablet (1,000 mg total) by mouth 2 (two) times daily with a meal. 180 tablet 1  . Multiple Vitamins-Minerals (MULTI-VITAMIN GUMMIES PO) Take 2 each by mouth daily.    . phentermine 37.5 MG capsule Take 1 capsule (37.5 mg total) by mouth every morning. (Patient taking differently: Take 37.5 mg by mouth as needed. ) 30 capsule 0  . pioglitazone (ACTOS) 30 MG tablet TAKE 1 TABLET BY MOUTH EVERY DAY 90 tablet 0  . propranolol (INDERAL) 20 MG tablet Take 1 tablet (20 mg total) by mouth 2 (two) times daily. (Patient taking differently: Take 20 mg by mouth daily. ) 180 tablet 3  . simvastatin (ZOCOR) 20 MG tablet Take 1 tablet (20 mg total) by mouth at bedtime. 90 tablet 3   No current facility-administered medications on file prior to visit.      Past Medical History:  Diagnosis Date  . Anemia   . Anxiety   . Cervical spondylosis without myelopathy 05/29/2015  . COLONIC POLYPS, HX OF 02/07/2008  . DEGENERATIVE JOINT DISEASE 10/14/2006   03/31/11: Pt states arthritis in her hip.  . Depression   . DIABETES MELLITUS,  TYPE II 10/27/2009  . GERD 10/14/2006  . HYPERLIPIDEMIA 10/14/2006  . HYPERTENSION 10/14/2006   off bp meds now  . HYPOTHYROIDISM 10/14/2006  . LOW BACK PAIN 08/04/2007  . Obesity    Allergies  Allergen Reactions  . Niacin And Related Hives and Swelling    No anaphalaxis, but strong reactions.  . Lactose Intolerance (Gi)     "bad taste in mouth"  . Amoxicillin Swelling    hives  . Lactase   . Penicillins Swelling    Hives With all cillins    Family History  Problem Relation Age of Onset  . Heart attack Father 66  . Diabetes Father   . Kidney failure Mother   . Cancer Sister        endometrial  . Cancer Brother        Adrenal    Social History   Socioeconomic History  . Marital status: Married    Spouse name: Not on file  . Number of children: 2  .  Years of education: college  . Highest education level: Not on file  Occupational History  . Occupation: retired  Scientific laboratory technician  . Financial resource strain: Not on file  . Food insecurity:    Worry: Not on file    Inability: Not on file  . Transportation needs:    Medical: Not on file    Non-medical: Not on file  Tobacco Use  . Smoking status: Never Smoker  . Smokeless tobacco: Never Used  . Tobacco comment: Never Used Tobacco  Substance and Sexual Activity  . Alcohol use: No    Alcohol/week: 0.0 standard drinks  . Drug use: No  . Sexual activity: Not on file  Lifestyle  . Physical activity:    Days per week: Not on file    Minutes per session: Not on file  . Stress: Not on file  Relationships  . Social connections:    Talks on phone: Not on file    Gets together: Not on file    Attends religious service: Not on file    Active member of club or organization: Not on file    Attends meetings of clubs or organizations: Not on file    Relationship status: Not on file  Other Topics Concern  . Not on file  Social History Narrative   Lives with husband and son.     Patient drinks 1-2 cups of caffeine daily.     Patient is left handed.     Vitals:   01/30/18 1524  BP: 130/78  Pulse: 89  Resp: 16  Temp: 97.7 F (36.5 C)  SpO2: 97%    Body mass index is 31.22 kg/m.   Physical Exam  Nursing note and vitals reviewed. Constitutional: She is oriented to person, place, and time. She appears well-developed. No distress.  HENT:  Head: Normocephalic and atraumatic.  Mouth/Throat: Oropharynx is clear and moist and mucous membranes are normal.  Eyes: Pupils are equal, round, and reactive to light. Conjunctivae are normal.  Cardiovascular: Normal rate and regular rhythm.  Murmur (? soft SEM RUSB and LUSB) heard. Pulses:      Dorsalis pedis pulses are 2+ on the right side, and 2+ on the left side.  Respiratory: Effort normal and breath sounds normal. No respiratory distress.  GI: Soft. She exhibits no mass. There is no tenderness.  Musculoskeletal: She exhibits no edema.  Lymphadenopathy:    She has no cervical adenopathy.  Neurological: She is alert and oriented to person, place, and time. She has normal strength. No cranial nerve deficit.  Unstable gait, assisted with a cane.  Skin: Skin is warm. No rash noted. No erythema.  Psychiatric: Her mood appears anxious.  Well groomed, good eye contact.      ASSESSMENT AND PLAN:   Ms. Makylee was seen today for transfer of care.  Diagnoses and all orders for this visit:  Diabetes ([place]) Poorly controlled. She is not interested in resuming topical medications. She would like to continue just metformin 1000 mg twice daily. Educated about possible complications of poorly controlled diabetes. She needs an eye exam. She will continue following with endocrinologist.  Depression She reports problem as well controlled. She does not want to discuss this problem in detail, explained that this could contribute to her history of fatigue. No changes in BuSpar 15 mg daily. Follow-up in 3 months.  Dyslipidemia We discussed some side effects as  well as benefits of statin medications. She is not interested in taking any medication for cholesterol.  Recommend for now continuing low-fat diet. Lipid panel can be checked next visit, she is not fasting today.  Chronic fatigue We discussed possible etiologies: Systemic illness, immunologic,endocrinology,sleep disorder, psychiatric/psychologic, infectious,medications side effects, and idiopathic. Several of her chronic medical problems could be contributing to problem. She is not interested in having sleep study done, states that she does not have time. She would like to continue phentermine, explained that this medication is not indicated for chronic fatigue and it is not supposed to be taking for long.  At times.  We discussed some side effects. Recommend starting decreasing phentermine dose from 1 tablet to 0.5 tablet, we will continue weaning medication off.  Follow-up in 3 months.   Hypothyroidism Stable. No changes in current management. She will continue following with endocrinologist.  Essential hypertension Today BP is adequately controlled. She is not interested in resuming antihypertensive medications. Educated about possible complications of poorly controlled hypertension as well as benefits in regard to CVD prevention. Strongly recommend monitoring BP at home. Follow-up in 3 months.  Encounter for immunization -     Flu vaccine HIGH DOSE PF       Opie Maclaughlin G. Martinique, MD  [facility]. [place] office.

## 2018-01-30 NOTE — Assessment & Plan Note (Signed)
Today BP is adequately controlled. She is not interested in resuming antihypertensive medications. Educated about possible complications of poorly controlled hypertension as well as benefits in regard to CVD prevention. Strongly recommend monitoring BP at home. Follow-up in 3 months.

## 2018-01-30 NOTE — Assessment & Plan Note (Signed)
We discussed possible etiologies: Systemic illness, immunologic,endocrinology,sleep disorder, psychiatric/psychologic, infectious,medications side effects, and idiopathic. Several of her chronic medical problems could be contributing to problem. She is not interested in having sleep study done, states that she does not have time. She would like to continue phentermine, explained that this medication is not indicated for chronic fatigue and it is not supposed to be taking for long.  At times.  We discussed some side effects. Recommend starting decreasing phentermine dose from 1 tablet to 0.5 tablet, we will continue weaning medication off.  Follow-up in 3 months.

## 2018-02-01 DIAGNOSIS — R2681 Unsteadiness on feet: Secondary | ICD-10-CM | POA: Diagnosis not present

## 2018-02-04 ENCOUNTER — Other Ambulatory Visit: Payer: Self-pay | Admitting: Internal Medicine

## 2018-02-06 NOTE — Telephone Encounter (Signed)
Patient needs the medication asap because she lost the original meds.

## 2018-02-07 ENCOUNTER — Ambulatory Visit: Payer: Medicare Other | Admitting: Endocrinology

## 2018-02-27 ENCOUNTER — Telehealth: Payer: Self-pay | Admitting: *Deleted

## 2018-02-27 DIAGNOSIS — L989 Disorder of the skin and subcutaneous tissue, unspecified: Secondary | ICD-10-CM | POA: Diagnosis not present

## 2018-02-27 DIAGNOSIS — L309 Dermatitis, unspecified: Secondary | ICD-10-CM | POA: Diagnosis not present

## 2018-02-27 NOTE — Telephone Encounter (Signed)
Left detailed message for patient to let her know that per Dr. Martinique that if she came into the office around 2 pm that we would work in. Patient informed that she would probably have to wait, but we would work her in for her infected finger only.  Copied from [place] [id number]. Topic: Appointment Scheduling - Scheduling Inquiry for Clinic >> Feb 27, 2018 12:31 PM Bea Graff, NT wrote: Reason for CRM: Pt would like to see if she can possibly be worked in for today for her infected finger? Please advise.

## 2018-02-28 ENCOUNTER — Ambulatory Visit: Payer: Medicare Other | Admitting: Family Medicine

## 2018-02-28 DIAGNOSIS — L309 Dermatitis, unspecified: Secondary | ICD-10-CM | POA: Diagnosis not present

## 2018-02-28 DIAGNOSIS — Z0289 Encounter for other administrative examinations: Secondary | ICD-10-CM

## 2018-03-06 DIAGNOSIS — R5383 Other fatigue: Secondary | ICD-10-CM | POA: Diagnosis not present

## 2018-03-06 DIAGNOSIS — L309 Dermatitis, unspecified: Secondary | ICD-10-CM | POA: Diagnosis not present

## 2018-03-28 DIAGNOSIS — K625 Hemorrhage of anus and rectum: Secondary | ICD-10-CM | POA: Diagnosis not present

## 2018-03-28 DIAGNOSIS — K6289 Other specified diseases of anus and rectum: Secondary | ICD-10-CM | POA: Diagnosis not present

## 2018-05-15 ENCOUNTER — Other Ambulatory Visit: Payer: Self-pay | Admitting: Family Medicine

## 2018-05-15 NOTE — Telephone Encounter (Signed)
Copied from [place] [id number]. Topic: Quick Communication - Rx Refill/Question >> May 15, 2018 10:59 AM Selinda Flavin B, NT wrote: Medication: levothyroxine (SYNTHROID, LEVOTHROID) 150 MCG tablet  Has the patient contacted their pharmacy? Yes.   (Agent: If no, request that the patient contact the pharmacy for the refill.) (Agent: If yes, when and what did the pharmacy advise?)  Preferred Pharmacy (with phone number or street name): [facility] [place], [address] AT [address]: Please be advised that RX refills may take up to 3 business days. We ask that you follow-up with your pharmacy.

## 2018-05-15 NOTE — Telephone Encounter (Signed)
Requested medication (s) are due for refill today: yes  Requested medication (s) are on the active medication list: yes  Last refill:  02/06/18 #90  Future visit scheduled: No  Notes to clinic:  Unable to refill per protocol. Pt due for f/u and repeat TSH level. Last appt scheduled in 02/2018 was a no show appt. LOV with Dr. Martinique on 01/30/18    Requested Prescriptions  Pending Prescriptions Disp Refills   levothyroxine (SYNTHROID, LEVOTHROID) 150 MCG tablet 90 tablet 0    Sig: TAKE 1 TABLET BY MOUTH EVERY MORNING ON AN EMPTY STOMACH     Endocrinology:  Hypothyroid Agents Failed - 05/15/2018 12:10 PM      Failed - TSH needs to be rechecked within 3 months after an abnormal result. Refill until TSH is due.      Failed - TSH in normal range and within 360 days    TSH  Date Value Ref Range Status  07/26/2017 6.14 (H) 0.35 - 4.50 uIU/mL Final         Passed - Valid encounter within last 12 months    Recent Outpatient Visits          3 months ago Dyslipidemia   Therapist, music at [facility], Malka So, MD   4 months ago Essential hypertension   Therapist, [facility] at [facility], Doretha Sou, MD   7 months ago Essential hypertension   Therapist, music at [facility], Doretha Sou, MD   9 months ago Diabetes mellitus without complication [facility])   [place] [facility] [place], [place]   1 year ago Swelling of right index finger   Therapist, music at [facility], Doretha Sou, MD

## 2018-05-16 MED ORDER — LEVOTHYROXINE SODIUM 150 MCG PO TABS: ORAL_TABLET | ORAL | 0 refills | Status: DC

## 2018-05-18 ENCOUNTER — Telehealth: Payer: Self-pay | Admitting: *Deleted

## 2018-05-18 NOTE — Telephone Encounter (Signed)
Copied from [place] [phone]. Topic: Appointment Scheduling - Transfer of Care >> May 18, 2018  9:57 AM Sheran Luz wrote: Pt is requesting to transfer FROM: Dr. Blima Dessert  Pt is requesting to transfer TO: Dr. Alcide Clever  Reason for requested transfer: Patient does not feel like Dr. Martinique is a good fit for her

## 2018-05-18 NOTE — Telephone Encounter (Signed)
Will send to Dr Martinique to okay TOC.

## 2018-05-19 NOTE — Telephone Encounter (Signed)
Please advise Dr Ethelene Hal if okay to [facility]

## 2018-05-19 NOTE — Telephone Encounter (Signed)
Fine with me. BJ 

## 2018-05-19 NOTE — Telephone Encounter (Signed)
Okay to schedule patient. 

## 2018-05-19 NOTE — Telephone Encounter (Signed)
okay

## 2018-05-24 NOTE — Telephone Encounter (Signed)
Spoke with patient and she stated that before Dr. Martinique would not continue to prescribe the Phentermine that she has been on for years. She would like for Dr. Martinique to reconsider because she would like for Dr. Martinique to be her PCP.

## 2018-05-24 NOTE — Telephone Encounter (Signed)
Patient called and stated she would like to see if there is common ground she can achieve with Dr. Martinique regarding her phentermine 37.5 MG capsule . She would like to speak with Dr. Martinique or her assistant before she makes her decision. Please reach out to pt. CB#[phone]

## 2018-05-24 NOTE — Telephone Encounter (Signed)
I called and spoke to patient. Patient is unsure at this time if she wants to transfer to [facility] at this time. Patient wants to talk to Dr. Martinique before making a decision to [facility]. Patient will call our office back to schedule appointment when she is ready. Patient thanked me for calling.

## 2018-05-24 NOTE — Telephone Encounter (Signed)
Phentermine is a medication indicated for weight loss and to be used for short period of time (max 12 weeks), it is not indicated for chronic use.  So I do not feel comfortable continuing medication. Thanks, BJ

## 2018-05-26 NOTE — Telephone Encounter (Signed)
Pt called back and was given Dr. Doug Sou message.

## 2018-05-26 NOTE — Telephone Encounter (Signed)
Left message to return call to clinic. PEC may relay message to patient if she call back. CRM created.

## 2018-05-26 NOTE — Telephone Encounter (Signed)
Copied from [place] [id number]. Topic: Appointment Scheduling - Transfer of Care >> May 26, 2018  3:26 PM Reyne Dumas L wrote: Pt is requesting to transfer FROM: Dr. Martinique Pt is requesting to transfer TO: Dr. Ethelene Hal Reason for requested transfer: not having a good transition since Dr. Raliegh Ip left.  Send CRM to patient's current PCP (transferring FROM).  ----  Dr Martinique and Dr Ethelene Hal have already agreed to this TOC.  Will send to Dr Bebe Shaggy office to schedule the patient. Will send back to Sarah (Self) Roselie Awkward to schedule this patient.

## 2018-05-29 NOTE — Telephone Encounter (Signed)
Noted. FYI sent to Dr. Jordan for review. 

## 2018-05-29 NOTE — Telephone Encounter (Signed)
I called and spoke to patient. Patient has decided she wants to go ahead and schedule to see Dr. Ethelene Hal. I have scheduled TOC appointment for 06/06/2018 @ 1pm. Patient and patient son Mia Creek aware of appointment.

## 2018-06-06 ENCOUNTER — Encounter: Payer: Self-pay | Admitting: Family Medicine

## 2018-06-06 ENCOUNTER — Ambulatory Visit (INDEPENDENT_AMBULATORY_CARE_PROVIDER_SITE_OTHER): Payer: Medicare Other | Admitting: Family Medicine

## 2018-06-06 VITALS — BP 126/80 | HR 97 | Ht 63.0 in | Wt 172.2 lb

## 2018-06-06 DIAGNOSIS — E039 Hypothyroidism, unspecified: Secondary | ICD-10-CM | POA: Diagnosis not present

## 2018-06-06 DIAGNOSIS — E1165 Type 2 diabetes mellitus with hyperglycemia: Secondary | ICD-10-CM

## 2018-06-06 DIAGNOSIS — R5382 Chronic fatigue, unspecified: Secondary | ICD-10-CM

## 2018-06-06 LAB — CBC
HCT: 43.5 % (ref 36.0–46.0)
Hemoglobin: 14.3 g/dL (ref 12.0–15.0)
MCHC: 33 g/dL (ref 30.0–36.0)
MCV: 91.1 fl (ref 78.0–100.0)
PLATELETS: 325 10*3/uL (ref 150.0–400.0)
RBC: 4.77 Mil/uL (ref 3.87–5.11)
RDW: 15.2 % (ref 11.5–15.5)
WBC: 5.3 10*3/uL (ref 4.0–10.5)

## 2018-06-06 LAB — BASIC METABOLIC PANEL
BUN: 23 mg/dL (ref 6–23)
CALCIUM: 8.7 mg/dL (ref 8.4–10.5)
CO2: 26 mEq/L (ref 19–32)
Chloride: 101 mEq/L (ref 96–112)
Creatinine, Ser: 0.97 mg/dL (ref 0.40–1.20)
GFR: 55.38 mL/min — AB (ref >60.00)
Glucose, Bld: 267 mg/dL — ABNORMAL HIGH (ref 70–99)
Potassium: 5 mEq/L (ref 3.5–5.1)
SODIUM: 136 meq/L (ref 135–145)

## 2018-06-06 LAB — HEMOGLOBIN A1C: Hgb A1c MFr Bld: 13 % — ABNORMAL HIGH (ref 4.6–6.5)

## 2018-06-06 LAB — TSH: TSH: 5.66 u[IU]/mL — AB (ref 0.35–4.50)

## 2018-06-06 MED ORDER — PHENTERMINE HCL 37.5 MG PO CAPS: 37.5000 mg | ORAL_CAPSULE | ORAL | 0 refills | Status: DC | PRN

## 2018-06-06 NOTE — Progress Notes (Addendum)
Established Patient Office Visit  Subjective:  Patient ID: Leslie Benton, female    DOB: May 20, 1940  Age: 78 y.o. MRN: [id number]  CC:  Chief Complaint  Patient presents with  . Establish Care    HPI Leslie Benton presents for establishment of care and follow-up of her diabetes, hypothyroidism and chronic fatigue.  She is requesting a refill on her phentermine.  She tells me she has taken this drug for a number of years and it has helped her with her ongoing fatigue.  Chart review shows that her diabetes has been woefully under control.  She admits noncompliance with most of her medicines save the Glucophage.  It was noted that she had been referred to endocrinology and Lantus was recommended.  Patient admits that she has not been taking it.  She does assure me that she is taking her levothyroxine every morning on a fasting stomach but not necessarily an hour before eating.   Past Medical History:  Diagnosis Date  . Anemia   . Anxiety   . Cervical spondylosis without myelopathy 05/29/2015  . COLONIC POLYPS, HX OF 02/07/2008  . DEGENERATIVE JOINT DISEASE 10/14/2006   03/31/11: Pt states arthritis in her hip.  . Depression   . DIABETES MELLITUS, TYPE II 10/27/2009  . GERD 10/14/2006  . HYPERLIPIDEMIA 10/14/2006  . HYPERTENSION 10/14/2006   off bp meds now  . HYPOTHYROIDISM 10/14/2006  . LOW BACK PAIN 08/04/2007  . Obesity     Past Surgical History:  Procedure Laterality Date  . HEMORRHOID SURGERY    . Left knee arthroscopic  April 2014   Dr. Durward Fortes  . PARTIAL KNEE ARTHROPLASTY Left 09/23/2014   Procedure: LEFT UNICOMPARTMENTAL KNEE;  Surgeon: [name], MD;  Location: [facility];  Service: Orthopedics;  Laterality: Left;  . TONSILLECTOMY    . TUBAL LIGATION      Family History  Problem Relation Age of Onset  . Heart attack Father 78  . Diabetes Father   . Kidney failure Mother   . Cancer Sister        endometrial  . Cancer Brother        Adrenal    Social History    Socioeconomic History  . Marital status: Married    Spouse name: Not on file  . Number of children: 2  . Years of education: college  . Highest education level: Not on file  Occupational History  . Occupation: retired  Scientific laboratory technician  . Financial resource strain: Not on file  . Food insecurity:    Worry: Not on file    Inability: Not on file  . Transportation needs:    Medical: Not on file    Non-medical: Not on file  Tobacco Use  . Smoking status: Never Smoker  . Smokeless tobacco: Never Used  . Tobacco comment: Never Used Tobacco  Substance and Sexual Activity  . Alcohol use: No    Alcohol/week: 0.0 standard drinks  . Drug use: No  . Sexual activity: Not on file  Lifestyle  . Physical activity:    Days per week: Not on file    Minutes per session: Not on file  . Stress: Not on file  Relationships  . Social connections:    Talks on phone: Not on file    Gets together: Not on file    Attends religious service: Not on file    Active member of club or organization: Not on file    Attends meetings of clubs  or organizations: Not on file    Relationship status: Not on file  . Intimate partner violence:    Fear of current or ex partner: Not on file    Emotionally abused: Not on file    Physically abused: Not on file    Forced sexual activity: Not on file  Other Topics Concern  . Not on file  Social History Narrative   Lives with husband and son.     Patient drinks 1-2 cups of caffeine daily.   Patient is left handed.     Outpatient Medications Prior to Visit  Medication Sig Dispense Refill  . acetaminophen (TYLENOL) 500 MG tablet Take 500 mg by mouth every 6 (six) hours as needed for moderate pain or headache.    . Blood Glucose Monitoring Suppl (ACCU-CHEK AVIVA PLUS) W/DEVICE KIT USE AS DIRECTED TO CHECK BLOOD SUGARS 1 kit 0  . busPIRone (BUSPAR) 15 MG tablet Take 1 tablet (15 mg total) by mouth daily. 180 tablet 3  . fluocinonide (LIDEX) 0.05 % external solution  Apply 1 application topically 2 (two) times daily. 60 mL 1  . glipiZIDE (GLUCOTROL XL) 5 MG 24 hr tablet Take 1 tablet (5 mg total) by mouth daily with breakfast. 90 tablet 4  . glucose blood (ACCU-CHEK AVIVA) test strip USE TO CHECK BLOOD SUGAR DAILY AND PRN 100 each 12  . ibuprofen (ADVIL,MOTRIN) 200 MG tablet Take 400 mg by mouth as needed.    . Insulin Glargine (LANTUS SOLOSTAR) 100 UNIT/ML Solostar Pen Inject 20 Units into the skin every morning. And pen needles 1/day 5 pen PRN  . IRON PO Take 1 tablet by mouth daily.    . Lancets (ACCU-CHEK MULTICLIX) lancets USE TO CHECK BLOOD SUGAR DAILY AND PRN 100 each 4  . levothyroxine (SYNTHROID, LEVOTHROID) 150 MCG tablet TAKE 1 TABLET BY MOUTH EVERY MORNING ON AN EMPTY STOMACH 90 tablet 0  . lisinopril (PRINIVIL,ZESTRIL) 20 MG tablet Take 1 tablet (20 mg total) by mouth daily. 90 tablet 3  . loratadine (CLARITIN) 10 MG tablet Take 10 mg by mouth daily.    . mesalamine (CANASA) 1000 MG suppository as needed.   3  . metFORMIN (GLUCOPHAGE) 1000 MG tablet Take 1 tablet (1,000 mg total) by mouth 2 (two) times daily with a meal. 180 tablet 1  . Multiple Vitamins-Minerals (MULTI-VITAMIN GUMMIES PO) Take 2 each by mouth daily.    . pioglitazone (ACTOS) 30 MG tablet TAKE 1 TABLET BY MOUTH EVERY DAY 90 tablet 0  . propranolol (INDERAL) 20 MG tablet Take 1 tablet (20 mg total) by mouth 2 (two) times daily. (Patient taking differently: Take 20 mg by mouth daily. ) 180 tablet 3  . simvastatin (ZOCOR) 20 MG tablet Take 1 tablet (20 mg total) by mouth at bedtime. 90 tablet 3  . phentermine 37.5 MG capsule Take 1 capsule (37.5 mg total) by mouth every morning. (Patient taking differently: Take 37.5 mg by mouth as needed. ) 30 capsule 0   No facility-administered medications prior to visit.     Allergies  Allergen Reactions  . Niacin And Related Hives and Swelling    No anaphalaxis, but strong reactions.  . Lactose Intolerance (Gi)     "bad taste in mouth"    . Amoxicillin Swelling    hives  . Lactase   . Penicillins Swelling    Hives With all cillins    ROS Review of Systems  Constitutional: Positive for fatigue. Negative for diaphoresis, fever and unexpected weight  change.  HENT: Negative.   Eyes: Positive for visual disturbance.  Respiratory: Negative.   Cardiovascular: Negative.   Gastrointestinal: Negative.   Endocrine: Positive for polyuria.  Allergic/Immunologic: Negative for immunocompromised state.  Hematological: Negative.       Objective:    Physical Exam  Constitutional: She is oriented to person, place, and time. She appears well-developed and well-nourished. No distress.  HENT:  Head: Normocephalic and atraumatic.  Right Ear: External ear normal.  Left Ear: External ear normal.  Mouth/Throat: Oropharynx is clear and moist. No oropharyngeal exudate.  Eyes: Conjunctivae are normal. Right eye exhibits no discharge. Left eye exhibits no discharge. No scleral icterus.  Neck: Neck supple. No JVD present. No tracheal deviation present. No thyromegaly present.  Cardiovascular: Normal rate, regular rhythm and normal heart sounds.  Pulmonary/Chest: Effort normal and breath sounds normal. Stridor present.  Musculoskeletal:        General: No edema.  Lymphadenopathy:    She has no cervical adenopathy.  Neurological: She is alert and oriented to person, place, and time.  Skin: Skin is warm and dry. She is not diaphoretic.  Psychiatric: She has a normal mood and affect. Her behavior is normal.    BP 126/80   Pulse 97   Ht 5' 3" (1.6 m)   Wt 172 lb 4 oz (78.1 kg)   SpO2 96%   BMI 30.51 kg/m  Wt Readings from Last 3 Encounters:  06/06/18 172 lb 4 oz (78.1 kg)  01/30/18 176 lb 4 oz (79.9 kg)  01/24/18 177 lb 3.2 oz (80.4 kg)   BP Readings from Last 3 Encounters:  06/06/18 126/80  01/30/18 130/78  01/24/18 (!) 154/100   Guideline developer:  UpToDate (see UpToDate for funding source) Date Released: June  2014  Health Maintenance Due  Topic Date Due  . DEXA SCAN  04/17/2004  . OPHTHALMOLOGY EXAM  05/03/2015    There are no preventive care reminders to display for this patient.  Lab Results  Component Value Date   TSH 5.66 (H) 06/06/2018   Lab Results  Component Value Date   WBC 5.3 06/06/2018   HGB 14.3 06/06/2018   HCT 43.5 06/06/2018   MCV 91.1 06/06/2018   PLT 325.0 06/06/2018   Lab Results  Component Value Date   NA 136 06/06/2018   K 5.0 06/06/2018   CO2 26 06/06/2018   GLUCOSE 267 (H) 06/06/2018   BUN 23 06/06/2018   CREATININE 0.97 06/06/2018   BILITOT 0.3 07/30/2016   ALKPHOS 87 07/30/2016   AST 13 07/30/2016   ALT 15 07/30/2016   PROT 7.2 07/30/2016   ALBUMIN 4.4 07/30/2016   CALCIUM 8.7 06/06/2018   ANIONGAP 10 09/25/2014   GFR 55.38 (L) 06/06/2018   Lab Results  Component Value Date   CHOL 282 (H) 08/05/2015   Lab Results  Component Value Date   HDL 54.10 08/05/2015   Lab Results  Component Value Date   LDLCALC 111 (H) 07/03/2014   Lab Results  Component Value Date   TRIG 307.0 (H) 08/05/2015   Lab Results  Component Value Date   CHOLHDL 5 08/05/2015   Lab Results  Component Value Date   HGBA1C 13.0 (H) 06/06/2018      Assessment & Plan:   Problem List Items Addressed This Visit      Endocrine   Hypothyroidism   Relevant Orders   TSH (Completed)   Uncontrolled type 2 diabetes mellitus with hyperglycemia ([place])   Relevant Orders  Basic metabolic panel (Completed)   Hemoglobin A1c (Completed)   Ambulatory referral to Endocrinology   Ambulatory referral to diabetic education     Other   Chronic fatigue - Primary   Relevant Medications   phentermine 37.5 MG capsule   Other Relevant Orders   CBC (Completed)      Meds ordered this encounter  Medications  . phentermine 37.5 MG capsule    Sig: Take 1 capsule (37.5 mg total) by mouth as needed.    Dispense:  30 capsule    Refill:  0    Pt was suppose to have received a  90 day supply and only received 60.    Follow-up: Return in about 1 month (around 07/05/2018).    Agreed to fill her phentermine for 30 doses on the condition that she follow back up with endocrinology who learned to give herself Lantus.  I believe that most of her fatigue is probably due to hyperglycemia.  Asked her to try to take her levothyroxine on a fasting stomach an hour before eating.  We will follow her closely.

## 2018-06-08 ENCOUNTER — Ambulatory Visit: Payer: Self-pay | Admitting: Family Medicine

## 2018-06-08 ENCOUNTER — Telehealth: Payer: Self-pay | Admitting: Family Medicine

## 2018-06-08 NOTE — Telephone Encounter (Signed)
Noted, thank you

## 2018-06-08 NOTE — Telephone Encounter (Signed)
I returned pt's call and read her Dr. Bebe Shaggy message from 06/07/2018 9:14 am.   She verbalized understanding to f/u with the endocrinology .     I instructed her in the correct way of taking her thyroid medication.    She was not waiting an hour after taking it.   Only 15 minutes.

## 2018-06-08 NOTE — Telephone Encounter (Signed)
Copied from [place] [id number]. Topic: Quick Communication - Lab Results (Clinic Use ONLY) >> Jun 08, 2018 11:25 AM Windy Kalata wrote: Lab results

## 2018-06-12 ENCOUNTER — Telehealth: Payer: Self-pay

## 2018-06-12 NOTE — Addendum Note (Signed)
Addended by: Jon Billings on: 06/12/2018 10:45 AM   Modules accepted: Orders

## 2018-06-12 NOTE — Telephone Encounter (Signed)
Copied from [place] [id number]. Topic: Referral - Question >> Jun 12, 2018  8:32 AM Leslie Benton, NT wrote: Reason for CRM: Pt states she does not want to see a endocrinologist at this time. She states she would like to try diet and exercise at this time instead of insulin. She also states she is unable to drive on [address] where [facility] is. She states that she has not found a diet and exercise program that has worked for her. She would prefer to see a dietician and an exercise program and requesting a referral from Dr. Ethelene Hal.

## 2018-06-12 NOTE — Telephone Encounter (Signed)
done

## 2018-06-13 NOTE — Telephone Encounter (Signed)
I left a message with Michelene Heady for patient to be notified that the referral for the dietician has been placed.

## 2018-06-20 ENCOUNTER — Ambulatory Visit (INDEPENDENT_AMBULATORY_CARE_PROVIDER_SITE_OTHER): Payer: Medicare Other | Admitting: Endocrinology

## 2018-06-20 ENCOUNTER — Other Ambulatory Visit: Payer: Self-pay

## 2018-06-20 ENCOUNTER — Encounter: Payer: Self-pay | Admitting: Endocrinology

## 2018-06-20 VITALS — BP 120/78 | HR 93 | Ht 63.0 in | Wt 170.2 lb

## 2018-06-20 DIAGNOSIS — E1169 Type 2 diabetes mellitus with other specified complication: Secondary | ICD-10-CM | POA: Diagnosis not present

## 2018-06-20 MED ORDER — GLIMEPIRIDE 1 MG PO TABS: 1.0000 mg | ORAL_TABLET | Freq: Every day | ORAL | 3 refills | Status: DC

## 2018-06-20 NOTE — Progress Notes (Signed)
Subjective:    Patient ID: Leslie Benton, female    DOB: 08-20-1940, 78 y.o.   MRN: [id number]  HPI Pt returns for f/u of diabetes mellitus: DM type: 2 Dx'ed: 2620 Complications: PN Therapy: 3 oral meds GDM: never DKA: never Severe hypoglycemia: never Pancreatitis: never Pancreatic imaging: never Other: she refuses insulin, but she has learned how; Pt lives with husband, who is in poor health; She does not check cbg's; due to memory loss, she cannot take multiple daily injections.  Interval history: Pt stopped insulin, as she says it caused diffuse swelling, fatigue, and weight gain.  Pt says this office is too far from her home to f/u here any more.  Past Medical History:  Diagnosis Date  . Anemia   . Anxiety   . Cervical spondylosis without myelopathy 05/29/2015  . COLONIC POLYPS, HX OF 02/07/2008  . DEGENERATIVE JOINT DISEASE 10/14/2006   03/31/11: Pt states arthritis in her hip.  . Depression   . DIABETES MELLITUS, TYPE II 10/27/2009  . GERD 10/14/2006  . HYPERLIPIDEMIA 10/14/2006  . HYPERTENSION 10/14/2006   off bp meds now  . HYPOTHYROIDISM 10/14/2006  . LOW BACK PAIN 08/04/2007  . Obesity     Past Surgical History:  Procedure Laterality Date  . HEMORRHOID SURGERY    . Left knee arthroscopic  April 2014   Dr. Durward Fortes  . PARTIAL KNEE ARTHROPLASTY Left 09/23/2014   Procedure: LEFT UNICOMPARTMENTAL KNEE;  Surgeon: [name], MD;  Location: [facility];  Service: Orthopedics;  Laterality: Left;  . TONSILLECTOMY    . TUBAL LIGATION      Social History   Socioeconomic History  . Marital status: Married    Spouse name: Not on file  . Number of children: 2  . Years of education: college  . Highest education level: Not on file  Occupational History  . Occupation: retired  Scientific laboratory technician  . Financial resource strain: Not on file  . Food insecurity:    Worry: Not on file    Inability: Not on file  . Transportation needs:    Medical: Not on file    Non-medical: Not on  file  Tobacco Use  . Smoking status: Never Smoker  . Smokeless tobacco: Never Used  . Tobacco comment: Never Used Tobacco  Substance and Sexual Activity  . Alcohol use: No    Alcohol/week: 0.0 standard drinks  . Drug use: No  . Sexual activity: Not on file  Lifestyle  . Physical activity:    Days per week: Not on file    Minutes per session: Not on file  . Stress: Not on file  Relationships  . Social connections:    Talks on phone: Not on file    Gets together: Not on file    Attends religious service: Not on file    Active member of club or organization: Not on file    Attends meetings of clubs or organizations: Not on file    Relationship status: Not on file  . Intimate partner violence:    Fear of current or ex partner: Not on file    Emotionally abused: Not on file    Physically abused: Not on file    Forced sexual activity: Not on file  Other Topics Concern  . Not on file  Social History Narrative   Lives with husband and son.     Patient drinks 1-2 cups of caffeine daily.   Patient is left handed.     Current  Outpatient Medications on File Prior to Visit  Medication Sig Dispense Refill  . busPIRone (BUSPAR) 15 MG tablet Take 1 tablet (15 mg total) by mouth daily. 180 tablet 3  . ibuprofen (ADVIL,MOTRIN) 200 MG tablet Take 400 mg by mouth as needed.    . IRON PO Take 1 tablet by mouth daily.    Marland Kitchen levothyroxine (SYNTHROID, LEVOTHROID) 150 MCG tablet TAKE 1 TABLET BY MOUTH EVERY MORNING ON AN EMPTY STOMACH 90 tablet 0  . loratadine (CLARITIN) 10 MG tablet Take 10 mg by mouth daily.    . metFORMIN (GLUCOPHAGE) 1000 MG tablet Take 1 tablet (1,000 mg total) by mouth 2 (two) times daily with a meal. 180 tablet 1  . Multiple Vitamins-Minerals (MULTI-VITAMIN GUMMIES PO) Take 2 each by mouth daily.    . phentermine 37.5 MG capsule Take 1 capsule (37.5 mg total) by mouth as needed. 30 capsule 0  . mesalamine (CANASA) 1000 MG suppository as needed.   3  . propranolol  (INDERAL) 20 MG tablet Take 1 tablet (20 mg total) by mouth 2 (two) times daily. (Patient not taking: Reported on 06/20/2018) 180 tablet 3   No current facility-administered medications on file prior to visit.     Allergies  Allergen Reactions  . Niacin And Related Hives and Swelling    No anaphalaxis, but strong reactions.  . Lactose Intolerance (Gi)     "bad taste in mouth"  . Amoxicillin Swelling    hives  . Lactase   . Penicillins Swelling    Hives With all cillins    Family History  Problem Relation Age of Onset  . Heart attack Father 42  . Diabetes Father   . Kidney failure Mother   . Cancer Sister        endometrial  . Cancer Brother        Adrenal    BP 120/78 (BP Location: Left Arm, Patient Position: Sitting, Cuff Size: Normal)   Pulse 93   Ht 5\' 3"  (1.6 m)   Wt 170 lb 3.2 oz (77.2 kg)   SpO2 94%   BMI 30.15 kg/m   Review of Systems She has lost 7 lbs.      Objective:   Physical Exam VITAL SIGNS:  See vs page GENERAL: no distress Pulses: dorsalis pedis intact bilat.   MSK: no deformity of the feet CV: trace bilat leg edema Skin:  no ulcer on the feet.  normal color and temp on the feet. Neuro: sensation is intact to touch on the feet.    Lab Results  Component Value Date   CREATININE 0.97 06/06/2018   BUN 23 06/06/2018   NA 136 06/06/2018   K 5.0 06/06/2018   CL 101 06/06/2018   CO2 26 06/06/2018   Lab Results  Component Value Date   HGBA1C 13.0 (H) 06/06/2018      Assessment & Plan:  Type 2 DM, wth PN: very poor glycemic control.   Noncompliance with cbg's and insulin.  We discussed.  she refuses insulin.  She is advised of risks. She wants to follow up with Dr [place], and return here PRN.  She wants to max out oral rx memory loss: I told her she would do well with QD insulin, but she declines.  Patient Instructions  check your blood sugar twice a day.  vary the time of day when you check, between before the 3 meals, and at bedtime.  also  check if you have symptoms of your blood sugar  being too high or too low.  please keep a record of the readings and bring it to your next appointment here (or you can bring the meter itself).  You can write it on any piece of paper.  please call us sooner if your blood sugar goes below 70, or if you have a lot of readings over 200. I have sent a prescription to your pharmacy, to add "glimepiride," and: Please continue the same metformin. I would be happy to see you back here as needed.

## 2018-06-20 NOTE — Patient Instructions (Addendum)
check your blood sugar twice a day.  vary the time of day when you check, between before the 3 meals, and at bedtime.  also check if you have symptoms of your blood sugar being too high or too low.  please keep a record of the readings and bring it to your next appointment here (or you can bring the meter itself).  You can write it on any piece of paper.  please call us sooner if your blood sugar goes below 70, or if you have a lot of readings over 200. I have sent a prescription to your pharmacy, to add "glimepiride," and: Please continue the same metformin. I would be happy to see you back here as needed.

## 2018-06-21 ENCOUNTER — Telehealth: Payer: Self-pay

## 2018-06-21 DIAGNOSIS — M159 Polyosteoarthritis, unspecified: Secondary | ICD-10-CM

## 2018-06-21 DIAGNOSIS — M150 Primary generalized (osteo)arthritis: Principal | ICD-10-CM

## 2018-06-21 DIAGNOSIS — E1165 Type 2 diabetes mellitus with hyperglycemia: Secondary | ICD-10-CM

## 2018-06-21 NOTE — Telephone Encounter (Signed)
Okay to place referral for physical therapy at [place] at [facility]?      Copied from [place] [phone]. Topic: Referral - Request for Referral >> Jun 21, 2018 12:38 PM Leslie Benton wrote: Has patient seen PCP for this complaint? Yes.   *If NO, is insurance requiring patient see PCP for this issue before PCP can refer them? Referral for which specialty: physical therapy and diet Preferred provider/office: [place] [facility] [place] (dietician) [phone] Reason for referral: needs help with diet and exercise  Please call patient once referral is placed.

## 2018-06-22 NOTE — Telephone Encounter (Signed)
I called and spoke with patient. She is aware that the referrals have been placed.

## 2018-06-22 NOTE — Telephone Encounter (Signed)
Okay 

## 2018-06-22 NOTE — Addendum Note (Signed)
Addended by: Nathanial Millman E on: 06/22/2018 11:01 AM   Modules accepted: Orders

## 2018-07-02 ENCOUNTER — Other Ambulatory Visit: Payer: Self-pay | Admitting: Internal Medicine

## 2018-07-04 ENCOUNTER — Ambulatory Visit (INDEPENDENT_AMBULATORY_CARE_PROVIDER_SITE_OTHER): Payer: Medicare Other | Admitting: Family Medicine

## 2018-07-04 ENCOUNTER — Other Ambulatory Visit: Payer: Self-pay

## 2018-07-04 ENCOUNTER — Other Ambulatory Visit: Payer: Self-pay | Admitting: Family Medicine

## 2018-07-04 ENCOUNTER — Encounter: Payer: Self-pay | Admitting: Family Medicine

## 2018-07-04 VITALS — BP 130/80 | HR 76 | Ht 63.0 in | Wt 171.0 lb

## 2018-07-04 DIAGNOSIS — F419 Anxiety disorder, unspecified: Secondary | ICD-10-CM

## 2018-07-04 DIAGNOSIS — R5382 Chronic fatigue, unspecified: Secondary | ICD-10-CM

## 2018-07-04 DIAGNOSIS — E039 Hypothyroidism, unspecified: Secondary | ICD-10-CM

## 2018-07-04 DIAGNOSIS — F1520 Other stimulant dependence, uncomplicated: Secondary | ICD-10-CM | POA: Diagnosis not present

## 2018-07-04 DIAGNOSIS — E1165 Type 2 diabetes mellitus with hyperglycemia: Secondary | ICD-10-CM | POA: Diagnosis not present

## 2018-07-04 DIAGNOSIS — I10 Essential (primary) hypertension: Secondary | ICD-10-CM

## 2018-07-04 MED ORDER — PROPRANOLOL HCL 20 MG PO TABS: 20.0000 mg | ORAL_TABLET | Freq: Two times a day (BID) | ORAL | 3 refills | Status: DC

## 2018-07-04 MED ORDER — BUSPIRONE HCL 15 MG PO TABS: 15.0000 mg | ORAL_TABLET | Freq: Every day | ORAL | 3 refills | Status: DC

## 2018-07-04 MED ORDER — LEVOTHYROXINE SODIUM 150 MCG PO TABS: ORAL_TABLET | ORAL | 0 refills | Status: DC

## 2018-07-04 MED ORDER — PHENTERMINE HCL 37.5 MG PO CAPS: 37.5000 mg | ORAL_CAPSULE | ORAL | 0 refills | Status: DC | PRN

## 2018-07-04 MED ORDER — METFORMIN HCL 1000 MG PO TABS: 1000.0000 mg | ORAL_TABLET | Freq: Two times a day (BID) | ORAL | 1 refills | Status: DC

## 2018-07-04 NOTE — Telephone Encounter (Signed)
Copied from [place] [phone]. Topic: Quick Communication - Rx Refill/Question >> Jul 04, 2018 10:38 AM Virl Axe D wrote: Medication: metFORMIN (GLUCOPHAGE) 1000 MG tablet   Has the patient contacted their pharmacy? Yes.   (Agent: If no, request that the patient contact the pharmacy for the refill.) (Agent: If yes, when and what did the pharmacy advise?)  Preferred Pharmacy (with phone number or street name): [id number] - [place], [place] [address] AT [place] [phone] (Phone) [phone] (Fax)    Agent: Please be advised that RX refills may take up to 3 business days. We ask that you follow-up with your pharmacy.

## 2018-07-04 NOTE — Telephone Encounter (Signed)
Rx sent 

## 2018-07-04 NOTE — Progress Notes (Signed)
Established Patient Office Visit  Subjective:  Patient ID: Leslie Benton, female    DOB: 1940/11/27  Age: 78 y.o. MRN: [id number]  CC:  Chief Complaint  Patient presents with  . Follow-up    HPI Leslie Benton presents for a refill of her phentermine that she tells me that she has been taking since the early 90s for chronic fatigue.  She also takes it to counteract the effects of the diabetes medicines.  Past Medical History:  Diagnosis Date  . Anemia   . Anxiety   . Cervical spondylosis without myelopathy 05/29/2015  . COLONIC POLYPS, HX OF 02/07/2008  . DEGENERATIVE JOINT DISEASE 10/14/2006   03/31/11: Pt states arthritis in her hip.  . Depression   . DIABETES MELLITUS, TYPE II 10/27/2009  . GERD 10/14/2006  . HYPERLIPIDEMIA 10/14/2006  . HYPERTENSION 10/14/2006   off bp meds now  . HYPOTHYROIDISM 10/14/2006  . LOW BACK PAIN 08/04/2007  . Obesity     Past Surgical History:  Procedure Laterality Date  . HEMORRHOID SURGERY    . Left knee arthroscopic  April 2014   Dr. Durward Fortes  . PARTIAL KNEE ARTHROPLASTY Left 09/23/2014   Procedure: LEFT UNICOMPARTMENTAL KNEE;  Surgeon: [name], MD;  Location: [facility];  Service: Orthopedics;  Laterality: Left;  . TONSILLECTOMY    . TUBAL LIGATION      Family History  Problem Relation Age of Onset  . Heart attack Father 70  . Diabetes Father   . Kidney failure Mother   . Cancer Sister        endometrial  . Cancer Brother        Adrenal    Social History   Socioeconomic History  . Marital status: Married    Spouse name: Not on file  . Number of children: 2  . Years of education: college  . Highest education level: Not on file  Occupational History  . Occupation: retired  Scientific laboratory technician  . Financial resource strain: Not on file  . Food insecurity:    Worry: Not on file    Inability: Not on file  . Transportation needs:    Medical: Not on file    Non-medical: Not on file  Tobacco Use  . Smoking status: Never Smoker   . Smokeless tobacco: Never Used  . Tobacco comment: Never Used Tobacco  Substance and Sexual Activity  . Alcohol use: No    Alcohol/week: 0.0 standard drinks  . Drug use: No  . Sexual activity: Not on file  Lifestyle  . Physical activity:    Days per week: Not on file    Minutes per session: Not on file  . Stress: Not on file  Relationships  . Social connections:    Talks on phone: Not on file    Gets together: Not on file    Attends religious service: Not on file    Active member of club or organization: Not on file    Attends meetings of clubs or organizations: Not on file    Relationship status: Not on file  . Intimate partner violence:    Fear of current or ex partner: Not on file    Emotionally abused: Not on file    Physically abused: Not on file    Forced sexual activity: Not on file  Other Topics Concern  . Not on file  Social History Narrative   Lives with husband and son.     Patient drinks 1-2 cups of caffeine  daily.   Patient is left handed.     Outpatient Medications Prior to Visit  Medication Sig Dispense Refill  . glimepiride (AMARYL) 1 MG tablet Take 1 tablet (1 mg total) by mouth daily with breakfast. 90 tablet 3  . ibuprofen (ADVIL,MOTRIN) 200 MG tablet Take 400 mg by mouth as needed.    . IRON PO Take 1 tablet by mouth daily.    Marland Kitchen loratadine (CLARITIN) 10 MG tablet Take 10 mg by mouth daily.    . mesalamine (CANASA) 1000 MG suppository as needed.   3  . metFORMIN (GLUCOPHAGE) 1000 MG tablet Take 1 tablet (1,000 mg total) by mouth 2 (two) times daily with a meal. 180 tablet 1  . Multiple Vitamins-Minerals (MULTI-VITAMIN GUMMIES PO) Take 2 each by mouth daily.    . busPIRone (BUSPAR) 15 MG tablet Take 1 tablet (15 mg total) by mouth daily. 180 tablet 3  . levothyroxine (SYNTHROID, LEVOTHROID) 150 MCG tablet TAKE 1 TABLET BY MOUTH EVERY MORNING ON AN EMPTY STOMACH 90 tablet 0  . phentermine 37.5 MG capsule Take 1 capsule (37.5 mg total) by mouth as needed.  30 capsule 0  . propranolol (INDERAL) 20 MG tablet Take 1 tablet (20 mg total) by mouth 2 (two) times daily. 180 tablet 3   No facility-administered medications prior to visit.     Allergies  Allergen Reactions  . Niacin And Related Hives and Swelling    No anaphalaxis, but strong reactions.  . Lactose Intolerance (Gi)     "bad taste in mouth"  . Amoxicillin Swelling    hives  . Lactase   . Penicillins Swelling    Hives With all cillins    ROS Review of Systems  Constitutional: Negative for chills, diaphoresis, fatigue, fever and unexpected weight change.  HENT: Negative.   Eyes: Negative.   Respiratory: Negative.   Cardiovascular: Negative.   Gastrointestinal: Negative.   Endocrine: Negative for polyphagia and polyuria.  Genitourinary: Negative.   Musculoskeletal: Positive for gait problem. Negative for joint swelling.  Skin: Negative for pallor.  Allergic/Immunologic: Negative for immunocompromised state.  Neurological: Positive for light-headedness.  Hematological: Negative.   Psychiatric/Behavioral: The patient is nervous/anxious.       Objective:    Physical Exam  Constitutional: She is oriented to person, place, and time. She appears well-developed and well-nourished. No distress.  HENT:  Head: Normocephalic and atraumatic.  Right Ear: External ear normal.  Left Ear: External ear normal.  Mouth/Throat: Oropharynx is clear and moist. No oropharyngeal exudate.  Eyes: Pupils are equal, round, and reactive to light. Conjunctivae are normal. Right eye exhibits no discharge. Left eye exhibits no discharge. No scleral icterus.  Neck: No JVD present. No tracheal deviation present.  Pulmonary/Chest: Effort normal. No stridor.  Neurological: She is alert and oriented to person, place, and time.  Skin: Skin is warm and dry. She is not diaphoretic.  Psychiatric: She has a normal mood and affect. Her behavior is normal.    BP 130/80   Pulse 76   Ht 5\' 3"  (1.6 m)    Wt 171 lb (77.6 kg)   SpO2 97%   BMI 30.29 kg/m  Wt Readings from Last 3 Encounters:  07/04/18 171 lb (77.6 kg)  06/20/18 170 lb 3.2 oz (77.2 kg)  06/06/18 172 lb 4 oz (78.1 kg)   BP Readings from Last 3 Encounters:  07/04/18 130/80  06/20/18 120/78  06/06/18 126/80   Guideline developer:  UpToDate (see UpToDate for funding source) Date  Released: June 2014  Health Maintenance Due  Topic Date Due  . DEXA SCAN  04/17/2004  . OPHTHALMOLOGY EXAM  05/03/2015  . URINE MICROALBUMIN  08/04/2016    There are no preventive care reminders to display for this patient.  Lab Results  Component Value Date   TSH 5.66 (H) 06/06/2018   Lab Results  Component Value Date   WBC 5.3 06/06/2018   HGB 14.3 06/06/2018   HCT 43.5 06/06/2018   MCV 91.1 06/06/2018   PLT 325.0 06/06/2018   Lab Results  Component Value Date   NA 136 06/06/2018   K 5.0 06/06/2018   CO2 26 06/06/2018   GLUCOSE 267 (H) 06/06/2018   BUN 23 06/06/2018   CREATININE 0.97 06/06/2018   BILITOT 0.3 07/30/2016   ALKPHOS 87 07/30/2016   AST 13 07/30/2016   ALT 15 07/30/2016   PROT 7.2 07/30/2016   ALBUMIN 4.4 07/30/2016   CALCIUM 8.7 06/06/2018   ANIONGAP 10 09/25/2014   GFR 55.38 (L) 06/06/2018   Lab Results  Component Value Date   CHOL 282 (H) 08/05/2015   Lab Results  Component Value Date   HDL 54.10 08/05/2015   Lab Results  Component Value Date   LDLCALC 111 (H) 07/03/2014   Lab Results  Component Value Date   TRIG 307.0 (H) 08/05/2015   Lab Results  Component Value Date   CHOLHDL 5 08/05/2015   Lab Results  Component Value Date   HGBA1C 13.0 (H) 06/06/2018      Assessment & Plan:   Problem List Items Addressed This Visit      Cardiovascular and Mediastinum   Essential hypertension   Relevant Medications   propranolol (INDERAL) 20 MG tablet     Endocrine   Hypothyroidism   Relevant Medications   levothyroxine (SYNTHROID, LEVOTHROID) 150 MCG tablet   propranolol (INDERAL) 20  MG tablet   Uncontrolled type 2 diabetes mellitus with hyperglycemia ([place])   Relevant Orders   Ambulatory referral to Endocrinology     Other   Chronic fatigue   Relevant Medications   phentermine 37.5 MG capsule   Anxiety   Relevant Medications   busPIRone (BUSPAR) 15 MG tablet    Other Visit Diagnoses    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 busPIRone (BUSPAR) 15 MG tablet    Sig: Take 1 tablet (15 mg total) by mouth daily.    Dispense:  180 tablet    Refill:  3  . levothyroxine (SYNTHROID, LEVOTHROID) 150 MCG tablet    Sig: TAKE 1 TABLET BY MOUTH EVERY MORNING ON AN EMPTY STOMACH    Dispense:  90 tablet    Refill:  0  . phentermine 37.5 MG capsule    Sig: Take 1 capsule (37.5 mg total) by mouth as needed.    Dispense:  90 capsule    Refill:  0    Pt was suppose to have received a 90 day supply and only received 60.  . propranolol (INDERAL) 20 MG tablet    Sig: Take 1 tablet (20 mg total) by mouth 2 (two) times daily.    Dispense:  180 tablet    Refill:  3    Follow-up: Return in about 3 months (around 10/04/2018).   Patient was seen with her son Leslie Benton in the room.  I explained the situation to him that her diabetes is far out of control  and will need insulin therapy.  He will go to her next appointment with Dr. Ebony Hail along with her nurse.  Discussed that patient has amphetamine dependent and I do not think it would be a good idea to remove this medication.

## 2018-07-05 DIAGNOSIS — M25521 Pain in right elbow: Secondary | ICD-10-CM | POA: Diagnosis not present

## 2018-07-05 DIAGNOSIS — M7711 Lateral epicondylitis, right elbow: Secondary | ICD-10-CM | POA: Diagnosis not present

## 2018-07-13 ENCOUNTER — Ambulatory Visit: Payer: Medicare Other | Admitting: Physical Therapy

## 2018-07-24 ENCOUNTER — Ambulatory Visit (INDEPENDENT_AMBULATORY_CARE_PROVIDER_SITE_OTHER): Payer: Medicare Other | Admitting: Family Medicine

## 2018-07-24 ENCOUNTER — Telehealth: Payer: Self-pay | Admitting: Family Medicine

## 2018-07-24 ENCOUNTER — Encounter: Payer: Self-pay | Admitting: Family Medicine

## 2018-07-24 VITALS — Ht 63.0 in

## 2018-07-24 DIAGNOSIS — R0982 Postnasal drip: Secondary | ICD-10-CM | POA: Diagnosis not present

## 2018-07-24 MED ORDER — FLUTICASONE PROPIONATE 50 MCG/ACT NA SUSP: 2.0000 | Freq: Every day | NASAL | 2 refills | Status: DC

## 2018-07-24 NOTE — Telephone Encounter (Signed)
Let's see if we can get her scheduled for a WebEx

## 2018-07-24 NOTE — Progress Notes (Signed)
Established Patient Office Visit  Subjective:  Patient ID: Leslie Benton, female    DOB: [birth year]  Age: 78 y.o. MRN: [id number]  CC:  Chief Complaint  Patient presents with  . swollen lymph nodes    was working in the yard, thinks she my have inhaled a bunch of pollen. no fever that patient is aware of, wanted to catch it before it moves up into her ears.     HPI Leslie Benton presents for patient developed postnasal drip and a scratchy throat last evening status post working outside in her yard yesterday.  She denies fevers chills nausea or vomiting or any significant cough.  She is concerned about her ears stopping up.  Tells me that her former primary care doctor would send in doxycycline for this issue.  The home health aide is helping [place] communicate together.  Past Medical History:  Diagnosis Date  . Anemia   . Anxiety   . Cervical spondylosis without myelopathy 05/29/2015  . COLONIC POLYPS, HX OF 02/07/2008  . DEGENERATIVE JOINT DISEASE 10/14/2006   03/31/11: Pt states arthritis in her hip.  . Depression   . DIABETES MELLITUS, TYPE II 10/27/2009  . GERD 10/14/2006  . HYPERLIPIDEMIA 10/14/2006  . HYPERTENSION 10/14/2006   off bp meds now  . HYPOTHYROIDISM 10/14/2006  . LOW BACK PAIN 08/04/2007  . Obesity     Past Surgical History:  Procedure Laterality Date  . HEMORRHOID SURGERY    . Left knee arthroscopic  April 2014   Dr. Durward Fortes  . PARTIAL KNEE ARTHROPLASTY Left 09/23/2014   Procedure: LEFT UNICOMPARTMENTAL KNEE;  Surgeon: [name], MD;  Location: [facility];  Service: Orthopedics;  Laterality: Left;  . TONSILLECTOMY    . TUBAL LIGATION      Family History  Problem Relation Age of Onset  . Heart attack Father 61  . Diabetes Father   . Kidney failure Mother   . Cancer Sister        endometrial  . Cancer Brother        Adrenal    Social History   Socioeconomic History  . Marital status: Married    Spouse name: Not on file  . Number of children: 2  .  Years of education: college  . Highest education level: Not on file  Occupational History  . Occupation: retired  Scientific laboratory technician  . Financial resource strain: Not on file  . Food insecurity:    Worry: Not on file    Inability: Not on file  . Transportation needs:    Medical: Not on file    Non-medical: Not on file  Tobacco Use  . Smoking status: Never Smoker  . Smokeless tobacco: Never Used  . Tobacco comment: Never Used Tobacco  Substance and Sexual Activity  . Alcohol use: No    Alcohol/week: 0.0 standard drinks  . Drug use: No  . Sexual activity: Not on file  Lifestyle  . Physical activity:    Days per week: Not on file    Minutes per session: Not on file  . Stress: Not on file  Relationships  . Social connections:    Talks on phone: Not on file    Gets together: Not on file    Attends religious service: Not on file    Active member of club or organization: Not on file    Attends meetings of clubs or organizations: Not on file    Relationship status: Not on file  . Intimate  partner violence:    Fear of current or ex partner: Not on file    Emotionally abused: Not on file    Physically abused: Not on file    Forced sexual activity: Not on file  Other Topics Concern  . Not on file  Social History Narrative   Lives with husband and son.     Patient drinks 1-2 cups of caffeine daily.   Patient is left handed.     Outpatient Medications Prior to Visit  Medication Sig Dispense Refill  . busPIRone (BUSPAR) 15 MG tablet Take 1 tablet (15 mg total) by mouth daily. 180 tablet 3  . glimepiride (AMARYL) 1 MG tablet Take 1 tablet (1 mg total) by mouth daily with breakfast. 90 tablet 3  . ibuprofen (ADVIL,MOTRIN) 200 MG tablet Take 400 mg by mouth as needed.    . IRON PO Take 1 tablet by mouth daily.    Marland Kitchen levothyroxine (SYNTHROID, LEVOTHROID) 150 MCG tablet TAKE 1 TABLET BY MOUTH EVERY MORNING ON AN EMPTY STOMACH 90 tablet 0  . loratadine (CLARITIN) 10 MG tablet Take 10 mg by  mouth daily.    . mesalamine (CANASA) 1000 MG suppository as needed.   3  . metFORMIN (GLUCOPHAGE) 1000 MG tablet Take 1 tablet (1,000 mg total) by mouth 2 (two) times daily with a meal. 180 tablet 1  . Multiple Vitamins-Minerals (MULTI-VITAMIN GUMMIES PO) Take 2 each by mouth daily.    . phentermine 37.5 MG capsule Take 1 capsule (37.5 mg total) by mouth as needed. 90 capsule 0  . propranolol (INDERAL) 20 MG tablet Take 1 tablet (20 mg total) by mouth 2 (two) times daily. 180 tablet 3   No facility-administered medications prior to visit.     Allergies  Allergen Reactions  . Niacin And Related Hives and Swelling    No anaphalaxis, but strong reactions.  . Lactose Intolerance (Gi)     "bad taste in mouth"  . Amoxicillin Swelling    hives  . Lactase   . Penicillins Swelling    Hives With all cillins    ROS Review of Systems  Constitutional: Negative for diaphoresis, fatigue, fever and unexpected weight change.  HENT: Positive for congestion and postnasal drip. Negative for rhinorrhea, sinus pressure, sinus pain, sore throat, trouble swallowing and voice change.   Eyes: Negative for photophobia and visual disturbance.  Respiratory: Negative for cough, shortness of breath and wheezing.   Cardiovascular: Negative.   Gastrointestinal: Negative for nausea and vomiting.  Neurological: Negative for headaches.  Hematological: Does not bruise/bleed easily.  Psychiatric/Behavioral: Negative.       Objective:    Physical Exam  Constitutional: She is oriented to person, place, and time. No distress.  Pulmonary/Chest: Effort normal.  Neurological: She is alert and oriented to person, place, and time.  Psychiatric: She has a normal mood and affect. Her behavior is normal.    Ht 5\' 3"  (1.6 m)   BMI 30.29 kg/m  Wt Readings from Last 3 Encounters:  07/04/18 171 lb (77.6 kg)  06/20/18 170 lb 3.2 oz (77.2 kg)  06/06/18 172 lb 4 oz (78.1 kg)     Health Maintenance Due  Topic Date  Due  . DEXA SCAN  04/17/2004  . OPHTHALMOLOGY EXAM  05/03/2015  . URINE MICROALBUMIN  08/04/2016    There are no preventive care reminders to display for this patient.  Lab Results  Component Value Date   TSH 5.66 (H) 06/06/2018   Lab Results  Component  Value Date   WBC 5.3 06/06/2018   HGB 14.3 06/06/2018   HCT 43.5 06/06/2018   MCV 91.1 06/06/2018   PLT 325.0 06/06/2018   Lab Results  Component Value Date   NA 136 06/06/2018   K 5.0 06/06/2018   CO2 26 06/06/2018   GLUCOSE 267 (H) 06/06/2018   BUN 23 06/06/2018   CREATININE 0.97 06/06/2018   BILITOT 0.3 07/30/2016   ALKPHOS 87 07/30/2016   AST 13 07/30/2016   ALT 15 07/30/2016   PROT 7.2 07/30/2016   ALBUMIN 4.4 07/30/2016   CALCIUM 8.7 06/06/2018   ANIONGAP 10 09/25/2014   GFR 55.38 (L) 06/06/2018   Lab Results  Component Value Date   CHOL 282 (H) 08/05/2015   Lab Results  Component Value Date   HDL 54.10 08/05/2015   Lab Results  Component Value Date   LDLCALC 111 (H) 07/03/2014   Lab Results  Component Value Date   TRIG 307.0 (H) 08/05/2015   Lab Results  Component Value Date   CHOLHDL 5 08/05/2015   Lab Results  Component Value Date   HGBA1C 13.0 (H) 06/06/2018      Assessment & Plan:   Problem List Items Addressed This Visit      Other   Post-nasal drip - Primary   Relevant Medications   fluticasone (FLONASE) 50 MCG/ACT nasal spray      Meds ordered this encounter  Medications  . fluticasone (FLONASE) 50 MCG/ACT nasal spray    Sig: Place 2 sprays into both nostrils daily.    Dispense:  16 g    Refill:  2    Follow-up: Return in about 1 week (around 07/31/2018), or if symptoms worsen or fail to improve.    [name], MDVirtual Visit via Telephone Note  I connected with Leslie Benton on 07/24/18 at 10:30 AM EDT by telephone and verified that I am speaking with the correct person using two identifiers.   I discussed the limitations, risks, security and privacy  concerns of performing an evaluation and management service by telephone and the availability of in person appointments. I also discussed with the patient that there may be a patient responsible charge related to this service. The patient expressed understanding and agreed to proceed.  Interactive video and audio telecommunications were attempted between myself and the patient. However they failed due to the patient having technical difficulties or not having access to video capability. We continued and completed with audio only.Interactive video and audio telecommunications were attempted between myself and the patient. However they failed due to the patient having technical difficulties or not having access to video capability. We continued and completed with audio only.   History of Present Illness:    Observations/Objective:   Assessment and Plan:   Follow Up Instructions:    I discussed the assessment and treatment plan with the patient. The patient was provided an opportunity to ask questions and all were answered. The patient agreed with the plan and demonstrated an understanding of the instructions.   The patient was advised to call back or seek an in-person evaluation if the symptoms worsen or if the condition fails to improve as anticipated.    I provided 15 minutes of non-face-to-face time during this encounter.

## 2018-07-24 NOTE — Telephone Encounter (Signed)
Can we get her scheduled for a webex or webex via phone? Thanks!

## 2018-07-24 NOTE — Telephone Encounter (Signed)
Patient said she was out in the garden and must have inhaled a bunch of pollen and said she has swollen lymph nodes. She said in the past her Doctor had prescribed Doxycycline and it seemed to work and she wasn't sure if she could a refill or what she needed to do. She said she would like a call back as soon as possible.

## 2018-07-25 NOTE — Telephone Encounter (Signed)
Was done yesterday

## 2018-07-27 ENCOUNTER — Ambulatory Visit: Payer: Medicare Other | Admitting: Physical Therapy

## 2018-08-14 DIAGNOSIS — K6289 Other specified diseases of anus and rectum: Secondary | ICD-10-CM | POA: Diagnosis not present

## 2018-08-14 DIAGNOSIS — K625 Hemorrhage of anus and rectum: Secondary | ICD-10-CM | POA: Diagnosis not present

## 2018-08-29 ENCOUNTER — Ambulatory Visit: Payer: Medicare Other | Attending: Family Medicine | Admitting: Physical Therapy

## 2018-08-29 ENCOUNTER — Other Ambulatory Visit: Payer: Self-pay

## 2018-08-29 ENCOUNTER — Encounter: Payer: Self-pay | Admitting: Physical Therapy

## 2018-08-29 DIAGNOSIS — G8929 Other chronic pain: Secondary | ICD-10-CM | POA: Diagnosis not present

## 2018-08-29 DIAGNOSIS — M6281 Muscle weakness (generalized): Secondary | ICD-10-CM | POA: Insufficient documentation

## 2018-08-29 DIAGNOSIS — R2681 Unsteadiness on feet: Secondary | ICD-10-CM

## 2018-08-29 DIAGNOSIS — M25561 Pain in right knee: Secondary | ICD-10-CM | POA: Insufficient documentation

## 2018-08-29 NOTE — Therapy (Signed)
[facility] [address], [place], [place], [address] Phone: [phone]   Fax:  [phone]  Physical Therapy Evaluation  Patient Details  Name: Leslie Benton MRN: [id number] Date of Birth: [birth year] Referring Provider (PT): Abelino Derrick, MD   Encounter Date: 08/29/2018  PT End of Session - 08/29/18 1355    Visit Number  1    Date for PT Re-Evaluation  10/16/18    Authorization Type  Medicare A and B    Authorization Time Period  08/29/18 to 10/16/18    Authorization - Visit Number  1    Authorization - Number of Visits  10    PT Start Time  9833    PT Stop Time  1350    PT Time Calculation (min)  45 min    Activity Tolerance  No increased pain;Patient tolerated treatment well    Behavior During Therapy  [place] for tasks assessed/performed       Past Medical History:  Diagnosis Date  . Anemia   . Anxiety   . Cervical spondylosis without myelopathy 05/29/2015  . COLONIC POLYPS, HX OF 02/07/2008  . DEGENERATIVE JOINT DISEASE 10/14/2006   03/31/11: Pt states arthritis in her hip.  . Depression   . DIABETES MELLITUS, TYPE II 10/27/2009  . GERD 10/14/2006  . HYPERLIPIDEMIA 10/14/2006  . HYPERTENSION 10/14/2006   off bp meds now  . HYPOTHYROIDISM 10/14/2006  . LOW BACK PAIN 08/04/2007  . Obesity     Past Surgical History:  Procedure Laterality Date  . HEMORRHOID SURGERY    . Left knee arthroscopic  April 2014   Dr. Durward Fortes  . PARTIAL KNEE ARTHROPLASTY Left 09/23/2014   Procedure: LEFT UNICOMPARTMENTAL KNEE;  Surgeon: [name], MD;  Location: [facility];  Service: Orthopedics;  Laterality: Left;  . TONSILLECTOMY    . TUBAL LIGATION      There were no vitals filed for this visit.   Subjective Assessment - 08/29/18 1307    Subjective  Pt reports that she is having issues with her balance for several years. She has had PT for her Lt knee following a knee replacement, however not specifically for balance.     Pertinent History  Lt TKA (Dr Wynelle Link) 2016    Limitations  Standing;Walking    How long can you stand comfortably?  Has to hold on.  Limited to 3 minutes    How long can you walk comfortably?  up to an hour but very tired    Diagnostic tests  none    Patient Stated Goals  improve balance    Currently in Pain?  No/denies         [facility] PT Assessment - 08/29/18 0001      Assessment   Medical Diagnosis  Osteroarthritis of multiple joints     Referring Provider (PT)  Abelino Derrick, MD    Onset Date/Surgical Date  --   several years   Prior Therapy  previously for knees      Precautions   Precautions  Fall      Restrictions   Weight Bearing Restrictions  No      Balance Screen   Has the patient fallen in the past 6 months  No    Has the patient had a decrease in activity level because of a fear of falling?   Yes    Is the patient reluctant to leave their home because of a fear of falling?   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       Cognition   Overall Cognitive Status  Within Functional Limits for tasks assessed      Posture/Postural Control   Posture Comments  Rt lateral trunk shift      ROM / Strength   AROM / PROM / Strength  Strength      Strength   Strength Assessment Site  Hip;Knee;Ankle    Right/Left Hip  Right;Left    Right Hip Flexion  4/5    Right Hip Extension  4/5    Right Hip ABduction  4/5    Left Hip Extension  4/5    Left Hip ABduction  4/5    Right/Left Knee  --   BLE 5/5   Right/Left Ankle  --   B ankle DF 5/5      Transfers   Five time sit to stand comments   12 sec, no UE       Ambulation/Gait   Gait Comments  short strides, shuffled steps, flexed posture       Standardized Balance Assessment   Standardized Balance  Assessment  Berg Balance Test;Timed Up and Go Test      Berg Balance Test   Sit to Stand  Able to stand without using hands and stabilize independently    Standing Unsupported  Able to stand safely 2 minutes    Sitting with Back Unsupported but Feet Supported on Floor or Stool  Able to sit safely and securely 2 minutes    Stand to Sit  Sits safely with minimal use of hands    Transfers  Able to transfer safely, minor use of hands    Standing Unsupported with Eyes Closed  Able to stand 10 seconds safely    Standing Unsupported with Feet Together  Able to place feet together independently and stand 1 minute safely    From Standing, Reach Forward with Outstretched Arm  Can reach confidently >25 cm (10")    From Standing Position, Pick up Object from Floor  Able to pick up shoe safely and easily    From Standing Position, Turn to Look Behind Over each Shoulder  Looks behind from both sides and weight shifts well    Turn 360 Degrees  Able to turn 360 degrees safely in 4 seconds or less    Standing Unsupported, Alternately Place Feet on Step/Stool  Able to stand independently and complete 8 steps >20 seconds    Standing Unsupported, One Foot in Front  Able to place foot tandem independently and hold 30 seconds    Standing on One Leg  Tries to lift leg/unable to hold 3 seconds but remains standing independently    Total Score  52      Timed Up and Go Test   TUG Comments  15 sec no AD      High Level Balance   High Level Balance Comments  Ascend and descend stairs with Lt handrail going up, BUE support and sideways with Lt step to pattern going down                Objective measurements completed on examination: See above findings.  [facility] PT Treatment/Exercise - 08/29/18 0001      Self-Care   Self-Care  Other Self-Care Comments    Other Self-Care Comments   safe stair negotiation; ways to decrease risk of falls at home              PT Education - 08/29/18 1452     Education Details  eval findings/POC    Person(s) Educated  Patient    Methods  Explanation    Comprehension  Verbalized understanding       PT Short Term Goals - 08/29/18 1444      PT SHORT TERM GOAL #1   Title  be independent in initial HEP    Time  4    Period  Weeks    Status  New    Target Date  09/29/18      PT SHORT TERM GOAL #2   Title  Pt will report having gained information regarding silver sneakers to allow for transition to an independent program without lapse in progress.     Time  4    Period  Weeks    Status  New      PT SHORT TERM GOAL #3   Title  Pt will participate in DGI and appropriate goals set moving forward.    Time  4    Period  Weeks    Status  New        PT Long Term Goals - 08/29/18 1448      PT LONG TERM GOAL #1   Title  Pt will demo independence with her advanced HEP to allow for smooth transition to the gym.    Time  6    Period  Weeks    Status  New    Target Date  10/10/18      PT LONG TERM GOAL #2   Title  Pt will be able to maintain single leg stance for atleast 5 sec on each LE, 2/3 trials, to improve stability with stair negotiation at home.     Time  6    Period  Weeks    Status  New      PT LONG TERM GOAL #3   Title  Pt will complete TUG in less than 14 sec to reflect improvements in dynamic balance and decrease risk of falling in the community.     Time  6    Period  Weeks    Status  New      PT LONG TERM GOAL #4   Title  Pt will report 50% improvement in her strength, balance and stamina with daily activity in the community.     Time  6    Period  Weeks    Status  New             Plan - 08/29/18 1357    Clinical Impression Statement  Pt presents to OPPT with complaints of poor stamina and balance. She has 4/5 MMT of the BLE, and performed well on her 5x sit to stand in less than 14 sec. Pt scored 52/56 on the BERG balance test, placing her at a low risk of falling. Pt has Rt trunk lean which impacts her  steadiness with ambulation, and she requires BUE support when using the stairs in the clinic today. She is highly interested in transitioning into a gym program that she can complete on her own and would benefit from skilled PT to facilitate safe exercise, improve her dynamic  balance and increase her independence with a home program.     Personal Factors and Comorbidities  Age;Comorbidity 3+;Fitness    Comorbidities  osteoarthritis of multiple joints, DMII, low back pain     Examination-Activity Limitations  Stairs;Locomotion Level    Examination-Participation Restrictions  Shop;Yard Work    [employer] officer  Evolving/Moderate complexity    Clinical Decision Making  Moderate    Rehab Potential  Good    PT Frequency  2x / week    PT Duration  6 weeks    PT Treatment/Interventions  ADLs/Self Care Home Management;Stair training;Gait training;Functional mobility training;Therapeutic activities;Therapeutic exercise;Balance training;Patient/family education;Neuromuscular re-education;Manual techniques    PT Next Visit Plan  complete DGI, implement HEP for LE strength and balance progression; f/u on silver sneakers info    PT Home Exercise Plan  next visit    Consulted and Agree with Plan of Care  Patient;Family member/caregiver    Family Member Consulted  Home Aide       Patient will benefit from skilled therapeutic intervention in order to improve the following deficits and impairments:  Decreased activity tolerance, Abnormal gait, Decreased balance, Decreased endurance, Decreased strength, Postural dysfunction  Visit Diagnosis: Unsteadiness on feet  Muscle weakness (generalized)     Problem List Patient Active Problem List   Diagnosis Date Noted  . Post-nasal drip 07/24/2018  . Anxiety 07/04/2018  . Uncontrolled type 2 diabetes mellitus with hyperglycemia ([place]) 06/06/2018  . Chronic fatigue 01/30/2018  . Dyslipidemia 10/17/2017  . Acute eczematoid otitis externa of  left ear 07/26/2017  . Fungal dermatitis 07/26/2017  . Cervical spondylosis without myelopathy 05/29/2015  . Cervicogenic headache 05/29/2015  . Obesity 01/14/2015  . OA (osteoarthritis) of knee 09/23/2014  . Precordial chest pain 08/08/2014  . Anemia 07/02/2014  . Depression 06/08/2012  . Diabetes ([place]) 10/27/2009  . KNEE PAIN, RIGHT 07/15/2008  . COLONIC POLYPS, HX OF 02/07/2008  . LOW BACK PAIN 08/04/2007  . Hypothyroidism 10/14/2006  . Essential hypertension 10/14/2006  . GERD 10/14/2006  . Osteoarthritis 10/14/2006    2:54 PM,08/29/18 Sherol Dade PT, DPT [place] [facility] [place]   [facility] [address], [place], [place], [address] Phone: [phone]   Fax:  [phone]  Name: CHARRISE LARDNER MRN: [id number] Date of Birth: 05-08-1940

## 2018-09-07 ENCOUNTER — Other Ambulatory Visit: Payer: Self-pay

## 2018-09-07 ENCOUNTER — Ambulatory Visit: Payer: Medicare Other | Admitting: Physical Therapy

## 2018-09-07 DIAGNOSIS — M6281 Muscle weakness (generalized): Secondary | ICD-10-CM

## 2018-09-07 DIAGNOSIS — M25561 Pain in right knee: Secondary | ICD-10-CM | POA: Diagnosis not present

## 2018-09-07 DIAGNOSIS — R2681 Unsteadiness on feet: Secondary | ICD-10-CM | POA: Diagnosis not present

## 2018-09-07 DIAGNOSIS — G8929 Other chronic pain: Secondary | ICD-10-CM | POA: Diagnosis not present

## 2018-09-07 NOTE — Patient Instructions (Signed)
Access Code: [email or web address]  URL: [email or web address]  Date: 09/07/2018  Prepared by: Sherol Dade   Exercises  Seated Hip Abduction - 10 reps - 2 sets - 2x daily - 7x weekly  Supine Active Straight Leg Raise - 15 reps - 1 sets - 2x daily - 7x weekly  Supine Bridge - 10 reps - 2 sets - 1x daily - 7x weekly  Goblet Squat with Kettlebell - 15 reps - 2 sets - 1x daily - 7x weekly  Single Leg Heel Raise - 20 reps - 2x daily - 7x weekly    [facility] [address] [place], [place] [address] Phone # [phone] Fax [phone]

## 2018-09-07 NOTE — Therapy (Signed)
[facility] [address], [place], [place], [address] Phone: [phone]   Fax:  [phone]  Physical Therapy Treatment  Patient Details  Name: Leslie Benton MRN: [id number] Date of Birth: 12-12-1940 Referring Provider (PT): Abelino Derrick, MD   Encounter Date: 09/07/2018  PT End of Session - 09/07/18 1332    Visit Number  2    Date for PT Re-Evaluation  10/16/18    Authorization Type  Medicare A and B    Authorization Time Period  08/29/18 to 10/16/18    Authorization - Visit Number  2    Authorization - Number of Visits  10    PT Start Time  8127    PT Stop Time  1342    PT Time Calculation (min)  49 min    Activity Tolerance  No increased pain;Patient tolerated treatment well    Behavior During Therapy  [facility] for tasks assessed/performed       Past Medical History:  Diagnosis Date  . Anemia   . Anxiety   . Cervical spondylosis without myelopathy 05/29/2015  . COLONIC POLYPS, HX OF 02/07/2008  . DEGENERATIVE JOINT DISEASE 10/14/2006   03/31/11: Pt states arthritis in her hip.  . Depression   . DIABETES MELLITUS, TYPE II 10/27/2009  . GERD 10/14/2006  . HYPERLIPIDEMIA 10/14/2006  . HYPERTENSION 10/14/2006   off bp meds now  . HYPOTHYROIDISM 10/14/2006  . LOW BACK PAIN 08/04/2007  . Obesity     Past Surgical History:  Procedure Laterality Date  . HEMORRHOID SURGERY    . Left knee arthroscopic  April 2014   Dr. Durward Fortes  . PARTIAL KNEE ARTHROPLASTY Left 09/23/2014   Procedure: LEFT UNICOMPARTMENTAL KNEE;  Surgeon: [name], MD;  Location: [facility];  Service: Orthopedics;  Laterality: Left;  . TONSILLECTOMY    . TUBAL LIGATION      There were no vitals filed for this visit.  Subjective Assessment - 09/07/18 1255    Subjective  Pt states things are going well. No issues currently.     Pertinent History  Lt TKA (Dr Wynelle Link) 2016    Limitations  Standing;Walking    How long can you stand comfortably?  Has  to hold on.  Limited to 3 minutes    How long can you walk comfortably?  up to an hour but very tired    Diagnostic tests  none    Patient Stated Goals  improve balance    Currently in Pain?  No/denies         [facility] PT Assessment - 09/07/18 0001      Standardized Balance Assessment   Standardized Balance Assessment  Dynamic Gait Index      Dynamic Gait Index   Level Surface  Moderate Impairment    Change in Gait Speed  Mild Impairment    Gait with Horizontal Head Turns  Moderate Impairment    Gait with Vertical Head Turns  Moderate Impairment    Gait and Pivot Turn  Normal    Step Over Obstacle  Mild Impairment    Step Around Obstacles  Mild Impairment    Steps  Mild Impairment    Total Score  14                   [facility] Adult PT Treatment/Exercise - 09/07/18 0001      Exercises   Exercises  Knee/Hip      Knee/Hip Exercises: Standing   Heel Raises  Both;1 set;20 reps      Knee/Hip Exercises: Seated   Clamshell with TheraBand  Green   2x10 reps each    Sit to Sand  2 sets;10 reps   5# goblet squat, 3 sec eccentric lower      Knee/Hip Exercises: Supine   Bridges  Both;2 sets;10 reps    Bridges Limitations  10 sec hold last rep    Straight Leg Raises  Both;1 set;15 reps    Other Supine Knee/Hip Exercises  hip flexion isometric 10x3 sec              PT Education - 09/07/18 1351    Education Details  implemented and reviewed HEP    Person(s) Educated  Patient;Caregiver(s)    Methods  Explanation;Handout    Comprehension  Verbalized understanding;Returned demonstration       PT Short Term Goals - 09/07/18 1353      PT SHORT TERM GOAL #1   Title  be independent in initial HEP    Time  4    Period  Weeks    Status  New    Target Date  09/29/18      PT SHORT TERM GOAL #2   Title  Pt will report having gained information regarding silver sneakers to allow for transition to an independent program without lapse in progress.     Time  4     Period  Weeks    Status  New      PT SHORT TERM GOAL #3   Title  Pt will participate in DGI and appropriate goals set moving forward.    Time  4    Period  Weeks    Status  Achieved        PT Long Term Goals - 09/07/18 1351      PT LONG TERM GOAL #1   Title  Pt will demo independence with her advanced HEP to allow for smooth transition to the gym.    Time  6    Period  Weeks    Status  New      PT LONG TERM GOAL #2   Title  Pt will be able to maintain single leg stance for atleast 5 sec on each LE, 2/3 trials, to improve stability with stair negotiation at home.     Time  6    Period  Weeks    Status  New      PT LONG TERM GOAL #3   Title  Pt will complete TUG in less than 14 sec to reflect improvements in dynamic balance and decrease risk of falling in the community.     Time  6    Period  Weeks    Status  New      PT LONG TERM GOAL #4   Title  Pt will report 50% improvement in her strength, balance and stamina with daily activity in the community.     Time  6    Period  Weeks    Status  New      PT LONG TERM GOAL #5   Title  Pt will score atleast 19 points on the DGI to reflect improvements in her balance and decreased risk of falling in the community.     Time  6    Period  Weeks    Status  New      Additional Long Term Goals   Additional Long Term Goals  Yes  Plan - 09/07/18 1346    Clinical Impression Statement  Completed DGI this session. Pt scored 14 out of 22 points placing her at a higher chance of falls with activity in the community and home. Appropriate goals were set for this. Pt's HEP was implemented and reviewed with her home aide in order to assure full understanding. Pt was able to complete all exercises without significant difficulty, but did require therapist cuing for proper technique. Will continue with current POC to progress LE strength and proprioception.    Personal Factors and Comorbidities  Age;Comorbidity 3+;Fitness     Comorbidities  osteoarthritis of multiple joints, DMII, low back pain     Examination-Activity Limitations  Stairs;Locomotion Level    Examination-Participation Restrictions  Shop;Yard Work    [employer] officer  Evolving/Moderate complexity    Rehab Potential  Good    PT Frequency  2x / week    PT Duration  6 weeks    PT Treatment/Interventions  ADLs/Self Care Home Management;Stair training;Gait training;Functional mobility training;Therapeutic activities;Therapeutic exercise;Balance training;Patient/family education;Neuromuscular re-education;Manual techniques    PT Next Visit Plan  f/u on HEP; progress LE strength (leg press, sport cord); dynamic balance activity    PT Home Exercise Plan  [email or web address]     Consulted and Agree with Plan of Care  Patient;Family member/caregiver    Family Member Consulted  Home Aide       Patient will benefit from skilled therapeutic intervention in order to improve the following deficits and impairments:  Decreased activity tolerance, Abnormal gait, Decreased balance, Decreased endurance, Decreased strength, Postural dysfunction  Visit Diagnosis: Unsteadiness on feet  Muscle weakness (generalized)  Chronic pain of right knee     Problem List Patient Active Problem List   Diagnosis Date Noted  . Post-nasal drip 07/24/2018  . Anxiety 07/04/2018  . Uncontrolled type 2 diabetes mellitus with hyperglycemia ([place]) 06/06/2018  . Chronic fatigue 01/30/2018  . Dyslipidemia 10/17/2017  . Acute eczematoid otitis externa of left ear 07/26/2017  . Fungal dermatitis 07/26/2017  . Cervical spondylosis without myelopathy 05/29/2015  . Cervicogenic headache 05/29/2015  . Obesity 01/14/2015  . OA (osteoarthritis) of knee 09/23/2014  . Precordial chest pain 08/08/2014  . Anemia 07/02/2014  . Depression 06/08/2012  . Diabetes ([place]) 10/27/2009  . KNEE PAIN, RIGHT 07/15/2008  . COLONIC POLYPS, HX OF 02/07/2008  . LOW BACK PAIN 08/04/2007  .  Hypothyroidism 10/14/2006  . Essential hypertension 10/14/2006  . GERD 10/14/2006  . Osteoarthritis 10/14/2006    1:53 PM,09/07/18 Sherol Dade PT, DPT [place] [facility] [place] [facility] [address] [place], [place], [address] Phone: [phone]   Fax:  [phone]  Name: Leslie Benton MRN: [id number] Date of Birth: 15-Oct-1940

## 2018-09-12 ENCOUNTER — Encounter: Payer: Medicare Other | Admitting: Physical Therapy

## 2018-09-14 ENCOUNTER — Ambulatory Visit: Payer: Medicare Other | Admitting: Physical Therapy

## 2018-09-14 ENCOUNTER — Encounter: Payer: Self-pay | Admitting: Physical Therapy

## 2018-09-14 ENCOUNTER — Other Ambulatory Visit: Payer: Self-pay

## 2018-09-14 DIAGNOSIS — M25561 Pain in right knee: Secondary | ICD-10-CM | POA: Diagnosis not present

## 2018-09-14 DIAGNOSIS — M6281 Muscle weakness (generalized): Secondary | ICD-10-CM | POA: Diagnosis not present

## 2018-09-14 DIAGNOSIS — R2681 Unsteadiness on feet: Secondary | ICD-10-CM | POA: Diagnosis not present

## 2018-09-14 DIAGNOSIS — G8929 Other chronic pain: Secondary | ICD-10-CM | POA: Diagnosis not present

## 2018-09-14 NOTE — Therapy (Signed)
[facility] [address] [place], [place], [address] Phone: [phone]   Fax:  [phone]  Physical Therapy Treatment  Patient Details  Name: Leslie Benton MRN: [id number] Date of Birth: 06-Nov-1940 Referring Provider (PT): Abelino Derrick, MD   Encounter Date: 09/14/2018  PT End of Session - 09/14/18 1355    Visit Number  3    Date for PT Re-Evaluation  10/16/18    Authorization Type  Medicare A and B    Authorization Time Period  08/29/18 to 10/16/18    Authorization - Visit Number  3    Authorization - Number of Visits  10    PT Start Time  1309    PT Stop Time  1348    PT Time Calculation (min)  39 min    Activity Tolerance  No increased pain;Patient tolerated treatment well    Behavior During Therapy  [facility] for tasks assessed/performed       Past Medical History:  Diagnosis Date  . Anemia   . Anxiety   . Cervical spondylosis without myelopathy 05/29/2015  . COLONIC POLYPS, HX OF 02/07/2008  . DEGENERATIVE JOINT DISEASE 10/14/2006   03/31/11: Pt states arthritis in her hip.  . Depression   . DIABETES MELLITUS, TYPE II 10/27/2009  . GERD 10/14/2006  . HYPERLIPIDEMIA 10/14/2006  . HYPERTENSION 10/14/2006   off bp meds now  . HYPOTHYROIDISM 10/14/2006  . LOW BACK PAIN 08/04/2007  . Obesity     Past Surgical History:  Procedure Laterality Date  . HEMORRHOID SURGERY    . Left knee arthroscopic  April 2014   Dr. Durward Fortes  . PARTIAL KNEE ARTHROPLASTY Left 09/23/2014   Procedure: LEFT UNICOMPARTMENTAL KNEE;  Surgeon: [name], MD;  Location: [facility];  Service: Orthopedics;  Laterality: Left;  . TONSILLECTOMY    . TUBAL LIGATION      There were no vitals filed for this visit.  Subjective Assessment - 09/14/18 1311    Subjective  Pt states that she was not able to do all of her exercises because she was so busy. She did go swimming.     Pertinent History  Lt TKA (Dr Wynelle Link) 2016    Limitations   Standing;Walking    How long can you stand comfortably?  Has to hold on.  Limited to 3 minutes    How long can you walk comfortably?  up to an hour but very tired    Diagnostic tests  none    Patient Stated Goals  improve balance    Currently in Pain?  No/denies                       [facility] Adult PT Treatment/Exercise - 09/14/18 0001      Knee/Hip Exercises: Machines for Strengthening   Total Gym Leg Press  Seat 6 (incline 3) BLE #85 2x10      Knee/Hip Exercises: Standing   Other Standing Knee Exercises  sports cord sidestepping Lt and Rt #15 x10 reps each       Knee/Hip Exercises: Seated   Knee/Hip Flexion  Rt and Lt hip flexion x15 reps #4 ankle weights     Sit to Sand  1 set;20 reps;without UE support   goblet squat #10          Balance Exercises - 09/14/18 1348      Balance Exercises: Standing   Standing, One Foot on a Step  Eyes open;6 inch;5 reps  3 sec lift off        PT Education - 09/14/18 1352    Education Details  importance of completing HEP     Person(s) Educated  Patient;Caregiver(s)    Methods  Explanation    Comprehension  Verbalized understanding       PT Short Term Goals - 09/07/18 1353      PT SHORT TERM GOAL #1   Title  be independent in initial HEP    Time  4    Period  Weeks    Status  New    Target Date  09/29/18      PT SHORT TERM GOAL #2   Title  Pt will report having gained information regarding silver sneakers to allow for transition to an independent program without lapse in progress.     Time  4    Period  Weeks    Status  New      PT SHORT TERM GOAL #3   Title  Pt will participate in DGI and appropriate goals set moving forward.    Time  4    Period  Weeks    Status  Achieved        PT Long Term Goals - 09/07/18 1351      PT LONG TERM GOAL #1   Title  Pt will demo independence with her advanced HEP to allow for smooth transition to the gym.    Time  6    Period  Weeks    Status  New      PT LONG  TERM GOAL #2   Title  Pt will be able to maintain single leg stance for atleast 5 sec on each LE, 2/3 trials, to improve stability with stair negotiation at home.     Time  6    Period  Weeks    Status  New      PT LONG TERM GOAL #3   Title  Pt will complete TUG in less than 14 sec to reflect improvements in dynamic balance and decrease risk of falling in the community.     Time  6    Period  Weeks    Status  New      PT LONG TERM GOAL #4   Title  Pt will report 50% improvement in her strength, balance and stamina with daily activity in the community.     Time  6    Period  Weeks    Status  New      PT LONG TERM GOAL #5   Title  Pt will score atleast 19 points on the DGI to reflect improvements in her balance and decreased risk of falling in the community.     Time  6    Period  Weeks    Status  New      Additional Long Term Goals   Additional Long Term Goals  Yes            Plan - 09/14/18 1356    Clinical Impression Statement  Pt has been unable to adhere to her HEP over the past week due to a busy schedule. Focused today on LE strength progressions, and she was able to complete sit to stand despite increase in resistance and reps. Pt required encouragement throughout her session, however there was no report of significant LE fatigue or unsteadiness noted end of session. Therapist discussed the importance of establishing a routine for complete her HEP at home and the  pt and her home aide verbalized agreement.     Personal Factors and Comorbidities  Age;Comorbidity 3+;Fitness    Comorbidities  osteoarthritis of multiple joints, DMII, low back pain     Examination-Activity Limitations  Stairs;Locomotion Level    Examination-Participation Restrictions  Shop;Yard Work    [employer] officer  Evolving/Moderate complexity    Rehab Potential  Good    PT Frequency  2x / week    PT Duration  6 weeks    PT Treatment/Interventions  ADLs/Self Care Home  Management;Stair training;Gait training;Functional mobility training;Therapeutic activities;Therapeutic exercise;Balance training;Patient/family education;Neuromuscular re-education;Manual techniques    PT Next Visit Plan  f/u on HEP; progress LE strength (leg press, sport cord); dynamic balance activity    PT Home Exercise Plan  [id number]     Consulted and Agree with Plan of Care  Patient;Family member/caregiver    Family Member Consulted  Home Aide       Patient will benefit from skilled therapeutic intervention in order to improve the following deficits and impairments:  Decreased activity tolerance, Abnormal gait, Decreased balance, Decreased endurance, Decreased strength, Postural dysfunction  Visit Diagnosis: Unsteadiness on feet  Muscle weakness (generalized)  Chronic pain of right knee     Problem List Patient Active Problem List   Diagnosis Date Noted  . Post-nasal drip 07/24/2018  . Anxiety 07/04/2018  . Uncontrolled type 2 diabetes mellitus with hyperglycemia ([place]) 06/06/2018  . Chronic fatigue 01/30/2018  . Dyslipidemia 10/17/2017  . Acute eczematoid otitis externa of left ear 07/26/2017  . Fungal dermatitis 07/26/2017  . Cervical spondylosis without myelopathy 05/29/2015  . Cervicogenic headache 05/29/2015  . Obesity 01/14/2015  . OA (osteoarthritis) of knee 09/23/2014  . Precordial chest pain 08/08/2014  . Anemia 07/02/2014  . Depression 06/08/2012  . Diabetes ([place]) 10/27/2009  . KNEE PAIN, RIGHT 07/15/2008  . COLONIC POLYPS, HX OF 02/07/2008  . LOW BACK PAIN 08/04/2007  . Hypothyroidism 10/14/2006  . Essential hypertension 10/14/2006  . GERD 10/14/2006  . Osteoarthritis 10/14/2006   2:01 PM,09/14/18 Sherol Dade PT, DPT [place] [facility] [address] [place], [place], [address] Phone: [phone]   Fax:  [phone]  Name:  Leslie Benton MRN: [id number] Date of Birth: 1941-04-05

## 2018-09-19 ENCOUNTER — Ambulatory Visit: Payer: Medicare Other | Attending: Family Medicine | Admitting: Physical Therapy

## 2018-09-19 ENCOUNTER — Other Ambulatory Visit: Payer: Self-pay

## 2018-09-19 ENCOUNTER — Encounter: Payer: Self-pay | Admitting: Physical Therapy

## 2018-09-19 DIAGNOSIS — M25561 Pain in right knee: Secondary | ICD-10-CM | POA: Insufficient documentation

## 2018-09-19 DIAGNOSIS — R2681 Unsteadiness on feet: Secondary | ICD-10-CM | POA: Diagnosis not present

## 2018-09-19 DIAGNOSIS — M6281 Muscle weakness (generalized): Secondary | ICD-10-CM | POA: Diagnosis not present

## 2018-09-19 DIAGNOSIS — G8929 Other chronic pain: Secondary | ICD-10-CM | POA: Insufficient documentation

## 2018-09-19 NOTE — Therapy (Signed)
[facility] [address] [place], [place], [address] Phone: [phone]   Fax:  [phone]  Physical Therapy Treatment  Patient Details  Name: Leslie Benton MRN: [id number] Date of Birth: 04/16/1941 Referring Provider (PT): Abelino Derrick, MD   Encounter Date: 09/19/2018  PT End of Session - 09/19/18 1348    Visit Number  4    Date for PT Re-Evaluation  10/16/18    Authorization Type  Medicare A and B    Authorization Time Period  08/29/18 to 10/16/18    Authorization - Visit Number  4    Authorization - Number of Visits  10    PT Start Time  1301    PT Stop Time  2633    PT Time Calculation (min)  44 min    Activity Tolerance  No increased pain;Patient tolerated treatment well    Behavior During Therapy  [facility] for tasks assessed/performed       Past Medical History:  Diagnosis Date  . Anemia   . Anxiety   . Cervical spondylosis without myelopathy 05/29/2015  . COLONIC POLYPS, HX OF 02/07/2008  . DEGENERATIVE JOINT DISEASE 10/14/2006   03/31/11: Pt states arthritis in her hip.  . Depression   . DIABETES MELLITUS, TYPE II 10/27/2009  . GERD 10/14/2006  . HYPERLIPIDEMIA 10/14/2006  . HYPERTENSION 10/14/2006   off bp meds now  . HYPOTHYROIDISM 10/14/2006  . LOW BACK PAIN 08/04/2007  . Obesity     Past Surgical History:  Procedure Laterality Date  . HEMORRHOID SURGERY    . Left knee arthroscopic  April 2014   Dr. Durward Fortes  . PARTIAL KNEE ARTHROPLASTY Left 09/23/2014   Procedure: LEFT UNICOMPARTMENTAL KNEE;  Surgeon: [name], MD;  Location: [facility];  Service: Orthopedics;  Laterality: Left;  . TONSILLECTOMY    . TUBAL LIGATION      There were no vitals filed for this visit.  Subjective Assessment - 09/19/18 1307    Subjective  Pt states that she was sore after her last session so she took ibuprofen before today's session. She only did part of her HEP.     Pertinent History  Lt TKA (Dr Wynelle Link) 2016     Limitations  Standing;Walking    How long can you stand comfortably?  Has to hold on.  Limited to 3 minutes    How long can you walk comfortably?  up to an hour but very tired    Diagnostic tests  none    Patient Stated Goals  improve balance    Currently in Pain?  No/denies                       [facility] Adult PT Treatment/Exercise - 09/19/18 0001      Knee/Hip Exercises: Aerobic   Nustep   Seat 7 L3 x8 min      Knee/Hip Exercises: Standing   Walking with Sports Cord  #15 sidestepping Lt and Rt x10 reps       Knee/Hip Exercises: Seated   Long Arc Quad  2 sets;15 reps    Long Arc Quad Weight  4 lbs.    Knee/Hip Flexion  Rt and Lt hip flexion x15 reps without UE support     Other Seated Knee/Hip Exercises  seated ball toss outside base of support x15 reps while seated on dyna disc     Marching  Both;2 sets;15 reps    Marching Weights  4 lbs.  PT Education - 09/19/18 1347    Education Details  provided HEP for pool exercise    Person(s) Educated  Patient    Methods  Explanation;Handout;Verbal cues    Comprehension  Verbalized understanding       PT Short Term Goals - 09/07/18 1353      PT SHORT TERM GOAL #1   Title  be independent in initial HEP    Time  4    Period  Weeks    Status  New    Target Date  09/29/18      PT SHORT TERM GOAL #2   Title  Pt will report having gained information regarding silver sneakers to allow for transition to an independent program without lapse in progress.     Time  4    Period  Weeks    Status  New      PT SHORT TERM GOAL #3   Title  Pt will participate in DGI and appropriate goals set moving forward.    Time  4    Period  Weeks    Status  Achieved        PT Long Term Goals - 09/07/18 1351      PT LONG TERM GOAL #1   Title  Pt will demo independence with her advanced HEP to allow for smooth transition to the gym.    Time  6    Period  Weeks    Status  New      PT LONG TERM GOAL #2   Title   Pt will be able to maintain single leg stance for atleast 5 sec on each LE, 2/3 trials, to improve stability with stair negotiation at home.     Time  6    Period  Weeks    Status  New      PT LONG TERM GOAL #3   Title  Pt will complete TUG in less than 14 sec to reflect improvements in dynamic balance and decrease risk of falling in the community.     Time  6    Period  Weeks    Status  New      PT LONG TERM GOAL #4   Title  Pt will report 50% improvement in her strength, balance and stamina with daily activity in the community.     Time  6    Period  Weeks    Status  New      PT LONG TERM GOAL #5   Title  Pt will score atleast 19 points on the DGI to reflect improvements in her balance and decreased risk of falling in the community.     Time  6    Period  Weeks    Status  New      Additional Long Term Goals   Additional Long Term Goals  Yes            Plan - 09/19/18 1348    Clinical Impression Statement  Pt has been non-compliant with her HEP despite therapist encouragement in previous sessions. Pt reported LE soreness following last session so minimal progressions were made this visit to promote adequate muscle fatigue. Pt was able to complete seated dynamic balance activity without noted difficulty. End of session therapist discussed the importance of HEP adherence. She enjoys swimming at her home, so a strengthening program was provided, along with parameters, to encourage exercise in her pool. Pt was appreciative of this.     Personal Factors  and Comorbidities  Age;Comorbidity 3+;Fitness    Comorbidities  osteoarthritis of multiple joints, DMII, low back pain     Examination-Activity Limitations  Stairs;Locomotion Level    Examination-Participation Restrictions  Shop;Yard Work    [employer] officer  Evolving/Moderate complexity    Rehab Potential  Good    PT Frequency  2x / week    PT Duration  6 weeks    PT Treatment/Interventions  ADLs/Self Care  Home Management;Stair training;Gait training;Functional mobility training;Therapeutic activities;Therapeutic exercise;Balance training;Patient/family education;Neuromuscular re-education;Manual techniques    PT Next Visit Plan  f/u on pool HEP; progress LE strength (leg press, sport cord); dynamic balance activity    PT Home Exercise Plan  [email or web address]     Consulted and Agree with Plan of Care  Patient;Family member/caregiver    Family Member Consulted  Home Aide       Patient will benefit from skilled therapeutic intervention in order to improve the following deficits and impairments:  Decreased activity tolerance, Abnormal gait, Decreased balance, Decreased endurance, Decreased strength, Postural dysfunction  Visit Diagnosis: Unsteadiness on feet  Muscle weakness (generalized)  Chronic pain of right knee     Problem List Patient Active Problem List   Diagnosis Date Noted  . Post-nasal drip 07/24/2018  . Anxiety 07/04/2018  . Uncontrolled type 2 diabetes mellitus with hyperglycemia ([place]) 06/06/2018  . Chronic fatigue 01/30/2018  . Dyslipidemia 10/17/2017  . Acute eczematoid otitis externa of left ear 07/26/2017  . Fungal dermatitis 07/26/2017  . Cervical spondylosis without myelopathy 05/29/2015  . Cervicogenic headache 05/29/2015  . Obesity 01/14/2015  . OA (osteoarthritis) of knee 09/23/2014  . Precordial chest pain 08/08/2014  . Anemia 07/02/2014  . Depression 06/08/2012  . Diabetes ([place]) 10/27/2009  . KNEE PAIN, RIGHT 07/15/2008  . COLONIC POLYPS, HX OF 02/07/2008  . LOW BACK PAIN 08/04/2007  . Hypothyroidism 10/14/2006  . Essential hypertension 10/14/2006  . GERD 10/14/2006  . Osteoarthritis 10/14/2006    1:56 PM,09/19/18 Sherol Dade PT, DPT [place] [facility] [address], [place], [place], [address] Phone: [phone]   Fax:   [phone]  Name: Leslie Benton MRN: [id number] Date of Birth: [birth year]

## 2018-09-19 NOTE — Patient Instructions (Signed)
Access Code: [email or web address]  URL: [email or web address]  Date: 09/19/2018  Prepared by: Sherol Dade   Exercises  Tree Pose - 10 reps - 3 sets - 1x daily - 7x weekly  Scissors - 10 reps - 3 sets - 1x daily - 7x weekly  Single Leg Kicks Forward and Backward with Hand Floats - 10 reps - 3 sets - 1x daily - 7x weekly    [facility] [address], [place], [place] [address] Phone # [phone] Fax [phone]

## 2018-09-21 ENCOUNTER — Encounter: Payer: Medicare Other | Admitting: Physical Therapy

## 2018-09-26 ENCOUNTER — Ambulatory Visit: Payer: Medicare Other | Admitting: Physical Therapy

## 2018-09-26 ENCOUNTER — Other Ambulatory Visit: Payer: Self-pay

## 2018-09-26 ENCOUNTER — Encounter: Payer: Self-pay | Admitting: Physical Therapy

## 2018-09-26 DIAGNOSIS — M6281 Muscle weakness (generalized): Secondary | ICD-10-CM | POA: Diagnosis not present

## 2018-09-26 DIAGNOSIS — G8929 Other chronic pain: Secondary | ICD-10-CM

## 2018-09-26 DIAGNOSIS — R2681 Unsteadiness on feet: Secondary | ICD-10-CM

## 2018-09-26 DIAGNOSIS — M25561 Pain in right knee: Secondary | ICD-10-CM | POA: Diagnosis not present

## 2018-09-26 NOTE — Therapy (Signed)
[facility] [address] [place], [place], [address] Phone: [phone]   Fax:  [phone]  Physical Therapy Treatment  Patient Details  Name: Leslie Benton MRN: [id number] Date of Birth: 05-Jun-1940 Referring Provider (PT): Abelino Derrick, MD   Encounter Date: 09/26/2018  PT End of Session - 09/26/18 1313    Visit Number  5    Date for PT Re-Evaluation  10/16/18    Authorization Type  Medicare A and B    Authorization Time Period  08/29/18 to 10/16/18    Authorization - Visit Number  5    Authorization - Number of Visits  10    PT Start Time  1300    PT Stop Time  1340    PT Time Calculation (min)  40 min    Activity Tolerance  No increased pain;Patient tolerated treatment well    Behavior During Therapy  [facility] for tasks assessed/performed       Past Medical History:  Diagnosis Date  . Anemia   . Anxiety   . Cervical spondylosis without myelopathy 05/29/2015  . COLONIC POLYPS, HX OF 02/07/2008  . DEGENERATIVE JOINT DISEASE 10/14/2006   03/31/11: Pt states arthritis in her hip.  . Depression   . DIABETES MELLITUS, TYPE II 10/27/2009  . GERD 10/14/2006  . HYPERLIPIDEMIA 10/14/2006  . HYPERTENSION 10/14/2006   off bp meds now  . HYPOTHYROIDISM 10/14/2006  . LOW BACK PAIN 08/04/2007  . Obesity     Past Surgical History:  Procedure Laterality Date  . HEMORRHOID SURGERY    . Left knee arthroscopic  April 2014   Dr. Durward Fortes  . PARTIAL KNEE ARTHROPLASTY Left 09/23/2014   Procedure: LEFT UNICOMPARTMENTAL KNEE;  Surgeon: [name], MD;  Location: [facility];  Service: Orthopedics;  Laterality: Left;  . TONSILLECTOMY    . TUBAL LIGATION      There were no vitals filed for this visit.  Subjective Assessment - 09/26/18 1300    Subjective  Pt states that she is doing well. She was having stomach issues so she wasn't able to do her exercises.     Pertinent History  Lt TKA (Dr Wynelle Link) 2016    Limitations   Standing;Walking    How long can you stand comfortably?  Has to hold on.  Limited to 3 minutes    How long can you walk comfortably?  up to an hour but very tired    Diagnostic tests  none    Patient Stated Goals  improve balance    Currently in Pain?  No/denies                       [facility] Adult PT Treatment/Exercise - 09/26/18 0001      Knee/Hip Exercises: Aerobic   Nustep  Seat 7 L2 x3 min, L3 x30mn Pt ecouraged to maintain SPM above 80      Knee/Hip Exercises: Standing   Knee Flexion  Strengthening;Right;Left;2 sets;15 reps    Knee Flexion Limitations  2# ankle weights    Hip Extension  Both;Stengthening;2 sets;15 reps    Extension Limitations  2#, propped on elbows     SLS  1 finger support x15 sec each; 4 sec max without UE support     Other Standing Knee Exercises  rows with red TB x15 reps; green TB x15 reps       Knee/Hip Exercises: Seated   Marching  AROM;1 set;10 reps;Both    Marching  Limitations  without UE support LE tap 6" box              PT Education - 09/26/18 1308    Education Details  Technique with therex    Person(s) Educated  Patient    Methods  Explanation;Verbal cues    Comprehension  Verbalized understanding       PT Short Term Goals - 09/26/18 1306      PT SHORT TERM GOAL #1   Title  be independent in initial HEP    Time  4    Period  Weeks    Status  Partially Met    Target Date  09/29/18      PT SHORT TERM GOAL #2   Title  Pt will report having gained information regarding silver sneakers to allow for transition to an independent program without lapse in progress.     Time  4    Period  Weeks    Status  New      PT SHORT TERM GOAL #3   Title  Pt will participate in DGI and appropriate goals set moving forward.    Time  4    Period  Weeks    Status  Achieved        PT Long Term Goals - 09/26/18 0001      PT LONG TERM GOAL #1   Title  Pt will demo independence with her advanced HEP to allow for smooth  transition to the gym.    Time  6    Period  Weeks    Status  On-going      PT LONG TERM GOAL #2   Title  Pt will be able to maintain single leg stance for atleast 5 sec on each LE, 2/3 trials, to improve stability with stair negotiation at home.     Time  6    Period  Weeks    Status  Partially Met      PT LONG TERM GOAL #3   Title  Pt will complete TUG in less than 14 sec to reflect improvements in dynamic balance and decrease risk of falling in the community.     Time  6    Period  Weeks    Status  On-going      PT LONG TERM GOAL #4   Title  Pt will report 50% improvement in her strength, balance and stamina with daily activity in the community.     Time  6    Period  Weeks    Status  New      PT LONG TERM GOAL #5   Title  Pt will score atleast 19 points on the DGI to reflect improvements in her balance and decreased risk of falling in the community.     Time  6    Period  Weeks    Status  New            Plan - 09/26/18 1334    Clinical Impression Statement  Pt arrived noting she was unable to complete her HEP due to flare up of stomach issues. She was able to complete all exercises, although she required several rest breaks due to reported fatigue from the weekend's activities. Pt was able to maintain single leg balance for 4 sec, improved from her evaluation. Ended with Nustep to promote LE/UE endurance. Pt required education to avoid over exertion and maintain a steady pace, but did well with this. Pt would continue to  benefit from encouragement adhering to her HEP.     Personal Factors and Comorbidities  Age;Comorbidity 3+;Fitness    Comorbidities  osteoarthritis of multiple joints, DMII, low back pain     Examination-Activity Limitations  Stairs;Locomotion Level    Examination-Participation Restrictions  Shop;Yard Work    [employer]chant [employer] officer  Evolving/Moderate complexity    Rehab Potential  Good    PT Frequency  2x / week    PT Duration  6 weeks     PT Treatment/Interventions  ADLs/Self Care Home Management;Stair training;Gait training;Functional mobility training;Therapeutic activities;Therapeutic exercise;Balance training;Patient/family education;Neuromuscular re-education;Manual techniques    PT Next Visit Plan  f/u on pool and home HEP; progress LE strength (leg press?, sport cord); dynamic balance activity    PT Home Exercise Plan  [email or web address]     Consulted and Agree with Plan of Care  Patient;Family member/caregiver    Family Member Consulted  Home Aide       Patient will benefit from skilled therapeutic intervention in order to improve the following deficits and impairments:  Decreased activity tolerance, Abnormal gait, Decreased balance, Decreased endurance, Decreased strength, Postural dysfunction  Visit Diagnosis: Unsteadiness on feet  Muscle weakness (generalized)  Chronic pain of right knee     Problem List Patient Active Problem List   Diagnosis Date Noted  . Post-nasal drip 07/24/2018  . Anxiety 07/04/2018  . Uncontrolled type 2 diabetes mellitus with hyperglycemia ([place]) 06/06/2018  . Chronic fatigue 01/30/2018  . Dyslipidemia 10/17/2017  . Acute eczematoid otitis externa of left ear 07/26/2017  . Fungal dermatitis 07/26/2017  . Cervical spondylosis without myelopathy 05/29/2015  . Cervicogenic headache 05/29/2015  . Obesity 01/14/2015  . OA (osteoarthritis) of knee 09/23/2014  . Precordial chest pain 08/08/2014  . Anemia 07/02/2014  . Depression 06/08/2012  . Diabetes ([place]) 10/27/2009  . KNEE PAIN, RIGHT 07/15/2008  . COLONIC POLYPS, HX OF 02/07/2008  . LOW BACK PAIN 08/04/2007  . Hypothyroidism 10/14/2006  . Essential hypertension 10/14/2006  . GERD 10/14/2006  . Osteoarthritis 10/14/2006    1:45 PM,09/26/18 Sherol Dade PT, DPT [place] [facility] [place]   [facility] [address], [place], [place], [address] Phone: [phone]   Fax:  [phone]  Name: Leslie Benton MRN: [id number] Date of Birth: [birth year]

## 2018-09-28 ENCOUNTER — Encounter: Payer: Self-pay | Admitting: Physical Therapy

## 2018-09-28 ENCOUNTER — Ambulatory Visit: Payer: Medicare Other | Admitting: Physical Therapy

## 2018-09-28 ENCOUNTER — Other Ambulatory Visit: Payer: Self-pay

## 2018-09-28 DIAGNOSIS — G8929 Other chronic pain: Secondary | ICD-10-CM | POA: Diagnosis not present

## 2018-09-28 DIAGNOSIS — M6281 Muscle weakness (generalized): Secondary | ICD-10-CM

## 2018-09-28 DIAGNOSIS — R2681 Unsteadiness on feet: Secondary | ICD-10-CM

## 2018-09-28 DIAGNOSIS — M25561 Pain in right knee: Secondary | ICD-10-CM

## 2018-09-28 NOTE — Therapy (Signed)
[facility] [address] [place], [place], [address] Phone: [phone]   Fax:  [phone]  Physical Therapy Treatment  Patient Details  Name: Leslie Benton MRN: [id number] Date of Birth: June 13, 1940 Referring Provider (PT): Abelino Derrick, MD   Encounter Date: 09/28/2018  PT End of Session - 09/28/18 1305    Visit Number  6    Date for PT Re-Evaluation  10/16/18    Authorization Type  Medicare A and B    Authorization Time Period  08/29/18 to 10/16/18    Authorization - Visit Number  6    Authorization - Number of Visits  10    PT Start Time  1300    PT Stop Time  1340    PT Time Calculation (min)  40 min    Activity Tolerance  No increased pain;Patient tolerated treatment well    Behavior During Therapy  [facility] for tasks assessed/performed       Past Medical History:  Diagnosis Date  . Anemia   . Anxiety   . Cervical spondylosis without myelopathy 05/29/2015  . COLONIC POLYPS, HX OF 02/07/2008  . DEGENERATIVE JOINT DISEASE 10/14/2006   03/31/11: Pt states arthritis in her hip.  . Depression   . DIABETES MELLITUS, TYPE II 10/27/2009  . GERD 10/14/2006  . HYPERLIPIDEMIA 10/14/2006  . HYPERTENSION 10/14/2006   off bp meds now  . HYPOTHYROIDISM 10/14/2006  . LOW BACK PAIN 08/04/2007  . Obesity     Past Surgical History:  Procedure Laterality Date  . HEMORRHOID SURGERY    . Left knee arthroscopic  April 2014   Dr. Durward Fortes  . PARTIAL KNEE ARTHROPLASTY Left 09/23/2014   Procedure: LEFT UNICOMPARTMENTAL KNEE;  Surgeon: [name], MD;  Location: [facility];  Service: Orthopedics;  Laterality: Left;  . TONSILLECTOMY    . TUBAL LIGATION      There were no vitals filed for this visit.  Subjective Assessment - 09/28/18 1304    Subjective  Pt reports " I have been having stomach problems big time."  I haven't been doing the exercise. I was moving furniture last week.    Pertinent History  Lt TKA (Dr Wynelle Link) 2016    Limitations  Standing;Walking    Patient Stated Goals  improve balance    Currently in Pain?  No/denies                       [facility] Adult PT Treatment/Exercise - 09/28/18 0001      Neuro Re-ed    Neuro Re-ed Details   balance and posture      Knee/Hip Exercises: Stretches   Active Hamstring Stretch  Right;Left;10 seconds;2 reps   when standing     Knee/Hip Exercises: Aerobic   Nustep  Seat 7 L2 x 8 min   PT present for status update     Knee/Hip Exercises: Standing   Knee Flexion  Strengthening;Right;Left;2 sets;15 reps    Knee Flexion Limitations  tapping toe on step next the to treadmill    Other Standing Knee Exercises  side steps no UE support - 20x each way     Other Standing Knee Exercises  weight shift fwd/back, side to side advancing to single leg standing without UE support - able to hold 6 seconds max on each side      Knee/Hip Exercises: Seated   Sit to Sand  20 reps   holding blue weighted ball - cues to get  lower     Shoulder Exercises: Standing   Extension  Strengthening;Both;20 reps;Theraband    Theraband Level (Shoulder Extension)  Level 3 (Green)   cues to lift chest for improved upright posture              PT Short Term Goals - 09/28/18 0001      PT SHORT TERM GOAL #1   Title  be independent in initial HEP    Baseline  still having trouble because stomach problems    Status  Partially Met      PT SHORT TERM GOAL #2   Title  Pt will report having gained information regarding silver sneakers to allow for transition to an independent program without lapse in progress.     Baseline  states her insurance doesn't cover it    Status  On-going        PT Long Term Goals - 09/26/18 0001      PT LONG TERM GOAL #1   Title  Pt will demo independence with her advanced HEP to allow for smooth transition to the gym.    Time  6    Period  Weeks    Status  On-going      PT LONG TERM GOAL #2   Title  Pt will be able to maintain single  leg stance for atleast 5 sec on each LE, 2/3 trials, to improve stability with stair negotiation at home.     Time  6    Period  Weeks    Status  Partially Met      PT LONG TERM GOAL #3   Title  Pt will complete TUG in less than 14 sec to reflect improvements in dynamic balance and decrease risk of falling in the community.     Time  6    Period  Weeks    Status  On-going      PT LONG TERM GOAL #4   Title  Pt will report 50% improvement in her strength, balance and stamina with daily activity in the community.     Time  6    Period  Weeks    Status  New      PT LONG TERM GOAL #5   Title  Pt will score atleast 19 points on the DGI to reflect improvements in her balance and decreased risk of falling in the community.     Time  6    Period  Weeks    Status  New            Plan - 09/28/18 1358    Clinical Impression Statement  Pt arrived and reports she still has not done HEP.  Pt demonstrates improved single leg stand for up to 6 seconds on both sides.  She demonstrates improved upright posture doing shoulder extension and cues to lift her chest.  She needed a couple of sitting breaks but tolerated standing exercises very well only needing to 2 sitting breaks.  Pt will continue to benefit from skilled PT to progress strength and improve standing balance so she can do more functional activities.    PT Treatment/Interventions  ADLs/Self Care Home Management;Stair training;Gait training;Functional mobility training;Therapeutic activities;Therapeutic exercise;Balance training;Patient/family education;Neuromuscular re-education;Manual techniques    PT Next Visit Plan  f/u on pool and home HEP; progress LE strength (leg press?, sport cord); dynamic balance activity    PT Home Exercise Plan  [id number]     Consulted and Agree with Plan of Care  Patient;Family member/caregiver    Family Member Consulted  Home Aide       Patient will benefit from skilled therapeutic intervention in order to  improve the following deficits and impairments:  Decreased activity tolerance, Abnormal gait, Decreased balance, Decreased endurance, Decreased strength, Postural dysfunction  Visit Diagnosis: Unsteadiness on feet   Muscle weakness (generalized)    Chronic pain of right knee    Problem List Patient Active Problem List   Diagnosis Date Noted  . Post-nasal drip 07/24/2018  . Anxiety 07/04/2018  . Uncontrolled type 2 diabetes mellitus with hyperglycemia ([place]) 06/06/2018  . Chronic fatigue 01/30/2018  . Dyslipidemia 10/17/2017  . Acute eczematoid otitis externa of left ear 07/26/2017  . Fungal dermatitis 07/26/2017  . Cervical spondylosis without myelopathy 05/29/2015  . Cervicogenic headache 05/29/2015  . Obesity 01/14/2015  . OA (osteoarthritis) of knee 09/23/2014  . Precordial chest pain 08/08/2014  . Anemia 07/02/2014  . Depression 06/08/2012  . Diabetes ([place]) 10/27/2009  . KNEE PAIN, RIGHT 07/15/2008  . COLONIC POLYPS, HX OF 02/07/2008  . LOW BACK PAIN 08/04/2007  . Hypothyroidism 10/14/2006  . Essential hypertension 10/14/2006  . GERD 10/14/2006  . Osteoarthritis 10/14/2006    Jule Ser, PT 09/28/2018, 2:02 PM   [facility] [address], [place], [place], [address] Phone: [phone]   Fax:  [phone]  Name: CAMYA HAYDON MRN: [id number] Date of Birth: 06-28-1940

## 2018-09-29 ENCOUNTER — Other Ambulatory Visit: Payer: Self-pay | Admitting: Family Medicine

## 2018-09-29 ENCOUNTER — Telehealth: Payer: Self-pay | Admitting: Family Medicine

## 2018-09-29 DIAGNOSIS — E039 Hypothyroidism, unspecified: Secondary | ICD-10-CM

## 2018-09-29 DIAGNOSIS — R5382 Chronic fatigue, unspecified: Secondary | ICD-10-CM

## 2018-09-29 MED ORDER — PHENTERMINE HCL 37.5 MG PO CAPS: 37.5000 mg | ORAL_CAPSULE | ORAL | 0 refills | Status: DC | PRN

## 2018-09-29 NOTE — Telephone Encounter (Signed)
Pt called stating that she is following directions of Dr. Loanne Drilling. She would like help with her nutrition but doesn't want to go all the way to [address] as it is too far. She has to get someone to drive her. Pt states she has 2 weeks left of PT as was advised. She is trying to reduce medications (pt discussed metformin and insulin). Pt states Dr. Loanne Drilling told her to follow up with him if she needs to and she doesn't feel she needs to. Pt said metformin destroys her life and she cannot function on it. It makes her tired.   Pt does want to continue phentermine and does not understand need for appt with Dr. Ethelene Hal or Loanne Drilling. Pt has 6 doses left and takes 1 a day. She said it takes the edge off the bad effects of her other medications.   Pt thoughts were hard to understand.

## 2018-09-29 NOTE — Telephone Encounter (Signed)
Patient states she is waiting for a returned call from [place]. Patient is requesting this so they can discuss the medication refill.

## 2018-09-29 NOTE — Addendum Note (Signed)
Addended by: Jon Billings on: 09/29/2018 04:17 PM   Modules accepted: Orders

## 2018-10-02 ENCOUNTER — Ambulatory Visit (INDEPENDENT_AMBULATORY_CARE_PROVIDER_SITE_OTHER): Payer: Medicare Other | Admitting: Family Medicine

## 2018-10-02 ENCOUNTER — Encounter: Payer: Self-pay | Admitting: Family Medicine

## 2018-10-02 ENCOUNTER — Telehealth: Payer: Self-pay | Admitting: Family Medicine

## 2018-10-02 DIAGNOSIS — E1165 Type 2 diabetes mellitus with hyperglycemia: Secondary | ICD-10-CM | POA: Diagnosis not present

## 2018-10-02 DIAGNOSIS — R5382 Chronic fatigue, unspecified: Secondary | ICD-10-CM | POA: Diagnosis not present

## 2018-10-02 DIAGNOSIS — E039 Hypothyroidism, unspecified: Secondary | ICD-10-CM

## 2018-10-02 NOTE — Telephone Encounter (Signed)
Copied from [place] [id number]. Topic: Quick Communication - See Telephone Encounter >> Oct 02, 2018 12:57 PM Blase Mess A wrote: CRM for notification. See Telephone encounter for: 10/02/18.  Patient is calling to have labs scheduled at [facility]. [facility] is requesting if the labs can be changed to [place]. [facility] is requesting to be routed to have labs scheduled. Thank you

## 2018-10-02 NOTE — Telephone Encounter (Signed)
Okay for telephone visit

## 2018-10-02 NOTE — Progress Notes (Addendum)
Established Patient Office Visit  Subjective:  Patient ID: Leslie Benton, female    DOB: 1940-06-20  Age: 78 y.o. MRN: [id number]  CC:  Chief Complaint  Patient presents with  . Follow-up    HPI Leslie Benton presents for follow-up of her uncontrolled diabetes hypothyroidism and fatigue.  Patient refuses to take insulin.  However she has adjusted her diet and assures compliance with metformin twice daily and her glimepiride.  She assures compliance with her thyroid medicine in the morning on a fasting stomach.  She is taking her phentermine on a daily basis  Past Medical History:  Diagnosis Date  . Anemia   . Anxiety   . Cervical spondylosis without myelopathy 05/29/2015  . COLONIC POLYPS, HX OF 02/07/2008  . DEGENERATIVE JOINT DISEASE 10/14/2006   03/31/11: Pt states arthritis in her hip.  . Depression   . DIABETES MELLITUS, TYPE II 10/27/2009  . GERD 10/14/2006  . HYPERLIPIDEMIA 10/14/2006  . HYPERTENSION 10/14/2006   off bp meds now  . HYPOTHYROIDISM 10/14/2006  . LOW BACK PAIN 08/04/2007  . Obesity     Past Surgical History:  Procedure Laterality Date  . HEMORRHOID SURGERY    . Left knee arthroscopic  April 2014   Dr. Durward Fortes  . PARTIAL KNEE ARTHROPLASTY Left 09/23/2014   Procedure: LEFT UNICOMPARTMENTAL KNEE;  Surgeon: [name], MD;  Location: [facility];  Service: Orthopedics;  Laterality: Left;  . TONSILLECTOMY    . TUBAL LIGATION      Family History  Problem Relation Age of Onset  . Heart attack Father 68  . Diabetes Father   . Kidney failure Mother   . Cancer Sister        endometrial  . Cancer Brother        Adrenal    Social History   Socioeconomic History  . Marital status: Married    Spouse name: Not on file  . Number of children: 2  . Years of education: college  . Highest education level: Not on file  Occupational History  . Occupation: retired  Scientific laboratory technician  . Financial resource strain: Not on file  . Food insecurity    Worry: Not on file     Inability: Not on file  . Transportation needs    Medical: Not on file    Non-medical: Not on file  Tobacco Use  . Smoking status: Never Smoker  . Smokeless tobacco: Never Used  . Tobacco comment: Never Used Tobacco  Substance and Sexual Activity  . Alcohol use: No    Alcohol/week: 0.0 standard drinks  . Drug use: No  . Sexual activity: Not on file  Lifestyle  . Physical activity    Days per week: Not on file    Minutes per session: Not on file  . Stress: Not on file  Relationships  . Social Herbalist on phone: Not on file    Gets together: Not on file    Attends religious service: Not on file    Active member of club or organization: Not on file    Attends meetings of clubs or organizations: Not on file    Relationship status: Not on file  . Intimate partner violence    Fear of current or ex partner: Not on file    Emotionally abused: Not on file    Physically abused: Not on file    Forced sexual activity: Not on file  Other Topics Concern  . Not on  file  Social History Narrative   Lives with husband and son.     Patient drinks 1-2 cups of caffeine daily.   Patient is left handed.     Outpatient Medications Prior to Visit  Medication Sig Dispense Refill  . busPIRone (BUSPAR) 15 MG tablet Take 1 tablet (15 mg total) by mouth daily. 180 tablet 3  . fluticasone (FLONASE) 50 MCG/ACT nasal spray Place 2 sprays into both nostrils daily. 16 g 2  . glimepiride (AMARYL) 1 MG tablet Take 1 tablet (1 mg total) by mouth daily with breakfast. 90 tablet 3  . ibuprofen (ADVIL,MOTRIN) 200 MG tablet Take 400 mg by mouth as needed.    . IRON PO Take 1 tablet by mouth daily.    Marland Kitchen loratadine (CLARITIN) 10 MG tablet Take 10 mg by mouth daily.    . mesalamine (CANASA) 1000 MG suppository as needed.   3  . metFORMIN (GLUCOPHAGE) 1000 MG tablet Take 1 tablet (1,000 mg total) by mouth 2 (two) times daily with a meal. 180 tablet 1  . Multiple Vitamins-Minerals (MULTI-VITAMIN  GUMMIES PO) Take 2 each by mouth daily.    . phentermine 37.5 MG capsule Take 1 capsule (37.5 mg total) by mouth as needed. 30 capsule 0  . propranolol (INDERAL) 20 MG tablet Take 1 tablet (20 mg total) by mouth 2 (two) times daily. 180 tablet 3  . levothyroxine (SYNTHROID) 150 MCG tablet TAKE 1 TABLET BY MOUTH EVERY MORNING ON AN EMPTY STOMACH 90 tablet 1   No facility-administered medications prior to visit.     Allergies  Allergen Reactions  . Niacin And Related Hives and Swelling    No anaphalaxis, but strong reactions.  . Lactose Intolerance (Gi)     "bad taste in mouth"  . Amoxicillin Swelling    hives  . Lactase   . Penicillins Swelling    Hives With all cillins    ROS Review of Systems  Constitutional: Positive for fatigue. Negative for diaphoresis, fever and unexpected weight change.  Eyes: Negative for photophobia and visual disturbance.  Respiratory: Negative.   Cardiovascular: Negative.   Gastrointestinal: Negative.   Endocrine: Negative for polyphagia and polyuria.  Hematological: Negative.   Psychiatric/Behavioral: Negative.       Objective:    Physical Exam  Constitutional: No distress.  Pulmonary/Chest: Effort normal.  Neurological: She is alert.  Psychiatric: She has a normal mood and affect. Her behavior is normal.    There were no vitals taken for this visit. Wt Readings from Last 3 Encounters:  07/04/18 171 lb (77.6 kg)  06/20/18 170 lb 3.2 oz (77.2 kg)  06/06/18 172 lb 4 oz (78.1 kg)   BP Readings from Last 3 Encounters:  07/04/18 130/80  06/20/18 120/78  06/06/18 126/80   Guideline developer:  UpToDate (see UpToDate for funding source) Date Released: June 2014  Health Maintenance Due  Topic Date Due  . DEXA SCAN  04/17/2004  . OPHTHALMOLOGY EXAM  05/03/2015  . URINE MICROALBUMIN  08/04/2016    There are no preventive care reminders to display for this patient.  Lab Results  Component Value Date   TSH 5.06 (H) 10/11/2018    Lab Results  Component Value Date   WBC 6.6 10/11/2018   HGB 13.5 10/11/2018   HCT 41.2 10/11/2018   MCV 90.6 10/11/2018   PLT 377.0 10/11/2018   Lab Results  Component Value Date   NA 137 10/11/2018   K 4.4 10/11/2018   CO2 24 10/11/2018  GLUCOSE 343 (H) 10/11/2018   BUN 19 10/11/2018   CREATININE 0.78 10/11/2018   BILITOT 0.3 07/30/2016   ALKPHOS 87 07/30/2016   AST 13 07/30/2016   ALT 15 07/30/2016   PROT 7.2 07/30/2016   ALBUMIN 4.4 07/30/2016   CALCIUM 9.5 10/11/2018   ANIONGAP 10 09/25/2014   GFR 71.16 10/11/2018   Lab Results  Component Value Date   CHOL 282 (H) 08/05/2015   Lab Results  Component Value Date   HDL 54.10 08/05/2015   Lab Results  Component Value Date   LDLCALC 111 (H) 07/03/2014   Lab Results  Component Value Date   TRIG 307.0 (H) 08/05/2015   Lab Results  Component Value Date   CHOLHDL 5 08/05/2015   Lab Results  Component Value Date   HGBA1C 11.6 (H) 10/11/2018      Assessment & Plan:   Problem List Items Addressed This Visit      Endocrine   Hypothyroidism   Relevant Medications   levothyroxine (SYNTHROID) 175 MCG tablet   Other Relevant Orders   TSH (Completed)   Uncontrolled type 2 diabetes mellitus with hyperglycemia (HCC) - Primary   Relevant Medications   Semaglutide,0.25 or 0.5MG /DOS, (OZEMPIC, 0.25 OR 0.5 MG/DOSE,) 2 MG/1.5ML SOPN   Other Relevant Orders   CBC (Completed)   Basic metabolic panel (Completed)   Hemoglobin A1c (Completed)     Other   Chronic fatigue      Meds ordered this encounter  Medications  . Semaglutide,0.25 or 0.5MG /DOS, (OZEMPIC, 0.25 OR 0.5 MG/DOSE,) 2 MG/1.5ML SOPN    Sig: Inject 0.5 mg into the skin once a week for 30 days.    Dispense:  2 pen    Refill:  4    Please instruct  . levothyroxine (SYNTHROID) 175 MCG tablet    Sig: Take 1 tablet (175 mcg total) by mouth daily before breakfast.    Dispense:  30 tablet    Refill:  2    Follow-up: Return 3-4 weeks for a face to  face visit..   Virtual Visit via Video Note  I connected with Leslie Benton on 10/16/18 at 10:00 AM EDT by a video enabled telemedicine application and verified that I am speaking with the correct person using two identifiers.  Location: Patient: home Provider:   I discussed the limitations of evaluation and management by telemedicine and the availability of in person appointments. The patient expressed understanding and agreed to proceed.  History of Present Illness:    Observations/Objective:   Assessment and Plan:   Follow Up Instructions:    I discussed the assessment and treatment plan with the patient. The patient was provided an opportunity to ask questions and all were answered. The patient agreed with the plan and demonstrated an understanding of the instructions.   The patient was advised to call back or seek an in-person evaluation if the symptoms worsen or if the condition fails to improve as anticipated.  I provided 20 minutes of non-face-to-face time during this encounter.  Interactive video and audio telecommunications were attempted between myself and the patient. However they failed due to the patient having technical difficulties or not having access to video capability. We continued and completed with audio only. [name], MD  Explained to [name] that most of her fatigue is probably coming from the fact that her blood sugars have been running so high in her diabetes is out of control.  TSH is been consistently elevated.  Patient assures  compliance.  We will go ahead and increase her Synthroid dosage to 175 mcg.  We will check her tolerance at her next OV in 3 to 4 weeks.  She will follow-up in the next 3 to 4 weeks for a face-to-face visit.

## 2018-10-02 NOTE — Telephone Encounter (Signed)
Its scheduled for today at 10:00am

## 2018-10-03 ENCOUNTER — Encounter: Payer: Medicare Other | Admitting: Physical Therapy

## 2018-10-04 ENCOUNTER — Telehealth: Payer: Self-pay | Admitting: Family Medicine

## 2018-10-04 NOTE — Telephone Encounter (Signed)
Pt called stating she would like a reduced dose of metformin. She thinks the dose she is on is restricting her energy level. She is requesting to reduce to metformin 500mg  twice a day. Please advise.  [facility] [place], [place] [address] AT [place] [phone] (Phone) [phone] (Fax)

## 2018-10-04 NOTE — Telephone Encounter (Signed)
Pt called back in and stated it was ok to leave a vm if she was not home and did not answer

## 2018-10-04 NOTE — Telephone Encounter (Signed)
We could decrease the metformin possibly if she would agree to take alternative medicines that may include insulin.

## 2018-10-05 ENCOUNTER — Ambulatory Visit: Payer: Medicare Other | Admitting: Physical Therapy

## 2018-10-05 ENCOUNTER — Encounter: Payer: Self-pay | Admitting: Physical Therapy

## 2018-10-05 ENCOUNTER — Other Ambulatory Visit: Payer: Self-pay

## 2018-10-05 DIAGNOSIS — G8929 Other chronic pain: Secondary | ICD-10-CM | POA: Diagnosis not present

## 2018-10-05 DIAGNOSIS — R2681 Unsteadiness on feet: Secondary | ICD-10-CM

## 2018-10-05 DIAGNOSIS — M6281 Muscle weakness (generalized): Secondary | ICD-10-CM

## 2018-10-05 DIAGNOSIS — M25561 Pain in right knee: Secondary | ICD-10-CM | POA: Diagnosis not present

## 2018-10-05 NOTE — Telephone Encounter (Signed)
I spoke with pt. She is okay with trying other alternatives. She does want to know which medicines you would be considering so that she can read up on them. She has taken the extended release versions of Glimperide and Glipizide and she had the same effects that she is having now with the metformin.

## 2018-10-05 NOTE — Therapy (Addendum)
[facility] [address], [place], [place], [address] Phone: [phone]   Fax:  [phone]  Physical Therapy Treatment  Patient Details  Name: Leslie Benton MRN: [id number] Date of Birth: 1940-07-22 Referring Provider (PT): Abelino Derrick, MD   Encounter Date: 10/05/2018  PT End of Session - 10/05/18 1307    Visit Number  7    Date for PT Re-Evaluation  10/16/18    Authorization Type  Medicare A and B    Authorization Time Period  08/29/18 to 10/16/18    Authorization - Visit Number  7    Authorization - Number of Visits  10    PT Start Time  6761    PT Stop Time  9509    PT Time Calculation (min)  46 min    Activity Tolerance  No increased pain;Patient tolerated treatment well    Behavior During Therapy  [facility] for tasks assessed/performed       Past Medical History:  Diagnosis Date  . Anemia   . Anxiety   . Cervical spondylosis without myelopathy 05/29/2015  . COLONIC POLYPS, HX OF 02/07/2008  . DEGENERATIVE JOINT DISEASE 10/14/2006   03/31/11: Pt states arthritis in her hip.  . Depression   . DIABETES MELLITUS, TYPE II 10/27/2009  . GERD 10/14/2006  . HYPERLIPIDEMIA 10/14/2006  . HYPERTENSION 10/14/2006   off bp meds now  . HYPOTHYROIDISM 10/14/2006  . LOW BACK PAIN 08/04/2007  . Obesity     Past Surgical History:  Procedure Laterality Date  . HEMORRHOID SURGERY    . Left knee arthroscopic  April 2014   Dr. Durward Fortes  . PARTIAL KNEE ARTHROPLASTY Left 09/23/2014   Procedure: LEFT UNICOMPARTMENTAL KNEE;  Surgeon: [name], MD;  Location: [facility];  Service: Orthopedics;  Laterality: Left;  . TONSILLECTOMY    . TUBAL LIGATION      There were no vitals filed for this visit.  Subjective Assessment - 10/05/18 1308    Subjective  Pt states the weather seems to affect my balance and my arthritis.  I don't know how the balance is going.  I am back in the pool . I like stretching it makes me feel so good    Patient Stated Goals  improve balance    Currently in Pain?  No/denies         [facility] PT Assessment - 10/05/18 0001      Timed Up and Go Test   TUG Comments  3 trials - 12, 12, 11 sec                   [facility] Adult PT Treatment/Exercise - 10/05/18 0001      Neuro Re-ed    Neuro Re-ed Details   standing exercises without UE support - standing on foam mat with weight shifting and marching - no UE needed just close supervision      Knee/Hip Exercises: Stretches   Active Hamstring Stretch  Right;Left;10 seconds;2 reps   when standing   Gastroc Stretch  Right;Left;1 rep;20 seconds      Knee/Hip Exercises: Aerobic   Nustep  Seat 9 L2 x 8 min      Knee/Hip Exercises: Standing   Knee Flexion  Strengthening;Right;Left;2 sets;15 reps    Knee Flexion Limitations  tapping toe on step next the to treadmill    SLS  no UE support - 4-7 sec holds    Other Standing Knee Exercises  karaoke 4x each way, hip  flex 8x each side - no UE support - needs close supervision      Knee/Hip Exercises: Seated   Clamshell with TheraBand  Blue   2x10 reps each    Hamstring Curl  Strengthening;Right;Left;20 reps;Limitations    Hamstring Limitations  blue band    Sit to Sand  2 sets;10 reps;without UE support               PT Short Term Goals - 09/28/18 0001      PT SHORT TERM GOAL #1   Title  be independent in initial HEP    Baseline  still having trouble because stomach problems    Status  Partially Met      PT SHORT TERM GOAL #2   Title  Pt will report having gained information regarding silver sneakers to allow for transition to an independent program without lapse in progress.     Baseline  states her insurance doesn't cover it    Status  On-going        PT Long Term Goals - 09/26/18 0001      PT LONG TERM GOAL #1   Title  Pt will demo independence with her advanced HEP to allow for smooth transition to the gym.    Time  6    Period  Weeks    Status  On-going       PT LONG TERM GOAL #2   Title  Pt will be able to maintain single leg stance for atleast 5 sec on each LE, 2/3 trials, to improve stability with stair negotiation at home.     Time  6    Period  Weeks    Status  Partially Met      PT LONG TERM GOAL #3   Title  Pt will complete TUG in less than 14 sec to reflect improvements in dynamic balance and decrease risk of falling in the community.     Time  6    Period  Weeks    Status  On-going      PT LONG TERM GOAL #4   Title  Pt will report 50% improvement in her strength, balance and stamina with daily activity in the community.     Time  6    Period  Weeks    Status  New      PT LONG TERM GOAL #5   Title  Pt will score atleast 19 points on the DGI to reflect improvements in her balance and decreased risk of falling in the community.     Time  6    Period  Weeks    Status  New            Plan - 10/05/18 1349    Clinical Impression Statement  Pt has improved her TUG to <12 sec in average of 3 trial.  She is able to stand on one leg for up to 7 sec bilaterally.  Pt is making progress with balance and strength and would benefit to continue with more skilled PT at this time.    PT Treatment/Interventions  ADLs/Self Care Home Management;Stair training;Gait training;Functional mobility training;Therapeutic activities;Therapeutic exercise;Balance training;Patient/family education;Neuromuscular re-education;Manual techniques    PT Next Visit Plan  progress LE strength (leg press?, sport cord); dynamic balance activity    PT Home Exercise Plan  [id number]     Consulted and Agree with Plan of Care  Patient;Family member/caregiver    Family Member [place]  Patient will benefit from skilled therapeutic intervention in order to improve the following deficits and impairments:  Decreased activity tolerance, Abnormal gait, Decreased balance, Decreased endurance, Decreased strength, Postural dysfunction  Visit Diagnosis: 1.  Unsteadiness on feet   2. Muscle weakness (generalized)   3. Chronic pain of right knee        Problem List Patient Active Problem List   Diagnosis Date Noted  . Post-nasal drip 07/24/2018  . Anxiety 07/04/2018  . Uncontrolled type 2 diabetes mellitus with hyperglycemia ([place]) 06/06/2018  . Chronic fatigue 01/30/2018  . Dyslipidemia 10/17/2017  . Acute eczematoid otitis externa of left ear 07/26/2017  . Fungal dermatitis 07/26/2017  . Cervical spondylosis without myelopathy 05/29/2015  . Cervicogenic headache 05/29/2015  . Obesity 01/14/2015  . OA (osteoarthritis) of knee 09/23/2014  . Precordial chest pain 08/08/2014  . Anemia 07/02/2014  . Depression 06/08/2012  . KNEE PAIN, RIGHT 07/15/2008  . COLONIC POLYPS, HX OF 02/07/2008  . LOW BACK PAIN 08/04/2007  . Hypothyroidism 10/14/2006  . Essential hypertension 10/14/2006  . GERD 10/14/2006  . Osteoarthritis 10/14/2006    Jule Ser, PT 10/05/2018, 1:51 PM  [facility] [address] [place], [place], [address] Phone: [phone]   Fax:  [phone]  Name: Leslie Benton MRN: [id number] Date of Birth: 29-Nov-1940  PHYSICAL THERAPY DISCHARGE SUMMARY  Visits from Start of Care: 7  Current functional level related to goals / functional outcomes: See above goals   Remaining deficits: See above   Education / Equipment: HEP  Plan: Patient agrees to discharge.  Patient goals were partially met. Patient is being discharged due to not returning since the last visit.  ?????    [employer], PT 12/14/18 10:48 AM

## 2018-10-06 MED ORDER — OZEMPIC (0.25 OR 0.5 MG/DOSE) 2 MG/1.5ML ~~LOC~~ SOPN: 0.5000 mg | PEN_INJECTOR | SUBCUTANEOUS | 4 refills | Status: AC

## 2018-10-06 NOTE — Addendum Note (Signed)
Addended by: Abelino Derrick A on: 10/06/2018 11:40 AM   Modules accepted: Orders

## 2018-10-06 NOTE — Telephone Encounter (Signed)
I have sent a rx for ozempic. She will inject a small amount once weekly. I have asked the pharmacy to show her how to do this.

## 2018-10-06 NOTE — Telephone Encounter (Signed)
I left a message with patient's family about the new medication so she can research it herself before starting it.

## 2018-10-11 ENCOUNTER — Other Ambulatory Visit: Payer: Self-pay

## 2018-10-11 ENCOUNTER — Other Ambulatory Visit (INDEPENDENT_AMBULATORY_CARE_PROVIDER_SITE_OTHER): Payer: Medicare Other

## 2018-10-11 DIAGNOSIS — E1165 Type 2 diabetes mellitus with hyperglycemia: Secondary | ICD-10-CM | POA: Diagnosis not present

## 2018-10-11 DIAGNOSIS — E039 Hypothyroidism, unspecified: Secondary | ICD-10-CM

## 2018-10-12 LAB — CBC
HCT: 41.2 % (ref 36.0–46.0)
Hemoglobin: 13.5 g/dL (ref 12.0–15.0)
MCHC: 32.8 g/dL (ref 30.0–36.0)
MCV: 90.6 fl (ref 78.0–100.0)
Platelets: 377 10*3/uL (ref 150.0–400.0)
RBC: 4.54 Mil/uL (ref 3.87–5.11)
RDW: 14.1 % (ref 11.5–15.5)
WBC: 6.6 10*3/uL (ref 4.0–10.5)

## 2018-10-12 LAB — BASIC METABOLIC PANEL
BUN: 19 mg/dL (ref 6–23)
CO2: 24 mEq/L (ref 19–32)
Calcium: 9.5 mg/dL (ref 8.4–10.5)
Chloride: 101 mEq/L (ref 96–112)
Creatinine, Ser: 0.78 mg/dL (ref 0.40–1.20)
GFR: 71.16 mL/min (ref >60.00)
Glucose, Bld: 343 mg/dL — ABNORMAL HIGH (ref 70–99)
Potassium: 4.4 mEq/L (ref 3.5–5.1)
Sodium: 137 mEq/L (ref 135–145)

## 2018-10-12 LAB — HEMOGLOBIN A1C: Hgb A1c MFr Bld: 11.6 % — ABNORMAL HIGH (ref 4.6–6.5)

## 2018-10-12 LAB — TSH: TSH: 5.06 u[IU]/mL — ABNORMAL HIGH (ref 0.35–4.50)

## 2018-10-16 ENCOUNTER — Telehealth: Payer: Self-pay | Admitting: Family Medicine

## 2018-10-16 MED ORDER — ONETOUCH ULTRASOFT LANCETS MISC: 12 refills | Status: DC

## 2018-10-16 MED ORDER — ONETOUCH ULTRA VI STRP: ORAL_STRIP | 12 refills | Status: DC

## 2018-10-16 MED ORDER — LEVOTHYROXINE SODIUM 175 MCG PO TABS: 175.0000 ug | ORAL_TABLET | Freq: Every day | ORAL | 2 refills | Status: DC

## 2018-10-16 MED ORDER — ONETOUCH ULTRA 2 W/DEVICE KIT: PACK | 0 refills | Status: DC

## 2018-10-16 NOTE — Telephone Encounter (Signed)
Pt called and asked if she can have a diabetes test kit sent to [facility] and she has a nurse to help her with the test kit/ please advise / Pt stated she spoke with her insurance and they should cover it/ Pt wants to try to pick it up tomorrow while she has assistance with driving

## 2018-10-16 NOTE — Addendum Note (Signed)
Addended by: Jon Billings on: 10/16/2018 03:16 PM   Modules accepted: Orders

## 2018-10-16 NOTE — Telephone Encounter (Signed)
Rx's sent in so pt can pick up tomorrow.

## 2018-10-19 ENCOUNTER — Other Ambulatory Visit: Payer: Self-pay | Admitting: Family Medicine

## 2018-10-19 DIAGNOSIS — R0982 Postnasal drip: Secondary | ICD-10-CM

## 2018-10-26 ENCOUNTER — Other Ambulatory Visit: Payer: Self-pay | Admitting: Family Medicine

## 2018-10-26 DIAGNOSIS — R5382 Chronic fatigue, unspecified: Secondary | ICD-10-CM

## 2018-10-26 MED ORDER — PHENTERMINE HCL 37.5 MG PO CAPS: 37.5000 mg | ORAL_CAPSULE | ORAL | 0 refills | Status: DC | PRN

## 2018-10-26 NOTE — Telephone Encounter (Signed)
Medication: phentermine 37.5 MG capsule      Patient is requesting refill of this medication.     Pharmacy:  [facility] [address], [place] - [place] [address] [place] [phone] (Phone) [phone] (Fax)

## 2018-10-30 DIAGNOSIS — R197 Diarrhea, unspecified: Secondary | ICD-10-CM | POA: Diagnosis not present

## 2018-10-30 DIAGNOSIS — D509 Iron deficiency anemia, unspecified: Secondary | ICD-10-CM | POA: Diagnosis not present

## 2018-10-30 DIAGNOSIS — K6289 Other specified diseases of anus and rectum: Secondary | ICD-10-CM | POA: Diagnosis not present

## 2018-10-31 DIAGNOSIS — D509 Iron deficiency anemia, unspecified: Secondary | ICD-10-CM | POA: Diagnosis not present

## 2018-10-31 DIAGNOSIS — R197 Diarrhea, unspecified: Secondary | ICD-10-CM | POA: Diagnosis not present

## 2018-11-01 DIAGNOSIS — R197 Diarrhea, unspecified: Secondary | ICD-10-CM | POA: Diagnosis not present

## 2018-11-09 DIAGNOSIS — N762 Acute vulvitis: Secondary | ICD-10-CM | POA: Diagnosis not present

## 2018-11-15 ENCOUNTER — Ambulatory Visit: Payer: Self-pay | Admitting: *Deleted

## 2018-11-15 NOTE — Telephone Encounter (Signed)
Patient will have a new caregiver beginning tomorrow. She is requesting how often and when to check her cbgs for the caregiver. Physician orders read check cbg 1-2 times daily. She takes her diabetic medication early in the morning and eats. The caregiver will be in at 11:00am this is a good time to check her sugar. Check one other time later in the day or evening. Reviewed signs to call for immediate evaluation. Stated she understood. Today, she is concerned about her proctitis and will be calling her GI. She understands she can call back with other questions.  Reason for Disposition . Health Information question, no triage required and triager able to answer question  Answer Assessment - Initial Assessment Questions 1. REASON FOR CALL or QUESTION: "What is your reason for calling today?" or "How can I best help you?" or "What question do you have that I can help answer?"     Patient requesting how often and when to check her sugars.  Protocols used: INFORMATION ONLY CALL - NO TRIAGE-A-AH

## 2018-11-17 ENCOUNTER — Telehealth: Payer: Self-pay | Admitting: Family Medicine

## 2018-11-17 DIAGNOSIS — I10 Essential (primary) hypertension: Secondary | ICD-10-CM

## 2018-11-17 NOTE — Telephone Encounter (Signed)
Copied from [place] [id number]. Topic: Quick Communication - See Telephone Encounter >> Nov 17, 2018  2:21 PM Nils Flack wrote: CRM for notification. See Telephone encounter for: 11/17/18.caregiver caled - she wants to know what the name of the bp medicine is that pt was taking. Please call [phone]

## 2018-11-17 NOTE — Telephone Encounter (Signed)
Agreed -

## 2018-11-17 NOTE — Telephone Encounter (Signed)
I called and spoke with pt's caregiver. I advised her that the BP medication is Inderal 20 mg bid. She advised me that pt's BP is 175/103 and she hasn't been taking her BP medication. I advised her to go ahead and give pt one tablet of the Inderal and to re-check her BP in a little while to give the medicine time to work. I advised if it was still high at that point, to call the office back to be transferred to the triage line. Patient's caregiver verbalized understanding. I went over all of this information with Dr. Ethelene Hal as well. Pt's caregiver is new and is trying to work on getting pt's to take their medications.

## 2018-11-20 NOTE — Telephone Encounter (Signed)
Patient calling about her blood pressure medication asking if propanolol is the correct medication. She says she doesn't know who the caregiver is that Klatt spoke with on 11/17/2018. Patient would like a "fresh" supply of blood pressure medication. Denies any symptoms other than fatigue.

## 2018-11-21 MED ORDER — PROPRANOLOL HCL 20 MG PO TABS: 20.0000 mg | ORAL_TABLET | Freq: Two times a day (BID) | ORAL | 1 refills | Status: DC

## 2018-11-21 NOTE — Addendum Note (Signed)
Addended by: Marrion Coy on: 11/21/2018 10:45 AM   Modules accepted: Orders

## 2018-11-21 NOTE — Telephone Encounter (Signed)
I sent in a refill with a note for the pharmacy to advise pt when it is ready/thx dmf

## 2018-11-30 ENCOUNTER — Telehealth: Payer: Self-pay | Admitting: Family Medicine

## 2018-11-30 NOTE — Telephone Encounter (Signed)
Copied from [place] [id number]. Topic: General - Other >> Nov 30, 2018 10:46 AM Keene Breath wrote: Reason for CRM: Patient's caregiver, Colletta [place], called at the request of patient to find out when should the patient check her BP.  Please advise and call back as soon as possible.  CB# [phone]

## 2018-12-01 NOTE — Telephone Encounter (Signed)
At various times throughout the day. Please note time of day when recording.

## 2018-12-01 NOTE — Telephone Encounter (Signed)
I spoke with pt. She was wanting to know about how many times a day to check her blood sugar. I advised her 1-2 times daily if she can, but at least once a day. She isn't able to check it before breakfast since the caregiver doesn't get there until after she has breakfast. I advised her okay to check it at lunch, about 5-6 hours after eating breakfast. I also advised her to limit her sugar intake. Pt will call back with any additional questions.

## 2018-12-07 ENCOUNTER — Telehealth: Payer: Self-pay | Admitting: Family Medicine

## 2018-12-07 NOTE — Telephone Encounter (Signed)
Copied from [place] [id number]. Topic: General - Other >> Dec 07, 2018 11:54 AM Keene Breath wrote: Reason for CRM: Patient's caregiver called to inform the nurse that the patient's BP is 175/95 and patient did not want to go to the ER.  Please advise and call patient to let her know what should be done. CB# [phone]

## 2018-12-07 NOTE — Telephone Encounter (Signed)
Needs to be seen

## 2018-12-07 NOTE — Telephone Encounter (Signed)
I called and spoke with pt. She is feeling better than she did this morning. They have re-checked her blood pressure and it is back to normal. That reading was from yesterday. She does not want to come in at this time. They will keep an eye on her blood pressure and let us know if it gets elevated again. She is taking her Propranolol as prescribed.

## 2018-12-12 ENCOUNTER — Ambulatory Visit: Payer: Self-pay | Admitting: Family Medicine

## 2018-12-12 NOTE — Telephone Encounter (Signed)
Pt calling to request propanolol be changed to Lisinopril for BP management.  States "Someone told me it was better." States BP today 143/91, yesterday 139/82, on the 17th.... 169/86.  Reports taken by her CNA.  Denies any symptoms other than increased fatigue. Wants to know Dr. Bebe Shaggy thoughts on changing med.  CB# [phone]  Reason for Disposition . [1] Caller has medication question about med not prescribed by PCP AND [2] triager unable to answer question (e.g., compatibility with other med, storage)  Answer Assessment - Initial Assessment Questions 1.   NAME of MEDICATION: "What medicine are you calling about?      Propanolol. 2.   QUESTION: "What is your question?"    "Can we change it to Lisinopril?" 3.   PRESCRIBING HCP: "Who prescribed it?" Reason: if prescribed by specialist, call should be referred to that group.     4. SYMPTOMS: "Do you have any symptoms?"     BP higher than usual 5. SEVERITY: If symptoms are present, ask "Are they mild, moderate or severe?"    Fatigue  Protocols used: MEDICATION QUESTION CALL-A-AH

## 2018-12-13 NOTE — Telephone Encounter (Signed)
Can we get her scheduled? We can do tomorrow or Friday.

## 2018-12-13 NOTE — Telephone Encounter (Signed)
Needs appointment

## 2018-12-13 NOTE — Telephone Encounter (Signed)
I called pt and had to leave a message, I will call back if no response

## 2018-12-14 DIAGNOSIS — N762 Acute vulvitis: Secondary | ICD-10-CM | POA: Diagnosis not present

## 2018-12-14 DIAGNOSIS — N763 Subacute and chronic vulvitis: Secondary | ICD-10-CM | POA: Diagnosis not present

## 2018-12-15 ENCOUNTER — Ambulatory Visit: Payer: Medicare Other | Admitting: Family Medicine

## 2018-12-15 DIAGNOSIS — K6289 Other specified diseases of anus and rectum: Secondary | ICD-10-CM | POA: Diagnosis not present

## 2018-12-19 ENCOUNTER — Encounter: Payer: Self-pay | Admitting: Family Medicine

## 2018-12-19 ENCOUNTER — Ambulatory Visit (INDEPENDENT_AMBULATORY_CARE_PROVIDER_SITE_OTHER): Payer: Medicare Other | Admitting: Family Medicine

## 2018-12-19 VITALS — BP 140/80 | HR 120 | Ht 63.0 in | Wt 165.0 lb

## 2018-12-19 DIAGNOSIS — Z23 Encounter for immunization: Secondary | ICD-10-CM | POA: Diagnosis not present

## 2018-12-19 DIAGNOSIS — I10 Essential (primary) hypertension: Secondary | ICD-10-CM

## 2018-12-19 DIAGNOSIS — E039 Hypothyroidism, unspecified: Secondary | ICD-10-CM

## 2018-12-19 DIAGNOSIS — R4689 Other symptoms and signs involving appearance and behavior: Secondary | ICD-10-CM | POA: Diagnosis not present

## 2018-12-19 LAB — CBC
HCT: 38.5 % (ref 36.0–46.0)
Hemoglobin: 12.2 g/dL (ref 12.0–15.0)
MCHC: 31.7 g/dL (ref 30.0–36.0)
MCV: 81.7 fl (ref 78.0–100.0)
Platelets: 407 10*3/uL — ABNORMAL HIGH (ref 150.0–400.0)
RBC: 4.72 Mil/uL (ref 3.87–5.11)
RDW: 16.2 % — ABNORMAL HIGH (ref 11.5–15.5)
WBC: 6.9 10*3/uL (ref 4.0–10.5)

## 2018-12-19 LAB — TSH: TSH: 4.5 u[IU]/mL (ref 0.35–4.50)

## 2018-12-19 MED ORDER — LISINOPRIL 10 MG PO TABS: 10.0000 mg | ORAL_TABLET | Freq: Every day | ORAL | 3 refills | Status: DC

## 2018-12-19 MED ORDER — LISINOPRIL 10 MG PO TABS: 10.0000 mg | ORAL_TABLET | Freq: Every day | ORAL | 2 refills | Status: DC

## 2018-12-19 NOTE — Addendum Note (Signed)
Addended by: Jon Billings on: 12/19/2018 01:29 PM   Modules accepted: Orders

## 2018-12-19 NOTE — Progress Notes (Addendum)
Established Patient Office Visit  Subjective:  Patient ID: Leslie Benton, female    DOB: [birth year]  Age: 78 y.o. MRN: [id number]  CC:  Chief Complaint  Patient presents with  . Follow-up    HPI Leslie Benton presents for follow-up of her blood pressure.  Her blood pressure at home is been running in the 150-170/80-90 range.  She had been taking lisinopril in the past without issue but it asked her doctor to discontinue it because it had been running normal and she wanted to take fewer pills.  She has had no issues taking lisinopril.  She continues to take the propranolol.  Reports compliance with her levothyroxine at the higher dose.  She takes it every morning on a fasting stomach.  She has been trying to be more compliant with her diabetes medicines but admits that she is not taking them as she should.  Past Medical History:  Diagnosis Date  . Anemia   . Anxiety   . Cervical spondylosis without myelopathy 05/29/2015  . COLONIC POLYPS, HX OF 02/07/2008  . DEGENERATIVE JOINT DISEASE 10/14/2006   03/31/11: Pt states arthritis in her hip.  . Depression   . DIABETES MELLITUS, TYPE II 10/27/2009  . GERD 10/14/2006  . HYPERLIPIDEMIA 10/14/2006  . HYPERTENSION 10/14/2006   off bp meds now  . HYPOTHYROIDISM 10/14/2006  . LOW BACK PAIN 08/04/2007  . Obesity     Past Surgical History:  Procedure Laterality Date  . HEMORRHOID SURGERY    . Left knee arthroscopic  April 2014   Dr. Durward Fortes  . PARTIAL KNEE ARTHROPLASTY Left 09/23/2014   Procedure: LEFT UNICOMPARTMENTAL KNEE;  Surgeon: [name], MD;  Location: [facility];  Service: Orthopedics;  Laterality: Left;  . TONSILLECTOMY    . TUBAL LIGATION      Family History  Problem Relation Age of Onset  . Heart attack Father 4  . Diabetes Father   . Kidney failure Mother   . Cancer Sister        endometrial  . Cancer Brother        Adrenal    Social History   Socioeconomic History  . Marital status: Married    Spouse name: Not  on file  . Number of children: 2  . Years of education: college  . Highest education level: Not on file  Occupational History  . Occupation: retired  Scientific laboratory technician  . Financial resource strain: Not on file  . Food insecurity    Worry: Not on file    Inability: Not on file  . Transportation needs    Medical: Not on file    Non-medical: Not on file  Tobacco Use  . Smoking status: Never Smoker  . Smokeless tobacco: Never Used  . Tobacco comment: Never Used Tobacco  Substance and Sexual Activity  . Alcohol use: No    Alcohol/week: 0.0 standard drinks  . Drug use: No  . Sexual activity: Not on file  Lifestyle  . Physical activity    Days per week: Not on file    Minutes per session: Not on file  . Stress: Not on file  Relationships  . Social Herbalist on phone: Not on file    Gets together: Not on file    Attends religious service: Not on file    Active member of club or organization: Not on file    Attends meetings of clubs or organizations: Not on file    Relationship status:  Not on file  . Intimate partner violence    Fear of current or ex partner: Not on file    Emotionally abused: Not on file    Physically abused: Not on file    Forced sexual activity: Not on file  Other Topics Concern  . Not on file  Social History Narrative   Lives with husband and son.     Patient drinks 1-2 cups of caffeine daily.   Patient is left handed.     Outpatient Medications Prior to Visit  Medication Sig Dispense Refill  . Blood Glucose Monitoring Suppl (ONE TOUCH ULTRA 2) w/Device KIT Use to test blood sugar 1-2 times daily. 1 kit 0  . busPIRone (BUSPAR) 15 MG tablet Take 1 tablet (15 mg total) by mouth daily. 180 tablet 3  . fluticasone (FLONASE) 50 MCG/ACT nasal spray SHAKE LIQUID AND USE 2 SPRAYS IN EACH NOSTRIL DAILY 16 g 2  . glimepiride (AMARYL) 1 MG tablet Take 1 tablet (1 mg total) by mouth daily with breakfast. 90 tablet 3  . glucose blood (ONETOUCH ULTRA) test  strip Use to test blood sugars 1-2 times daily. 100 each 12  . ibuprofen (ADVIL,MOTRIN) 200 MG tablet Take 400 mg by mouth as needed.    . IRON PO Take 1 tablet by mouth daily.    . Lancets (ONETOUCH ULTRASOFT) lancets Use to test blood sugars 1-2 times daily. 100 each 12  . levothyroxine (SYNTHROID) 175 MCG tablet Take 1 tablet (175 mcg total) by mouth daily before breakfast. 30 tablet 2  . loratadine (CLARITIN) 10 MG tablet Take 10 mg by mouth daily.    . mesalamine (CANASA) 1000 MG suppository as needed.   3  . metFORMIN (GLUCOPHAGE) 1000 MG tablet Take 1 tablet (1,000 mg total) by mouth 2 (two) times daily with a meal. 180 tablet 1  . Multiple Vitamins-Minerals (MULTI-VITAMIN GUMMIES PO) Take 2 each by mouth daily.    . phentermine 37.5 MG capsule Take 1 capsule (37.5 mg total) by mouth as needed. 90 capsule 0  . propranolol (INDERAL) 20 MG tablet Take 1 tablet (20 mg total) by mouth 2 (two) times daily. 180 tablet 1   No facility-administered medications prior to visit.     Allergies  Allergen Reactions  . Niacin And Related Hives and Swelling    No anaphalaxis, but strong reactions.  . Lactose Intolerance (Gi)     "bad taste in mouth"  . Amoxicillin Swelling    hives  . Lactase   . Penicillins Swelling    Hives With all cillins    ROS Review of Systems  Constitutional: Negative for chills, diaphoresis, fatigue, fever and unexpected weight change.  Respiratory: Negative for cough, chest tightness and wheezing.   Cardiovascular: Negative for chest pain, palpitations and leg swelling.  Gastrointestinal: Negative.   Endocrine: Negative for cold intolerance, heat intolerance, polyphagia and polyuria.  Genitourinary: Negative.   Musculoskeletal: Positive for gait problem.  Skin: Negative for pallor and rash.  Allergic/Immunologic: Negative for immunocompromised state.  Neurological: Negative for light-headedness and numbness.  Hematological: Does not bruise/bleed easily.   Psychiatric/Behavioral: Negative.       Objective:    Physical Exam  Constitutional: She is oriented to person, place, and time. She appears well-developed and well-nourished. No distress.  HENT:  Head: Atraumatic.  Right Ear: External ear normal.  Left Ear: External ear normal.  Eyes: Conjunctivae are normal. Right eye exhibits no discharge. Left eye exhibits no discharge. No scleral icterus.  Neck: No JVD present. No tracheal deviation present. No thyromegaly present.  Cardiovascular: Normal rate, regular rhythm and normal heart sounds.  Pulmonary/Chest: Effort normal and breath sounds normal. No stridor.  Abdominal: Bowel sounds are normal.  Musculoskeletal:        General: No edema.  Lymphadenopathy:    She has no cervical adenopathy.  Neurological: She is alert and oriented to person, place, and time.  Skin: Skin is warm and dry. She is not diaphoretic.  Psychiatric: She has a normal mood and affect. Her behavior is normal.    BP 140/80   Pulse (!) 120   Ht 5' 3"  (1.6 m)   Wt 165 lb (74.8 kg)   SpO2 96%   BMI 29.23 kg/m  Wt Readings from Last 3 Encounters:  12/19/18 165 lb (74.8 kg)  07/04/18 171 lb (77.6 kg)  06/20/18 170 lb 3.2 oz (77.2 kg)   BP Readings from Last 3 Encounters:  12/19/18 140/80  07/04/18 130/80  06/20/18 120/78   Guideline developer:  UpToDate (see UpToDate for funding source) Date Released: June 2014  Health Maintenance Due  Topic Date Due  . DEXA SCAN  04/17/2004  . OPHTHALMOLOGY EXAM  05/03/2015    There are no preventive care reminders to display for this patient.  Lab Results  Component Value Date   TSH 4.50 12/19/2018   Lab Results  Component Value Date   WBC 6.9 12/19/2018   HGB 12.2 12/19/2018   HCT 38.5 12/19/2018   MCV 81.7 12/19/2018   PLT 407.0 (H) 12/19/2018   Lab Results  Component Value Date   NA 137 10/11/2018   K 4.4 10/11/2018   CO2 24 10/11/2018   GLUCOSE 343 (H) 10/11/2018   BUN 19 10/11/2018    CREATININE 0.78 10/11/2018   BILITOT 0.3 07/30/2016   ALKPHOS 87 07/30/2016   AST 13 07/30/2016   ALT 15 07/30/2016   PROT 7.2 07/30/2016   ALBUMIN 4.4 07/30/2016   CALCIUM 9.5 10/11/2018   ANIONGAP 10 09/25/2014   GFR 71.16 10/11/2018   Lab Results  Component Value Date   CHOL 282 (H) 08/05/2015   Lab Results  Component Value Date   HDL 54.10 08/05/2015   Lab Results  Component Value Date   LDLCALC 111 (H) 07/03/2014   Lab Results  Component Value Date   TRIG 307.0 (H) 08/05/2015   Lab Results  Component Value Date   CHOLHDL 5 08/05/2015   Lab Results  Component Value Date   HGBA1C 11.6 (H) 10/11/2018      Assessment & Plan:   Problem List Items Addressed This Visit      Cardiovascular and Mediastinum   Essential hypertension - Primary   Relevant Medications   lisinopril (ZESTRIL) 10 MG tablet   Other Relevant Orders   CBC (Completed)     Endocrine   Hypothyroidism   Relevant Orders   TSH (Completed)     Other   Non-compliant behavior   Need for influenza vaccination   Relevant Orders   Flu Vaccine QUAD High Dose(Fluad) (Completed)      Meds ordered this encounter  Medications  . DISCONTD: lisinopril (ZESTRIL) 10 MG tablet    Sig: Take 1 tablet (10 mg total) by mouth daily.    Dispense:  90 tablet    Refill:  3  . lisinopril (ZESTRIL) 10 MG tablet    Sig: Take 1 tablet (10 mg total) by mouth daily.    Dispense:  30 tablet  Refill:  2    Follow-up: Return in about 1 month (around 01/18/2019).   Rechecking TSH today and adding lisinopril 10 mg.  Follow-up in 1 month to recheck her blood pressure.  Anticipate increasing lisinopril to 20 mg after checking a BMP.  Encouraged patient to take all of her medicines as directed

## 2018-12-19 NOTE — Addendum Note (Signed)
Addended by: Nathanial Millman E on: 12/19/2018 01:40 PM   Modules accepted: Orders

## 2018-12-20 DIAGNOSIS — Z23 Encounter for immunization: Secondary | ICD-10-CM | POA: Insufficient documentation

## 2018-12-20 DIAGNOSIS — R4689 Other symptoms and signs involving appearance and behavior: Secondary | ICD-10-CM | POA: Insufficient documentation

## 2019-01-05 ENCOUNTER — Inpatient Hospital Stay (HOSPITAL_COMMUNITY)
Admission: EM | Admit: 2019-01-05 | Discharge: 2019-01-14 | DRG: 386 | Disposition: A | Discharge status: Skilled Nursing Facility | Payer: Medicare Other | Attending: Internal Medicine | Admitting: Internal Medicine

## 2019-01-05 ENCOUNTER — Emergency Department (HOSPITAL_COMMUNITY): Payer: Medicare Other

## 2019-01-05 ENCOUNTER — Encounter (HOSPITAL_COMMUNITY): Payer: Self-pay

## 2019-01-05 ENCOUNTER — Other Ambulatory Visit: Payer: Self-pay

## 2019-01-05 DIAGNOSIS — K51911 Ulcerative colitis, unspecified with rectal bleeding: Secondary | ICD-10-CM | POA: Diagnosis not present

## 2019-01-05 DIAGNOSIS — F329 Major depressive disorder, single episode, unspecified: Secondary | ICD-10-CM | POA: Diagnosis present

## 2019-01-05 DIAGNOSIS — E1165 Type 2 diabetes mellitus with hyperglycemia: Secondary | ICD-10-CM | POA: Diagnosis not present

## 2019-01-05 DIAGNOSIS — R197 Diarrhea, unspecified: Secondary | ICD-10-CM | POA: Diagnosis not present

## 2019-01-05 DIAGNOSIS — R000 Tachycardia, unspecified: Secondary | ICD-10-CM | POA: Diagnosis not present

## 2019-01-05 DIAGNOSIS — I491 Atrial premature depolarization: Secondary | ICD-10-CM | POA: Diagnosis not present

## 2019-01-05 DIAGNOSIS — R739 Hyperglycemia, unspecified: Secondary | ICD-10-CM

## 2019-01-05 DIAGNOSIS — T380X5A Adverse effect of glucocorticoids and synthetic analogues, initial encounter: Secondary | ICD-10-CM | POA: Diagnosis present

## 2019-01-05 DIAGNOSIS — K644 Residual hemorrhoidal skin tags: Secondary | ICD-10-CM | POA: Diagnosis present

## 2019-01-05 DIAGNOSIS — Y92239 Unspecified place in hospital as the place of occurrence of the external cause: Secondary | ICD-10-CM | POA: Diagnosis not present

## 2019-01-05 DIAGNOSIS — I248 Other forms of acute ischemic heart disease: Secondary | ICD-10-CM | POA: Diagnosis present

## 2019-01-05 DIAGNOSIS — G8929 Other chronic pain: Secondary | ICD-10-CM | POA: Diagnosis present

## 2019-01-05 DIAGNOSIS — Z841 Family history of disorders of kidney and ureter: Secondary | ICD-10-CM

## 2019-01-05 DIAGNOSIS — K449 Diaphragmatic hernia without obstruction or gangrene: Secondary | ICD-10-CM | POA: Diagnosis not present

## 2019-01-05 DIAGNOSIS — K648 Other hemorrhoids: Secondary | ICD-10-CM | POA: Diagnosis present

## 2019-01-05 DIAGNOSIS — E785 Hyperlipidemia, unspecified: Secondary | ICD-10-CM | POA: Diagnosis present

## 2019-01-05 DIAGNOSIS — Z7984 Long term (current) use of oral hypoglycemic drugs: Secondary | ICD-10-CM

## 2019-01-05 DIAGNOSIS — E039 Hypothyroidism, unspecified: Secondary | ICD-10-CM | POA: Diagnosis present

## 2019-01-05 DIAGNOSIS — K6289 Other specified diseases of anus and rectum: Secondary | ICD-10-CM | POA: Diagnosis not present

## 2019-01-05 DIAGNOSIS — I4891 Unspecified atrial fibrillation: Secondary | ICD-10-CM | POA: Diagnosis present

## 2019-01-05 DIAGNOSIS — I959 Hypotension, unspecified: Secondary | ICD-10-CM | POA: Diagnosis not present

## 2019-01-05 DIAGNOSIS — K529 Noninfective gastroenteritis and colitis, unspecified: Secondary | ICD-10-CM | POA: Diagnosis not present

## 2019-01-05 DIAGNOSIS — I471 Supraventricular tachycardia: Secondary | ICD-10-CM | POA: Diagnosis not present

## 2019-01-05 DIAGNOSIS — R5383 Other fatigue: Secondary | ICD-10-CM | POA: Diagnosis not present

## 2019-01-05 DIAGNOSIS — Z20828 Contact with and (suspected) exposure to other viral communicable diseases: Secondary | ICD-10-CM | POA: Diagnosis not present

## 2019-01-05 DIAGNOSIS — Z79899 Other long term (current) drug therapy: Secondary | ICD-10-CM

## 2019-01-05 DIAGNOSIS — Z96652 Presence of left artificial knee joint: Secondary | ICD-10-CM | POA: Diagnosis present

## 2019-01-05 DIAGNOSIS — D649 Anemia, unspecified: Secondary | ICD-10-CM | POA: Diagnosis present

## 2019-01-05 DIAGNOSIS — R52 Pain, unspecified: Secondary | ICD-10-CM | POA: Diagnosis not present

## 2019-01-05 DIAGNOSIS — F419 Anxiety disorder, unspecified: Secondary | ICD-10-CM | POA: Diagnosis present

## 2019-01-05 DIAGNOSIS — E739 Lactose intolerance, unspecified: Secondary | ICD-10-CM | POA: Diagnosis present

## 2019-01-05 DIAGNOSIS — Z880 Allergy status to penicillin: Secondary | ICD-10-CM

## 2019-01-05 DIAGNOSIS — R778 Other specified abnormalities of plasma proteins: Secondary | ICD-10-CM | POA: Diagnosis present

## 2019-01-05 DIAGNOSIS — I2510 Atherosclerotic heart disease of native coronary artery without angina pectoris: Secondary | ICD-10-CM | POA: Diagnosis present

## 2019-01-05 DIAGNOSIS — E860 Dehydration: Secondary | ICD-10-CM

## 2019-01-05 DIAGNOSIS — I10 Essential (primary) hypertension: Secondary | ICD-10-CM | POA: Diagnosis present

## 2019-01-05 DIAGNOSIS — K219 Gastro-esophageal reflux disease without esophagitis: Secondary | ICD-10-CM | POA: Diagnosis present

## 2019-01-05 DIAGNOSIS — Z888 Allergy status to other drugs, medicaments and biological substances status: Secondary | ICD-10-CM

## 2019-01-05 DIAGNOSIS — Z8249 Family history of ischemic heart disease and other diseases of the circulatory system: Secondary | ICD-10-CM

## 2019-01-05 DIAGNOSIS — Z791 Long term (current) use of non-steroidal anti-inflammatories (NSAID): Secondary | ICD-10-CM

## 2019-01-05 DIAGNOSIS — Z86010 Personal history of colonic polyps: Secondary | ICD-10-CM

## 2019-01-05 DIAGNOSIS — Z833 Family history of diabetes mellitus: Secondary | ICD-10-CM

## 2019-01-05 LAB — HEPATIC FUNCTION PANEL
ALT: 12 U/L (ref 0–44)
AST: 9 U/L — ABNORMAL LOW (ref 15–41)
Albumin: 3 g/dL — ABNORMAL LOW (ref 3.5–5.0)
Alkaline Phosphatase: 118 U/L (ref 38–126)
Bilirubin, Direct: 0.1 mg/dL (ref 0.0–0.2)
Indirect Bilirubin: 0.3 mg/dL (ref 0.3–0.9)
Total Bilirubin: 0.4 mg/dL (ref 0.3–1.2)
Total Protein: 6.6 g/dL (ref 6.5–8.1)

## 2019-01-05 LAB — URINALYSIS, ROUTINE W REFLEX MICROSCOPIC
Bacteria, UA: NONE SEEN
Bilirubin Urine: NEGATIVE
Glucose, UA: >=500 mg/dL — AB
Hgb urine dipstick: NEGATIVE
Ketones, ur: 20 mg/dL — AB
Leukocytes,Ua: NEGATIVE
Nitrite: NEGATIVE
Protein, ur: NEGATIVE mg/dL
Specific Gravity, Urine: 1.032 — ABNORMAL HIGH (ref 1.005–1.030)
pH: 5 (ref 5.0–8.0)

## 2019-01-05 LAB — BASIC METABOLIC PANEL
Anion gap: 13 (ref 5–15)
BUN: 26 mg/dL — ABNORMAL HIGH (ref 8–23)
CO2: 22 mmol/L (ref 22–32)
Calcium: 8.5 mg/dL — ABNORMAL LOW (ref 8.9–10.3)
Chloride: 98 mmol/L (ref 98–111)
Creatinine, Ser: 0.93 mg/dL (ref 0.44–1.00)
GFR calc Af Amer: >60 mL/min (ref >60)
GFR calc non Af Amer: 58 mL/min — ABNORMAL LOW (ref >60)
Glucose, Bld: 578 mg/dL (ref 70–99)
Potassium: 5.3 mmol/L — ABNORMAL HIGH (ref 3.5–5.1)
Sodium: 133 mmol/L — ABNORMAL LOW (ref 135–145)

## 2019-01-05 LAB — CBC
HCT: 43 % (ref 36.0–46.0)
Hemoglobin: 13 g/dL (ref 12.0–15.0)
MCH: 25.2 pg — ABNORMAL LOW (ref 26.0–34.0)
MCHC: 30.2 g/dL (ref 30.0–36.0)
MCV: 83.3 fL (ref 80.0–100.0)
Platelets: 407 10*3/uL — ABNORMAL HIGH (ref 150–400)
RBC: 5.16 MIL/uL — ABNORMAL HIGH (ref 3.87–5.11)
RDW: 16.6 % — ABNORMAL HIGH (ref 11.5–15.5)
WBC: 15 10*3/uL — ABNORMAL HIGH (ref 4.0–10.5)
nRBC: 0 % (ref 0.0–0.2)

## 2019-01-05 LAB — SARS CORONAVIRUS 2 BY RT PCR (HOSPITAL ORDER, PERFORMED IN ~~LOC~~ HOSPITAL LAB): SARS Coronavirus 2: NEGATIVE

## 2019-01-05 LAB — CBG MONITORING, ED
Glucose-Capillary: 303 mg/dL — ABNORMAL HIGH (ref 70–99)
Glucose-Capillary: 534 mg/dL (ref 70–99)

## 2019-01-05 LAB — LIPASE, BLOOD: Lipase: 27 U/L (ref 11–51)

## 2019-01-05 MED ORDER — INSULIN ASPART 100 UNIT/ML IV SOLN
10.0000 [IU] | Freq: Once | INTRAVENOUS | Status: AC
  Administered 2019-01-05: 21:00:00 10 [IU] via INTRAVENOUS
  Filled 2019-01-05: qty 0.1

## 2019-01-05 MED ORDER — SODIUM CHLORIDE 0.9 % IV BOLUS
1000.0000 mL | Freq: Once | INTRAVENOUS | Status: AC
  Administered 2019-01-05: 21:00:00 1000 mL via INTRAVENOUS

## 2019-01-05 MED ORDER — METRONIDAZOLE IN NACL 5-0.79 MG/ML-% IV SOLN
500.0000 mg | Freq: Once | INTRAVENOUS | Status: AC
  Administered 2019-01-05: 22:00:00 500 mg via INTRAVENOUS
  Filled 2019-01-05: qty 100

## 2019-01-05 MED ORDER — IOHEXOL 300 MG/ML  SOLN
100.0000 mL | Freq: Once | INTRAMUSCULAR | Status: AC | PRN
  Administered 2019-01-05: 21:00:00 100 mL via INTRAVENOUS

## 2019-01-05 MED ORDER — INSULIN ASPART 100 UNIT/ML ~~LOC~~ SOLN
0.0000 [IU] | SUBCUTANEOUS | Status: DC
  Administered 2019-01-06: 7 [IU] via SUBCUTANEOUS
  Administered 2019-01-06: 9 [IU] via SUBCUTANEOUS
  Administered 2019-01-06: 2 [IU] via SUBCUTANEOUS
  Administered 2019-01-06: 5 [IU] via SUBCUTANEOUS
  Administered 2019-01-06: 08:00:00 2 [IU] via SUBCUTANEOUS
  Administered 2019-01-06: 9 [IU] via SUBCUTANEOUS
  Administered 2019-01-07: 3 [IU] via SUBCUTANEOUS
  Administered 2019-01-07: 5 [IU] via SUBCUTANEOUS
  Administered 2019-01-07: 2 [IU] via SUBCUTANEOUS
  Administered 2019-01-07: 9 [IU] via SUBCUTANEOUS
  Administered 2019-01-07: 09:00:00 1 [IU] via SUBCUTANEOUS
  Administered 2019-01-08: 9 [IU] via SUBCUTANEOUS
  Administered 2019-01-08 (×2): 1 [IU] via SUBCUTANEOUS
  Administered 2019-01-08: 3 [IU] via SUBCUTANEOUS
  Administered 2019-01-08: 9 [IU] via SUBCUTANEOUS
  Administered 2019-01-08: 3 [IU] via SUBCUTANEOUS
  Administered 2019-01-09: 1 [IU] via SUBCUTANEOUS
  Administered 2019-01-09: 7 [IU] via SUBCUTANEOUS
  Administered 2019-01-09: 17:00:00 2 [IU] via SUBCUTANEOUS
  Administered 2019-01-09: 5 [IU] via SUBCUTANEOUS
  Administered 2019-01-09: 2 [IU] via SUBCUTANEOUS
  Administered 2019-01-09: 1 [IU] via SUBCUTANEOUS
  Administered 2019-01-10: 18:00:00 9 [IU] via SUBCUTANEOUS
  Administered 2019-01-10: 2 [IU] via SUBCUTANEOUS
  Administered 2019-01-10: 5 [IU] via SUBCUTANEOUS
  Administered 2019-01-10: 7 [IU] via SUBCUTANEOUS
  Administered 2019-01-10: 21:00:00 5 [IU] via SUBCUTANEOUS
  Administered 2019-01-10: 3 [IU] via SUBCUTANEOUS
  Administered 2019-01-11: 1 [IU] via SUBCUTANEOUS
  Administered 2019-01-11: 7 [IU] via SUBCUTANEOUS
  Administered 2019-01-11: 05:00:00 3 [IU] via SUBCUTANEOUS
  Administered 2019-01-11: 9 [IU] via SUBCUTANEOUS
  Administered 2019-01-11: 5 [IU] via SUBCUTANEOUS
  Administered 2019-01-12: 1 [IU] via SUBCUTANEOUS
  Administered 2019-01-12 (×2): 2 [IU] via SUBCUTANEOUS
  Administered 2019-01-12: 7 [IU] via SUBCUTANEOUS
  Administered 2019-01-13: 9 [IU] via SUBCUTANEOUS
  Administered 2019-01-13: 5 [IU] via SUBCUTANEOUS
  Filled 2019-01-05: qty 0.09

## 2019-01-05 MED ORDER — SODIUM CHLORIDE (PF) 0.9 % IJ SOLN
INTRAMUSCULAR | Status: AC
  Filled 2019-01-05: qty 50

## 2019-01-05 MED ORDER — CIPROFLOXACIN IN D5W 400 MG/200ML IV SOLN
400.0000 mg | Freq: Once | INTRAVENOUS | Status: AC
  Administered 2019-01-05: 400 mg via INTRAVENOUS
  Filled 2019-01-05: qty 200

## 2019-01-05 MED ORDER — SODIUM CHLORIDE 0.9 % IV SOLN
INTRAVENOUS | Status: AC
  Administered 2019-01-06 (×3): via INTRAVENOUS

## 2019-01-05 NOTE — H&P (Signed)
TRH H&P    Patient Demographics:    Leslie Benton, is a 78 y.o. female  MRN: [id number]  DOB - Feb 14, 1941  Admit Date - 01/05/2019  Referring MD/NP/PA:    Julianne Rice  Outpatient Primary MD for the patient is [name], MD Dr. Cristina Gong - GI  Patient coming from:  home  Chief complaint- diarrhea   HPI:    Leslie Benton  is a 78 y.o. female, w anxiety/depression, chronic lower back pain, hypertension, hyperlipidemia, Dm2, Hypothyroidism, hemorrhage of the anus ?, apparently presents with c/o diarrhea for the past week. Liquid stool about 5x per day. Sometimes blood.   Pt denies fever, chills, cp, palp, sob, n/v, abd pain, black stool.    In ED,  T 97.8 P 133, R 20, Bp 145/80  Pox 97% on RA  CT abd/ pelvis IMPRESSION: 1. There is diffuse colonic wall thickening and vascular combing most conspicuous involving the transverse colon to the rectum. Fluid-filled distal colon and rectum. Findings are consistent with nonspecific infectious, inflammatory, or ischemic colitis.  2.  Hiatal hernia.  Na 133, K 5.3, Bun 26, Creatinine 0.93 Alb 3.0 Ast 9, Alt 12  Glucose 578 Wbc 15.0, Hgb 13.0, Plt 407 Urinalysis negative  Covid -19 negative  C. Diff pending  Pt will be admitted for diarrhea, and hyperglycemia.     Review of systems:    In addition to the HPI above,  No Fever-chills, No Headache, No changes with Vision or hearing, No problems swallowing food or Liquids, No Chest pain, Cough or Shortness of Breath, No Abdominal pain, No Nausea or Vomiting,  No Blood in Urine, No dysuria, No new skin rashes or bruises, No new joints pains-aches,  No new weakness, tingling, numbness in any extremity, No recent weight gain or loss, No polyuria, polydypsia or polyphagia, No significant Mental Stressors.  All other systems reviewed and are negative.    Past History of the following  :    Past Medical History:  Diagnosis Date  . Anemia   . Anxiety   . Cervical spondylosis without myelopathy 05/29/2015  . COLONIC POLYPS, HX OF 02/07/2008  . DEGENERATIVE JOINT DISEASE 10/14/2006   03/31/11: Pt states arthritis in her hip.  . Depression   . DIABETES MELLITUS, TYPE II 10/27/2009  . GERD 10/14/2006  . HYPERLIPIDEMIA 10/14/2006  . HYPERTENSION 10/14/2006   off bp meds now  . HYPOTHYROIDISM 10/14/2006  . LOW BACK PAIN 08/04/2007  . Obesity       Past Surgical History:  Procedure Laterality Date  . HEMORRHOID SURGERY    . Left knee arthroscopic  April 2014   Dr. Durward Fortes  . PARTIAL KNEE ARTHROPLASTY Left 09/23/2014   Procedure: LEFT UNICOMPARTMENTAL KNEE;  Surgeon: [name], MD;  Location: [facility];  Service: Orthopedics;  Laterality: Left;  . TONSILLECTOMY    . TUBAL LIGATION        Social History:      Social History   Tobacco Use  . Smoking status: Never Smoker  . Smokeless tobacco: Never  Used  . Tobacco comment: Never Used Tobacco  Substance Use Topics  . Alcohol use: No    Alcohol/week: 0.0 standard drinks       Family History :     Family History  Problem Relation Age of Onset  . Heart attack Father 6  . Diabetes Father   . Kidney failure Mother   . Cancer Sister        endometrial  . Cancer Brother        Adrenal       Home Medications:   Prior to Admission medications   Medication Sig Start Date End Date Taking? Authorizing Provider  busPIRone (BUSPAR) 15 MG tablet Take 1 tablet (15 mg total) by mouth daily. Patient taking differently: Take 15 mg by mouth daily as needed (depression).  07/04/18  Yes [name], MD  fluticasone (FLONASE) 50 MCG/ACT nasal spray SHAKE LIQUID AND USE 2 SPRAYS IN EACH NOSTRIL DAILY Patient taking differently: Place 2 sprays into both nostrils daily.  10/19/18  Yes [name], MD  ibuprofen (ADVIL,MOTRIN) 200 MG tablet Take 400 mg by mouth as needed for headache or moderate pain.     Yes [provider]  levothyroxine (SYNTHROID) 175 MCG tablet Take 1 tablet (175 mcg total) by mouth daily before breakfast. 10/16/18  Yes [name], MD  lisinopril (ZESTRIL) 10 MG tablet Take 1 tablet (10 mg total) by mouth daily. 12/19/18  Yes [name], MD  loratadine (CLARITIN) 10 MG tablet Take 10 mg by mouth daily as needed for allergies or rhinitis.    Yes [provider]  mesalamine (CANASA) 1000 MG suppository Place 1,000 mg rectally as needed (diarrhea).  12/28/17  Yes [provider]  metFORMIN (GLUCOPHAGE) 1000 MG tablet Take 1 tablet (1,000 mg total) by mouth 2 (two) times daily with a meal. 07/04/18  Yes [name], MD  phentermine 37.5 MG capsule Take 1 capsule (37.5 mg total) by mouth as needed. 10/26/18  Yes [name], MD  propranolol (INDERAL) 20 MG tablet Take 1 tablet (20 mg total) by mouth 2 (two) times daily. Patient taking differently: Take 20 mg by mouth daily as needed (hypertension).  11/21/18  Yes [name], MD  Blood Glucose Monitoring Suppl (ONE TOUCH ULTRA 2) w/Device KIT Use to test blood sugar 1-2 times daily. 10/16/18   [name], MD  glimepiride (AMARYL) 1 MG tablet Take 1 tablet (1 mg total) by mouth daily with breakfast. Patient not taking: Reported on 01/05/2019 06/20/18   Renato Shin, MD  glucose blood (ONETOUCH ULTRA) test strip Use to test blood sugars 1-2 times daily. 10/16/18   [name], MD  Lancets [facility] ULTRASOFT) lancets Use to test blood sugars 1-2 times daily. 10/16/18   [name], MD     Allergies:     Allergies  Allergen Reactions  . Niacin And Related Hives and Swelling    No anaphalaxis, but strong reactions.  . Lactose Intolerance (Gi)     "bad taste in mouth"  . Amoxicillin Swelling    hives  . Lactase   . Penicillins Swelling    Hives With all cillins     Physical Exam:   Vitals  Blood pressure 140/81,  pulse (!) 110, temperature 97.8 F (36.6 C), resp. rate 18, height _0  (1.6 m), weight 74.8 kg, SpO2 97 %.  1.  General: axoxo3  2. Psychiatric: euthymic  3. Neurologic: cn2-12 intact, reflexes 2+  symmetric, diffuse with no clonus, motor 5/5 in all 4 ext  4. HEENMT:  Anicteric, pupils 1.29m symmetric, direct, consensual intact Neck: no jvd  5. Respiratory : CTAB  6. Cardiovascular : rrr s1, s2,   7. Gastrointestinal:  Abd: soft, nt, nd, +bs  8. Skin:  Ext: no c/c/e, no rash  9.Musculoskeletal:  Good ROM,     Data Review:    CBC Recent Labs  Lab 01/05/19 1939  WBC 15.0*  HGB 13.0  HCT 43.0  PLT 407*  MCV 83.3  MCH 25.2*  MCHC 30.2  RDW 16.6*   ------------------------------------------------------------------------------------------------------------------  Results for orders placed or performed during the hospital encounter of 01/05/19 (from the past 48 hour(s))  CBG monitoring, ED     Status: Abnormal   Collection Time: 01/05/19  7:36 PM  Result Value Ref Range   Glucose-Capillary 534 (HH) 70 - 99 mg/dL  Basic metabolic panel     Status: Abnormal   Collection Time: 01/05/19  7:39 PM  Result Value Ref Range   Sodium 133 (L) 135 - 145 mmol/L   Potassium 5.3 (H) 3.5 - 5.1 mmol/L   Chloride 98 98 - 111 mmol/L   CO2 22 22 - 32 mmol/L   Glucose, Bld 578 (HH) 70 - 99 mg/dL    Comment: CRITICAL RESULT CALLED TO, READ BACK BY AND VERIFIED WITH: TEETER,Z RN _0  ON 01/05/2019 JACKSON,K    BUN 26 (H) 8 - 23 mg/dL   Creatinine, Ser 0.93 0.44 - 1.00 mg/dL   Calcium 8.5 (L) 8.9 - 10.3 mg/dL   GFR calc non Af Amer 58 (L) >60 mL/min   GFR calc Af Amer >60 >60 mL/min   Anion gap 13 5 - 15    Comment: [facility] [address], [place] [address] CBC     Status: Abnormal   Collection Time: 01/05/19  7:39 PM  Result Value Ref Range   WBC 15.0 (H) 4.0 - 10.5 K/uL   RBC 5.16 (H) 3.87 - 5.11 MIL/uL   Hemoglobin 13.0  12.0 - 15.0 g/dL   HCT 43.0 36.0 - 46.0 %   MCV 83.3 80.0 - 100.0 fL   MCH 25.2 (L) 26.0 - 34.0 pg   MCHC 30.2 30.0 - 36.0 g/dL   RDW 16.6 (H) 11.5 - 15.5 %   Platelets 407 (H) 150 - 400 K/uL   nRBC 0.0 0.0 - 0.2 %    Comment: Performed at [facility] [address] [place] [address] Hepatic function panel     Status: Abnormal   Collection Time: 01/05/19  7:39 PM  Result Value Ref Range   Total Protein 6.6 6.5 - 8.1 g/dL   Albumin 3.0 (L) 3.5 - 5.0 g/dL   AST 9 (L) 15 - 41 U/L   ALT 12 0 - 44 U/L   Alkaline Phosphatase 118 38 - 126 U/L   Total Bilirubin 0.4 0.3 - 1.2 mg/dL   Bilirubin, Direct 0.1 0.0 - 0.2 mg/dL   Indirect Bilirubin 0.3 0.3 - 0.9 mg/dL    Comment: Performed at [facility] [address], [id number] Lipase, blood     Status: None   Collection Time: 01/05/19  7:39 PM  Result Value Ref Range   Lipase 27 11 - 51 U/L    Comment: [facility]formed at [facility] [address] [place]  [address] Urinalysis, Routine w  reflex microscopic     Status: Abnormal   Collection Time: 01/05/19  9:05 PM  Result Value Ref Range   Color, Urine YELLOW YELLOW   APPearance CLEAR CLEAR   Specific Gravity, Urine 1.032 (H) 1.005 - 1.030   pH 5.0 5.0 - 8.0   Glucose, UA >=500 (A) NEGATIVE mg/dL   Hgb urine dipstick NEGATIVE NEGATIVE   Bilirubin Urine NEGATIVE NEGATIVE   Ketones, ur 20 (A) NEGATIVE mg/dL   Protein, ur NEGATIVE NEGATIVE mg/dL   Nitrite NEGATIVE NEGATIVE   Leukocytes,Ua NEGATIVE NEGATIVE   RBC / HPF 0-5 0 - 5 RBC/hpf   WBC, UA 0-5 0 - 5 WBC/hpf   Bacteria, UA NONE SEEN NONE SEEN   Mucus PRESENT     Comment: Performed at [facility], [place] [address], [place], [place] [address]    Chemistries  Recent Labs  Lab 01/05/19 1939  NA 133*  K 5.3*  CL 98  CO2 22  GLUCOSE 578*  BUN 26*  CREATININE 0.93  CALCIUM 8.5*  AST 9*  ALT 12  ALKPHOS 118   BILITOT 0.4   ------------------------------------------------------------------------------------------------------------------  ------------------------------------------------------------------------------------------------------------------ GFR: Estimated Creatinine Clearance: 47.5 mL/min (by C-G formula based on SCr of 0.93 mg/dL). Liver Function Tests: Recent Labs  Lab 01/05/19 1939  AST 9*  ALT 12  ALKPHOS 118  BILITOT 0.4  PROT 6.6  ALBUMIN 3.0*   Recent Labs  Lab 01/05/19 1939  LIPASE 27   No results for input(s): AMMONIA in the last 168 hours. Coagulation Profile: No results for input(s): INR, PROTIME in the last 168 hours. Cardiac Enzymes: No results for input(s): CKTOTAL, CKMB, CKMBINDEX, TROPONINI in the last 168 hours. BNP (last 3 results) No results for input(s): PROBNP in the last 8760 hours. HbA1C: No results for input(s): HGBA1C in the last 72 hours. CBG: Recent Labs  Lab 01/05/19 1936  GLUCAP 534*   Lipid Profile: No results for input(s): CHOL, HDL, LDLCALC, TRIG, CHOLHDL, LDLDIRECT in the last 72 hours. Thyroid Function Tests: No results for input(s): TSH, T4TOTAL, FREET4, T3FREE, THYROIDAB in the last 72 hours. Anemia Panel: No results for input(s): VITAMINB12, FOLATE, FERRITIN, TIBC, IRON, RETICCTPCT in the last 72 hours.  --------------------------------------------------------------------------------------------------------------- Urine analysis:    Component Value Date/Time   COLORURINE YELLOW 01/05/2019 2105   APPEARANCEUR CLEAR 01/05/2019 2105   LABSPEC 1.032 (H) 01/05/2019 2105   PHURINE 5.0 01/05/2019 2105   GLUCOSEU >=500 (A) 01/05/2019 2105   HGBUR NEGATIVE 01/05/2019 2105   BILIRUBINUR NEGATIVE 01/05/2019 2105   KETONESUR 20 (A) 01/05/2019 2105   PROTEINUR NEGATIVE 01/05/2019 2105   UROBILINOGEN 0.2 09/17/2014 1516   NITRITE NEGATIVE 01/05/2019 2105   LEUKOCYTESUR NEGATIVE 01/05/2019 2105   St at [address], [place],    Imaging  Results:    No results found.     Assessment & Plan:    Principal Problem:   Diarrhea Active Problems:   Hypothyroidism   Essential hypertension   GERD   Anemia   Tachycardia  Diarrhea, ? Colitis GI pathogen panel C. Diff Check celiac panel Cipro iv, Flagyl iv pharmacy to dose  Blood in stool  ? Chronic issue, seen in past by Buccini Consider GI consultation in AM  Tachycardia Tele Trop I q3h x2 D dimer, if positive then CTA chest Check tsh Check cardiac echo   Hypertension Cont Lisinopril 90m po qday Cont Propranolol 261mpo bid  Dm2 FSBS q4h, ISS Hold Amaryl STOP Metformin secondary to diarrhea Start Levemir 10 units [place]  qday  Hypothyroidism Cont Levothyroxine175 micrograms po qday  DVT Prophylaxis-   Lovenox - SCDs   AM Labs Ordered, also please review Full Orders  Family Communication: Admission, patients condition and plan of care including tests being ordered have been discussed with the patient who indicate understanding and agree with the plan and Code Status.  Code Status:  FULL CODE per patient, notified husband Shanon Brow that patient admitted to [facility]  Admission status: Observation: Based on patients clinical presentation and evaluation of above clinical data, I have made determination that patient meets observation criteria at this time. Pt will require iv abx, and iv insulin for hyperglycemia, pt has high risk of clinical deterioration, and will require admission.   Time spent in minutes : 70 minutes   Jani Gravel M.D on 01/05/2019 at 9:57 PM

## 2019-01-05 NOTE — ED Notes (Signed)
Pt transported to CT ?

## 2019-01-05 NOTE — ED Triage Notes (Signed)
Per EMS, Pt is coming from home. Pt called EMS due to Diarrhea for 1 wk without vomiting, and with increased weakness. On EMS arrival Pts CBG read Hi. Pt also complaining of abd pain.

## 2019-01-05 NOTE — ED Provider Notes (Signed)
[facility] DEPT Provider Note   CSN: [id number] Arrival date & time: 01/05/19  1834     History   Chief Complaint Chief Complaint  Patient presents with  . Hyperglycemia  . Diarrhea    HPI Leslie Benton is a 78 y.o. female.     HPI Patient presents from home with 1 week of watery diarrhea.  States she is having abdominal pain as well.  No nausea or vomiting.  States she is had generalized weakness.  No definite fever or chills.  No sick contacts.  Denies any urinary symptoms.  No blood in the stool.  No known Covid exposures. Past Medical History:  Diagnosis Date  . Anemia   . Anxiety   . Cervical spondylosis without myelopathy 05/29/2015  . COLONIC POLYPS, HX OF 02/07/2008  . DEGENERATIVE JOINT DISEASE 10/14/2006   03/31/11: Pt states arthritis in her hip.  . Depression   . DIABETES MELLITUS, TYPE II 10/27/2009  . GERD 10/14/2006  . HYPERLIPIDEMIA 10/14/2006  . HYPERTENSION 10/14/2006   off bp meds now  . HYPOTHYROIDISM 10/14/2006  . LOW BACK PAIN 08/04/2007  . Obesity     Patient Active Problem List   Diagnosis Date Noted  . Non-compliant behavior 12/20/2018  . Need for influenza vaccination 12/20/2018  . Post-nasal drip 07/24/2018  . Anxiety 07/04/2018  . Uncontrolled type 2 diabetes mellitus with hyperglycemia ([place]) 06/06/2018  . Chronic fatigue 01/30/2018  . Dyslipidemia 10/17/2017  . Acute eczematoid otitis externa of left ear 07/26/2017  . Fungal dermatitis 07/26/2017  . Cervical spondylosis without myelopathy 05/29/2015  . Cervicogenic headache 05/29/2015  . Obesity 01/14/2015  . OA (osteoarthritis) of knee 09/23/2014  . Precordial chest pain 08/08/2014  . Anemia 07/02/2014  . Depression 06/08/2012  . KNEE PAIN, RIGHT 07/15/2008  . COLONIC POLYPS, HX OF 02/07/2008  . LOW BACK PAIN 08/04/2007  . Hypothyroidism 10/14/2006  . Essential hypertension 10/14/2006  . GERD 10/14/2006  . Osteoarthritis 10/14/2006    Past  Surgical History:  Procedure Laterality Date  . HEMORRHOID SURGERY    . Left knee arthroscopic  April 2014   Dr. Durward Fortes  . PARTIAL KNEE ARTHROPLASTY Left 09/23/2014   Procedure: LEFT UNICOMPARTMENTAL KNEE;  Surgeon: [name], MD;  Location: [facility] ORS;  Service: Orthopedics;  Laterality: Left;  . TONSILLECTOMY    . TUBAL LIGATION       OB History   No obstetric history on file.      Home Medications    Prior to Admission medications   Medication Sig Start Date End Date Taking? Authorizing Provider  busPIRone (BUSPAR) 15 MG tablet Take 1 tablet (15 mg total) by mouth daily. Patient taking differently: Take 15 mg by mouth daily as needed (depression).  07/04/18  Yes [name], MD  fluticasone (FLONASE) 50 MCG/ACT nasal spray SHAKE LIQUID AND USE 2 SPRAYS IN EACH NOSTRIL DAILY Patient taking differently: Place 2 sprays into both nostrils daily.  10/19/18  Yes [name], MD  ibuprofen (ADVIL,MOTRIN) 200 MG tablet Take 400 mg by mouth as needed for headache or moderate pain.    Yes [provider]  levothyroxine (SYNTHROID) 175 MCG tablet Take 1 tablet (175 mcg total) by mouth daily before breakfast. 10/16/18  Yes [name], MD  lisinopril (ZESTRIL) 10 MG tablet Take 1 tablet (10 mg total) by mouth daily. 12/19/18  Yes [name], MD  loratadine (CLARITIN) 10 MG tablet Take 10 mg by mouth daily as  needed for allergies or rhinitis.    Yes [provider]  mesalamine (CANASA) 1000 MG suppository Place 1,000 mg rectally as needed (diarrhea).  12/28/17  Yes [provider]  metFORMIN (GLUCOPHAGE) 1000 MG tablet Take 1 tablet (1,000 mg total) by mouth 2 (two) times daily with a meal. 07/04/18  Yes [name], MD  phentermine 37.5 MG capsule Take 1 capsule (37.5 mg total) by mouth as needed. 10/26/18  Yes [name], MD  propranolol (INDERAL) 20 MG tablet Take 1 tablet (20 mg total) by mouth 2 (two)  times daily. Patient taking differently: Take 20 mg by mouth daily as needed (hypertension).  11/21/18  Yes [name], MD  Blood Glucose Monitoring Suppl (ONE TOUCH ULTRA 2) w/Device KIT Use to test blood sugar 1-2 times daily. 10/16/18   [name], MD  glimepiride (AMARYL) 1 MG tablet Take 1 tablet (1 mg total) by mouth daily with breakfast. Patient not taking: Reported on 01/05/2019 06/20/18   Renato Shin, MD  glucose blood (ONETOUCH ULTRA) test strip Use to test blood sugars 1-2 times daily. 10/16/18   [name], MD  Lancets [facility] ULTRASOFT) lancets Use to test blood sugars 1-2 times daily. 10/16/18   [name], MD    Family History Family History  Problem Relation Age of Onset  . Heart attack Father 28  . Diabetes Father   . Kidney failure Mother   . Cancer Sister        endometrial  . Cancer Brother        Adrenal    Social History Social History   Tobacco Use  . Smoking status: Never Smoker  . Smokeless tobacco: Never Used  . Tobacco comment: Never Used Tobacco  Substance Use Topics  . Alcohol use: No    Alcohol/week: 0.0 standard drinks  . Drug use: No     Allergies   Niacin and related, Lactose intolerance (gi), Amoxicillin, Lactase, and Penicillins   Review of Systems Review of Systems  Constitutional: Positive for fatigue. Negative for chills and fever.  HENT: Negative for sore throat and trouble swallowing.   Respiratory: Negative for cough and shortness of breath.   Cardiovascular: Negative for chest pain.  Gastrointestinal: Positive for abdominal pain and diarrhea. Negative for constipation, nausea and vomiting.  Genitourinary: Negative for dysuria, flank pain and frequency.  Musculoskeletal: Negative for back pain and myalgias.  Skin: Negative for rash and wound.  Neurological: Negative for dizziness, weakness, light-headedness, numbness and headaches.  All other systems reviewed and are negative.     Physical Exam Updated Vital Signs BP 140/81   Pulse (!) 110   Temp 97.8 F (36.6 C)   Resp 18   Ht 5' 3"  (1.6 m)   Wt 74.8 kg   SpO2 97%   BMI 29.21 kg/m   Physical Exam Vitals signs and nursing note reviewed.  Constitutional:      General: She is not in acute distress.    Appearance: Normal appearance. She is well-developed. She is not ill-appearing.  HENT:     Head: Normocephalic and atraumatic.     Nose: Nose normal.  Eyes:     Pupils: Pupils are equal, round, and reactive to light.  Neck:     Musculoskeletal: Normal range of motion and neck supple.  Cardiovascular:     Rate and Rhythm: Regular rhythm. Tachycardia present.     Heart sounds: No murmur. No gallop.   Pulmonary:  Effort: Pulmonary effort is normal. No respiratory distress.     Breath sounds: Normal breath sounds. No stridor. No wheezing, rhonchi or rales.  Chest:     Chest wall: No tenderness.  Abdominal:     General: Bowel sounds are normal.     Palpations: Abdomen is soft.     Tenderness: There is abdominal tenderness. There is no guarding or rebound.     Comments: Right lower quadrant tenderness to palpation with guarding.  No definite rebound tenderness.  Musculoskeletal: Normal range of motion.        General: No swelling, tenderness, deformity or signs of injury.     Right lower leg: No edema.     Left lower leg: No edema.  Skin:    General: Skin is warm and dry.     Capillary Refill: Capillary refill takes less than 2 seconds.     Findings: No erythema or rash.  Neurological:     General: No focal deficit present.     Mental Status: She is alert and oriented to person, place, and time.  Psychiatric:        Behavior: Behavior normal.      ED Treatments / Results  Labs (all labs ordered are listed, but only abnormal results are displayed) Labs Reviewed  BASIC METABOLIC PANEL - Abnormal; Notable for the following components:      Result Value   Sodium 133 (*)    Potassium 5.3 (*)     Glucose, Bld 578 (*)    BUN 26 (*)    Calcium 8.5 (*)    GFR calc non Af Amer 58 (*)    All other components within normal limits  CBC - Abnormal; Notable for the following components:   WBC 15.0 (*)    RBC 5.16 (*)    MCH 25.2 (*)    RDW 16.6 (*)    Platelets 407 (*)    All other components within normal limits  URINALYSIS, ROUTINE W REFLEX MICROSCOPIC - Abnormal; Notable for the following components:   Specific Gravity, Urine 1.032 (*)    Glucose, UA >=500 (*)    Ketones, ur 20 (*)    All other components within normal limits  CBG MONITORING, ED - Abnormal; Notable for the following components:   Glucose-Capillary 534 (*)    All other components within normal limits  C DIFFICILE QUICK SCREEN W PCR REFLEX  SARS CORONAVIRUS 2 (HOSPITAL ORDER, [facility])  HEPATIC FUNCTION PANEL  LIPASE, BLOOD    EKG EKG Interpretation  Date/Time:  Friday January 05 2019 19:30:23 EDT Ventricular Rate:  110 PR Interval:    QRS Duration: 90 QT Interval:  371 QTC Calculation: 415 R Axis:   -58 Text Interpretation:  Sinus tachycardia Multiple premature complexes, vent & supraven Left anterior fascicular block ST elevation, consider inferior injury Confirmed by Julianne Rice [phone]) on 01/05/2019 9:00:29 PM   Radiology No results found.  Procedures Procedures (including critical care time)  Medications Ordered in ED Medications  sodium chloride (PF) 0.9 % injection (has no administration in time range)  insulin aspart (novoLOG) injection 10 Units (10 Units Intravenous Given 01/05/19 2102)  sodium chloride 0.9 % bolus 1,000 mL (1,000 mLs Intravenous New Bag/Given 01/05/19 2103)  iohexol (OMNIPAQUE) 300 MG/ML solution 100 mL (100 mLs Intravenous Contrast Given 01/05/19 2124)     Initial Impression / Assessment and Plan / ED Course  I have reviewed the triage vital signs and the nursing notes.  Pertinent labs & imaging results that were available during  my care of the patient were reviewed by me and considered in my medical decision making (see chart for details).        Patient with diarrheal illness and abdominal pain.  Hyperglycemia and tachycardia.  Likely dehydrated.  Will initiate IV fluids.  No recent antibiotics or hospitalization.  Awaiting CT scan of the abdomen and pelvis.  Hospitalist will see patient in the emergency department and admit. Final Clinical Impressions(s) / ED Diagnoses   Final diagnoses:  Colitis  Dehydration  Hyperglycemia    ED Discharge Orders    None       Julianne Rice, MD 01/05/19 2155

## 2019-01-06 ENCOUNTER — Observation Stay (HOSPITAL_COMMUNITY): Payer: Medicare Other

## 2019-01-06 ENCOUNTER — Encounter (HOSPITAL_COMMUNITY): Payer: Self-pay

## 2019-01-06 DIAGNOSIS — K51911 Ulcerative colitis, unspecified with rectal bleeding: Secondary | ICD-10-CM | POA: Diagnosis present

## 2019-01-06 DIAGNOSIS — E860 Dehydration: Secondary | ICD-10-CM | POA: Diagnosis present

## 2019-01-06 DIAGNOSIS — D649 Anemia, unspecified: Secondary | ICD-10-CM | POA: Diagnosis present

## 2019-01-06 DIAGNOSIS — Z888 Allergy status to other drugs, medicaments and biological substances status: Secondary | ICD-10-CM | POA: Diagnosis not present

## 2019-01-06 DIAGNOSIS — R0602 Shortness of breath: Secondary | ICD-10-CM | POA: Diagnosis not present

## 2019-01-06 DIAGNOSIS — R7989 Other specified abnormal findings of blood chemistry: Secondary | ICD-10-CM | POA: Diagnosis not present

## 2019-01-06 DIAGNOSIS — K648 Other hemorrhoids: Secondary | ICD-10-CM | POA: Diagnosis present

## 2019-01-06 DIAGNOSIS — E785 Hyperlipidemia, unspecified: Secondary | ICD-10-CM | POA: Diagnosis present

## 2019-01-06 DIAGNOSIS — Z86010 Personal history of colonic polyps: Secondary | ICD-10-CM | POA: Diagnosis not present

## 2019-01-06 DIAGNOSIS — I248 Other forms of acute ischemic heart disease: Secondary | ICD-10-CM | POA: Diagnosis present

## 2019-01-06 DIAGNOSIS — Z7401 Bed confinement status: Secondary | ICD-10-CM | POA: Diagnosis not present

## 2019-01-06 DIAGNOSIS — K219 Gastro-esophageal reflux disease without esophagitis: Secondary | ICD-10-CM

## 2019-01-06 DIAGNOSIS — Z96652 Presence of left artificial knee joint: Secondary | ICD-10-CM | POA: Diagnosis present

## 2019-01-06 DIAGNOSIS — I10 Essential (primary) hypertension: Secondary | ICD-10-CM

## 2019-01-06 DIAGNOSIS — K5190 Ulcerative colitis, unspecified, without complications: Secondary | ICD-10-CM | POA: Diagnosis not present

## 2019-01-06 DIAGNOSIS — R000 Tachycardia, unspecified: Secondary | ICD-10-CM | POA: Diagnosis not present

## 2019-01-06 DIAGNOSIS — E739 Lactose intolerance, unspecified: Secondary | ICD-10-CM | POA: Diagnosis present

## 2019-01-06 DIAGNOSIS — M2550 Pain in unspecified joint: Secondary | ICD-10-CM | POA: Diagnosis not present

## 2019-01-06 DIAGNOSIS — Z791 Long term (current) use of non-steroidal anti-inflammatories (NSAID): Secondary | ICD-10-CM | POA: Diagnosis not present

## 2019-01-06 DIAGNOSIS — Z880 Allergy status to penicillin: Secondary | ICD-10-CM | POA: Diagnosis not present

## 2019-01-06 DIAGNOSIS — K529 Noninfective gastroenteritis and colitis, unspecified: Secondary | ICD-10-CM | POA: Diagnosis not present

## 2019-01-06 DIAGNOSIS — Y92239 Unspecified place in hospital as the place of occurrence of the external cause: Secondary | ICD-10-CM | POA: Diagnosis not present

## 2019-01-06 DIAGNOSIS — Z841 Family history of disorders of kidney and ureter: Secondary | ICD-10-CM | POA: Diagnosis not present

## 2019-01-06 DIAGNOSIS — Z79899 Other long term (current) drug therapy: Secondary | ICD-10-CM | POA: Diagnosis not present

## 2019-01-06 DIAGNOSIS — Z833 Family history of diabetes mellitus: Secondary | ICD-10-CM | POA: Diagnosis not present

## 2019-01-06 DIAGNOSIS — D500 Iron deficiency anemia secondary to blood loss (chronic): Secondary | ICD-10-CM

## 2019-01-06 DIAGNOSIS — I471 Supraventricular tachycardia: Secondary | ICD-10-CM | POA: Diagnosis present

## 2019-01-06 DIAGNOSIS — R197 Diarrhea, unspecified: Secondary | ICD-10-CM | POA: Diagnosis not present

## 2019-01-06 DIAGNOSIS — E039 Hypothyroidism, unspecified: Secondary | ICD-10-CM | POA: Diagnosis present

## 2019-01-06 DIAGNOSIS — F329 Major depressive disorder, single episode, unspecified: Secondary | ICD-10-CM | POA: Diagnosis present

## 2019-01-06 DIAGNOSIS — T380X5A Adverse effect of glucocorticoids and synthetic analogues, initial encounter: Secondary | ICD-10-CM | POA: Diagnosis present

## 2019-01-06 DIAGNOSIS — Z20828 Contact with and (suspected) exposure to other viral communicable diseases: Secondary | ICD-10-CM | POA: Diagnosis present

## 2019-01-06 DIAGNOSIS — Z8249 Family history of ischemic heart disease and other diseases of the circulatory system: Secondary | ICD-10-CM | POA: Diagnosis not present

## 2019-01-06 DIAGNOSIS — Z7984 Long term (current) use of oral hypoglycemic drugs: Secondary | ICD-10-CM | POA: Diagnosis not present

## 2019-01-06 DIAGNOSIS — R5381 Other malaise: Secondary | ICD-10-CM | POA: Diagnosis not present

## 2019-01-06 DIAGNOSIS — R778 Other specified abnormalities of plasma proteins: Secondary | ICD-10-CM | POA: Diagnosis present

## 2019-01-06 DIAGNOSIS — E1165 Type 2 diabetes mellitus with hyperglycemia: Secondary | ICD-10-CM | POA: Diagnosis present

## 2019-01-06 LAB — GLUCOSE, CAPILLARY
Glucose-Capillary: 170 mg/dL — ABNORMAL HIGH (ref 70–99)
Glucose-Capillary: 180 mg/dL — ABNORMAL HIGH (ref 70–99)
Glucose-Capillary: 228 mg/dL — ABNORMAL HIGH (ref 70–99)
Glucose-Capillary: 270 mg/dL — ABNORMAL HIGH (ref 70–99)
Glucose-Capillary: 310 mg/dL — ABNORMAL HIGH (ref 70–99)
Glucose-Capillary: 371 mg/dL — ABNORMAL HIGH (ref 70–99)
Glucose-Capillary: 378 mg/dL — ABNORMAL HIGH (ref 70–99)

## 2019-01-06 LAB — C DIFFICILE QUICK SCREEN W PCR REFLEX
C Diff antigen: NEGATIVE
C Diff interpretation: NOT DETECTED
C Diff toxin: NEGATIVE

## 2019-01-06 LAB — D-DIMER, QUANTITATIVE: D-Dimer, Quant: 1.32 ug/mL-FEU — ABNORMAL HIGH (ref 0.00–0.50)

## 2019-01-06 LAB — TSH: TSH: 0.789 u[IU]/mL (ref 0.350–4.500)

## 2019-01-06 LAB — TROPONIN I (HIGH SENSITIVITY): Troponin I (High Sensitivity): 221 ng/L (ref <18)

## 2019-01-06 MED ORDER — METOPROLOL TARTRATE 50 MG PO TABS: 50.0000 mg | ORAL_TABLET | Freq: Three times a day (TID) | ORAL | Status: DC

## 2019-01-06 MED ORDER — AMIODARONE HCL 200 MG PO TABS
400.0000 mg | ORAL_TABLET | Freq: Two times a day (BID) | ORAL | Status: DC
  Administered 2019-01-06 (×2): 400 mg via ORAL
  Filled 2019-01-06 (×2): qty 2

## 2019-01-06 MED ORDER — PROPRANOLOL HCL 20 MG PO TABS
20.0000 mg | ORAL_TABLET | Freq: Two times a day (BID) | ORAL | Status: DC
  Administered 2019-01-06: 20 mg via ORAL
  Filled 2019-01-06: qty 1

## 2019-01-06 MED ORDER — IOHEXOL 350 MG/ML SOLN
100.0000 mL | Freq: Once | INTRAVENOUS | Status: AC | PRN
  Administered 2019-01-06: 100 mL via INTRAVENOUS

## 2019-01-06 MED ORDER — METOPROLOL TARTRATE 5 MG/5ML IV SOLN
5.0000 mg | Freq: Once | INTRAVENOUS | Status: AC
  Administered 2019-01-06: 5 mg via INTRAVENOUS
  Filled 2019-01-06: qty 5

## 2019-01-06 MED ORDER — MESALAMINE 1000 MG RE SUPP
1000.0000 mg | RECTAL | Status: DC | PRN
  Filled 2019-01-06: qty 1

## 2019-01-06 MED ORDER — LEVOTHYROXINE SODIUM 50 MCG PO TABS
175.0000 ug | ORAL_TABLET | Freq: Every day | ORAL | Status: DC
  Administered 2019-01-06 – 2019-01-14 (×9): 175 ug via ORAL
  Filled 2019-01-06 (×9): qty 1

## 2019-01-06 MED ORDER — METRONIDAZOLE IN NACL 5-0.79 MG/ML-% IV SOLN
500.0000 mg | Freq: Three times a day (TID) | INTRAVENOUS | Status: DC
  Administered 2019-01-06 – 2019-01-12 (×19): 500 mg via INTRAVENOUS
  Filled 2019-01-06 (×19): qty 100

## 2019-01-06 MED ORDER — SODIUM CHLORIDE 0.9 % IV BOLUS
500.0000 mL | Freq: Once | INTRAVENOUS | Status: AC
  Administered 2019-01-06: 500 mL via INTRAVENOUS

## 2019-01-06 MED ORDER — LISINOPRIL 10 MG PO TABS
10.0000 mg | ORAL_TABLET | Freq: Every day | ORAL | Status: DC
  Administered 2019-01-06 – 2019-01-14 (×9): 10 mg via ORAL
  Filled 2019-01-06 (×9): qty 1

## 2019-01-06 MED ORDER — FLUTICASONE PROPIONATE 50 MCG/ACT NA SUSP
2.0000 | Freq: Every day | NASAL | Status: DC
  Administered 2019-01-06 – 2019-01-14 (×7): 2 via NASAL
  Filled 2019-01-06: qty 16

## 2019-01-06 MED ORDER — METOPROLOL TARTRATE 25 MG PO TABS
25.0000 mg | ORAL_TABLET | Freq: Four times a day (QID) | ORAL | Status: DC
  Administered 2019-01-06 – 2019-01-14 (×31): 25 mg via ORAL
  Filled 2019-01-06 (×31): qty 1

## 2019-01-06 MED ORDER — BUSPIRONE HCL 5 MG PO TABS
15.0000 mg | ORAL_TABLET | Freq: Every day | ORAL | Status: DC
  Administered 2019-01-06 – 2019-01-14 (×9): 15 mg via ORAL
  Filled 2019-01-06 (×9): qty 1

## 2019-01-06 MED ORDER — LORATADINE 10 MG PO TABS
10.0000 mg | ORAL_TABLET | Freq: Every day | ORAL | Status: DC | PRN
  Administered 2019-01-09 – 2019-01-10 (×2): 10 mg via ORAL
  Filled 2019-01-06 (×2): qty 1

## 2019-01-06 MED ORDER — CIPROFLOXACIN IN D5W 400 MG/200ML IV SOLN
400.0000 mg | Freq: Two times a day (BID) | INTRAVENOUS | Status: DC
  Administered 2019-01-06 – 2019-01-12 (×13): 400 mg via INTRAVENOUS
  Filled 2019-01-06 (×11): qty 200

## 2019-01-06 NOTE — Progress Notes (Signed)
PROGRESS NOTE    Leslie Benton  [id number] DOB: Apr 06, 1941 DOA: 01/05/2019 PCP: [name], MD   Brief Narrative:  Leslie Benton  is a 78 y.o. female, w anxiety/depression, chronic lower back pain, hypertension, hyperlipidemia, Dm2, Hypothyroidism, hemorrhage of the anus ?, apparently presents with c/o diarrhea for the past week. Liquid stool about 5x per day. Sometimes blood.   Pt denies fever, chills, cp, palp, sob, n/v, abd pain, black stool.      Assessment & Plan:   Principal Problem:   Diarrhea Active Problems:   Hypothyroidism   Essential hypertension   GERD   Anemia   Tachycardia   Diarrhea, ? Colitis GI pathogen panel pending C. Diff neg Check celiac panel pending Cipro iv, Flagyl iv pharmacy to dose  Blood in stool  ? Chronic issue, seen in past by Buccini Monitor hgb, GI if drops  Tachycardia Tele Trop I elevated-appreciate cards input D dimer elev, neg CTA chest Check cardiac echo - pending  Hypertension Cont Lisinopril 10mg  po qday Cont Propranolol 20mg  po bid  Dm2 FSBS q4h, ISS Hold Amaryl STOP Metformin secondary to diarrhea Start Levemir 10 units [place] qday  Hypothyroidism Cont Levothyroxine175 micrograms po qday  DVT prophylaxis: SCD/Compression stockings  Code Status: FULL Code Status History    Date Active Date Inactive Code Status Order ID Comments User Context   09/23/2014 1229 09/25/2014 1653 Full Code [id number]  [name], MD Inpatient   07/02/2014 2040 07/04/2014 2122 Full Code [id number]  [name], MD ED   09/28/2013 1821 09/30/2013 1718 Full Code [id number]  [name], MD Inpatient   Advance Care Planning Activity     Family Communication: DISCUSSED IN DETAIL WITH PT  Disposition Plan:   Patient remained inpatient for continued treatment with IV antibiotics, slowly advance diet, fluid resuscitation, and continued subspecialty evaluation with cardiology for elevated troponin. Consults called:  CARDIOLOGY Admission status: Inpatient   Consultants:   CARDS  Procedures:  Ct Angio Chest Pe W Or Wo Contrast  Result Date: 01/06/2019 CLINICAL DATA:  Shortness of breath. Positive D-dimer. Patient presented with abdominal pain, diarrhea and weakness and underwent abdomen and pelvis CT scan last night, which showed findings of colitis. EXAM: CT ANGIOGRAPHY CHEST WITH CONTRAST TECHNIQUE: Multidetector CT imaging of the chest was performed using the standard protocol during bolus administration of intravenous contrast. Multiplanar CT image reconstructions and MIPs were obtained to evaluate the vascular anatomy. CONTRAST:  171mL OMNIPAQUE IOHEXOL 350 MG/ML SOLN COMPARISON:  Recent abdomen and pelvis CT. Chest radiograph dated 09/29/2013. FINDINGS: Cardiovascular: There is satisfactory opacification of the pulmonary arteries to the segmental level. There is no evidence of a pulmonary embolism. Heart is normal in size. Minor left coronary artery calcifications. No pericardial effusion. Great vessels are normal caliber. Aortic atherosclerosis. No dissection. Mediastinum/Nodes: Moderate-sized hiatal hernia. No neck base, mediastinal or hilar masses or enlarged lymph nodes. Trachea is unremarkable. Lungs/Pleura: No evidence of pneumonia or pulmonary edema. 5 mm ground-glass nodule, right upper lobe, image 35, series 6. 2-3 mm nodule, peripheral left lower lobe, image 69, series 6. No other nodules. Minor dependent lower lobe atelectasis, greater on the right. No pleural effusion or pneumothorax. Upper Abdomen: Partly imaged left transverse colon mild adjacent hazy inflammation consistent colitis as noted on the current abdomen and pelvis CT. Musculoskeletal: No fracture or acute finding. No osteoblastic or osteolytic lesions. Review of the MIP images confirms the above findings. IMPRESSION: 1. No evidence of a pulmonary embolism. 2. No acute findings.  3. Two small lung nodules, largest a 5 mm right upper lobe  ground-glass nodule. No follow-up needed if patient is low-risk (and has no known or suspected primary neoplasm). Non-contrast chest CT can be considered in 12 months if patient is high-risk. This recommendation follows the consensus statement: Guidelines for Management of Incidental Pulmonary Nodules Detected on CT Images: From the Fleischner Society 2017; Radiology 2017; 284:228-243. 4. Moderate hiatal hernia. Aortic Atherosclerosis (ICD10-I70.0). Electronically Signed   By: Lajean Manes M.D.   On: 01/06/2019 05:51   Ct Abdomen Pelvis W Contrast  Result Date: 01/05/2019 CLINICAL DATA:  Abdominal pain, diarrhea, weakness EXAM: CT ABDOMEN AND PELVIS WITH CONTRAST TECHNIQUE: Multidetector CT imaging of the abdomen and pelvis was performed using the standard protocol following bolus administration of intravenous contrast. CONTRAST:  132mL OMNIPAQUE IOHEXOL 300 MG/ML  SOLN COMPARISON:  None. FINDINGS: Lower chest: No acute abnormality.  Moderate hiatal hernia. Hepatobiliary: No solid liver abnormality is seen. No gallstones, gallbladder wall thickening, or biliary dilatation. Pancreas: Unremarkable. No pancreatic ductal dilatation or surrounding inflammatory changes. Spleen: Normal in size without significant abnormality. Adrenals/Urinary Tract: Adrenal glands are unremarkable. Kidneys are normal, without renal calculi, solid lesion, or hydronephrosis. Bladder is unremarkable. Stomach/Bowel: Stomach is within normal limits. Appendix is not clearly visualized and may be surgically absent. There is diffuse colonic wall thickening and vascular combing most conspicuous involving the transverse colon to the rectum. Fluid-filled distal colon and rectum. Vascular/Lymphatic: Aortic atherosclerosis. No enlarged abdominal or pelvic lymph nodes. Reproductive: No mass or other significant abnormality. Other: Small, fat containing umbilical hernia. No abdominopelvic ascites. Musculoskeletal: No acute or significant osseous  findings. IMPRESSION: 1. There is diffuse colonic wall thickening and vascular combing most conspicuous involving the transverse colon to the rectum. Fluid-filled distal colon and rectum. Findings are consistent with nonspecific infectious, inflammatory, or ischemic colitis. 2.  Hiatal hernia. 3.  Aortic Atherosclerosis (ICD10-I70.0). Electronically Signed   By: Eddie Candle M.D.   On: 01/05/2019 21:56     Antimicrobials:   CIPRO AND FLAGYL    Subjective: Patient made overnight with colitis Still with abdominal pain Elevated troponin noted cardiology consulted  Objective: Vitals:   01/06/19 0235 01/06/19 0449 01/06/19 0752 01/06/19 1205  BP: (!) 111/53 102/61 (!) 115/96 138/75  Pulse: (!) 144 (!) 133 69 (!) 114  Resp:  20 18 20   Temp:  (!) 97.3 F (36.3 C) (!) 97.5 F (36.4 C) 97.7 F (36.5 C)  TempSrc:  Oral  Oral  SpO2: 95% 97% 96% 97%  Weight:      Height:        Intake/Output Summary (Last 24 hours) at 01/06/2019 1330 Last data filed at 01/06/2019 0651 Gross per 24 hour  Intake 394.77 ml  Output --  Net 394.77 ml   Filed Weights   01/05/19 1856  Weight: 74.8 kg    Examination:  General exam: Appears calm and comfortable  Respiratory system: Clear to auscultation. Respiratory effort normal. Cardiovascular system: S1 & S2 heard, RRR. No JVD, murmurs, rubs, gallops or clicks. No pedal edema. Gastrointestinal system: Abdomen is nondistended, soft and mildly tender. No organomegaly or masses felt. Normal bowel sounds heard. Central nervous system: Alert and oriented. No focal neurological deficits. Extremities: WWP, no contractures Skin: No rashes, lesions or ulcers Psychiatry: Judgement and insight appear normal. Mood & affect appropriate.     Data Reviewed: I have personally reviewed following labs and imaging studies  CBC: Recent Labs  Lab 01/05/19 1939  WBC 15.0*  HGB 13.0  HCT 43.0  MCV 83.3  PLT [id number]*   Basic Metabolic Panel: Recent Labs  Lab  01/05/19 1939  NA 133*  K 5.3*  CL 98  CO2 22  GLUCOSE 578*  BUN 26*  CREATININE 0.93  CALCIUM 8.5*   GFR: Estimated Creatinine Clearance: 47.5 mL/min (by C-G formula based on SCr of 0.93 mg/dL). Liver Function Tests: Recent Labs  Lab 01/05/19 1939  AST 9*  ALT 12  ALKPHOS 118  BILITOT 0.4  PROT 6.6  ALBUMIN 3.0*   Recent Labs  Lab 01/05/19 1939  LIPASE 27   No results for input(s): AMMONIA in the last 168 hours. Coagulation Profile: No results for input(s): INR, PROTIME in the last 168 hours. Cardiac Enzymes: No results for input(s): CKTOTAL, CKMB, CKMBINDEX, TROPONINI in the last 168 hours. BNP (last 3 results) No results for input(s): PROBNP in the last 8760 hours. HbA1C: No results for input(s): HGBA1C in the last 72 hours. CBG: Recent Labs  Lab 01/05/19 2344 01/06/19 0106 01/06/19 0445 01/06/19 0734 01/06/19 1124  GLUCAP 303* 310* 270* 180* 170*   Lipid Profile: No results for input(s): CHOL, HDL, LDLCALC, TRIG, CHOLHDL, LDLDIRECT in the last 72 hours. Thyroid Function Tests: Recent Labs    01/06/19 0138  TSH 0.789   Anemia Panel: No results for input(s): VITAMINB12, FOLATE, FERRITIN, TIBC, IRON, RETICCTPCT in the last 72 hours. Sepsis Labs: No results for input(s): PROCALCITON, LATICACIDVEN in the last 168 hours.  Recent Results (from the past 240 hour(s))  C difficile quick scan w PCR reflex     Status: None   Collection Time: 01/05/19  9:04 PM   Specimen: STOOL  Result Value Ref Range Status   C Diff antigen NEGATIVE NEGATIVE Final   C Diff toxin NEGATIVE NEGATIVE Final   C Diff interpretation No C. difficile detected.  Final    Comment: Performed at [facility], [place] [address], [place], [place] [address]  SARS Coronavirus 2 [facility] order, Performed in [facility] hospital lab) Nasopharyngeal Nasopharyngeal Swab     Status: None   Collection Time: 01/05/19  9:04 PM   Specimen: Nasopharyngeal Swab  Result Value Ref  Range Status   SARS Coronavirus 2 NEGATIVE NEGATIVE Final    Comment: (NOTE) If result is NEGATIVE SARS-CoV-2 target nucleic acids are NOT DETECTED. The SARS-CoV-2 RNA is generally detectable in upper and lower  respiratory specimens during the acute phase of infection. The lowest  concentration of SARS-CoV-2 viral copies this assay can detect is 250  copies / mL. A negative result does not preclude SARS-CoV-2 infection  and should not be used as the sole basis for treatment or other  patient management decisions.  A negative result may occur with  improper specimen collection / handling, submission of specimen other  than nasopharyngeal swab, presence of viral mutation(s) within the  areas targeted by this assay, and inadequate number of viral copies  (<250 copies / mL). A negative result must be combined with clinical  observations, patient history, and epidemiological information. If result is POSITIVE SARS-CoV-2 target nucleic acids are DETECTED. The SARS-CoV-2 RNA is generally detectable in upper and lower  respiratory specimens dur ing the acute phase of infection.  Positive  results are indicative of active infection with SARS-CoV-2.  Clinical  correlation with patient history and other diagnostic information is  necessary to determine patient infection status.  Positive results do  not rule out bacterial infection or co-infection with other viruses. If  result is PRESUMPTIVE POSTIVE SARS-CoV-2 nucleic acids MAY BE PRESENT.   A presumptive positive result was obtained on the submitted specimen  and confirmed on repeat testing.  While 2019 novel coronavirus  (SARS-CoV-2) nucleic acids may be present in the submitted sample  additional confirmatory testing may be necessary for epidemiological  and / or clinical management purposes  to differentiate between  SARS-CoV-2 and other Sarbecovirus currently known to infect humans.  If clinically indicated additional testing with an  alternate test  methodology [phone]) is advised. The SARS-CoV-2 RNA is generally  detectable in upper and lower respiratory sp ecimens during the acute  phase of infection. The expected result is Negative. Fact Sheet for Patients:  StrictlyIdeas.no Fact Sheet for Healthcare Providers: BankingDealers.co.za This test is not yet approved or cleared by the [place] FDA and has been authorized for detection and/or diagnosis of SARS-CoV-2 by FDA under an Emergency Use Authorization (EUA).  This EUA will remain in effect (meaning this test can be used) for the duration of the COVID-19 declaration under Section 564(b)(1) of the Act, 21 U.S.C. section 360bbb-3(b)(1), unless the authorization is terminated or revoked sooner. Performed at [facility], [place] [address], [place], [place] [address]          Radiology Studies: Ct Angio Chest Pe W Or Wo Contrast  Result Date: 01/06/2019 CLINICAL DATA:  Shortness of breath. Positive D-dimer. Patient presented with abdominal pain, diarrhea and weakness and underwent abdomen and pelvis CT scan last night, which showed findings of colitis. EXAM: CT ANGIOGRAPHY CHEST WITH CONTRAST TECHNIQUE: Multidetector CT imaging of the chest was performed using the standard protocol during bolus administration of intravenous contrast. Multiplanar CT image reconstructions and MIPs were obtained to evaluate the vascular anatomy. CONTRAST:  160mL OMNIPAQUE IOHEXOL 350 MG/ML SOLN COMPARISON:  Recent abdomen and pelvis CT. Chest radiograph dated 09/29/2013. FINDINGS: Cardiovascular: There is satisfactory opacification of the pulmonary arteries to the segmental level. There is no evidence of a pulmonary embolism. Heart is normal in size. Minor left coronary artery calcifications. No pericardial effusion. Great vessels are normal caliber. Aortic atherosclerosis. No dissection. Mediastinum/Nodes: Moderate-sized  hiatal hernia. No neck base, mediastinal or hilar masses or enlarged lymph nodes. Trachea is unremarkable. Lungs/Pleura: No evidence of pneumonia or pulmonary edema. 5 mm ground-glass nodule, right upper lobe, image 35, series 6. 2-3 mm nodule, peripheral left lower lobe, image 69, series 6. No other nodules. Minor dependent lower lobe atelectasis, greater on the right. No pleural effusion or pneumothorax. Upper Abdomen: Partly imaged left transverse colon mild adjacent hazy inflammation consistent colitis as noted on the current abdomen and pelvis CT. Musculoskeletal: No fracture or acute finding. No osteoblastic or osteolytic lesions. Review of the MIP images confirms the above findings. IMPRESSION: 1. No evidence of a pulmonary embolism. 2. No acute findings. 3. Two small lung nodules, largest a 5 mm right upper lobe ground-glass nodule. No follow-up needed if patient is low-risk (and has no known or suspected primary neoplasm). Non-contrast chest CT can be considered in 12 months if patient is high-risk. This recommendation follows the consensus statement: Guidelines for Management of Incidental Pulmonary Nodules Detected on CT Images: From the Fleischner Society 2017; Radiology 2017; 284:228-243. 4. Moderate hiatal hernia. Aortic Atherosclerosis (ICD10-I70.0). Electronically Signed   By: Lajean Manes M.D.   On: 01/06/2019 05:51   Ct Abdomen Pelvis W Contrast  Result Date: 01/05/2019 CLINICAL DATA:  Abdominal pain, diarrhea, weakness EXAM: CT ABDOMEN AND PELVIS WITH CONTRAST TECHNIQUE: Multidetector CT  imaging of the abdomen and pelvis was performed using the standard protocol following bolus administration of intravenous contrast. CONTRAST:  141mL OMNIPAQUE IOHEXOL 300 MG/ML  SOLN COMPARISON:  None. FINDINGS: Lower chest: No acute abnormality.  Moderate hiatal hernia. Hepatobiliary: No solid liver abnormality is seen. No gallstones, gallbladder wall thickening, or biliary dilatation. Pancreas:  Unremarkable. No pancreatic ductal dilatation or surrounding inflammatory changes. Spleen: Normal in size without significant abnormality. Adrenals/Urinary Tract: Adrenal glands are unremarkable. Kidneys are normal, without renal calculi, solid lesion, or hydronephrosis. Bladder is unremarkable. Stomach/Bowel: Stomach is within normal limits. Appendix is not clearly visualized and may be surgically absent. There is diffuse colonic wall thickening and vascular combing most conspicuous involving the transverse colon to the rectum. Fluid-filled distal colon and rectum. Vascular/Lymphatic: Aortic atherosclerosis. No enlarged abdominal or pelvic lymph nodes. Reproductive: No mass or other significant abnormality. Other: Small, fat containing umbilical hernia. No abdominopelvic ascites. Musculoskeletal: No acute or significant osseous findings. IMPRESSION: 1. There is diffuse colonic wall thickening and vascular combing most conspicuous involving the transverse colon to the rectum. Fluid-filled distal colon and rectum. Findings are consistent with nonspecific infectious, inflammatory, or ischemic colitis. 2.  Hiatal hernia. 3.  Aortic Atherosclerosis (ICD10-I70.0). Electronically Signed   By: Eddie Candle M.D.   On: 01/05/2019 21:56        Scheduled Meds:  amiodarone  400 mg Oral BID   busPIRone  15 mg Oral Daily   fluticasone  2 spray Each Nare Daily   insulin aspart  0-9 Units Subcutaneous Q4H   levothyroxine  175 mcg Oral QAC breakfast   lisinopril  10 mg Oral Daily   metoprolol tartrate  25 mg Oral QID   Continuous Infusions:  ciprofloxacin 400 mg (01/06/19 1227)   metronidazole 500 mg (01/06/19 0543)     LOS: 0 days    Time spent: 35 min    Nicolette Bang, MD [facility]  If 7PM-7AM, please contact night-coverage  01/06/2019, 1:30 PM

## 2019-01-06 NOTE — Consult Note (Signed)
Cardiology Consultation:   Patient ID: MACLYN COMES MRN: [id number]; DOB: [birth year]  Admit date: 01/05/2019 Date of Consult: 01/06/2019  Primary Care Provider: [name], MD Primary Cardiologist  Hochrein  (seen once in April 2016)    Patient Profile:   Leslie Benton is a 78 y.o. female with a hx of  HTN, HL who is being seen today for the evaluation of  Elevated troponin at the request of Dr Maudie Mercury  History of Present Illness:   Leslie Benton is a 78 yo with hx of HTN, HL, DM2 and GI bleeding who presented yesterday with diarrhea x 1 wk  Some bloody.   The pt was seen once in 2016 by J Hochrein for elevated troponins in setting of anemia  Trop 0.17  Hgb 7.7   Underwent echo and myovue   Both normal     The pt denies CP    Breathing has been OK   She was admitted with weakness, abdominal discomfort and diarrhea   Heart Pathway Score:     Past Medical History:  Diagnosis Date   Anemia    Anxiety    Cervical spondylosis without myelopathy 05/29/2015   COLONIC POLYPS, HX OF 02/07/2008   DEGENERATIVE JOINT DISEASE 10/14/2006   03/31/11: Pt states arthritis in her hip.   Depression    DIABETES MELLITUS, TYPE II 10/27/2009   GERD 10/14/2006   HYPERLIPIDEMIA 10/14/2006   HYPERTENSION 10/14/2006   off bp meds now   HYPOTHYROIDISM 10/14/2006   LOW BACK PAIN 08/04/2007   Obesity     Past Surgical History:  Procedure Laterality Date   HEMORRHOID SURGERY     Left knee arthroscopic  April 2014   Dr. Durward Fortes   PARTIAL KNEE ARTHROPLASTY Left 09/23/2014   Procedure: LEFT UNICOMPARTMENTAL KNEE;  Surgeon: [name], MD;  Location: [facility];  Service: Orthopedics;  Laterality: Left;   TONSILLECTOMY     TUBAL LIGATION         Inpatient Medications: Scheduled Meds:  busPIRone  15 mg Oral Daily   fluticasone  2 spray Each Nare Daily   insulin aspart  0-9 Units Subcutaneous Q4H   levothyroxine  175 mcg Oral QAC breakfast   lisinopril  10 mg Oral Daily    propranolol  20 mg Oral BID   sodium chloride (PF)       Continuous Infusions:  ciprofloxacin     metronidazole 500 mg (01/06/19 0543)   PRN Meds: loratadine, mesalamine  Allergies:    Allergies  Allergen Reactions   Niacin And Related Hives and Swelling    No anaphalaxis, but strong reactions.   Lactose Intolerance (Gi)     "bad taste in mouth"   Amoxicillin Swelling    hives   Lactase    Penicillins Swelling    Hives With all cillins    Social History:   Social History   Socioeconomic History   Marital status: Married    Spouse name: Not on file   Number of children: 2   Years of education: college   Highest education level: Not on file  Occupational History   Occupation: retired  Scientist, product/process development strain: Not on file   Food insecurity    Worry: Not on file    Inability: Not on Lexicographer needs    Medical: Not on file    Non-medical: Not on file  Tobacco Use   Smoking status: Never Smoker   Smokeless tobacco:  Never Used   Tobacco comment: Never Used Tobacco  Substance and Sexual Activity   Alcohol use: No    Alcohol/week: 0.0 standard drinks   Drug use: No   Sexual activity: Not on file  Lifestyle   Physical activity    Days per week: Not on file    Minutes per session: Not on file   Stress: Not on file  Relationships   Social connections    Talks on phone: Not on file    Gets together: Not on file    Attends religious service: Not on file    Active member of club or organization: Not on file    Attends meetings of clubs or organizations: Not on file    Relationship status: Not on file   Intimate partner violence    Fear of current or ex partner: Not on file    Emotionally abused: Not on file    Physically abused: Not on file    Forced sexual activity: Not on file  Other Topics Concern   Not on file  Social History Narrative   Lives with husband and son.     Patient drinks 1-2 cups  of caffeine daily.   Patient is left handed.     Family History:    Family History  Problem Relation Age of Onset   Heart attack Father 60   Diabetes Father    Kidney failure Mother    Cancer Sister        endometrial   Cancer Brother        Adrenal     ROS:  Please see the history of present illness.   All other ROS reviewed and negative.     Physical Exam/Data:   Vitals:   01/06/19 0120 01/06/19 0235 01/06/19 0449 01/06/19 0752  BP: (!) 98/58 (!) 111/53 102/61 (!) 115/96  Pulse: (!) 128 (!) 144 (!) 133 69  Resp:   20 18  Temp:   (!) 97.3 F (36.3 C) (!) 97.5 F (36.4 C)  TempSrc:   Oral   SpO2: 96% 95% 97% 96%  Weight:      Height:        Intake/Output Summary (Last 24 hours) at 01/06/2019 0821 Last data filed at 01/06/2019 0651 Gross per 24 hour  Intake 394.77 ml  Output --  Net 394.77 ml   Last 3 Weights 01/05/2019 12/19/2018 07/04/2018  Weight (lbs) 164 lb 14.5 oz 165 lb 171 lb  Weight (kg) 74.8 kg 74.844 kg 77.565 kg     Body mass index is 29.21 kg/m.  General:  Well nourished, well developed, in no acute distress HEENT: normal Lymph: no adenopathy Neck: no JVD Endocrine:  No thryomegaly Vascular: No carotid bruits; FA pulses 2+ bilaterally without bruits  Cardiac  Tachy   Irreg rate   Normal S1, S2  ; no murmu Lungs:  clear to auscultation bilaterally, no wheezing, rhonchi or rales  Abd: Deferred  Sl distended   Rag on abdomen   Ext: no edema Musculoskeletal:  No deformities, BUE and BLE strength normal and equal Skin: warm and dry  Neuro:  CNs 2-12 intact, no focal abnormalities noted Psych:  Normal affect   EKG:  The EKG was personally reviewed and demonstrates:  ST 110 bpm   PACs   Telemetry:  Telemetry was personally reviewed and demonstrates:  Sinus rhythm   Some SVT    80s to 130s    Occasional PVCs, couplets, triplets    Relevant  CV Studies:  Echo 07/04/14-------------------------------------------------------------------  Study  Conclusions   - Left ventricle: The cavity size was normal. There was moderate  focal basal hypertrophy of the septum. Systolic function was  normal. The estimated ejection fraction was in the range of 55%  to 60%. Wall motion was normal; there were no regional wall  motion abnormalities. Doppler parameters are consistent with  abnormal left ventricular relaxation (grade 1 diastolic  dysfunction). Doppler parameters are consistent with high  ventricular filling pressure.  - Mitral valve: There was mild regurgitation.  - Left atrium: The atrium was mildly dilated.   Impressions:   - Normal LV function; grade 1 diastolic dysfunction with elevated  filling pressure; mild LAE; mild MR.   Lexiscan Myovue 08/13/14  Low risk   No ischemia   TID 1.3, prossibly related to HTN  LVEF 62%  Laboratory Data:  High Sensitivity Troponin:   Recent Labs  Lab 01/06/19 0138  TROPONINIHS 221*     Chemistry Recent Labs  Lab 01/05/19 1939  NA 133*  K 5.3*  CL 98  CO2 22  GLUCOSE 578*  BUN 26*  CREATININE 0.93  CALCIUM 8.5*  GFRNONAA 58*  GFRAA >60  ANIONGAP 13    Recent Labs  Lab 01/05/19 1939  PROT 6.6  ALBUMIN 3.0*  AST 9*  ALT 12  ALKPHOS 118  BILITOT 0.4   Hematology Recent Labs  Lab 01/05/19 1939  WBC 15.0*  RBC 5.16*  HGB 13.0  HCT 43.0  MCV 83.3  MCH 25.2*  MCHC 30.2  RDW 16.6*  PLT 407*   BNPNo results for input(s): BNP, PROBNP in the last 168 hours.  DDimer  Recent Labs  Lab 01/06/19 0138  DDIMER 1.32*     Radiology/Studies:  Ct Angio Chest Pe W Or Wo Contrast  Result Date: 01/06/2019 CLINICAL DATA:  Shortness of breath. Positive D-dimer. Patient presented with abdominal pain, diarrhea and weakness and underwent abdomen and pelvis CT scan last night, which showed findings of colitis. EXAM: CT ANGIOGRAPHY CHEST WITH CONTRAST TECHNIQUE: Multidetector CT imaging of the chest was performed using the standard protocol during bolus  administration of intravenous contrast. Multiplanar CT image reconstructions and MIPs were obtained to evaluate the vascular anatomy. CONTRAST:  19mL OMNIPAQUE IOHEXOL 350 MG/ML SOLN COMPARISON:  Recent abdomen and pelvis CT. Chest radiograph dated 09/29/2013. FINDINGS: Cardiovascular: There is satisfactory opacification of the pulmonary arteries to the segmental level. There is no evidence of a pulmonary embolism. Heart is normal in size. Minor left coronary artery calcifications. No pericardial effusion. Great vessels are normal caliber. Aortic atherosclerosis. No dissection. Mediastinum/Nodes: Moderate-sized hiatal hernia. No neck base, mediastinal or hilar masses or enlarged lymph nodes. Trachea is unremarkable. Lungs/Pleura: No evidence of pneumonia or pulmonary edema. 5 mm ground-glass nodule, right upper lobe, image 35, series 6. 2-3 mm nodule, peripheral left lower lobe, image 69, series 6. No other nodules. Minor dependent lower lobe atelectasis, greater on the right. No pleural effusion or pneumothorax. Upper Abdomen: Partly imaged left transverse colon mild adjacent hazy inflammation consistent colitis as noted on the current abdomen and pelvis CT. Musculoskeletal: No fracture or acute finding. No osteoblastic or osteolytic lesions. Review of the MIP images confirms the above findings. IMPRESSION: 1. No evidence of a pulmonary embolism. 2. No acute findings. 3. Two small lung nodules, largest a 5 mm right upper lobe ground-glass nodule. No follow-up needed if patient is low-risk (and has no known or suspected primary neoplasm). Non-contrast chest CT can be considered  in 12 months if patient is high-risk. This recommendation follows the consensus statement: Guidelines for Management of Incidental Pulmonary Nodules Detected on CT Images: From the Fleischner Society 2017; Radiology 2017; 284:228-243. 4. Moderate hiatal hernia. Aortic Atherosclerosis (ICD10-I70.0). Electronically Signed   By: Lajean Manes  M.D.   On: 01/06/2019 05:51   Ct Abdomen Pelvis W Contrast  Result Date: 01/05/2019 CLINICAL DATA:  Abdominal pain, diarrhea, weakness EXAM: CT ABDOMEN AND PELVIS WITH CONTRAST TECHNIQUE: Multidetector CT imaging of the abdomen and pelvis was performed using the standard protocol following bolus administration of intravenous contrast. CONTRAST:  138mL OMNIPAQUE IOHEXOL 300 MG/ML  SOLN COMPARISON:  None. FINDINGS: Lower chest: No acute abnormality.  Moderate hiatal hernia. Hepatobiliary: No solid liver abnormality is seen. No gallstones, gallbladder wall thickening, or biliary dilatation. Pancreas: Unremarkable. No pancreatic ductal dilatation or surrounding inflammatory changes. Spleen: Normal in size without significant abnormality. Adrenals/Urinary Tract: Adrenal glands are unremarkable. Kidneys are normal, without renal calculi, solid lesion, or hydronephrosis. Bladder is unremarkable. Stomach/Bowel: Stomach is within normal limits. Appendix is not clearly visualized and may be surgically absent. There is diffuse colonic wall thickening and vascular combing most conspicuous involving the transverse colon to the rectum. Fluid-filled distal colon and rectum. Vascular/Lymphatic: Aortic atherosclerosis. No enlarged abdominal or pelvic lymph nodes. Reproductive: No mass or other significant abnormality. Other: Small, fat containing umbilical hernia. No abdominopelvic ascites. Musculoskeletal: No acute or significant osseous findings. IMPRESSION: 1. There is diffuse colonic wall thickening and vascular combing most conspicuous involving the transverse colon to the rectum. Fluid-filled distal colon and rectum. Findings are consistent with nonspecific infectious, inflammatory, or ischemic colitis. 2.  Hiatal hernia. 3.  Aortic Atherosclerosis (ICD10-I70.0). Electronically Signed   By: Eddie Candle M.D.   On: 01/05/2019 21:56    Assessment and Plan:   1  Elevated troponin.   Pt with very mild elevation of  troponin   I am not convinced this represents ischemia from CAD   May represent subendocardial strain / ischemia in setting of significant tachycardia    Recomm:  Control rate     Continue  to trend  Pt denies CP   2  Rhythm  Pt has been  in and out of ? Atrial tachycardia vs atrial flutter/fib (Tele is working on saving more strips) Onset abrupt  She has been mostly tachycardic   The pt does not sense her heart is racing  Not sure how long she has had     She is currently  on propranolol without much effect and her BP is marginal    For now  I would switch to metoprolol and also add amiodarone (oral since on floor) to see if can quiet down   Hold on anticoagulation for now until more data  Echo when heart slows    3  Atherosclerosis  Mild CAD on CT scan   Has atherosclerosis on addomincal aorta    For questions or updates, please contact [facility] Please consult [email or web address] for contact info under     Signed, Dorris Carnes, MD  01/06/2019 8:21 AM

## 2019-01-06 NOTE — Progress Notes (Signed)
Patient admitted to room 1431 from ED around 0100 with HR in the 140s. Patient in no acute distress. NP made aware of BP and HR, Order received for  NS 500 ML bolus. SVT with HR of 160 per telemetry, Critical troponin of 221 per Lab. NP made aware and new order given.

## 2019-01-06 NOTE — ED Notes (Signed)
ED TO INPATIENT HANDOFF REPORT  ED Nurse Name and Phone #: Barth Kirks, Rn  S Name/Age/Gender Leslie Benton 78 y.o. female Room/Bed: WA06/WA06  Code Status   Code Status: Prior  Home/SNF/Other given to floor Patient oriented to: self, place, time and situation Is this baseline? Yes   Triage Complete: Triage complete  Chief Complaint Hyperglycemia and Diarrhea  Triage Note Per EMS, Pt is coming from home. Pt called EMS due to Diarrhea for 1 wk without vomiting, and with increased weakness. On EMS arrival Pts CBG read Hi. Pt also complaining of abd pain.    Allergies Allergies  Allergen Reactions  . Niacin And Related Hives and Swelling    No anaphalaxis, but strong reactions.  . Lactose Intolerance (Gi)     "bad taste in mouth"  . Amoxicillin Swelling    hives  . Lactase   . Penicillins Swelling    Hives With all cillins    Level of Care/Admitting Diagnosis ED Disposition    ED Disposition Condition [facility] Area: [place] [[id number]]  Level of Care: Telemetry [5]  Admit to tele based on following criteria: Monitor for Ischemic changes  Covid Evaluation: Asymptomatic Screening Protocol (No Symptoms)  Diagnosis: Diarrhea [787.91.ICD-9-CM]  Admitting Physician: Jani Gravel [[id number]]  Attending Physician: Jani Gravel [[id number]]  PT Class (Do Not Modify): Observation [104]  PT Acc Code (Do Not Modify): Observation [10022]       B Medical/Surgery History Past Medical History:  Diagnosis Date  . Anemia   . Anxiety   . Cervical spondylosis without myelopathy 05/29/2015  . COLONIC POLYPS, HX OF 02/07/2008  . DEGENERATIVE JOINT DISEASE 10/14/2006   03/31/11: Pt states arthritis in her hip.  . Depression   . DIABETES MELLITUS, TYPE II 10/27/2009  . GERD 10/14/2006  . HYPERLIPIDEMIA 10/14/2006  . HYPERTENSION 10/14/2006   off bp meds now  . HYPOTHYROIDISM 10/14/2006  . LOW BACK PAIN 08/04/2007  . Obesity    Past Surgical History:   Procedure Laterality Date  . HEMORRHOID SURGERY    . Left knee arthroscopic  April 2014   Dr. Durward Fortes  . PARTIAL KNEE ARTHROPLASTY Left 09/23/2014   Procedure: LEFT UNICOMPARTMENTAL KNEE;  Surgeon: [name], MD;  Location: [facility];  Service: Orthopedics;  Laterality: Left;  . TONSILLECTOMY    . TUBAL LIGATION       A IV Location/Drains/Wounds Patient Lines/Drains/Airways Status   Active Line/Drains/Airways    Name:   Placement date:   Placement time:   Site:   Days:   Peripheral IV 09/28/13 Right Hand   09/28/13    -    Hand   1926   Peripheral IV 07/02/14 Right Antecubital   07/02/14    1831    Antecubital   1649   Peripheral IV 01/05/19 Anterior;Left;Proximal Forearm   01/05/19    1848    Forearm   1   Peripheral IV 01/05/19 Right Antecubital   01/05/19    1937    Antecubital   1   AIRWAYS   09/23/14    0925     1566   Incision (Closed) 09/23/14 Leg Left   09/23/14    1016     1566          Intake/Output Last 24 hours No intake or output data in the 24 hours ending 01/06/19 0024  Labs/Imaging Results for orders placed or performed during the hospital encounter of 01/05/19 (from the  past 48 hour(s))  CBG monitoring, ED     Status: Abnormal   Collection Time: 01/05/19  7:36 PM  Result Value Ref Range   Glucose-Capillary 534 (HH) 70 - 99 mg/dL  Basic metabolic panel     Status: Abnormal   Collection Time: 01/05/19  7:39 PM  Result Value Ref Range   Sodium 133 (L) 135 - 145 mmol/L   Potassium 5.3 (H) 3.5 - 5.1 mmol/L   Chloride 98 98 - 111 mmol/L   CO2 22 22 - 32 mmol/L   Glucose, Bld 578 (HH) 70 - 99 mg/dL    Comment: CRITICAL RESULT CALLED TO, READ BACK BY AND VERIFIED WITH: TEETER,Z RN @2042  ON 01/05/2019 JACKSON,K    BUN 26 (H) 8 - 23 mg/dL   Creatinine, Ser 0.93 0.44 - 1.00 mg/dL   Calcium 8.5 (L) 8.9 - 10.3 mg/dL   GFR calc non Af Amer 58 (L) >60 mL/min   GFR calc Af Amer >60 >60 mL/min   Anion gap 13 5 - 15    Comment: Performed at [facility], [place] [address], [place], [place] [address]  CBC     Status: Abnormal   Collection Time: 01/05/19  7:39 PM  Result Value Ref Range   WBC 15.0 (H) 4.0 - 10.5 K/uL   RBC 5.16 (H) 3.87 - 5.11 MIL/uL   Hemoglobin 13.0 12.0 - 15.0 g/dL   HCT 43.0 36.0 - 46.0 %   MCV 83.3 80.0 - 100.0 fL   MCH 25.2 (L) 26.0 - 34.0 pg   MCHC 30.2 30.0 - 36.0 g/dL   RDW 16.6 (H) 11.5 - 15.5 %   Platelets 407 (H) 150 - 400 K/uL   nRBC 0.0 0.0 - 0.2 %    Comment: Performed at [facility], [place] [address], [place], [place] [address]  Hepatic function panel     Status: Abnormal   Collection Time: 01/05/19  7:39 PM  Result Value Ref Range   Total Protein 6.6 6.5 - 8.1 g/dL   Albumin 3.0 (L) 3.5 - 5.0 g/dL   AST 9 (L) 15 - 41 U/L   ALT 12 0 - 44 U/L   Alkaline Phosphatase 118 38 - 126 U/L   Total Bilirubin 0.4 0.3 - 1.2 mg/dL   Bilirubin, Direct 0.1 0.0 - 0.2 mg/dL   Indirect Bilirubin 0.3 0.3 - 0.9 mg/dL    Comment: Performed at [facility], [place] [address], [place], [place] [address]  Lipase, blood     Status: None   Collection Time: 01/05/19  7:39 PM  Result Value Ref Range   Lipase 27 11 - 51 U/L    Comment: Performed at [facility], [place] [address] [place], [place] [address]  C difficile quick scan w PCR reflex     Status: None   Collection Time: 01/05/19  9:04 PM   Specimen: STOOL  Result Value Ref Range   C Diff antigen NEGATIVE NEGATIVE   C Diff toxin NEGATIVE NEGATIVE   C Diff interpretation No C. difficile detected.     Comment: Performed at [facility], [place] [address] [place], [place] [address]  SARS Coronavirus 2 [facility] order, Performed in [facility] hospital lab) Nasopharyngeal Nasopharyngeal Swab     Status: None   Collection Time: 01/05/19  9:04 PM   Specimen: Nasopharyngeal Swab  Result Value Ref Range   SARS Coronavirus 2 NEGATIVE NEGATIVE    Comment: (NOTE)  If result is NEGATIVE SARS-CoV-2  target nucleic acids are NOT DETECTED. The SARS-CoV-2 RNA is generally detectable in upper and lower  respiratory specimens during the acute phase of infection. The lowest  concentration of SARS-CoV-2 viral copies this assay can detect is 250  copies / mL. A negative result does not preclude SARS-CoV-2 infection  and should not be used as the sole basis for treatment or other  patient management decisions.  A negative result may occur with  improper specimen collection / handling, submission of specimen other  than nasopharyngeal swab, presence of viral mutation(s) within the  areas targeted by this assay, and inadequate number of viral copies  (<250 copies / mL). A negative result must be combined with clinical  observations, patient history, and epidemiological information. If result is POSITIVE SARS-CoV-2 target nucleic acids are DETECTED. The SARS-CoV-2 RNA is generally detectable in upper and lower  respiratory specimens dur ing the acute phase of infection.  Positive  results are indicative of active infection with SARS-CoV-2.  Clinical  correlation with patient history and other diagnostic information is  necessary to determine patient infection status.  Positive results do  not rule out bacterial infection or co-infection with other viruses. If result is PRESUMPTIVE POSTIVE SARS-CoV-2 nucleic acids MAY BE PRESENT.   A presumptive positive result was obtained on the submitted specimen  and confirmed on repeat testing.  While 2019 novel coronavirus  (SARS-CoV-2) nucleic acids may be present in the submitted sample  additional confirmatory testing may be necessary for epidemiological  and / or clinical management purposes  to differentiate between  SARS-CoV-2 and other Sarbecovirus currently known to infect humans.  If clinically indicated additional testing with an alternate test  methodology [id number]) is advised. The SARS-CoV-2 RNA is generally  detectable in upper and lower  respiratory sp ecimens during the acute  phase of infection. The expected result is Negative. Fact Sheet for Patients:  StrictlyIdeas.no Fact Sheet for Healthcare Providers: BankingDealers.co.za This test is not yet approved or cleared by the [place] FDA and has been authorized for detection and/or diagnosis of SARS-CoV-2 by FDA under an Emergency Use Authorization (EUA).  This EUA will remain in effect (meaning this test can be used) for the duration of the COVID-19 declaration under Section 564(b)(1) of the Act, 21 U.S.C. section 360bbb-3(b)(1), unless the authorization is terminated or revoked sooner. Performed at [facility], [place] [address], [place], [place] [address]   Urinalysis, Routine w reflex microscopic     Status: Abnormal   Collection Time: 01/05/19  9:05 PM  Result Value Ref Range   Color, Urine YELLOW YELLOW   APPearance CLEAR CLEAR   Specific Gravity, Urine 1.032 (H) 1.005 - 1.030   pH 5.0 5.0 - 8.0   Glucose, UA >=500 (A) NEGATIVE mg/dL   Hgb urine dipstick NEGATIVE NEGATIVE   Bilirubin Urine NEGATIVE NEGATIVE   Ketones, ur 20 (A) NEGATIVE mg/dL   Protein, ur NEGATIVE NEGATIVE mg/dL   Nitrite NEGATIVE NEGATIVE   Leukocytes,Ua NEGATIVE NEGATIVE   RBC / HPF 0-5 0 - 5 RBC/hpf   WBC, UA 0-5 0 - 5 WBC/hpf   Bacteria, UA NONE SEEN NONE SEEN   Mucus PRESENT     Comment: Performed at [facility], [place] [address], [place], [place] [address]  CBG monitoring, ED     Status: Abnormal   Collection Time: 01/05/19 11:44 PM  Result Value Ref Range   Glucose-Capillary 303 (H) 70 - 99 mg/dL  Ct Abdomen Pelvis W Contrast  Result Date: 01/05/2019 CLINICAL DATA:  Abdominal pain, diarrhea, weakness EXAM: CT ABDOMEN AND PELVIS WITH CONTRAST TECHNIQUE: Multidetector CT imaging of the abdomen and pelvis was performed using the standard protocol following bolus administration of intravenous  contrast. CONTRAST:  191mL OMNIPAQUE IOHEXOL 300 MG/ML  SOLN COMPARISON:  None. FINDINGS: Lower chest: No acute abnormality.  Moderate hiatal hernia. Hepatobiliary: No solid liver abnormality is seen. No gallstones, gallbladder wall thickening, or biliary dilatation. Pancreas: Unremarkable. No pancreatic ductal dilatation or surrounding inflammatory changes. Spleen: Normal in size without significant abnormality. Adrenals/Urinary Tract: Adrenal glands are unremarkable. Kidneys are normal, without renal calculi, solid lesion, or hydronephrosis. Bladder is unremarkable. Stomach/Bowel: Stomach is within normal limits. Appendix is not clearly visualized and may be surgically absent. There is diffuse colonic wall thickening and vascular combing most conspicuous involving the transverse colon to the rectum. Fluid-filled distal colon and rectum. Vascular/Lymphatic: Aortic atherosclerosis. No enlarged abdominal or pelvic lymph nodes. Reproductive: No mass or other significant abnormality. Other: Small, fat containing umbilical hernia. No abdominopelvic ascites. Musculoskeletal: No acute or significant osseous findings. IMPRESSION: 1. There is diffuse colonic wall thickening and vascular combing most conspicuous involving the transverse colon to the rectum. Fluid-filled distal colon and rectum. Findings are consistent with nonspecific infectious, inflammatory, or ischemic colitis. 2.  Hiatal hernia. 3.  Aortic Atherosclerosis (ICD10-I70.0). Electronically Signed   By: Eddie Candle M.D.   On: 01/05/2019 21:56    Pending Labs Unresulted Labs (From admission, onward)    Start     Ordered   01/06/19 0500  Gliadin antibodies, serum  (Celiac Panel (PNL))  Tomorrow morning,   R     01/06/19 0008   01/06/19 0500  Tissue transglutaminase, IgA  (Celiac Panel (PNL))  Tomorrow morning,   R     01/06/19 0008   01/06/19 0500  Reticulin Antibody, IgA w reflex titer  (Celiac Panel (PNL))  Tomorrow morning,   R     01/06/19 0008    01/05/19 2206  TSH  Once,   STAT     01/05/19 2205   01/05/19 2206  D-dimer, quantitative (not at [facility])  Add-on,   AD     01/05/19 2205   01/05/19 2200  GI pathogen panel by PCR, stool  (Gastrointestinal Panel by PCR, Stool                                                                                                                                                     *Does Not include CLOSTRIDIUM DIFFICILE testing.**If CDIFF testing is needed, select the C Difficile Quick Screen w PCR reflex order below)  Once,   STAT     01/05/19 2159          Vitals/Pain Today's Vitals   01/05/19 2000 01/05/19 2200 01/05/19 2300 01/05/19 2333  BP: 140/81 (!) 143/93 (!) 112/92 101/71  Pulse: (!) 110   (!) 140  Resp: 18 15 17 20   Temp:      SpO2: 97%  97% 97%  Weight:      Height:      PainSc:        Isolation Precautions Enteric precautions (UV disinfection)  Medications Medications  sodium chloride (PF) 0.9 % injection (has no administration in time range)  ciprofloxacin (CIPRO) IVPB 400 mg (400 mg Intravenous New Bag/Given 01/05/19 2331)  insulin aspart (novoLOG) injection 0-9 Units (has no administration in time range)  0.9 %  sodium chloride infusion (has no administration in time range)  metroNIDAZOLE (FLAGYL) IVPB 500 mg (has no administration in time range)  ciprofloxacin (CIPRO) IVPB 400 mg (has no administration in time range)  insulin aspart (novoLOG) injection 10 Units (10 Units Intravenous Given 01/05/19 2102)  sodium chloride 0.9 % bolus 1,000 mL (1,000 mLs Intravenous New Bag/Given 01/05/19 2103)  iohexol (OMNIPAQUE) 300 MG/ML solution 100 mL (100 mLs Intravenous Contrast Given 01/05/19 2124)  metroNIDAZOLE (FLAGYL) IVPB 500 mg (500 mg Intravenous New Bag/Given 01/05/19 2221)    Mobility non-ambulatory Low fall risk   Focused Assessments Cardiac Assessment Handoff:    Lab Results  Component Value Date   TROPONINI 0.17 (H) 07/03/2014   No results found for:  DDIMER Does the Patient currently have chest pain? No     R Recommendations: See Admitting Provider Note  Report given to:   Additional Notes:

## 2019-01-06 NOTE — Progress Notes (Signed)
CRITICAL VALUE ALERT  Critical Value:  Troponin 221  Date & Time Notied:  01/06/19@0244   Provider Notified: Yes  Orders Received/Actions taken: yes

## 2019-01-07 DIAGNOSIS — I471 Supraventricular tachycardia: Secondary | ICD-10-CM

## 2019-01-07 LAB — BASIC METABOLIC PANEL
Anion gap: 7 (ref 5–15)
BUN: 16 mg/dL (ref 8–23)
CO2: 19 mmol/L — ABNORMAL LOW (ref 22–32)
Calcium: 7.7 mg/dL — ABNORMAL LOW (ref 8.9–10.3)
Chloride: 111 mmol/L (ref 98–111)
Creatinine, Ser: 0.59 mg/dL (ref 0.44–1.00)
GFR calc Af Amer: >60 mL/min (ref >60)
GFR calc non Af Amer: >60 mL/min (ref >60)
Glucose, Bld: 91 mg/dL (ref 70–99)
Potassium: 3.5 mmol/L (ref 3.5–5.1)
Sodium: 137 mmol/L (ref 135–145)

## 2019-01-07 LAB — CBC WITH DIFFERENTIAL/PLATELET
Abs Immature Granulocytes: 0.82 10*3/uL — ABNORMAL HIGH (ref 0.00–0.07)
Basophils Absolute: 0 10*3/uL (ref 0.0–0.1)
Basophils Relative: 0 %
Eosinophils Absolute: 0.1 10*3/uL (ref 0.0–0.5)
Eosinophils Relative: 1 %
HCT: 38.1 % (ref 36.0–46.0)
Hemoglobin: 11.5 g/dL — ABNORMAL LOW (ref 12.0–15.0)
Immature Granulocytes: 8 %
Lymphocytes Relative: 19 %
Lymphs Abs: 1.9 10*3/uL (ref 0.7–4.0)
MCH: 25.2 pg — ABNORMAL LOW (ref 26.0–34.0)
MCHC: 30.2 g/dL (ref 30.0–36.0)
MCV: 83.6 fL (ref 80.0–100.0)
Monocytes Absolute: 0.7 10*3/uL (ref 0.1–1.0)
Monocytes Relative: 7 %
Neutro Abs: 6.7 10*3/uL (ref 1.7–7.7)
Neutrophils Relative %: 65 %
Platelets: 326 10*3/uL (ref 150–400)
RBC: 4.56 MIL/uL (ref 3.87–5.11)
RDW: 16.6 % — ABNORMAL HIGH (ref 11.5–15.5)
WBC: 10.3 10*3/uL (ref 4.0–10.5)
nRBC: 0 % (ref 0.0–0.2)

## 2019-01-07 LAB — GLUCOSE, CAPILLARY
Glucose-Capillary: 79 mg/dL (ref 70–99)
Glucose-Capillary: 138 mg/dL — ABNORMAL HIGH (ref 70–99)
Glucose-Capillary: 182 mg/dL — ABNORMAL HIGH (ref 70–99)
Glucose-Capillary: 274 mg/dL — ABNORMAL HIGH (ref 70–99)
Glucose-Capillary: 375 mg/dL — ABNORMAL HIGH (ref 70–99)

## 2019-01-07 MED ORDER — AMIODARONE HCL 200 MG PO TABS
200.0000 mg | ORAL_TABLET | Freq: Two times a day (BID) | ORAL | Status: DC
  Administered 2019-01-07 – 2019-01-09 (×5): 200 mg via ORAL
  Filled 2019-01-07 (×5): qty 1

## 2019-01-07 NOTE — Progress Notes (Signed)
Called Pennie Rushing, son in law, with updates. Informed him regarding medications, procedures and plan of care.

## 2019-01-07 NOTE — Progress Notes (Addendum)
Progress Note  Patient Name: LAVONNA VALERIE Date of Encounter: 01/07/2019  Primary Cardiologist: New  Subjective   Breathing OK  NO CP   " I just don't feel good,  I need help.  They did not turn me on my head"  Inpatient Medications    Scheduled Meds:  amiodarone  400 mg Oral BID   busPIRone  15 mg Oral Daily   fluticasone  2 spray Each Nare Daily   insulin aspart  0-9 Units Subcutaneous Q4H   levothyroxine  175 mcg Oral QAC breakfast   lisinopril  10 mg Oral Daily   metoprolol tartrate  25 mg Oral QID   Continuous Infusions:  ciprofloxacin 400 mg (01/06/19 2245)   metronidazole 500 mg (01/07/19 0511)   PRN Meds: loratadine, mesalamine   Vital Signs    Vitals:   01/06/19 2010 01/06/19 2247 01/07/19 0414 01/07/19 0529  BP: 131/63 111/75 106/64 101/76  Pulse: 70 (!) 105 84 75  Resp: 18  19 18   Temp: 98.2 F (36.8 C)  97.7 F (36.5 C) 98.7 F (37.1 C)  TempSrc: Oral  Oral Oral  SpO2: 98%  97% 99%  Weight:      Height:        Intake/Output Summary (Last 24 hours) at 01/07/2019 0800 Last data filed at 01/07/2019 0520 Gross per 24 hour  Intake 1905.53 ml  Output 100 ml  Net 1805.53 ml   Last 3 Weights 01/05/2019 12/19/2018 07/04/2018  Weight (lbs) 164 lb 14.5 oz 165 lb 171 lb  Weight (kg) 74.8 kg 74.844 kg 77.565 kg      Telemetry    SR with PACs  Frequent    - Personally Reviewed  ECG    None - Personally Reviewed  Physical Exam   GEN: Pt laying in bed  Uncomfortable but in NAD  Neck: No JVD Cardiac: RRR, no murmurs, rubs, or gallops.  Respiratory: Clear to auscultation bilaterally. GI: Soft, Mild diffuse tenderness  No rebound   MS: No edema; No deformity. Neuro:  Nonfocal Psych:  Pt uncomfortable     Labs    High Sensitivity Troponin:   Recent Labs  Lab 01/06/19 0138  TROPONINIHS 221*      Chemistry Recent Labs  Lab 01/05/19 1939 01/07/19 0402  NA 133* 137  K 5.3* 3.5  CL 98 111  CO2 22 19*  GLUCOSE 578* 91  BUN 26* 16   CREATININE 0.93 0.59  CALCIUM 8.5* 7.7*  PROT 6.6  --   ALBUMIN 3.0*  --   AST 9*  --   ALT 12  --   ALKPHOS 118  --   BILITOT 0.4  --   GFRNONAA 58* >60  GFRAA >60 >60  ANIONGAP 13 7     Hematology Recent Labs  Lab 01/05/19 1939 01/07/19 0402  WBC 15.0* 10.3  RBC 5.16* 4.56  HGB 13.0 11.5*  HCT 43.0 38.1  MCV 83.3 83.6  MCH 25.2* 25.2*  MCHC 30.2 30.2  RDW 16.6* 16.6*  PLT 407* 326    BNPNo results for input(s): BNP, PROBNP in the last 168 hours.   DDimer  Recent Labs  Lab 01/06/19 0138  DDIMER 1.32*     Radiology    Ct Angio Chest Pe W Or Wo Contrast  Result Date: 01/06/2019 CLINICAL DATA:  Shortness of breath. Positive D-dimer. Patient presented with abdominal pain, diarrhea and weakness and underwent abdomen and pelvis CT scan last night, which showed findings of colitis.  EXAM: CT ANGIOGRAPHY CHEST WITH CONTRAST TECHNIQUE: Multidetector CT imaging of the chest was performed using the standard protocol during bolus administration of intravenous contrast. Multiplanar CT image reconstructions and MIPs were obtained to evaluate the vascular anatomy. CONTRAST:  176mL OMNIPAQUE IOHEXOL 350 MG/ML SOLN COMPARISON:  Recent abdomen and pelvis CT. Chest radiograph dated 09/29/2013. FINDINGS: Cardiovascular: There is satisfactory opacification of the pulmonary arteries to the segmental level. There is no evidence of a pulmonary embolism. Heart is normal in size. Minor left coronary artery calcifications. No pericardial effusion. Great vessels are normal caliber. Aortic atherosclerosis. No dissection. Mediastinum/Nodes: Moderate-sized hiatal hernia. No neck base, mediastinal or hilar masses or enlarged lymph nodes. Trachea is unremarkable. Lungs/Pleura: No evidence of pneumonia or pulmonary edema. 5 mm ground-glass nodule, right upper lobe, image 35, series 6. 2-3 mm nodule, peripheral left lower lobe, image 69, series 6. No other nodules. Minor dependent lower lobe atelectasis,  greater on the right. No pleural effusion or pneumothorax. Upper Abdomen: Partly imaged left transverse colon mild adjacent hazy inflammation consistent colitis as noted on the current abdomen and pelvis CT. Musculoskeletal: No fracture or acute finding. No osteoblastic or osteolytic lesions. Review of the MIP images confirms the above findings. IMPRESSION: 1. No evidence of a pulmonary embolism. 2. No acute findings. 3. Two small lung nodules, largest a 5 mm right upper lobe ground-glass nodule. No follow-up needed if patient is low-risk (and has no known or suspected primary neoplasm). Non-contrast chest CT can be considered in 12 months if patient is high-risk. This recommendation follows the consensus statement: Guidelines for Management of Incidental Pulmonary Nodules Detected on CT Images: From the Fleischner Society 2017; Radiology 2017; 284:228-243. 4. Moderate hiatal hernia. Aortic Atherosclerosis (ICD10-I70.0). Electronically Signed   By: Lajean Manes M.D.   On: 01/06/2019 05:51   Ct Abdomen Pelvis W Contrast  Result Date: 01/05/2019 CLINICAL DATA:  Abdominal pain, diarrhea, weakness EXAM: CT ABDOMEN AND PELVIS WITH CONTRAST TECHNIQUE: Multidetector CT imaging of the abdomen and pelvis was performed using the standard protocol following bolus administration of intravenous contrast. CONTRAST:  130mL OMNIPAQUE IOHEXOL 300 MG/ML  SOLN COMPARISON:  None. FINDINGS: Lower chest: No acute abnormality.  Moderate hiatal hernia. Hepatobiliary: No solid liver abnormality is seen. No gallstones, gallbladder wall thickening, or biliary dilatation. Pancreas: Unremarkable. No pancreatic ductal dilatation or surrounding inflammatory changes. Spleen: Normal in size without significant abnormality. Adrenals/Urinary Tract: Adrenal glands are unremarkable. Kidneys are normal, without renal calculi, solid lesion, or hydronephrosis. Bladder is unremarkable. Stomach/Bowel: Stomach is within normal limits. Appendix is not  clearly visualized and may be surgically absent. There is diffuse colonic wall thickening and vascular combing most conspicuous involving the transverse colon to the rectum. Fluid-filled distal colon and rectum. Vascular/Lymphatic: Aortic atherosclerosis. No enlarged abdominal or pelvic lymph nodes. Reproductive: No mass or other significant abnormality. Other: Small, fat containing umbilical hernia. No abdominopelvic ascites. Musculoskeletal: No acute or significant osseous findings. IMPRESSION: 1. There is diffuse colonic wall thickening and vascular combing most conspicuous involving the transverse colon to the rectum. Fluid-filled distal colon and rectum. Findings are consistent with nonspecific infectious, inflammatory, or ischemic colitis. 2.  Hiatal hernia. 3.  Aortic Atherosclerosis (ICD10-I70.0). Electronically Signed   By: Eddie Candle M.D.   On: 01/05/2019 21:56    Cardiac Studies   None this admit    Echo 07/04/14-------------------------------------------------------------------  Study Conclusions   - Left ventricle: The cavity size was normal. There was moderate  focal basal hypertrophy of the septum. Systolic function was  normal. The estimated ejection fraction was in the range of 55%  to 60%. Wall motion was normal; there were no regional wall  motion abnormalities. Doppler parameters are consistent with  abnormal left ventricular relaxation (grade 1 diastolic  dysfunction). Doppler parameters are consistent with high  ventricular filling pressure.  - Mitral valve: There was mild regurgitation.  - Left atrium: The atrium was mildly dilated.   Impressions:   - Normal LV function; grade 1 diastolic dysfunction with elevated  filling pressure; mild LAE; mild MR.   Lexiscan Myovue 08/13/14  Low risk   No ischemia   TID 1.3, prossibly related to HTN  LVEF 62%   Patient Profile     78 y.o. female with hx of HTN, HL admitted with diarrhea.  Found to be  anemic, tachycardic.  Asked to see regarding HS trop of 221.    Assessment & Plan    1  Rhythm   Pt with frequent runs of SVT yesterday   Probable atrial tachycardia, I do not think atrial fibrillation    I placed her on amiodarone 400 bid po (BP was not great to push b blocker) and this has improved some   Still with ectopy but HR is not sustained in the 120s-140 as it was yesterday   For now I would continue po amiodarone to stabilize rhythm   Decrease to 200 bid   Keep on current b blocker   Pt does not have sensation of palpitations   May be having more of this at home and she is unaware.  Reasonable to continue amiodarone at low dose and arrange outpt f/u with 3 day holter at some pt.  2  Elevated troponin.  Most like due to demand/strain in setting of significant tachycardia, GI illness.   Pt without CP No evid of CHF on exam  3  GI  Treating for colitis  4  Psych   PT appears uncomfortable, a little confused   NOthing focal on exam.  Discussed with nursing       For questions or updates, please contact [place] Please consult [email or web address] for contact info under        Signed, Dorris Carnes, MD  01/07/2019, 8:00 AM

## 2019-01-07 NOTE — Progress Notes (Signed)
PROGRESS NOTE    Leslie Benton  [id number] DOB: 1940-10-08 DOA: 01/05/2019 PCP: [name], MD   Brief Narrative:  [age] y.o.female,w anxiety/depression, chronic lower back pain, hypertension, hyperlipidemia, Dm2, Hypothyroidism, hemorrhage of the anus ?, apparently presents with c/o diarrhea for the past week. Liquid stool about 5x per day. Sometimes blood. Pt denies fever, chills, cp, palp, sob, n/v, abd pain, black stool.   Assessment & Plan:   Principal Problem:   Diarrhea Active Problems:   Hypothyroidism   Essential hypertension   GERD   Anemia   Tachycardia   Elevated troponin   SVT (supraventricular tachycardia) (HCC)   Diarrhea, ? Colitis GI pathogen panel still pending C. Diff neg Check celiac panel pending Cipro iv, Flagyl iv pharmacy to dose  Blood in stool ? ? Chronic issue, none noted in EMR, seen in past by Buccini Monitor hgb, GI if drops and pos stool ordered hemoccult-black stool?  Tachycardia with elev trop Tele Trop I elevated-appreciate cards input D dimer elev, neg CTA chest Check cardiac echo - pending C/w amio  Hypertension Cont Lisinopril 10mg  po qday Cont Propranolol 20mg  po bid  Dm2 FSBS q4h, ISS Hold Amaryl STOP Metformin secondary to diarrhea Start Levemir 10 units [place] qday  Hypothyroidism Cont Levothyroxine175 micrograms po qday  DVT prophylaxis: SCD/Compression stockings  Code Status: Full Code Status History    Date Active Date Inactive Code Status Order ID Comments User Context   09/23/2014 1229 09/25/2014 1653 Full Code [id number]  [name], MD Inpatient   07/02/2014 2040 07/04/2014 2122 Full Code [id number]  [name], MD ED   09/28/2013 1821 09/30/2013 1718 Full Code [id number]  [name], MD Inpatient   Advance Care Planning Activity     Family Communication: Juline Patch spouse at [id number] no answer Disposition Plan:   Patient remained inpatient for continued  treatment of colitis, with IV antibiotics, fluid resuscitation as well as subspecialty evaluation and adjustment of medications for atrial fibrillation requiring antiarrhythmics. Consults called: None Admission status: Inpatient   Consultants:   CARDS  Procedures:  Ct Angio Chest Pe W Or Wo Contrast  Result Date: 01/06/2019 CLINICAL DATA:  Shortness of breath. Positive D-dimer. Patient presented with abdominal pain, diarrhea and weakness and underwent abdomen and pelvis CT scan last night, which showed findings of colitis. EXAM: CT ANGIOGRAPHY CHEST WITH CONTRAST TECHNIQUE: Multidetector CT imaging of the chest was performed using the standard protocol during bolus administration of intravenous contrast. Multiplanar CT image reconstructions and MIPs were obtained to evaluate the vascular anatomy. CONTRAST:  153mL OMNIPAQUE IOHEXOL 350 MG/ML SOLN COMPARISON:  Recent abdomen and pelvis CT. Chest radiograph dated 09/29/2013. FINDINGS: Cardiovascular: There is satisfactory opacification of the pulmonary arteries to the segmental level. There is no evidence of a pulmonary embolism. Heart is normal in size. Minor left coronary artery calcifications. No pericardial effusion. Great vessels are normal caliber. Aortic atherosclerosis. No dissection. Mediastinum/Nodes: Moderate-sized hiatal hernia. No neck base, mediastinal or hilar masses or enlarged lymph nodes. Trachea is unremarkable. Lungs/Pleura: No evidence of pneumonia or pulmonary edema. 5 mm ground-glass nodule, right upper lobe, image 35, series 6. 2-3 mm nodule, peripheral left lower lobe, image 69, series 6. No other nodules. Minor dependent lower lobe atelectasis, greater on the right. No pleural effusion or pneumothorax. Upper Abdomen: Partly imaged left transverse colon mild adjacent hazy inflammation consistent colitis as noted on the current abdomen and pelvis CT. Musculoskeletal: No fracture or acute finding. No osteoblastic or osteolytic  lesions.  Review of the MIP images confirms the above findings. IMPRESSION: 1. No evidence of a pulmonary embolism. 2. No acute findings. 3. Two small lung nodules, largest a 5 mm right upper lobe ground-glass nodule. No follow-up needed if patient is low-risk (and has no known or suspected primary neoplasm). Non-contrast chest CT can be considered in 12 months if patient is high-risk. This recommendation follows the consensus statement: Guidelines for Management of Incidental Pulmonary Nodules Detected on CT Images: From the Fleischner Society 2017; Radiology 2017; 284:228-243. 4. Moderate hiatal hernia. Aortic Atherosclerosis (ICD10-I70.0). Electronically Signed   By: Lajean Manes M.D.   On: 01/06/2019 05:51   Ct Abdomen Pelvis W Contrast  Result Date: 01/05/2019 CLINICAL DATA:  Abdominal pain, diarrhea, weakness EXAM: CT ABDOMEN AND PELVIS WITH CONTRAST TECHNIQUE: Multidetector CT imaging of the abdomen and pelvis was performed using the standard protocol following bolus administration of intravenous contrast. CONTRAST:  188mL OMNIPAQUE IOHEXOL 300 MG/ML  SOLN COMPARISON:  None. FINDINGS: Lower chest: No acute abnormality.  Moderate hiatal hernia. Hepatobiliary: No solid liver abnormality is seen. No gallstones, gallbladder wall thickening, or biliary dilatation. Pancreas: Unremarkable. No pancreatic ductal dilatation or surrounding inflammatory changes. Spleen: Normal in size without significant abnormality. Adrenals/Urinary Tract: Adrenal glands are unremarkable. Kidneys are normal, without renal calculi, solid lesion, or hydronephrosis. Bladder is unremarkable. Stomach/Bowel: Stomach is within normal limits. Appendix is not clearly visualized and may be surgically absent. There is diffuse colonic wall thickening and vascular combing most conspicuous involving the transverse colon to the rectum. Fluid-filled distal colon and rectum. Vascular/Lymphatic: Aortic atherosclerosis. No enlarged abdominal or pelvic  lymph nodes. Reproductive: No mass or other significant abnormality. Other: Small, fat containing umbilical hernia. No abdominopelvic ascites. Musculoskeletal: No acute or significant osseous findings. IMPRESSION: 1. There is diffuse colonic wall thickening and vascular combing most conspicuous involving the transverse colon to the rectum. Fluid-filled distal colon and rectum. Findings are consistent with nonspecific infectious, inflammatory, or ischemic colitis. 2.  Hiatal hernia. 3.  Aortic Atherosclerosis (ICD10-I70.0). Electronically Signed   By: Eddie Candle M.D.   On: 01/05/2019 21:56     Antimicrobials:   CIPRO FLAGYL   Subjective: Patient seems somewhat confused Reports being "turned on her head" Reported pain but was asleep upon entering the room  Objective: Vitals:   01/06/19 2247 01/07/19 0414 01/07/19 0529 01/07/19 1226  BP: 111/75 106/64 101/76 115/66  Pulse: (!) 105 84 75 (!) 110  Resp:  19 18 16   Temp:  97.7 F (36.5 C) 98.7 F (37.1 C) 97.6 F (36.4 C)  TempSrc:  Oral Oral   SpO2:  97% 99% 95%  Weight:      Height:        Intake/Output Summary (Last 24 hours) at 01/07/2019 1252 Last data filed at 01/07/2019 0520 Gross per 24 hour  Intake 1905.53 ml  Output 100 ml  Net 1805.53 ml   Filed Weights   01/05/19 1856  Weight: 74.8 kg    Examination:  General exam: Appears MOD CONFUSED Respiratory system: Clear to auscultation. Respiratory effort normal. Cardiovascular system: rrr, no m/r/g Gastrointestinal system: Abdomen is nondistended, soft and  Mildly tender. No organomegaly or masses felt. Normal bowel sounds heard. Central nervous system: Alert and oriented. No focal neurological deficits. Extremities: wwp, no edema Skin: No rashes, lesions or ulcers Psychiatry: Judgement and insight impaired Mood & affect impaired 2/2 confusion    Data Reviewed: I have personally reviewed following labs and imaging studies  CBC: Recent Labs  Lab  01/05/19 1939  01/07/19 0402  WBC 15.0* 10.3  NEUTROABS  --  6.7  HGB 13.0 11.5*  HCT 43.0 38.1  MCV 83.3 83.6  PLT 407* [id number]   Basic Metabolic Panel: Recent Labs  Lab 01/05/19 1939 01/07/19 0402  NA 133* 137  K 5.3* 3.5  CL 98 111  CO2 22 19*  GLUCOSE 578* 91  BUN 26* 16  CREATININE 0.93 0.59  CALCIUM 8.5* 7.7*   GFR: Estimated Creatinine Clearance: 55.3 mL/min (by C-G formula based on SCr of 0.59 mg/dL). Liver Function Tests: Recent Labs  Lab 01/05/19 1939  AST 9*  ALT 12  ALKPHOS 118  BILITOT 0.4  PROT 6.6  ALBUMIN 3.0*   Recent Labs  Lab 01/05/19 1939  LIPASE 27   No results for input(s): AMMONIA in the last 168 hours. Coagulation Profile: No results for input(s): INR, PROTIME in the last 168 hours. Cardiac Enzymes: No results for input(s): CKTOTAL, CKMB, CKMBINDEX, TROPONINI in the last 168 hours. BNP (last 3 results) No results for input(s): PROBNP in the last 8760 hours. HbA1C: No results for input(s): HGBA1C in the last 72 hours. CBG: Recent Labs  Lab 01/06/19 2056 01/06/19 2350 01/07/19 0412 01/07/19 0845 01/07/19 1238  GLUCAP 378* 228* 79 138* 182*   Lipid Profile: No results for input(s): CHOL, HDL, LDLCALC, TRIG, CHOLHDL, LDLDIRECT in the last 72 hours. Thyroid Function Tests: Recent Labs    01/06/19 0138  TSH 0.789   Anemia Panel: No results for input(s): VITAMINB12, FOLATE, FERRITIN, TIBC, IRON, RETICCTPCT in the last 72 hours. Sepsis Labs: No results for input(s): PROCALCITON, LATICACIDVEN in the last 168 hours.  Recent Results (from the past 240 hour(s))  C difficile quick scan w PCR reflex     Status: None   Collection Time: 01/05/19  9:04 PM   Specimen: STOOL  Result Value Ref Range Status   C Diff antigen NEGATIVE NEGATIVE Final   C Diff toxin NEGATIVE NEGATIVE Final   C Diff interpretation No C. difficile detected.  Final    Comment: Performed at [facility], [place] [address], [place], [place] [address]  SARS  Coronavirus 2 [facility] order, Performed in [facility] hospital lab) Nasopharyngeal Nasopharyngeal Swab     Status: None   Collection Time: 01/05/19  9:04 PM   Specimen: Nasopharyngeal Swab  Result Value Ref Range Status   SARS Coronavirus 2 NEGATIVE NEGATIVE Final    Comment: (NOTE) If result is NEGATIVE SARS-CoV-2 target nucleic acids are NOT DETECTED. The SARS-CoV-2 RNA is generally detectable in upper and lower  respiratory specimens during the acute phase of infection. The lowest  concentration of SARS-CoV-2 viral copies this assay can detect is 250  copies / mL. A negative result does not preclude SARS-CoV-2 infection  and should not be used as the sole basis for treatment or other  patient management decisions.  A negative result may occur with  improper specimen collection / handling, submission of specimen other  than nasopharyngeal swab, presence of viral mutation(s) within the  areas targeted by this assay, and inadequate number of viral copies  (<250 copies / mL). A negative result must be combined with clinical  observations, patient history, and epidemiological information. If result is POSITIVE SARS-CoV-2 target nucleic acids are DETECTED. The SARS-CoV-2 RNA is generally detectable in upper and lower  respiratory specimens dur ing the acute phase of infection.  Positive  results are indicative of active infection with SARS-CoV-2.  Clinical  correlation with patient  history and other diagnostic information is  necessary to determine patient infection status.  Positive results do  not rule out bacterial infection or co-infection with other viruses. If result is PRESUMPTIVE POSTIVE SARS-CoV-2 nucleic acids MAY BE PRESENT.   A presumptive positive result was obtained on the submitted specimen  and confirmed on repeat testing.  While 2019 novel coronavirus  (SARS-CoV-2) nucleic acids may be present in the submitted sample  additional confirmatory testing may be necessary  for epidemiological  and / or clinical management purposes  to differentiate between  SARS-CoV-2 and other Sarbecovirus currently known to infect humans.  If clinically indicated additional testing with an alternate test  methodology [phone]) is advised. The SARS-CoV-2 RNA is generally  detectable in upper and lower respiratory sp ecimens during the acute  phase of infection. The expected result is Negative. Fact Sheet for Patients:  StrictlyIdeas.no Fact Sheet for Healthcare Providers: BankingDealers.co.za This test is not yet approved or cleared by the [place] FDA and has been authorized for detection and/or diagnosis of SARS-CoV-2 by FDA under an Emergency Use Authorization (EUA).  This EUA will remain in effect (meaning this test can be used) for the duration of the COVID-19 declaration under Section 564(b)(1) of the Act, 21 U.S.C. section 360bbb-3(b)(1), unless the authorization is terminated or revoked sooner. Performed at [facility], [place] [address], [place], [place] [address]          Radiology Studies: Ct Angio Chest Pe W Or Wo Contrast  Result Date: 01/06/2019 CLINICAL DATA:  Shortness of breath. Positive D-dimer. Patient presented with abdominal pain, diarrhea and weakness and underwent abdomen and pelvis CT scan last night, which showed findings of colitis. EXAM: CT ANGIOGRAPHY CHEST WITH CONTRAST TECHNIQUE: Multidetector CT imaging of the chest was performed using the standard protocol during bolus administration of intravenous contrast. Multiplanar CT image reconstructions and MIPs were obtained to evaluate the vascular anatomy. CONTRAST:  180mL OMNIPAQUE IOHEXOL 350 MG/ML SOLN COMPARISON:  Recent abdomen and pelvis CT. Chest radiograph dated 09/29/2013. FINDINGS: Cardiovascular: There is satisfactory opacification of the pulmonary arteries to the segmental level. There is no evidence of a pulmonary  embolism. Heart is normal in size. Minor left coronary artery calcifications. No pericardial effusion. Great vessels are normal caliber. Aortic atherosclerosis. No dissection. Mediastinum/Nodes: Moderate-sized hiatal hernia. No neck base, mediastinal or hilar masses or enlarged lymph nodes. Trachea is unremarkable. Lungs/Pleura: No evidence of pneumonia or pulmonary edema. 5 mm ground-glass nodule, right upper lobe, image 35, series 6. 2-3 mm nodule, peripheral left lower lobe, image 69, series 6. No other nodules. Minor dependent lower lobe atelectasis, greater on the right. No pleural effusion or pneumothorax. Upper Abdomen: Partly imaged left transverse colon mild adjacent hazy inflammation consistent colitis as noted on the current abdomen and pelvis CT. Musculoskeletal: No fracture or acute finding. No osteoblastic or osteolytic lesions. Review of the MIP images confirms the above findings. IMPRESSION: 1. No evidence of a pulmonary embolism. 2. No acute findings. 3. Two small lung nodules, largest a 5 mm right upper lobe ground-glass nodule. No follow-up needed if patient is low-risk (and has no known or suspected primary neoplasm). Non-contrast chest CT can be considered in 12 months if patient is high-risk. This recommendation follows the consensus statement: Guidelines for Management of Incidental Pulmonary Nodules Detected on CT Images: From the Fleischner Society 2017; Radiology 2017; 284:228-243. 4. Moderate hiatal hernia. Aortic Atherosclerosis (ICD10-I70.0). Electronically Signed   By: Lajean Manes M.D.   On: 01/06/2019  05:51   Ct Abdomen Pelvis W Contrast  Result Date: 01/05/2019 CLINICAL DATA:  Abdominal pain, diarrhea, weakness EXAM: CT ABDOMEN AND PELVIS WITH CONTRAST TECHNIQUE: Multidetector CT imaging of the abdomen and pelvis was performed using the standard protocol following bolus administration of intravenous contrast. CONTRAST:  171mL OMNIPAQUE IOHEXOL 300 MG/ML  SOLN COMPARISON:  None.  FINDINGS: Lower chest: No acute abnormality.  Moderate hiatal hernia. Hepatobiliary: No solid liver abnormality is seen. No gallstones, gallbladder wall thickening, or biliary dilatation. Pancreas: Unremarkable. No pancreatic ductal dilatation or surrounding inflammatory changes. Spleen: Normal in size without significant abnormality. Adrenals/Urinary Tract: Adrenal glands are unremarkable. Kidneys are normal, without renal calculi, solid lesion, or hydronephrosis. Bladder is unremarkable. Stomach/Bowel: Stomach is within normal limits. Appendix is not clearly visualized and may be surgically absent. There is diffuse colonic wall thickening and vascular combing most conspicuous involving the transverse colon to the rectum. Fluid-filled distal colon and rectum. Vascular/Lymphatic: Aortic atherosclerosis. No enlarged abdominal or pelvic lymph nodes. Reproductive: No mass or other significant abnormality. Other: Small, fat containing umbilical hernia. No abdominopelvic ascites. Musculoskeletal: No acute or significant osseous findings. IMPRESSION: 1. There is diffuse colonic wall thickening and vascular combing most conspicuous involving the transverse colon to the rectum. Fluid-filled distal colon and rectum. Findings are consistent with nonspecific infectious, inflammatory, or ischemic colitis. 2.  Hiatal hernia. 3.  Aortic Atherosclerosis (ICD10-I70.0). Electronically Signed   By: Eddie Candle M.D.   On: 01/05/2019 21:56        Scheduled Meds:  amiodarone  200 mg Oral BID   busPIRone  15 mg Oral Daily   fluticasone  2 spray Each Nare Daily   insulin aspart  0-9 Units Subcutaneous Q4H   levothyroxine  175 mcg Oral QAC breakfast   lisinopril  10 mg Oral Daily   metoprolol tartrate  25 mg Oral QID   Continuous Infusions:  ciprofloxacin 400 mg (01/07/19 1229)   metronidazole 500 mg (01/07/19 0511)     LOS: 1 day    Time spent: 35 min    Nicolette Bang, MD [facility]  If 7PM-7AM, please contact night-coverage  01/07/2019, 12:52 PM

## 2019-01-08 DIAGNOSIS — R000 Tachycardia, unspecified: Secondary | ICD-10-CM

## 2019-01-08 DIAGNOSIS — R197 Diarrhea, unspecified: Secondary | ICD-10-CM

## 2019-01-08 DIAGNOSIS — R7989 Other specified abnormal findings of blood chemistry: Secondary | ICD-10-CM

## 2019-01-08 LAB — CBC WITH DIFFERENTIAL/PLATELET
Abs Immature Granulocytes: 0.4 10*3/uL — ABNORMAL HIGH (ref 0.00–0.07)
Basophils Absolute: 0 10*3/uL (ref 0.0–0.1)
Basophils Relative: 0 %
Eosinophils Absolute: 0 10*3/uL (ref 0.0–0.5)
Eosinophils Relative: 0 %
HCT: 38.9 % (ref 36.0–46.0)
Hemoglobin: 11.5 g/dL — ABNORMAL LOW (ref 12.0–15.0)
Immature Granulocytes: 3 %
Lymphocytes Relative: 15 %
Lymphs Abs: 2 10*3/uL (ref 0.7–4.0)
MCH: 25.2 pg — ABNORMAL LOW (ref 26.0–34.0)
MCHC: 29.6 g/dL — ABNORMAL LOW (ref 30.0–36.0)
MCV: 85.3 fL (ref 80.0–100.0)
Monocytes Absolute: 0.9 10*3/uL (ref 0.1–1.0)
Monocytes Relative: 7 %
Neutro Abs: 9.8 10*3/uL — ABNORMAL HIGH (ref 1.7–7.7)
Neutrophils Relative %: 75 %
Platelets: 320 10*3/uL (ref 150–400)
RBC: 4.56 MIL/uL (ref 3.87–5.11)
RDW: 16.8 % — ABNORMAL HIGH (ref 11.5–15.5)
WBC: 13.1 10*3/uL — ABNORMAL HIGH (ref 4.0–10.5)
nRBC: 0 % (ref 0.0–0.2)

## 2019-01-08 LAB — BASIC METABOLIC PANEL
Anion gap: 10 (ref 5–15)
BUN: 13 mg/dL (ref 8–23)
CO2: 18 mmol/L — ABNORMAL LOW (ref 22–32)
Calcium: 7.7 mg/dL — ABNORMAL LOW (ref 8.9–10.3)
Chloride: 108 mmol/L (ref 98–111)
Creatinine, Ser: 0.74 mg/dL (ref 0.44–1.00)
GFR calc Af Amer: >60 mL/min (ref >60)
GFR calc non Af Amer: >60 mL/min (ref >60)
Glucose, Bld: 118 mg/dL — ABNORMAL HIGH (ref 70–99)
Potassium: 3.7 mmol/L (ref 3.5–5.1)
Sodium: 136 mmol/L (ref 135–145)

## 2019-01-08 LAB — GLUCOSE, CAPILLARY
Glucose-Capillary: 126 mg/dL — ABNORMAL HIGH (ref 70–99)
Glucose-Capillary: 139 mg/dL — ABNORMAL HIGH (ref 70–99)
Glucose-Capillary: 203 mg/dL — ABNORMAL HIGH (ref 70–99)
Glucose-Capillary: 235 mg/dL — ABNORMAL HIGH (ref 70–99)
Glucose-Capillary: 247 mg/dL — ABNORMAL HIGH (ref 70–99)
Glucose-Capillary: 374 mg/dL — ABNORMAL HIGH (ref 70–99)

## 2019-01-08 LAB — TISSUE TRANSGLUTAMINASE, IGA: Tissue Transglutaminase Ab, IgA: <2 U/mL (ref 0–3)

## 2019-01-08 LAB — GLIADIN ANTIBODIES, SERUM
Antigliadin Abs, IgA: 4 units (ref 0–19)
Gliadin IgG: 2 units (ref 0–19)

## 2019-01-08 NOTE — Progress Notes (Signed)
Inpatient Diabetes Program Recommendations  AACE/ADA: New Consensus Statement on Inpatient Glycemic Control (2015)  Target Ranges:  Prepandial:   less than 140 mg/dL      Peak postprandial:   less than 180 mg/dL (1-2 hours)      Critically ill patients:  140 - 180 mg/dL   Lab Results  Component Value Date   GLUCAP 126 (H) 01/08/2019   HGBA1C 11.6 (H) 10/11/2018   Results for Benton, Leslie (MRN [id number]) as of 01/08/2019 11:50  Ref. Range 10/11/2018 15:26  Hemoglobin A1C Latest Ref Range: 4.6 - 6.5 % 11.6 (H)   Review of Glycemic Control  Diabetes history: DM 2 Outpatient Diabetes medications: Metformin 1000 mg bid Current orders for Inpatient glycemic control:  Novolog 0-9 units Q4 hours  Inpatient Diabetes Program Recommendations:    Consider repeating A1c level to assess glucose control over the past 2-3 months.  Thanks,  Tama Headings RN, MSN, BC-ADM Inpatient Diabetes Coordinator Team Pager [phone] (8a-5p)

## 2019-01-08 NOTE — Progress Notes (Signed)
PROGRESS NOTE    Leslie Benton  [id number] DOB: 09-28-1940 DOA: 01/05/2019 PCP: [name], MD   Brief Narrative:  Leslie Benton y.o.female,w anxiety/depression, chronic lower back pain, hypertension, hyperlipidemia, Dm2, Hypothyroidism, hemorrhage of the anus ?, apparently presents with c/o diarrhea for the past week. Liquid stool about 5x per day. Sometimes blood. Pt denies fever, chills, cp, palp, sob, n/v, abd pain, black stool   Assessment & Plan:   Principal Problem:   Diarrhea Active Problems:   Hypothyroidism   Essential hypertension   GERD   Anemia   Tachycardia   Elevated troponin   SVT (supraventricular tachycardia) (HCC)   Diarrhea, ? Colitis GI pathogen panelstill pending C. Diffneg Check celiac panelpending C/W Cipro iv, Flagyl iv pharmacy to dose  Blood in stool ? ? Chronic issue, none noted in EMR, seen in past by Buccini ordered hemoccult-black stool IN emr, STILL PENDING Hgb stable at 11.5 x 2  Tachycardia with elev trop Tele Trop Ielevated-appreciate cards input D dimerelev, negCTA chest Check cardiac echo- pending C/w amio pre carrds  Hypertension Cont Lisinopril 10mg  po qday Cont Propranolol 20mg  po bid  Dm2 FSBS q4h, ISS Hold Amaryl STOP Metformin secondary to diarrhea Start Levemir 10 units [place] qday  Hypothyroidism Cont Levothyroxine175 micrograms po qday  DVT prophylaxis: SCD/Compression stockings  Code Status: full Code Status History    Date Active Date Inactive Code Status Order ID Comments User Context   09/23/2014 1229 09/25/2014 1653 Full Code [id number]  [name], MD Inpatient   07/02/2014 2040 07/04/2014 2122 Full Code [id number]  [name], MD ED   09/28/2013 1821 09/30/2013 1718 Full Code [id number]  [name], MD Inpatient   Advance Care Planning Activity    Family Communication: none today Disposition Plan:   Patient remained inpatient for continued treatment of colitis,  with IV antibiotics, fluid resuscitation as well as subspecialty evaluation and adjustment of medications for atrial fibrillation requiring antiarrhythmics. Consults called: None Admission status: Inpatient   Consultants:   cards  Procedures:  Ct Angio Chest Pe W Or Wo Contrast  Result Date: 01/06/2019 CLINICAL DATA:  Shortness of breath. Positive D-dimer. Patient presented with abdominal pain, diarrhea and weakness and underwent abdomen and pelvis CT scan last night, which showed findings of colitis. EXAM: CT ANGIOGRAPHY CHEST WITH CONTRAST TECHNIQUE: Multidetector CT imaging of the chest was performed using the standard protocol during bolus administration of intravenous contrast. Multiplanar CT image reconstructions and MIPs were obtained to evaluate the vascular anatomy. CONTRAST:  165mL OMNIPAQUE IOHEXOL 350 MG/ML SOLN COMPARISON:  Recent abdomen and pelvis CT. Chest radiograph dated 09/29/2013. FINDINGS: Cardiovascular: There is satisfactory opacification of the pulmonary arteries to the segmental level. There is no evidence of a pulmonary embolism. Heart is normal in size. Minor left coronary artery calcifications. No pericardial effusion. Great vessels are normal caliber. Aortic atherosclerosis. No dissection. Mediastinum/Nodes: Moderate-sized hiatal hernia. No neck base, mediastinal or hilar masses or enlarged lymph nodes. Trachea is unremarkable. Lungs/Pleura: No evidence of pneumonia or pulmonary edema. 5 mm ground-glass nodule, right upper lobe, image 35, series 6. 2-3 mm nodule, peripheral left lower lobe, image 69, series 6. No other nodules. Minor dependent lower lobe atelectasis, greater on the right. No pleural effusion or pneumothorax. Upper Abdomen: Partly imaged left transverse colon mild adjacent hazy inflammation consistent colitis as noted on the current abdomen and pelvis CT. Musculoskeletal: No fracture or acute finding. No osteoblastic or osteolytic lesions. Review of the MIP  images confirms the above  findings. IMPRESSION: 1. No evidence of a pulmonary embolism. 2. No acute findings. 3. Two small lung nodules, largest a 5 mm right upper lobe ground-glass nodule. No follow-up needed if patient is low-risk (and has no known or suspected primary neoplasm). Non-contrast chest CT can be considered in 12 months if patient is high-risk. This recommendation follows the consensus statement: Guidelines for Management of Incidental Pulmonary Nodules Detected on CT Images: From the Fleischner Society 2017; Radiology 2017; 284:228-243. 4. Moderate hiatal hernia. Aortic Atherosclerosis (ICD10-I70.0). Electronically Signed   By: Lajean Manes M.D.   On: 01/06/2019 05:51   Ct Abdomen Pelvis W Contrast  Result Date: 01/05/2019 CLINICAL DATA:  Abdominal pain, diarrhea, weakness EXAM: CT ABDOMEN AND PELVIS WITH CONTRAST TECHNIQUE: Multidetector CT imaging of the abdomen and pelvis was performed using the standard protocol following bolus administration of intravenous contrast. CONTRAST:  142mL OMNIPAQUE IOHEXOL 300 MG/ML  SOLN COMPARISON:  None. FINDINGS: Lower chest: No acute abnormality.  Moderate hiatal hernia. Hepatobiliary: No solid liver abnormality is seen. No gallstones, gallbladder wall thickening, or biliary dilatation. Pancreas: Unremarkable. No pancreatic ductal dilatation or surrounding inflammatory changes. Spleen: Normal in size without significant abnormality. Adrenals/Urinary Tract: Adrenal glands are unremarkable. Kidneys are normal, without renal calculi, solid lesion, or hydronephrosis. Bladder is unremarkable. Stomach/Bowel: Stomach is within normal limits. Appendix is not clearly visualized and may be surgically absent. There is diffuse colonic wall thickening and vascular combing most conspicuous involving the transverse colon to the rectum. Fluid-filled distal colon and rectum. Vascular/Lymphatic: Aortic atherosclerosis. No enlarged abdominal or pelvic lymph nodes. Reproductive:  No mass or other significant abnormality. Other: Small, fat containing umbilical hernia. No abdominopelvic ascites. Musculoskeletal: No acute or significant osseous findings. IMPRESSION: 1. There is diffuse colonic wall thickening and vascular combing most conspicuous involving the transverse colon to the rectum. Fluid-filled distal colon and rectum. Findings are consistent with nonspecific infectious, inflammatory, or ischemic colitis. 2.  Hiatal hernia. 3.  Aortic Atherosclerosis (ICD10-I70.0). Electronically Signed   By: Eddie Candle M.D.   On: 01/05/2019 21:56     Antimicrobials:   cipro flagyl   Subjective: Patient reports still feeling poorly although significantly better than yesterday on my eval Answering questions appropriately no evidence of confusion as was noted yesterday  Objective: Vitals:   01/08/19 0407 01/08/19 0500 01/08/19 0900 01/08/19 1354  BP: (!) 90/57 107/62 125/71 (!) 126/92  Pulse: 67  (!) 104 100  Resp: 16   18  Temp: 98.6 F (37 C)   97.6 F (36.4 C)  TempSrc:    Oral  SpO2: 100%   96%  Weight:      Height:        Intake/Output Summary (Last 24 hours) at 01/08/2019 1454 Last data filed at 01/08/2019 1300 Gross per 24 hour  Intake 1734.5 ml  Output 0 ml  Net 1734.5 ml   Filed Weights   01/05/19 1856  Weight: 74.8 kg    Examination:  General exam: Appears calm and comfortable  Respiratory system: Clear to auscultation. Respiratory effort normal. Cardiovascular system: S1 & S2 heard, RRR. No JVD, murmurs, rubs, gallops or clicks. No pedal edema. Gastrointestinal system: Abdomen is nondistended, soft and nontender. No organomegaly or masses felt. Normal bowel sounds heard. Central nervous system: Alert and oriented. No focal neurological deficits. Extremities: wwp, no contractures Skin: No rashes, lesions or ulcers Psychiatry: Judgement and insight appear normal. Mood & affect appropriate.     Data Reviewed: I have personally reviewed  following labs and  imaging studies  CBC: Recent Labs  Lab 01/05/19 1939 01/07/19 0402 01/08/19 0423  WBC 15.0* 10.3 13.1*  NEUTROABS  --  6.7 9.8*  HGB 13.0 11.5* 11.5*  HCT 43.0 38.1 38.9  MCV 83.3 83.6 85.3  PLT 407* 326 [id number]   Basic Metabolic Panel: Recent Labs  Lab 01/05/19 1939 01/07/19 0402 01/08/19 0423  NA 133* 137 136  K 5.3* 3.5 3.7  CL 98 111 108  CO2 22 19* 18*  GLUCOSE 578* 91 118*  BUN 26* 16 13  CREATININE 0.93 0.59 0.74  CALCIUM 8.5* 7.7* 7.7*   GFR: Estimated Creatinine Clearance: 55.3 mL/min (by C-G formula based on SCr of 0.74 mg/dL). Liver Function Tests: Recent Labs  Lab 01/05/19 1939  AST 9*  ALT 12  ALKPHOS 118  BILITOT 0.4  PROT 6.6  ALBUMIN 3.0*   Recent Labs  Lab 01/05/19 1939  LIPASE 27   No results for input(s): AMMONIA in the last 168 hours. Coagulation Profile: No results for input(s): INR, PROTIME in the last 168 hours. Cardiac Enzymes: No results for input(s): CKTOTAL, CKMB, CKMBINDEX, TROPONINI in the last 168 hours. BNP (last 3 results) No results for input(s): PROBNP in the last 8760 hours. HbA1C: No results for input(s): HGBA1C in the last 72 hours. CBG: Recent Labs  Lab 01/07/19 2039 01/08/19 0027 01/08/19 0404 01/08/19 0803 01/08/19 1257  GLUCAP 375* 235* 139* 126* 247*   Lipid Profile: No results for input(s): CHOL, HDL, LDLCALC, TRIG, CHOLHDL, LDLDIRECT in the last 72 hours. Thyroid Function Tests: Recent Labs    01/06/19 0138  TSH 0.789   Anemia Panel: No results for input(s): VITAMINB12, FOLATE, FERRITIN, TIBC, IRON, RETICCTPCT in the last 72 hours. Sepsis Labs: No results for input(s): PROCALCITON, LATICACIDVEN in the last 168 hours.  Recent Results (from the past 240 hour(s))  C difficile quick scan w PCR reflex     Status: None   Collection Time: 01/05/19  9:04 PM   Specimen: STOOL  Result Value Ref Range Status   C Diff antigen NEGATIVE NEGATIVE Final   C Diff toxin NEGATIVE NEGATIVE  Final   C Diff interpretation No C. difficile detected.  Final    Comment: Performed at [facility], [place] [address] [place], [place] [address]  SARS Coronavirus 2 [facility] order, Performed in [facility] hospital lab) Nasopharyngeal Nasopharyngeal Swab     Status: None   Collection Time: 01/05/19  9:04 PM   Specimen: Nasopharyngeal Swab  Result Value Ref Range Status   SARS Coronavirus 2 NEGATIVE NEGATIVE Final    Comment: (NOTE) If result is NEGATIVE SARS-CoV-2 target nucleic acids are NOT DETECTED. The SARS-CoV-2 RNA is generally detectable in upper and lower  respiratory specimens during the acute phase of infection. The lowest  concentration of SARS-CoV-2 viral copies this assay can detect is 250  copies / mL. A negative result does not preclude SARS-CoV-2 infection  and should not be used as the sole basis for treatment or other  patient management decisions.  A negative result may occur with  improper specimen collection / handling, submission of specimen other  than nasopharyngeal swab, presence of viral mutation(s) within the  areas targeted by this assay, and inadequate number of viral copies  (<250 copies / mL). A negative result must be combined with clinical  observations, patient history, and epidemiological information. If result is POSITIVE SARS-CoV-2 target nucleic acids are DETECTED. The SARS-CoV-2 RNA is generally detectable in upper and lower  respiratory  specimens dur ing the acute phase of infection.  Positive  results are indicative of active infection with SARS-CoV-2.  Clinical  correlation with patient history and other diagnostic information is  necessary to determine patient infection status.  Positive results do  not rule out bacterial infection or co-infection with other viruses. If result is PRESUMPTIVE POSTIVE SARS-CoV-2 nucleic acids MAY BE PRESENT.   A presumptive positive result was obtained on the submitted specimen  and  confirmed on repeat testing.  While 2019 novel coronavirus  (SARS-CoV-2) nucleic acids may be present in the submitted sample  additional confirmatory testing may be necessary for epidemiological  and / or clinical management purposes  to differentiate between  SARS-CoV-2 and other Sarbecovirus currently known to infect humans.  If clinically indicated additional testing with an alternate test  methodology [phone]) is advised. The SARS-CoV-2 RNA is generally  detectable in upper and lower respiratory sp ecimens during the acute  phase of infection. The expected result is Negative. Fact Sheet for Patients:  StrictlyIdeas.no Fact Sheet for Healthcare Providers: BankingDealers.co.za This test is not yet approved or cleared by the [place] FDA and has been authorized for detection and/or diagnosis of SARS-CoV-2 by FDA under an Emergency Use Authorization (EUA).  This EUA will remain in effect (meaning this test can be used) for the duration of the COVID-19 declaration under Section 564(b)(1) of the Act, 21 U.S.C. section 360bbb-3(b)(1), unless the authorization is terminated or revoked sooner. Performed at [facility], [place] [address] [place], [place] [address]          Radiology Studies: No results found.      Scheduled Meds:  amiodarone  200 mg Oral BID   busPIRone  15 mg Oral Daily   fluticasone  2 spray Each Nare Daily   insulin aspart  0-9 Units Subcutaneous Q4H   levothyroxine  175 mcg Oral QAC breakfast   lisinopril  10 mg Oral Daily   metoprolol tartrate  25 mg Oral QID   Continuous Infusions:  ciprofloxacin 400 mg (01/08/19 0913)   metronidazole 500 mg (01/08/19 0520)     LOS: 2 days    Time spent: 35 min    Nicolette Bang, MD [facility]  If 7PM-7AM, please contact night-coverage  01/08/2019, 2:54 PM

## 2019-01-08 NOTE — Progress Notes (Addendum)
Progress Note  Patient Name: Leslie Benton Date of Encounter: 01/08/2019  Primary Cardiologist: Dr. Harrington Challenger   Subjective   No cardiac complaints. Denies CP and dyspnea. No palpitations. Tolerating a clear liquid diet but still having diarrhea.   Inpatient Medications    Scheduled Meds: . amiodarone  200 mg Oral BID  . busPIRone  15 mg Oral Daily  . fluticasone  2 spray Each Nare Daily  . insulin aspart  0-9 Units Subcutaneous Q4H  . levothyroxine  175 mcg Oral QAC breakfast  . lisinopril  10 mg Oral Daily  . metoprolol tartrate  25 mg Oral QID   Continuous Infusions: . ciprofloxacin 400 mg (01/07/19 2340)  . metronidazole 500 mg (01/08/19 0520)   PRN Meds: loratadine, mesalamine   Vital Signs    Vitals:   01/07/19 2030 01/07/19 2247 01/08/19 0407 01/08/19 0500  BP: 125/62 108/66 (!) 90/57 107/62  Pulse: 92 92 67   Resp: 14  16   Temp: 98.2 F (36.8 C)  98.6 F (37 C)   TempSrc:      SpO2: 95%  100%   Weight:      Height:        Intake/Output Summary (Last 24 hours) at 01/08/2019 0817 Last data filed at 01/08/2019 0600 Gross per 24 hour  Intake 1814.5 ml  Output 0 ml  Net 1814.5 ml   Last 3 Weights 01/05/2019 12/19/2018 07/04/2018  Weight (lbs) 164 lb 14.5 oz 165 lb 171 lb  Weight (kg) 74.8 kg 74.844 kg 77.565 kg      Telemetry    Sinus tach, low 100s, no recurrent SVT on tele - Personally Reviewed  ECG    No new EKG to review today - Personally Reviewed  Physical Exam   GEN: elderly WF in no acute distress.   Neck: No JVD Cardiac: regular rhythm, mildly tachy rate , no murmurs, rubs, or gallops.  Respiratory: Clear to auscultation bilaterally. GI: Soft, nontender, non-distended  MS: No edema; No deformity. Bilateral SCDs Neuro:  Nonfocal  Psych: Normal affect   Labs    High Sensitivity Troponin:   Recent Labs  Lab 01/06/19 0138  TROPONINIHS 221*      Chemistry Recent Labs  Lab 01/05/19 1939 01/07/19 0402 01/08/19 0423  NA 133* 137  136  K 5.3* 3.5 3.7  CL 98 111 108  CO2 22 19* 18*  GLUCOSE 578* 91 118*  BUN 26* 16 13  CREATININE 0.93 0.59 0.74  CALCIUM 8.5* 7.7* 7.7*  PROT 6.6  --   --   ALBUMIN 3.0*  --   --   AST 9*  --   --   ALT 12  --   --   ALKPHOS 118  --   --   BILITOT 0.4  --   --   GFRNONAA 58* >60 >60  GFRAA >60 >60 >60  ANIONGAP 13 7 10      Hematology Recent Labs  Lab 01/05/19 1939 01/07/19 0402 01/08/19 0423  WBC 15.0* 10.3 13.1*  RBC 5.16* 4.56 4.56  HGB 13.0 11.5* 11.5*  HCT 43.0 38.1 38.9  MCV 83.3 83.6 85.3  MCH 25.2* 25.2* 25.2*  MCHC 30.2 30.2 29.6*  RDW 16.6* 16.6* 16.8*  PLT 407* 326 320    BNPNo results for input(s): BNP, PROBNP in the last 168 hours.   DDimer  Recent Labs  Lab 01/06/19 0138  DDIMER 1.32*     Radiology    No results found.  Cardiac Studies   Remote 2D Echo 2016,  Study Conclusions   - Left ventricle: The cavity size was normal. There was moderate  focal basal hypertrophy of the septum. Systolic function was  normal. The estimated ejection fraction was in the range of 55%  to 60%. Wall motion was normal; there were no regional wall  motion abnormalities. Doppler parameters are consistent with  abnormal left ventricular relaxation (grade 1 diastolic  dysfunction). Doppler parameters are consistent with high  ventricular filling pressure.  - Mitral valve: There was mild regurgitation.  - Left atrium: The atrium was mildly dilated.   Impressions:   - Normal LV function; grade 1 diastolic dysfunction with elevated  filling pressure; mild LAE; mild MR.   Patient Profile     78 y.o. female with hx of HTN, HL admitted with diarrhea, ? Colitis (GI pathogen panel still pending).  Found to be anemic and tachycardic w/ runs of SVT.  Cardiology consulted regarding HS trop of 221.    Assessment & Plan   1. Tachycardia: pt observed to have frequent runs of SVT over the weekend. Personally reviewed by Dr. Harrington Challenger and tachyarhythmia  not felt to be atrial fibrillation. Suspected to be atrial tachycardia. This is in the setting acute GI illness w/ frequent diarrhea. Soft BP limits titration of  blocker. PO amiodarone added, initially 400 bid>> titrated down to 200 bid.  Sinus tach on tele currently, HR low 100s  No recurrent atrial tach in past 24 hrs  K stable at 3.7  TSH WNL  Continue amiodarone 200 mg bid   Continue metoprolol 25 bid   Given pt has been completely asymptomatic, Dr. Harrington Challenger recommended outpatient f/u with 3 day Holter monitor for further surveillance once discharged. Will will arrange for this.   2. Elevated Troponin: abnormal x 1, at  221 ng/L. Enzymes not cycled to assess trend. Regardless, suspect troponin elevation is most likely due to demand/strain in the setting of significant tachycardia and acute GI illness.   No further w/u   3. Diarrhea: ? Colitis. GI pathogen panel still pending.   Management per IM.   On Cipro + Flagyl   Monitor her electrolytes, K and Mg, given her tachycardia. Supplement as needed.   For questions or updates, please contact [place] Please consult [email or web address] for contact info under        Signed, Lyda Jester, PA-C  01/08/2019, 8:17 AM     Attending Note:   The patient was seen and examined.  Agree with assessment and plan as noted above.  Changes made to the above note as needed.  Patient seen and independently examined with  Ellen Henri, PA .   We discussed all aspects of the encounter. I agree with the assessment and plan as stated above.  1.   Tachycardia:  Associated with an acute GI illness.   HR is gradually improving  Her PO intake is improving .  Continue current meds for now   2.  Elevated troponin:  Likely due to her acute illness / demand ischemia   I have spent a total of 40 minutes with patient reviewing hospital  notes , telemetry, EKGs, labs and examining patient as well as establishing an assessment and plan that  was discussed with the patient. > 50% of time was spent in direct patient care.    Thayer Headings, Brooke Bonito., MD, [facility] 01/08/2019, 12:56 PM [address]  [place] Pager [phone]

## 2019-01-09 LAB — OCCULT BLOOD X 1 CARD TO LAB, STOOL: Fecal Occult Bld: POSITIVE — AB

## 2019-01-09 LAB — BASIC METABOLIC PANEL
Anion gap: 7 (ref 5–15)
BUN: 9 mg/dL (ref 8–23)
CO2: 18 mmol/L — ABNORMAL LOW (ref 22–32)
Calcium: 7.5 mg/dL — ABNORMAL LOW (ref 8.9–10.3)
Chloride: 110 mmol/L (ref 98–111)
Creatinine, Ser: 0.75 mg/dL (ref 0.44–1.00)
GFR calc Af Amer: >60 mL/min (ref >60)
GFR calc non Af Amer: >60 mL/min (ref >60)
Glucose, Bld: 168 mg/dL — ABNORMAL HIGH (ref 70–99)
Potassium: 4 mmol/L (ref 3.5–5.1)
Sodium: 135 mmol/L (ref 135–145)

## 2019-01-09 LAB — GLUCOSE, CAPILLARY
Glucose-Capillary: 135 mg/dL — ABNORMAL HIGH (ref 70–99)
Glucose-Capillary: 138 mg/dL — ABNORMAL HIGH (ref 70–99)
Glucose-Capillary: 182 mg/dL — ABNORMAL HIGH (ref 70–99)
Glucose-Capillary: 297 mg/dL — ABNORMAL HIGH (ref 70–99)
Glucose-Capillary: 321 mg/dL — ABNORMAL HIGH (ref 70–99)

## 2019-01-09 MED ORDER — ONDANSETRON HCL 4 MG/2ML IJ SOLN
4.0000 mg | Freq: Four times a day (QID) | INTRAMUSCULAR | Status: DC | PRN
  Administered 2019-01-09 – 2019-01-11 (×4): 4 mg via INTRAVENOUS
  Filled 2019-01-09 (×4): qty 2

## 2019-01-09 MED ORDER — AMIODARONE HCL 200 MG PO TABS
200.0000 mg | ORAL_TABLET | Freq: Every day | ORAL | Status: DC
  Administered 2019-01-10 – 2019-01-14 (×5): 200 mg via ORAL
  Filled 2019-01-09 (×5): qty 1

## 2019-01-09 NOTE — Plan of Care (Signed)
  Problem: Elimination: Goal: Will not experience complications related to bowel motility Outcome: Progressing   Problem: Elimination: Goal: Will not experience complications related to urinary retention Outcome: Progressing   Problem: Safety: Goal: Ability to remain free from injury will improve Outcome: Progressing   Problem: Skin Integrity: Goal: Risk for impaired skin integrity will decrease Outcome: Progressing   

## 2019-01-09 NOTE — Progress Notes (Signed)
PROGRESS NOTE    [name]  [id number] DOB: 08/29/1940 DOA: 01/05/2019 PCP: [name], MD   Brief Narrative:  Leslie Benton y.o.female,w anxiety/depression, chronic lower back pain, hypertension, hyperlipidemia, Dm2, Hypothyroidism, hemorrhage of the anus ?, apparently presents with c/o diarrhea for the past week. Liquid stool about 5x per day. Sometimes blood. Pt denies fever, chills, cp, palp, sob, n/v, abd pain, black stool   Assessment & Plan:   Principal Problem:   Diarrhea Active Problems:   Hypothyroidism   Essential hypertension   GERD   Anemia   Tachycardia   Elevated troponin   SVT (supraventricular tachycardia) (HCC)   Diarrhea / Colitis GI pathogen panelstillpending-discussed with nursing C. Diffneg  Check celiac panelpending C/W Cipro iv, Flagyl iv pharmacy to dose  Blood in stool? Probable chronic issue,none noted in EMR,seen in past by Buccini ordered hemoccult-black stool noted in EMR note but no labs noted, STILL PENDING Hgb stable at 11.5 x 2  Tachycardiawith elev trop Tele Trop Ielevated-appreciate cards input D dimerelev, negCTA chest Check cardiac echo- pending C/w amio pre carrds  Hypertension Cont Lisinopril 10mg  po qday Cont Propranolol 20mg  po bid  Dm2 FSBS q4h, ISS Hold Amaryl STOP Metformin secondary to diarrhea Start Levemir 10 units Jennings qday  Hypothyroidism Cont Levothyroxine175 micrograms po qday  DVT prophylaxis: SCD/Compression stockings  Code Status: Full Code Status History    Date Active Date Inactive Code Status Order ID Comments User Context   09/23/2014 1229 09/25/2014 1653 Full Code [id number]  Leslie Arabian, MD Inpatient   07/02/2014 2040 07/04/2014 2122 Full Code [id number]  Leslie Lever, MD ED   09/28/2013 1821 09/30/2013 1718 Full Code [id number]  [name], MD Inpatient   Advance Care Planning Activity     Family Communication: none today  Disposition Plan:    Patient remained inpatient, still confused still weak will likely need placement, no safe discharge plan at this point Consults called: None Admission status: Inpatient   Consultants:   None  Procedures:  Ct Angio Chest Pe W Or Wo Contrast  Result Date: 01/06/2019 CLINICAL DATA:  Shortness of breath. Positive D-dimer. Patient presented with abdominal pain, diarrhea and weakness and underwent abdomen and pelvis CT scan last night, which showed findings of colitis. EXAM: CT ANGIOGRAPHY CHEST WITH CONTRAST TECHNIQUE: Multidetector CT imaging of the chest was performed using the standard protocol during bolus administration of intravenous contrast. Multiplanar CT image reconstructions and MIPs were obtained to evaluate the vascular anatomy. CONTRAST:  159mL OMNIPAQUE IOHEXOL 350 MG/ML SOLN COMPARISON:  Recent abdomen and pelvis CT. Chest radiograph dated 09/29/2013. FINDINGS: Cardiovascular: There is satisfactory opacification of the pulmonary arteries to the segmental level. There is no evidence of a pulmonary embolism. Heart is normal in size. Minor left coronary artery calcifications. No pericardial effusion. Great vessels are normal caliber. Aortic atherosclerosis. No dissection. Mediastinum/Nodes: Moderate-sized hiatal hernia. No neck base, mediastinal or hilar masses or enlarged lymph nodes. Trachea is unremarkable. Lungs/Pleura: No evidence of pneumonia or pulmonary edema. 5 mm ground-glass nodule, right upper lobe, image 35, series 6. 2-3 mm nodule, peripheral left lower lobe, image 69, series 6. No other nodules. Minor dependent lower lobe atelectasis, greater on the right. No pleural effusion or pneumothorax. Upper Abdomen: Partly imaged left transverse colon mild adjacent hazy inflammation consistent colitis as noted on the current abdomen and pelvis CT. Musculoskeletal: No fracture or acute finding. No osteoblastic or osteolytic lesions. Review of the MIP images confirms the above findings.  IMPRESSION: 1.  No evidence of a pulmonary embolism. 2. No acute findings. 3. Two small lung nodules, largest a 5 mm right upper lobe ground-glass nodule. No follow-up needed if patient is low-risk (and has no known or suspected primary neoplasm). Non-contrast chest CT can be considered in 12 months if patient is high-risk. This recommendation follows the consensus statement: Guidelines for Management of Incidental Pulmonary Nodules Detected on CT Images: From the Fleischner Society 2017; Radiology 2017; 284:228-243. 4. Moderate hiatal hernia. Aortic Atherosclerosis (ICD10-I70.0). Electronically Signed   By: Lajean Manes M.D.   On: 01/06/2019 05:51   Ct Abdomen Pelvis W Contrast  Result Date: 01/05/2019 CLINICAL DATA:  Abdominal pain, diarrhea, weakness EXAM: CT ABDOMEN AND PELVIS WITH CONTRAST TECHNIQUE: Multidetector CT imaging of the abdomen and pelvis was performed using the standard protocol following bolus administration of intravenous contrast. CONTRAST:  132mL OMNIPAQUE IOHEXOL 300 MG/ML  SOLN COMPARISON:  None. FINDINGS: Lower chest: No acute abnormality.  Moderate hiatal hernia. Hepatobiliary: No solid liver abnormality is seen. No gallstones, gallbladder wall thickening, or biliary dilatation. Pancreas: Unremarkable. No pancreatic ductal dilatation or surrounding inflammatory changes. Spleen: Normal in size without significant abnormality. Adrenals/Urinary Tract: Adrenal glands are unremarkable. Kidneys are normal, without renal calculi, solid lesion, or hydronephrosis. Bladder is unremarkable. Stomach/Bowel: Stomach is within normal limits. Appendix is not clearly visualized and may be surgically absent. There is diffuse colonic wall thickening and vascular combing most conspicuous involving the transverse colon to the rectum. Fluid-filled distal colon and rectum. Vascular/Lymphatic: Aortic atherosclerosis. No enlarged abdominal or pelvic lymph nodes. Reproductive: No mass or other significant  abnormality. Other: Small, fat containing umbilical hernia. No abdominopelvic ascites. Musculoskeletal: No acute or significant osseous findings. IMPRESSION: 1. There is diffuse colonic wall thickening and vascular combing most conspicuous involving the transverse colon to the rectum. Fluid-filled distal colon and rectum. Findings are consistent with nonspecific infectious, inflammatory, or ischemic colitis. 2.  Hiatal hernia. 3.  Aortic Atherosclerosis (ICD10-I70.0). Electronically Signed   By: Eddie Candle M.D.   On: 01/05/2019 21:56     Antimicrobials:   ccipro flagyl   Subjective: No acute events overnight Reports feeling weak Decreased p.o. intake Clear reports on diarrhea  Objective: Vitals:   01/08/19 2051 01/09/19 0100 01/09/19 0431 01/09/19 1205  BP: 122/72 116/74 112/76 102/78  Pulse: 68 86 76 77  Resp:   18 18  Temp:   98.7 F (37.1 C) 99 F (37.2 C)  TempSrc:   Oral Oral  SpO2:   98% 99%  Weight:      Height:        Intake/Output Summary (Last 24 hours) at 01/09/2019 1343 Last data filed at 01/09/2019 1205 Gross per 24 hour  Intake 793.93 ml  Output 1 ml  Net 792.93 ml   Filed Weights   01/05/19 1856  Weight: 74.8 kg    Examination:  General exam: Somewhat confused Respiratory system: Clear to auscultation. Respiratory effort normal. Cardiovascular system: S1 & S2 heard, RRR. No JVD, murmurs, rubs, gallops or clicks. No pedal edema. Gastrointestinal system: Abdomen is nondistended, soft and mildly tender without focal findings  Central nervous system: Alert, mild confusion no focal neurological deficits. Extremities: Does move all 4 extremities freely, no contractures no focal findings. Skin: No rashes, lesions or ulcers Psychiatry: Judgement and insight impaired. Mood & affect labile    Data Reviewed: I have personally reviewed following labs and imaging studies  CBC: Recent Labs  Lab 01/05/19 1939 01/07/19 0402 01/08/19 0423  WBC 15.0* 10.3  13.1*  NEUTROABS  --  6.7 9.8*  HGB 13.0 11.5* 11.5*  HCT 43.0 38.1 38.9  MCV 83.3 83.6 85.3  PLT 407* 326 [id number]   Basic Metabolic Panel: Recent Labs  Lab 01/05/19 1939 01/07/19 0402 01/08/19 0423 01/09/19 0434  NA 133* 137 136 135  K 5.3* 3.5 3.7 4.0  CL 98 111 108 110  CO2 22 19* 18* 18*  GLUCOSE 578* 91 118* 168*  BUN 26* 16 13 9   CREATININE 0.93 0.59 0.74 0.75  CALCIUM 8.5* 7.7* 7.7* 7.5*   GFR: Estimated Creatinine Clearance: 55.3 mL/min (by C-G formula based on SCr of 0.75 mg/dL). Liver Function Tests: Recent Labs  Lab 01/05/19 1939  AST 9*  ALT 12  ALKPHOS 118  BILITOT 0.4  PROT 6.6  ALBUMIN 3.0*   Recent Labs  Lab 01/05/19 1939  LIPASE 27   No results for input(s): AMMONIA in the last 168 hours. Coagulation Profile: No results for input(s): INR, PROTIME in the last 168 hours. Cardiac Enzymes: No results for input(s): CKTOTAL, CKMB, CKMBINDEX, TROPONINI in the last 168 hours. BNP (last 3 results) No results for input(s): PROBNP in the last 8760 hours. HbA1C: No results for input(s): HGBA1C in the last 72 hours. CBG: Recent Labs  Lab 01/08/19 2032 01/09/19 0017 01/09/19 0430 01/09/19 0750 01/09/19 1214  GLUCAP 203* 135* 138* 182* 297*   Lipid Profile: No results for input(s): CHOL, HDL, LDLCALC, TRIG, CHOLHDL, LDLDIRECT in the last 72 hours. Thyroid Function Tests: No results for input(s): TSH, T4TOTAL, FREET4, T3FREE, THYROIDAB in the last 72 hours. Anemia Panel: No results for input(s): VITAMINB12, FOLATE, FERRITIN, TIBC, IRON, RETICCTPCT in the last 72 hours. Sepsis Labs: No results for input(s): PROCALCITON, LATICACIDVEN in the last 168 hours.  Recent Results (from the past 240 hour(s))  C difficile quick scan w PCR reflex     Status: None   Collection Time: 01/05/19  9:04 PM   Specimen: STOOL  Result Value Ref Range Status   C Diff antigen NEGATIVE NEGATIVE Final   C Diff toxin NEGATIVE NEGATIVE Final   C Diff interpretation No C.  difficile detected.  Final    Comment: Performed at [facility], [place] [address], [place], [place] [address]  SARS Coronavirus 2 [facility] order, Performed in [facility] hospital lab) Nasopharyngeal Nasopharyngeal Swab     Status: None   Collection Time: 01/05/19  9:04 PM   Specimen: Nasopharyngeal Swab  Result Value Ref Range Status   SARS Coronavirus 2 NEGATIVE NEGATIVE Final    Comment: (NOTE) If result is NEGATIVE SARS-CoV-2 target nucleic acids are NOT DETECTED. The SARS-CoV-2 RNA is generally detectable in upper and lower  respiratory specimens during the acute phase of infection. The lowest  concentration of SARS-CoV-2 viral copies this assay can detect is 250  copies / mL. A negative result does not preclude SARS-CoV-2 infection  and should not be used as the sole basis for treatment or other  patient management decisions.  A negative result may occur with  improper specimen collection / handling, submission of specimen other  than nasopharyngeal swab, presence of viral mutation(s) within the  areas targeted by this assay, and inadequate number of viral copies  (<250 copies / mL). A negative result must be combined with clinical  observations, patient history, and epidemiological information. If result is POSITIVE SARS-CoV-2 target nucleic acids are DETECTED. The SARS-CoV-2 RNA is generally detectable in upper and lower  respiratory specimens dur ing the acute phase  of infection.  Positive  results are indicative of active infection with SARS-CoV-2.  Clinical  correlation with patient history and other diagnostic information is  necessary to determine patient infection status.  Positive results do  not rule out bacterial infection or co-infection with other viruses. If result is PRESUMPTIVE POSTIVE SARS-CoV-2 nucleic acids MAY BE PRESENT.   A presumptive positive result was obtained on the submitted specimen  and confirmed on repeat testing.  While 2019  novel coronavirus  (SARS-CoV-2) nucleic acids may be present in the submitted sample  additional confirmatory testing may be necessary for epidemiological  and / or clinical management purposes  to differentiate between  SARS-CoV-2 and other Sarbecovirus currently known to infect humans.  If clinically indicated additional testing with an alternate test  methodology [phone]) is advised. The SARS-CoV-2 RNA is generally  detectable in upper and lower respiratory sp ecimens during the acute  phase of infection. The expected result is Negative. Fact Sheet for Patients:  StrictlyIdeas.no Fact Sheet for Healthcare Providers: BankingDealers.co.za This test is not yet approved or cleared by the [place] FDA and has been authorized for detection and/or diagnosis of SARS-CoV-2 by FDA under an Emergency Use Authorization (EUA).  This EUA will remain in effect (meaning this test can be used) for the duration of the COVID-19 declaration under Section 564(b)(1) of the Act, 21 U.S.C. section 360bbb-3(b)(1), unless the authorization is terminated or revoked sooner. Performed at [facility], [place] [address], [place], [place] [address]          Radiology Studies: No results found.      Scheduled Meds:  [START ON 01/10/2019] amiodarone  200 mg Oral Daily   busPIRone  15 mg Oral Daily   fluticasone  2 spray Each Nare Daily   insulin aspart  0-9 Units Subcutaneous Q4H   levothyroxine  175 mcg Oral QAC breakfast   lisinopril  10 mg Oral Daily   metoprolol tartrate  25 mg Oral QID   Continuous Infusions:  ciprofloxacin 400 mg (01/09/19 0918)   metronidazole 500 mg (01/09/19 0502)     LOS: 3 days    Time spent: 50 min    Nicolette Bang, MD [facility]  If 7PM-7AM, please contact night-coverage  01/09/2019, 1:43 PM

## 2019-01-09 NOTE — Progress Notes (Signed)
Spoke with pt daughter who was inquiring as to why GI wasn't seeing her mother.  Explained the plan of care, spoke with MD and left message letting her know that MD was waiting for test results and would communicate the plan in the morning.

## 2019-01-09 NOTE — Care Management Important Message (Signed)
Important Message  Patient Details IM Letter given to [name] RN to present to the Patient Name: Leslie Benton MRN: [id number] Date of Birth: [birth year]   Medicare Important Message Given:  Yes     Kerin Salen 01/09/2019, 9:57 AM

## 2019-01-09 NOTE — Progress Notes (Addendum)
Progress Note  Patient Name: Leslie Benton Date of Encounter: 01/09/2019  Primary Cardiologist: Dr. Harrington Challenger  Subjective   Main complaint is being hungry. Still waiting for her breakfast tray. Feels weak. I asked is she was still having diarrhea and she responded "I don't know". No cardiac complaints.   Inpatient Medications    Scheduled Meds: . amiodarone  200 mg Oral BID  . busPIRone  15 mg Oral Daily  . fluticasone  2 spray Each Nare Daily  . insulin aspart  0-9 Units Subcutaneous Q4H  . levothyroxine  175 mcg Oral QAC breakfast  . lisinopril  10 mg Oral Daily  . metoprolol tartrate  25 mg Oral QID   Continuous Infusions: . ciprofloxacin 400 mg (01/09/19 0918)  . metronidazole 500 mg (01/09/19 0502)   PRN Meds: loratadine, mesalamine, ondansetron (ZOFRAN) IV   Vital Signs    Vitals:   01/08/19 2036 01/08/19 2051 01/09/19 0100 01/09/19 0431  BP: (!) 95/47 122/72 116/74 112/76  Pulse: 69 68 86 76  Resp: 18   18  Temp: 98 F (36.7 C)   98.7 F (37.1 C)  TempSrc:    Oral  SpO2: 98%   98%  Weight:      Height:        Intake/Output Summary (Last 24 hours) at 01/09/2019 1018 Last data filed at 01/09/2019 0600 Gross per 24 hour  Intake 753.93 ml  Output 1 ml  Net 752.93 ml   Last 3 Weights 01/05/2019 12/19/2018 07/04/2018  Weight (lbs) 164 lb 14.5 oz 165 lb 171 lb  Weight (kg) 74.8 kg 74.844 kg 77.565 kg      Telemetry    NSR, 80s and 90s. No recurrent SVT - Personally Reviewed  ECG    No new EKG to review- Personally Reviewed  Physical Exam   GEN: No acute distress.   Neck: No JVD Cardiac: RRR, no murmurs, rubs, or gallops.  Respiratory: Clear to auscultation bilaterally. GI: Soft, nontender, non-distended  MS: No edema; No deformity. Neuro:  Nonfocal  Psych: Normal affect   Labs    High Sensitivity Troponin:   Recent Labs  Lab 01/06/19 0138  TROPONINIHS 221*      Chemistry Recent Labs  Lab 01/05/19 1939 01/07/19 0402 01/08/19 0423  01/09/19 0434  NA 133* 137 136 135  K 5.3* 3.5 3.7 4.0  CL 98 111 108 110  CO2 22 19* 18* 18*  GLUCOSE 578* 91 118* 168*  BUN 26* 16 13 9   CREATININE 0.93 0.59 0.74 0.75  CALCIUM 8.5* 7.7* 7.7* 7.5*  PROT 6.6  --   --   --   ALBUMIN 3.0*  --   --   --   AST 9*  --   --   --   ALT 12  --   --   --   ALKPHOS 118  --   --   --   BILITOT 0.4  --   --   --   GFRNONAA 58* >60 >60 >60  GFRAA >60 >60 >60 >60  ANIONGAP 13 7 10 7      Hematology Recent Labs  Lab 01/05/19 1939 01/07/19 0402 01/08/19 0423  WBC 15.0* 10.3 13.1*  RBC 5.16* 4.56 4.56  HGB 13.0 11.5* 11.5*  HCT 43.0 38.1 38.9  MCV 83.3 83.6 85.3  MCH 25.2* 25.2* 25.2*  MCHC 30.2 30.2 29.6*  RDW 16.6* 16.6* 16.8*  PLT 407* 326 320    BNPNo results for input(s): BNP,  PROBNP in the last 168 hours.   DDimer  Recent Labs  Lab 01/06/19 0138  DDIMER 1.32*     Radiology    No results found.  Cardiac Studies   Remote 2D Echo 2016,  Study Conclusions   - Left ventricle: The cavity size was normal. There was moderate  focal basal hypertrophy of the septum. Systolic function was  normal. The estimated ejection fraction was in the range of 55%  to 60%. Wall motion was normal; there were no regional wall  motion abnormalities. Doppler parameters are consistent with  abnormal left ventricular relaxation (grade 1 diastolic  dysfunction). Doppler parameters are consistent with high  ventricular filling pressure.  - Mitral valve: There was mild regurgitation.  - Left atrium: The atrium was mildly dilated.   Impressions:   - Normal LV function; grade 1 diastolic dysfunction with elevated  filling pressure; mild LAE; mild MR.   Patient Profile     77 y.o.femalewith hx of HTN, HL admitted with diarrhea, ? Colitis (GI pathogen panel still pending). Found to be anemic and tachycardic w/ runs of SVT. Cardiology consulted regarding HS trop of 221.   Assessment & Plan    1. Tachycardia: pt  observed to have frequent runs of SVT over the weekend. Personally reviewed by Dr. Harrington Challenger and tachyarhythmia not felt to be atrial fibrillation. Suspected to be atrial tachycardia. This is in the setting acute GI illness w/ frequent diarrhea.  Resolved w/ amiodarone  NSR on tele. HR in the 80s-90s. No recurrent tachycardia on tele  Continue amiodarone 200 mg bid   Continue metoprolol 25 bid   Given pt was completely asymptomatic w/ her SVT, Dr. Harrington Challenger recommended outpatient f/u with 3 day Holter monitor for further surveillance once discharged. Will will arrange for this.   2. Elevated Troponin: abnormal x 1, at  221 ng/L. Enzymes not cycled to assess trend. Regardless, suspect troponin elevation is most likely due to demand/strain in the setting of significant tachycardia and acute GI illness.   No further w/u   3. Diarrhea: ? Colitis. Cdiff negative but GI pathogen panel still pending.   Management per IM.   On Cipro + Flagyl   Monitor her electrolytes, K and Mg, given recent tachycardia. Supplement as needed.   Stable from a cardiac standpoint. SVT resolved w/ amiodarone. No recurrence on tele in the last 24 hrs. Continue amiodarone + metoprolol. Cardiology will likely sign off today. MD to follow.   For questions or updates, please contact [place] Please consult [email or web address] for contact info under        Signed, Lyda Jester, PA-C  01/09/2019, 10:18 AM     Attending Note:   The patient was seen and examined.  Agree with assessment and plan as noted above.  Changes made to the above note as needed.  Patient seen and independently examined with Lyda Jester, PA .   We discussed all aspects of the encounter. I agree with the assessment and plan as stated above.  1.   SVT , possible Afib:  Has improved with amiodarone She is stable from a cardiac standpoint.  Will lower amio to 200 mg a day .     I have spent a total of 40 minutes with patient reviewing  hospital  notes , telemetry, EKGs, labs and examining patient as well as establishing an assessment and plan that was discussed with the patient. > 50% of time was spent in direct patient care.  [facility] will  sign off.   Medication Recommendations:  Continue current meds.  Other recommendations (labs, testing, etc):  Per IM team  Follow up as an outpatient:  With Dr. Harrington Challenger several weeks after discharge.     Thayer Headings, Brooke Bonito., MD, [facility] 01/09/2019, 11:51 AM [address],  [place] Pager [phone]

## 2019-01-10 DIAGNOSIS — E860 Dehydration: Secondary | ICD-10-CM

## 2019-01-10 DIAGNOSIS — K529 Noninfective gastroenteritis and colitis, unspecified: Secondary | ICD-10-CM

## 2019-01-10 LAB — GI PATHOGEN PANEL BY PCR, STOOL

## 2019-01-10 LAB — GLUCOSE, CAPILLARY
Glucose-Capillary: 167 mg/dL — ABNORMAL HIGH (ref 70–99)
Glucose-Capillary: 173 mg/dL — ABNORMAL HIGH (ref 70–99)
Glucose-Capillary: 253 mg/dL — ABNORMAL HIGH (ref 70–99)
Glucose-Capillary: 269 mg/dL — ABNORMAL HIGH (ref 70–99)
Glucose-Capillary: 323 mg/dL — ABNORMAL HIGH (ref 70–99)
Glucose-Capillary: 387 mg/dL — ABNORMAL HIGH (ref 70–99)

## 2019-01-10 LAB — BASIC METABOLIC PANEL
Anion gap: 6 (ref 5–15)
BUN: 7 mg/dL — ABNORMAL LOW (ref 8–23)
CO2: 19 mmol/L — ABNORMAL LOW (ref 22–32)
Calcium: 7.7 mg/dL — ABNORMAL LOW (ref 8.9–10.3)
Chloride: 110 mmol/L (ref 98–111)
Creatinine, Ser: 0.72 mg/dL (ref 0.44–1.00)
GFR calc Af Amer: >60 mL/min (ref >60)
GFR calc non Af Amer: >60 mL/min (ref >60)
Glucose, Bld: 196 mg/dL — ABNORMAL HIGH (ref 70–99)
Potassium: 3.6 mmol/L (ref 3.5–5.1)
Sodium: 135 mmol/L (ref 135–145)

## 2019-01-10 LAB — RETICULIN ANTIBODIES, IGA W TITER: Reticulin Ab, IgA: NEGATIVE titer (ref <2.5)

## 2019-01-10 MED ORDER — DIPHENHYDRAMINE HCL 50 MG PO CAPS
50.0000 mg | ORAL_CAPSULE | Freq: Once | ORAL | Status: DC
  Filled 2019-01-10: qty 1

## 2019-01-10 MED ORDER — INSULIN DETEMIR 100 UNIT/ML ~~LOC~~ SOLN
10.0000 [IU] | Freq: Every day | SUBCUTANEOUS | Status: DC
  Administered 2019-01-10: 10 [IU] via SUBCUTANEOUS
  Filled 2019-01-10: qty 0.1

## 2019-01-10 MED ORDER — DIPHENHYDRAMINE HCL 25 MG PO CAPS: 25.0000 mg | ORAL_CAPSULE | ORAL | Status: DC | PRN

## 2019-01-10 NOTE — Progress Notes (Signed)
Mid level provider notified of need for medication for itching. Pt with no visible rash at this time.

## 2019-01-10 NOTE — Evaluation (Signed)
Physical Therapy Evaluation Patient Details Name: Leslie Benton MRN: [id number] DOB: 01/26/1941 Today's Date: 01/10/2019   History of Present Illness  78 yo female admitted to ED on 01/05/19 for diarrhea, possible colitis. Cdiff and GI panel negative. PMH includes anxiety, depression, LBP, HTN, HLD, DMII, hypothyroid, anal hemorrhage (?).  Clinical Impression   Pt presents with generalized weakness, difficulty performing mobility tasks, inability to ambulate today due to fatigue, incontinence of feces and urine requiring total assist for pericare, and decreased activity tolerance. Pt to benefit from acute PT to address deficits. Pt required mod assist for bed mobility and transfers this session, unable to progress to ambulation today due to fatigue and weakness. PT recommending SNF level of care post-acutely to return pt to PLOF and maximize mobility. PT to progress mobility as tolerated, and will continue to follow acutely.      Follow Up Recommendations SNF;Supervision/Assistance - 24 hour    Equipment Recommendations  None recommended by PT    Recommendations for Other Services       Precautions / Restrictions Precautions Precautions: Fall Restrictions Weight Bearing Restrictions: No      Mobility  Bed Mobility Overal bed mobility: Needs Assistance Bed Mobility: Rolling;Sidelying to Sit Rolling: Mod assist Sidelying to sit: Mod assist;HOB elevated       General bed mobility comments: Mod assist for completion of roll onto side, trunk elevation, and scooting to EOB. Increased time and pt states "I am too weak for this".  Transfers Overall transfer level: Needs assistance Equipment used: Rolling walker (2 wheeled) Transfers: Sit to/from [facility] Sit to Stand: Mod assist Stand pivot transfers: Mod assist       General transfer comment: mod assist for power up, steadying, slow eccentric lowering to BSC. Pt very unsteady with heavy anterior leaning to  rise and when standing. sit to stand x3, stand pivot bed>BSC, BSC>recliner.  Ambulation/Gait Ambulation/Gait assistance: (NT - pt able to take few pivotal steps to [facility] and recliner)              Stairs            Wheelchair Mobility    Modified Rankin (Stroke Patients Only)       Balance Overall balance assessment: Needs assistance Sitting-balance support: Feet supported;Bilateral upper extremity supported Sitting balance-Leahy Scale: Fair     Standing balance support: Bilateral upper extremity supported Standing balance-Leahy Scale: Poor Standing balance comment: reliant on external support in standing                             Pertinent Vitals/Pain Pain Assessment: Faces Faces Pain Scale: Hurts little more Pain Location: abdomen Pain Descriptors / Indicators: Discomfort;Grimacing Pain Intervention(s): Monitored during session;Limited activity within patient's tolerance;Repositioned    Home Living Family/patient expects to be discharged to:: Private residence Living Arrangements: Spouse/significant other;Children Available Help at Discharge: Family;Available PRN/intermittently(husband with disability due to history of CVA, pt's son works during the day so helps "when he can") Type of Home: House Home Access: Stairs to enter Entrance Stairs-Rails: Right Entrance Stairs-Number of Steps: 3 Home Layout: One level Home Equipment: [place] - 4 wheels;Bedside commode;Walker - 2 wheels      Prior Function Level of Independence: Independent with assistive device(s)         Comments: pt reports using rollator for ambulation, and doing ADLs for self. Pt's son works during the day and cannot physically assist her or her husband during  the day.     Hand Dominance   Dominant Hand: Right    Extremity/Trunk Assessment   Upper Extremity Assessment Upper Extremity Assessment: Generalized weakness    Lower Extremity Assessment Lower Extremity  Assessment: Generalized weakness    Cervical / Trunk Assessment Cervical / Trunk Assessment: Kyphotic  Communication   Communication: No difficulties  Cognition Arousal/Alertness: Awake/alert Behavior During Therapy: WFL for tasks assessed/performed Overall Cognitive Status: Within Functional Limits for tasks assessed                                        General Comments      Exercises     Assessment/Plan    PT Assessment Patient needs continued PT services  PT Problem List Decreased strength;Decreased mobility;Decreased safety awareness;Decreased activity tolerance;Decreased balance;Decreased knowledge of use of DME;Pain       PT Treatment Interventions DME instruction;Therapeutic activities;Gait training;Therapeutic exercise;Patient/family education;Balance training;Functional mobility training;Neuromuscular re-education    PT Goals (Current goals can be found in the Care Plan section)  Acute Rehab PT Goals Patient Stated Goal: get stronger, "stop poo-poo" PT Goal Formulation: With patient Time For Goal Achievement: 01/24/19 Potential to Achieve Goals: Good    Frequency Min 2X/week   Barriers to discharge Decreased caregiver support son works during the day, pt's husband with history of CVA with longstanding deficits    Co-evaluation               AM-PAC PT "6 Clicks" Mobility  Outcome Measure Help needed turning from your back to your side while in a flat bed without using bedrails?: A Lot Help needed moving from lying on your back to sitting on the side of a flat bed without using bedrails?: A Lot Help needed moving to and from a bed to a chair (including a wheelchair)?: A Lot Help needed standing up from a chair using your arms (e.g., wheelchair or bedside chair)?: A Lot Help needed to walk in hospital room?: Total Help needed climbing 3-5 steps with a railing? : Total 6 Click Score: 10    End of Session Equipment Utilized During  Treatment: Gait belt Activity Tolerance: Patient limited by fatigue;Patient limited by pain Patient left: in chair;with chair alarm set;with call bell/phone within reach Nurse Communication: Mobility status PT Visit Diagnosis: Muscle weakness (generalized) (M62.81);Unsteadiness on feet (R26.81)    Time: [id number] PT Time Calculation (min) (ACUTE ONLY): 35 min   Charges:   PT Evaluation $PT Eval Low Complexity: 1 Low PT Treatments $Therapeutic Activity: 8-22 mins        Lezley Bedgood Conception Chancy, PT Acute Rehabilitation Services Pager [phone]  Office [phone]   Teri Diltz D Elonda Husky 01/10/2019, 12:57 PM

## 2019-01-10 NOTE — Progress Notes (Signed)
PROGRESS NOTE    Leslie Benton  [id number] DOB: [birth year] DOA: 01/05/2019 PCP: [name], MD    Brief Narrative:  78 y.o.female,w anxiety/depression, chronic lower back pain, hypertension, hyperlipidemia, Dm2, Hypothyroidism, hemorrhage of the anus ?, apparently presents with c/o diarrhea for the past week. Liquid stool about 5x per day. Sometimes blood. Pt denies fever, chills, cp, palp, sob, n/v, abd pain, black stool  Assessment & Plan:   Principal Problem:   Diarrhea Active Problems:   Hypothyroidism   Essential hypertension   GERD   Anemia   Tachycardia   Elevated troponin   SVT (supraventricular tachycardia) (HCC)   Diarrhea / Colitis -GI pathogen paneland Cdiff negative, reviewed -celiac panelpending -Pt currently continued on empiricCipro iv, Flagyl iv pharmacy to dose -On further questioning, pt reports several week hx of frequent diarrhea, most recent was earlier today. Five documented stools noted yesterday -Stools are Heme pos. CT abd personally reviewed. Findings of diffuse colonic wall thickening and vascular combing involving the transverse colon to rectum -Given chronic nature of diarrhea, will consult GI for assistance  Blood in stool -Stools are heme pos -hgb stable -Will repeat cbc in AM  Tachycardiawith elev trop -Trop Ielevated-appreciate cards input -D dimer noted to beelevated with follow up CTA chest neg for PE, reviewed -Cardiology following. C/w amioand metoprolol 25mg  BID -Per Cardiology trop elevation likely due to demand/strain in setting of significant tachycardia and acute GI illness  Hypertension -Cont Lisinopril 10mg  po qday -Cont Metoprolol 25mg  po 4 times daily as tolerated  Dm2 -FSBS q4h, ISS -Holding Amaryl while in office -Have stopped Metformin secondary to diarrhea -Glucose in the 300's -Will start levemir 10 units daily  Hypothyroidism Cont Levothyroxine175 micrograms po qday   DVT  prophylaxis: SCD's Code Status: Full Family Communication: Pt in room, family not at bedside Disposition Plan: SNF when stable  Consultants:   GI  Procedures:     Antimicrobials: Anti-infectives (From admission, onward)   Start     Dose/Rate Route Frequency Ordered Stop   01/06/19 1000  ciprofloxacin (CIPRO) IVPB 400 mg     400 mg 200 mL/hr over 60 Minutes Intravenous Every 12 hours 01/06/19 0007     01/06/19 0600  metroNIDAZOLE (FLAGYL) IVPB 500 mg     500 mg 100 mL/hr over 60 Minutes Intravenous Every 8 hours 01/06/19 0007     01/05/19 2215  ciprofloxacin (CIPRO) IVPB 400 mg     400 mg 200 mL/hr over 60 Minutes Intravenous  Once 01/05/19 2200 01/06/19 0031   01/05/19 2215  metroNIDAZOLE (FLAGYL) IVPB 500 mg     500 mg 100 mL/hr over 60 Minutes Intravenous  Once 01/05/19 2200 01/05/19 2321       Subjective: Complaining of continued diarrhea  Objective: Vitals:   01/09/19 1205 01/09/19 1947 01/10/19 0525 01/10/19 1416  BP: 102/78 113/65 (!) 144/69 (!) 121/53  Pulse: 77 65 73 68  Resp: 18 20 20 18   Temp: 99 F (37.2 C) 97.6 F (36.4 C) 98.3 F (36.8 C) 98.2 F (36.8 C)  TempSrc: Oral Oral Oral Oral  SpO2: 99% 97% 99% 94%  Weight:      Height:        Intake/Output Summary (Last 24 hours) at 01/10/2019 1627 Last data filed at 01/10/2019 [id number] Gross per 24 hour  Intake 1564.68 ml  Output -  Net 1564.68 ml   Filed Weights   01/05/19 1856  Weight: 74.8 kg    Examination:  General exam: Appears  calm and comfortable  Respiratory system: Clear to auscultation. Respiratory effort normal. Cardiovascular system: S1 & S2 heard, RRR Gastrointestinal system: Abdomen is nondistended, soft and nontender. No organomegaly or masses felt. Normal bowel sounds heard. Central nervous system: Alert and oriented. No focal neurological deficits. Extremities: Symmetric 5 x 5 power. Skin: No rashes, lesions Psychiatry: Judgement and insight appear normal. Mood & affect  appropriate.   Data Reviewed: I have personally reviewed following labs and imaging studies  CBC: Recent Labs  Lab 01/05/19 1939 01/07/19 0402 01/08/19 0423  WBC 15.0* 10.3 13.1*  NEUTROABS  --  6.7 9.8*  HGB 13.0 11.5* 11.5*  HCT 43.0 38.1 38.9  MCV 83.3 83.6 85.3  PLT 407* 326 [id number]   Basic Metabolic Panel: Recent Labs  Lab 01/05/19 1939 01/07/19 0402 01/08/19 0423 01/09/19 0434 01/10/19 0415  NA 133* 137 136 135 135  K 5.3* 3.5 3.7 4.0 3.6  CL 98 111 108 110 110  CO2 22 19* 18* 18* 19*  GLUCOSE 578* 91 118* 168* 196*  BUN 26* 16 13 9  7*  CREATININE 0.93 0.59 0.74 0.75 0.72  CALCIUM 8.5* 7.7* 7.7* 7.5* 7.7*   GFR: Estimated Creatinine Clearance: 55.3 mL/min (by C-G formula based on SCr of 0.72 mg/dL). Liver Function Tests: Recent Labs  Lab 01/05/19 1939  AST 9*  ALT 12  ALKPHOS 118  BILITOT 0.4  PROT 6.6  ALBUMIN 3.0*   Recent Labs  Lab 01/05/19 1939  LIPASE 27   No results for input(s): AMMONIA in the last 168 hours. Coagulation Profile: No results for input(s): INR, PROTIME in the last 168 hours. Cardiac Enzymes: No results for input(s): CKTOTAL, CKMB, CKMBINDEX, TROPONINI in the last 168 hours. BNP (last 3 results) No results for input(s): PROBNP in the last 8760 hours. HbA1C: No results for input(s): HGBA1C in the last 72 hours. CBG: Recent Labs  Lab 01/09/19 1944 01/10/19 0007 01/10/19 0335 01/10/19 0756 01/10/19 1155  GLUCAP 321* 253* 173* 167* 323*   Lipid Profile: No results for input(s): CHOL, HDL, LDLCALC, TRIG, CHOLHDL, LDLDIRECT in the last 72 hours. Thyroid Function Tests: No results for input(s): TSH, T4TOTAL, FREET4, T3FREE, THYROIDAB in the last 72 hours. Anemia Panel: No results for input(s): VITAMINB12, FOLATE, FERRITIN, TIBC, IRON, RETICCTPCT in the last 72 hours. Sepsis Labs: No results for input(s): PROCALCITON, LATICACIDVEN in the last 168 hours.  Recent Results (from the past 240 hour(s))  C difficile quick scan w  PCR reflex     Status: None   Collection Time: 01/05/19  9:04 PM   Specimen: STOOL  Result Value Ref Range Status   C Diff antigen NEGATIVE NEGATIVE Final   C Diff toxin NEGATIVE NEGATIVE Final   C Diff interpretation No C. difficile detected.  Final    Comment: Performed at [facility], [place] [address], [place], [place] [address]  SARS Coronavirus 2 [facility] order, Performed in [facility] hospital lab) Nasopharyngeal Nasopharyngeal Swab     Status: None   Collection Time: 01/05/19  9:04 PM   Specimen: Nasopharyngeal Swab  Result Value Ref Range Status   SARS Coronavirus 2 NEGATIVE NEGATIVE Final    Comment: (NOTE) If result is NEGATIVE SARS-CoV-2 target nucleic acids are NOT DETECTED. The SARS-CoV-2 RNA is generally detectable in upper and lower  respiratory specimens during the acute phase of infection. The lowest  concentration of SARS-CoV-2 viral copies this assay can detect is 250  copies / mL. A negative result does not preclude SARS-CoV-2  infection  and should not be used as the sole basis for treatment or other  patient management decisions.  A negative result may occur with  improper specimen collection / handling, submission of specimen other  than nasopharyngeal swab, presence of viral mutation(s) within the  areas targeted by this assay, and inadequate number of viral copies  (<250 copies / mL). A negative result must be combined with clinical  observations, patient history, and epidemiological information. If result is POSITIVE SARS-CoV-2 target nucleic acids are DETECTED. The SARS-CoV-2 RNA is generally detectable in upper and lower  respiratory specimens dur ing the acute phase of infection.  Positive  results are indicative of active infection with SARS-CoV-2.  Clinical  correlation with patient history and other diagnostic information is  necessary to determine patient infection status.  Positive results do  not rule out bacterial infection or  co-infection with other viruses. If result is PRESUMPTIVE POSTIVE SARS-CoV-2 nucleic acids MAY BE PRESENT.   A presumptive positive result was obtained on the submitted specimen  and confirmed on repeat testing.  While 2019 novel coronavirus  (SARS-CoV-2) nucleic acids may be present in the submitted sample  additional confirmatory testing may be necessary for epidemiological  and / or clinical management purposes  to differentiate between  SARS-CoV-2 and other Sarbecovirus currently known to infect humans.  If clinically indicated additional testing with an alternate test  methodology [phone]) is advised. The SARS-CoV-2 RNA is generally  detectable in upper and lower respiratory sp ecimens during the acute  phase of infection. The expected result is Negative. Fact Sheet for Patients:  StrictlyIdeas.no Fact Sheet for Healthcare Providers: BankingDealers.co.za This test is not yet approved or cleared by the [place] FDA and has been authorized for detection and/or diagnosis of SARS-CoV-2 by FDA under an Emergency Use Authorization (EUA).  This EUA will remain in effect (meaning this test can be used) for the duration of the COVID-19 declaration under Section 564(b)(1) of the Act, 21 U.S.C. section 360bbb-3(b)(1), unless the authorization is terminated or revoked sooner. Performed at [facility], [place] [address] [place], [place] [address]      Radiology Studies: No results found.  Scheduled Meds: . amiodarone  200 mg Oral Daily  . busPIRone  15 mg Oral Daily  . diphenhydrAMINE  50 mg Oral Once  . fluticasone  2 spray Each Nare Daily  . insulin aspart  0-9 Units Subcutaneous Q4H  . levothyroxine  175 mcg Oral QAC breakfast  . lisinopril  10 mg Oral Daily  . metoprolol tartrate  25 mg Oral QID   Continuous Infusions: . ciprofloxacin 400 mg (01/10/19 1042)  . metronidazole 500 mg (01/10/19 1354)      LOS: 4 days   Marylu Lund, MD [facility] Pager On Amion  If 7PM-7AM, please contact night-coverage 01/10/2019, 4:27 PM

## 2019-01-10 NOTE — Progress Notes (Signed)
Inpatient Diabetes Program Recommendations  AACE/ADA: New Consensus Statement on Inpatient Glycemic Control (2015)  Target Ranges:  Prepandial:   less than 140 mg/dL      Peak postprandial:   less than 180 mg/dL (1-2 hours)      Critically ill patients:  140 - 180 mg/dL   Lab Results  Component Value Date   GLUCAP 167 (H) 01/10/2019   HGBA1C 11.6 (H) 10/11/2018   Results for SARESA, CAPUCHINO (MRN [id number]) as of 01/08/2019 11:50  Ref. Range 10/11/2018 15:26  Hemoglobin A1C Latest Ref Range: 4.6 - 6.5 % 11.6 (H)   Review of Glycemic Control Results for PRABHNOOR, PIECHOCKI (MRN [id number]) as of 01/10/2019 09:05  Ref. Range 01/09/2019 04:30 01/09/2019 07:50 01/09/2019 12:14 01/09/2019 19:44 01/10/2019 00:07 01/10/2019 03:35 01/10/2019 07:56  Glucose-Capillary Latest Ref Range: 70 - 99 mg/dL 138 (H) 182 (H) 297 (H) 321 (H) 253 (H) 173 (H) 167 (H)   Diabetes history: DM 2 Outpatient Diabetes medications: Metformin 1000 mg bid Current orders for Inpatient glycemic control:  Novolog 0-9 units Q4 hours  Full liquid diet (has quiet a few carbs)  Inpatient Diabetes Program Recommendations:    Glucose trends increase after meals.   Consider Novolog 3-4 units tid meal coverage if pt consumes at least 50% of meals as diet advances.  Consider repeating A1c level to assess glucose control over the past 2-3 months.  Thanks,  Tama Headings RN, MSN, BC-ADM Inpatient Diabetes Coordinator Team Pager [phone] (8a-5p)

## 2019-01-11 LAB — GLUCOSE, CAPILLARY
Glucose-Capillary: 130 mg/dL — ABNORMAL HIGH (ref 70–99)
Glucose-Capillary: 150 mg/dL — ABNORMAL HIGH (ref 70–99)
Glucose-Capillary: 209 mg/dL — ABNORMAL HIGH (ref 70–99)
Glucose-Capillary: 215 mg/dL — ABNORMAL HIGH (ref 70–99)
Glucose-Capillary: 254 mg/dL — ABNORMAL HIGH (ref 70–99)
Glucose-Capillary: 303 mg/dL — ABNORMAL HIGH (ref 70–99)
Glucose-Capillary: 381 mg/dL — ABNORMAL HIGH (ref 70–99)

## 2019-01-11 LAB — COMPREHENSIVE METABOLIC PANEL
ALT: 9 U/L (ref 0–44)
AST: 7 U/L — ABNORMAL LOW (ref 15–41)
Albumin: 2.3 g/dL — ABNORMAL LOW (ref 3.5–5.0)
Alkaline Phosphatase: 79 U/L (ref 38–126)
Anion gap: 6 (ref 5–15)
BUN: 5 mg/dL — ABNORMAL LOW (ref 8–23)
CO2: 20 mmol/L — ABNORMAL LOW (ref 22–32)
Calcium: 7.8 mg/dL — ABNORMAL LOW (ref 8.9–10.3)
Chloride: 109 mmol/L (ref 98–111)
Creatinine, Ser: 0.69 mg/dL (ref 0.44–1.00)
GFR calc Af Amer: >60 mL/min (ref >60)
GFR calc non Af Amer: >60 mL/min (ref >60)
Glucose, Bld: 243 mg/dL — ABNORMAL HIGH (ref 70–99)
Potassium: 3.7 mmol/L (ref 3.5–5.1)
Sodium: 135 mmol/L (ref 135–145)
Total Bilirubin: 0.3 mg/dL (ref 0.3–1.2)
Total Protein: 5.1 g/dL — ABNORMAL LOW (ref 6.5–8.1)

## 2019-01-11 LAB — CBC
HCT: 37.6 % (ref 36.0–46.0)
Hemoglobin: 11.3 g/dL — ABNORMAL LOW (ref 12.0–15.0)
MCH: 25.4 pg — ABNORMAL LOW (ref 26.0–34.0)
MCHC: 30.1 g/dL (ref 30.0–36.0)
MCV: 84.5 fL (ref 80.0–100.0)
Platelets: 285 10*3/uL (ref 150–400)
RBC: 4.45 MIL/uL (ref 3.87–5.11)
RDW: 16.9 % — ABNORMAL HIGH (ref 11.5–15.5)
WBC: 9.4 10*3/uL (ref 4.0–10.5)
nRBC: 0 % (ref 0.0–0.2)

## 2019-01-11 MED ORDER — FLEET ENEMA 7-19 GM/118ML RE ENEM
1.0000 | ENEMA | Freq: Once | RECTAL | Status: AC
  Administered 2019-01-12: 1 via RECTAL
  Filled 2019-01-11: qty 1

## 2019-01-11 MED ORDER — INSULIN DETEMIR 100 UNIT/ML ~~LOC~~ SOLN: 12.0000 [IU] | Freq: Every day | SUBCUTANEOUS | Status: DC

## 2019-01-11 MED ORDER — INSULIN DETEMIR 100 UNIT/ML ~~LOC~~ SOLN
15.0000 [IU] | Freq: Every day | SUBCUTANEOUS | Status: DC
  Administered 2019-01-11 – 2019-01-12 (×2): 15 [IU] via SUBCUTANEOUS
  Filled 2019-01-11 (×2): qty 0.15

## 2019-01-11 MED ORDER — NYSTATIN 100000 UNIT/GM EX CREA
TOPICAL_CREAM | Freq: Two times a day (BID) | CUTANEOUS | Status: DC
  Administered 2019-01-11 – 2019-01-14 (×7): via TOPICAL
  Filled 2019-01-11: qty 15

## 2019-01-11 MED ORDER — INSULIN ASPART 100 UNIT/ML ~~LOC~~ SOLN
3.0000 [IU] | Freq: Three times a day (TID) | SUBCUTANEOUS | Status: DC
  Administered 2019-01-11 – 2019-01-12 (×4): 3 [IU] via SUBCUTANEOUS

## 2019-01-11 MED ORDER — FLUCONAZOLE 150 MG PO TABS
150.0000 mg | ORAL_TABLET | Freq: Once | ORAL | Status: AC
  Administered 2019-01-11: 150 mg via ORAL
  Filled 2019-01-11: qty 1

## 2019-01-11 NOTE — Progress Notes (Signed)
PROGRESS NOTE    Leslie Benton  [id number] DOB: 13-Jan-1941 DOA: 01/05/2019 PCP: [name], MD    Brief Narrative:  78 y.o.female,w anxiety/depression, chronic lower back pain, hypertension, hyperlipidemia, Dm2, Hypothyroidism, hemorrhage of the anus ?, apparently presents with c/o diarrhea for the past week. Liquid stool about 5x per day. Sometimes blood. Pt denies fever, chills, cp, palp, sob, n/v, abd pain, black stool  Assessment & Plan:   Principal Problem:   Diarrhea Active Problems:   Hypothyroidism   Essential hypertension   GERD   Anemia   Tachycardia   Elevated troponin   SVT (supraventricular tachycardia) (HCC)   Diarrhea / Colitis -GI pathogen paneland Cdiff negative, reviewed -celiac panelpending -Pt currently continued on empiricCipro iv, Flagyl iv pharmacy to dose -On further questioning, pt reports several week hx of frequent diarrhea, most recent was earlier today. Five documented stools noted yesterday -Stools are Heme pos. CT abd personally reviewed. Findings of diffuse colonic wall thickening and vascular combing involving the transverse colon to rectum -Given chronic nature of diarrhea, consulted GI. Recommendations for flex sig on 9/25  Blood in stool -Stools are heme pos -hgb stable -recheck cbc in AM  Tachycardiawith elev trop -Trop Ielevated-appreciate cards input -D dimer noted to beelevated with follow up CTA chest neg for PE, reviewed -Cardiology following. C/w amioand metoprolol 25mg  BID -Per Cardiology trop elevation likely due to demand/strain in setting of significant tachycardia and acute GI illness -Stable at this time  Hypertension -Cont Lisinopril 10mg  po qday -Cont Metoprolol 25mg  po 4 times daily as pt tolerates  Dm2 -FSBS q4h, ISS -Holding Amaryl while in office -Have stopped Metformin secondary to diarrhea -Glucose remains in the mid-200's -Started 3 units aspart with meals. Increased  levemir to 15 units  Hypothyroidism Cont Levothyroxine175 micrograms po qday as tolerated   DVT prophylaxis: SCD's Code Status: Full Family Communication: Pt in room, family not at bedside Disposition Plan: SNF when stable  Consultants:   GI  Procedures:     Antimicrobials: Anti-infectives (From admission, onward)   Start     Dose/Rate Route Frequency Ordered Stop   01/11/19 1230  fluconazole (DIFLUCAN) tablet 150 mg     150 mg Oral  Once 01/11/19 1127 01/11/19 1318   01/06/19 1000  ciprofloxacin (CIPRO) IVPB 400 mg     400 mg 200 mL/hr over 60 Minutes Intravenous Every 12 hours 01/06/19 0007     01/06/19 0600  metroNIDAZOLE (FLAGYL) IVPB 500 mg     500 mg 100 mL/hr over 60 Minutes Intravenous Every 8 hours 01/06/19 0007     01/05/19 2215  ciprofloxacin (CIPRO) IVPB 400 mg     400 mg 200 mL/hr over 60 Minutes Intravenous  Once 01/05/19 2200 01/06/19 0031   01/05/19 2215  metroNIDAZOLE (FLAGYL) IVPB 500 mg     500 mg 100 mL/hr over 60 Minutes Intravenous  Once 01/05/19 2200 01/05/19 2321      Subjective: Still reporting loose stools  Objective: Vitals:   01/10/19 2200 01/11/19 0026 01/11/19 0639 01/11/19 1345  BP: 117/60 120/71 (!) 145/56 137/65  Pulse: 78 (!) 59 71 77  Resp: 17  17 18   Temp: 99 F (37.2 C) 98.9 F (37.2 C) 98.6 F (37 C) 98.8 F (37.1 C)  TempSrc: Oral Oral Oral Oral  SpO2: 99% 99% 96% 98%  Weight:      Height:        Intake/Output Summary (Last 24 hours) at 01/11/2019 1856 Last data filed at  01/11/2019 1700 Gross per 24 hour  Intake 1141 ml  Output 0 ml  Net 1141 ml   Filed Weights   01/05/19 1856  Weight: 74.8 kg    Examination: General exam: Awake, laying in bed, in nad Respiratory system: Normal respiratory effort, no wheezing Cardiovascular system: regular rate, s1, s2 Gastrointestinal system: Soft, nondistended, positive BS Central nervous system: CN2-12 grossly intact, strength intact Extremities: Perfused, no  clubbing Skin: Normal skin turgor, no notable skin lesions seen Psychiatry: Mood normal // no visual hallucinations   Data Reviewed: I have personally reviewed following labs and imaging studies  CBC: Recent Labs  Lab 01/05/19 1939 01/07/19 0402 01/08/19 0423 01/11/19 0454  WBC 15.0* 10.3 13.1* 9.4  NEUTROABS  --  6.7 9.8*  --   HGB 13.0 11.5* 11.5* 11.3*  HCT 43.0 38.1 38.9 37.6  MCV 83.3 83.6 85.3 84.5  PLT 407* 326 320 [id number]   Basic Metabolic Panel: Recent Labs  Lab 01/07/19 0402 01/08/19 0423 01/09/19 0434 01/10/19 0415 01/11/19 0454  NA 137 136 135 135 135  K 3.5 3.7 4.0 3.6 3.7  CL 111 108 110 110 109  CO2 19* 18* 18* 19* 20*  GLUCOSE 91 118* 168* 196* 243*  BUN 16 13 9  7* 5*  CREATININE 0.59 0.74 0.75 0.72 0.69  CALCIUM 7.7* 7.7* 7.5* 7.7* 7.8*   GFR: Estimated Creatinine Clearance: 55.3 mL/min (by C-G formula based on SCr of 0.69 mg/dL). Liver Function Tests: Recent Labs  Lab 01/05/19 1939 01/11/19 0454  AST 9* 7*  ALT 12 9  ALKPHOS 118 79  BILITOT 0.4 0.3  PROT 6.6 5.1*  ALBUMIN 3.0* 2.3*   Recent Labs  Lab 01/05/19 1939  LIPASE 27   No results for input(s): AMMONIA in the last 168 hours. Coagulation Profile: No results for input(s): INR, PROTIME in the last 168 hours. Cardiac Enzymes: No results for input(s): CKTOTAL, CKMB, CKMBINDEX, TROPONINI in the last 168 hours. BNP (last 3 results) No results for input(s): PROBNP in the last 8760 hours. HbA1C: No results for input(s): HGBA1C in the last 72 hours. CBG: Recent Labs  Lab 01/11/19 0014 01/11/19 0452 01/11/19 0752 01/11/19 1156 01/11/19 1700  GLUCAP 254* 215* 209* 381* 303*   Lipid Profile: No results for input(s): CHOL, HDL, LDLCALC, TRIG, CHOLHDL, LDLDIRECT in the last 72 hours. Thyroid Function Tests: No results for input(s): TSH, T4TOTAL, FREET4, T3FREE, THYROIDAB in the last 72 hours. Anemia Panel: No results for input(s): VITAMINB12, FOLATE, FERRITIN, TIBC, IRON,  RETICCTPCT in the last 72 hours. Sepsis Labs: No results for input(s): PROCALCITON, LATICACIDVEN in the last 168 hours.  Recent Results (from the past 240 hour(s))  C difficile quick scan w PCR reflex     Status: None   Collection Time: 01/05/19  9:04 PM   Specimen: STOOL  Result Value Ref Range Status   C Diff antigen NEGATIVE NEGATIVE Final   C Diff toxin NEGATIVE NEGATIVE Final   C Diff interpretation No C. difficile detected.  Final    Comment: Performed at [facility], [place] [address] [place], [place] [address]  SARS Coronavirus 2 [facility] order, Performed in [facility] hospital lab) Nasopharyngeal Nasopharyngeal Swab     Status: None   Collection Time: 01/05/19  9:04 PM   Specimen: Nasopharyngeal Swab  Result Value Ref Range Status   SARS Coronavirus 2 NEGATIVE NEGATIVE Final    Comment: (NOTE) If result is NEGATIVE SARS-CoV-2 target nucleic acids are NOT DETECTED. The SARS-CoV-2 RNA  is generally detectable in upper and lower  respiratory specimens during the acute phase of infection. The lowest  concentration of SARS-CoV-2 viral copies this assay can detect is 250  copies / mL. A negative result does not preclude SARS-CoV-2 infection  and should not be used as the sole basis for treatment or other  patient management decisions.  A negative result may occur with  improper specimen collection / handling, submission of specimen other  than nasopharyngeal swab, presence of viral mutation(s) within the  areas targeted by this assay, and inadequate number of viral copies  (<250 copies / mL). A negative result must be combined with clinical  observations, patient history, and epidemiological information. If result is POSITIVE SARS-CoV-2 target nucleic acids are DETECTED. The SARS-CoV-2 RNA is generally detectable in upper and lower  respiratory specimens dur ing the acute phase of infection.  Positive  results are indicative of active infection with  SARS-CoV-2.  Clinical  correlation with patient history and other diagnostic information is  necessary to determine patient infection status.  Positive results do  not rule out bacterial infection or co-infection with other viruses. If result is PRESUMPTIVE POSTIVE SARS-CoV-2 nucleic acids MAY BE PRESENT.   A presumptive positive result was obtained on the submitted specimen  and confirmed on repeat testing.  While 2019 novel coronavirus  (SARS-CoV-2) nucleic acids may be present in the submitted sample  additional confirmatory testing may be necessary for epidemiological  and / or clinical management purposes  to differentiate between  SARS-CoV-2 and other Sarbecovirus currently known to infect humans.  If clinically indicated additional testing with an alternate test  methodology [id number]) is advised. The SARS-CoV-2 RNA is generally  detectable in upper and lower respiratory sp ecimens during the acute  phase of infection. The expected result is Negative. Fact Sheet for Patients:  StrictlyIdeas.no Fact Sheet for Healthcare Providers: BankingDealers.co.za This test is not yet approved or cleared by the [place] FDA and has been authorized for detection and/or diagnosis of SARS-CoV-2 by FDA under an Emergency Use Authorization (EUA).  This EUA will remain in effect (meaning this test can be used) for the duration of the COVID-19 declaration under Section 564(b)(1) of the Act, 21 U.S.C. section 360bbb-3(b)(1), unless the authorization is terminated or revoked sooner. Performed at [facility], [place] [address] [place], [place] [address]      Radiology Studies: No results found.  Scheduled Meds: . amiodarone  200 mg Oral Daily  . busPIRone  15 mg Oral Daily  . diphenhydrAMINE  50 mg Oral Once  . fluticasone  2 spray Each Nare Daily  . insulin aspart  0-9 Units Subcutaneous Q4H  . insulin aspart  3 Units  Subcutaneous TID WC  . insulin detemir  15 Units Subcutaneous QHS  . levothyroxine  175 mcg Oral QAC breakfast  . lisinopril  10 mg Oral Daily  . metoprolol tartrate  25 mg Oral QID  . nystatin cream   Topical BID  . [START ON 01/12/2019] sodium phosphate  1 enema Rectal Once  . [START ON 01/12/2019] sodium phosphate  1 enema Rectal Once   Continuous Infusions: . ciprofloxacin 400 mg (01/11/19 0955)  . metronidazole 500 mg (01/11/19 1320)     LOS: 5 days   Marylu Lund, MD [facility] Pager On Amion  If 7PM-7AM, please contact night-coverage 01/11/2019, 6:56 PM

## 2019-01-11 NOTE — Progress Notes (Signed)
The patient stated that the MD did speak with the patient regarding the Flexible Sigmoidoscopy procedure scheduled tomorrow. The patient was agreeable to signing the informed consent for the flexible sigmoidoscopy, Informed consent is signed.  Will continue to monitor the patient.

## 2019-01-11 NOTE — Progress Notes (Signed)
Inpatient Diabetes Program Recommendations  AACE/ADA: New Consensus Statement on Inpatient Glycemic Control (2015)  Target Ranges:  Prepandial:   less than 140 mg/dL      Peak postprandial:   less than 180 mg/dL (1-2 hours)      Critically ill patients:  140 - 180 mg/dL   Lab Results  Component Value Date   GLUCAP 209 (H) 01/11/2019   HGBA1C 11.6 (H) 10/11/2018   Results for ZYMIA, YACKO (MRN [id number]) as of 01/08/2019 11:50  Ref. Range 10/11/2018 15:26  Hemoglobin A1C Latest Ref Range: 4.6 - 6.5 % 11.6 (H)   Review of Glycemic Control Results for PALMYRA, GANI (MRN [id number]) as of 01/10/2019 09:05  Ref. Range 01/09/2019 04:30 01/09/2019 07:50 01/09/2019 12:14 01/09/2019 19:44 01/10/2019 00:07 01/10/2019 03:35 01/10/2019 07:56  Glucose-Capillary Latest Ref Range: 70 - 99 mg/dL 138 (H) 182 (H) 297 (H) 321 (H) 253 (H) 173 (H) 167 (H)   Diabetes history: DM 2 Outpatient Diabetes medications: Metformin 1000 mg bid Current orders for Inpatient glycemic control:  Novolog 0-9 units Q4 hours  Full liquid diet (has quiet a few carbs)  Inpatient Diabetes Program Recommendations:    Glucose trends increase after meals.   Noted Novolog 3 meal coverage added. Noted Levemir increased from 10 to 12 units.  Renal function WNL.  Based on amount of Novolog received, consider increasing Levemir to 15 units.  Consider repeating A1c level to assess glucose control over the past 2-3 months. Last A1c was in June.  Thanks,  Tama Headings RN, MSN, BC-ADM Inpatient Diabetes Coordinator Team Pager [phone] (8a-5p)

## 2019-01-11 NOTE — NC FL2 (Signed)
[place] LEVEL OF CARE SCREENING TOOL     IDENTIFICATION  Patient Name: Leslie Benton Birthdate: Aug 13, 1940 Sex: female Admission Date (Current Location): 01/05/2019  [facility] and [place] Number:  Herbalist and Address:  [facility],  [place], [place]      Provider Number: [id number]  Attending Physician Name and Address:  Donne Hazel, MD  Relative Name and Phone Number:  Everardo Beals [phone] daughter    Current Level of Care: Hospital Recommended Level of Care: [place] Prior Approval Number:    Date Approved/Denied:   PASRR Number: [id number]  Discharge Plan: SNF    Current Diagnoses: Patient Active Problem List   Diagnosis Date Noted  . SVT (supraventricular tachycardia) ([place])   . Elevated troponin 01/06/2019  . Diarrhea 01/05/2019  . Non-compliant behavior 12/20/2018  . Need for influenza vaccination 12/20/2018  . Post-nasal drip 07/24/2018  . Anxiety 07/04/2018  . Uncontrolled type 2 diabetes mellitus with hyperglycemia ([place]) 06/06/2018  . Chronic fatigue 01/30/2018  . Dyslipidemia 10/17/2017  . Acute eczematoid otitis externa of left ear 07/26/2017  . Fungal dermatitis 07/26/2017  . Cervical spondylosis without myelopathy 05/29/2015  . Cervicogenic headache 05/29/2015  . Obesity 01/14/2015  . OA (osteoarthritis) of knee 09/23/2014  . Precordial chest pain 08/08/2014  . Tachycardia   . Anemia 07/02/2014  . Depression 06/08/2012  . KNEE PAIN, RIGHT 07/15/2008  . COLONIC POLYPS, HX OF 02/07/2008  . LOW BACK PAIN 08/04/2007  . Hypothyroidism 10/14/2006  . Essential hypertension 10/14/2006  . GERD 10/14/2006  . Osteoarthritis 10/14/2006    Orientation RESPIRATION BLADDER Height & Weight     Self, Time, Situation, Place  Normal Incontinent Weight: 74.8 kg Height:  5\' 3"  (160 cm)  BEHAVIORAL SYMPTOMS/MOOD NEUROLOGICAL BOWEL NUTRITION STATUS      Incontinent  Diet(Regular)  AMBULATORY STATUS COMMUNICATION OF NEEDS Skin   Extensive Assist Verbally Other (Comment)(Moisture on buttock no skin break down)                       Personal Care Assistance Level of Assistance  Bathing, Feeding, Dressing Bathing Assistance: Limited assistance Feeding assistance: Independent Dressing Assistance: Limited assistance     Functional Limitations Info  Sight, Hearing, Speech Sight Info: Adequate Hearing Info: Adequate Speech Info: Adequate    SPECIAL CARE FACTORS FREQUENCY                       Contractures Contractures Info: Not present    Additional Factors Info  Allergies, Code Status Code Status Info: FULL Allergies Info: Niacin And Related, Lactose Intolerance (Gi), Amoxicillin, Lactase, Penicillins           Current Medications (01/11/2019):  This is the current hospital active medication list Current Facility-Administered Medications  Medication Dose Route Frequency Provider Last Rate Last Dose  . amiodarone (PACERONE) tablet 200 mg  200 mg Oral Daily Nahser, Wonda Cheng, MD   200 mg at 01/11/19 0955  . busPIRone (BUSPAR) tablet 15 mg  15 mg Oral Daily Jani Gravel, MD   15 mg at 01/11/19 0955  . ciprofloxacin (CIPRO) IVPB 400 mg  400 mg Intravenous Marjean Donna, MD 200 mL/hr at 01/11/19 0955 400 mg at 01/11/19 0955  . diphenhydrAMINE (BENADRYL) capsule 25 mg  25 mg Oral Q4H PRN Schorr, Rhetta Mura, NP      . diphenhydrAMINE (BENADRYL) capsule 50 mg  50 mg Oral  Once Schorr, Rhetta Mura, NP      . fluticasone Asencion Islam) 50 MCG/ACT nasal spray 2 spray  2 spray Each Nare Daily Jani Gravel, MD   2 spray at 01/11/19 [phone]  . insulin aspart (novoLOG) injection 0-9 Units  0-9 Units Subcutaneous Q4H Jani Gravel, MD   9 Units at 01/11/19 1317  . insulin aspart (novoLOG) injection 3 Units  3 Units Subcutaneous TID WC Donne Hazel, MD   3 Units at 01/11/19 1318  . insulin detemir (LEVEMIR) injection 15 Units  15 Units Subcutaneous QHS Donne Hazel, MD      . levothyroxine (SYNTHROID) tablet 175 mcg  175 mcg Oral QAC breakfast Jani Gravel, MD   175 mcg at 01/11/19 [phone]  . lisinopril (ZESTRIL) tablet 10 mg  10 mg Oral Daily Jani Gravel, MD   10 mg at 01/11/19 0955  . loratadine (CLARITIN) tablet 10 mg  10 mg Oral Daily PRN Jani Gravel, MD   10 mg at 01/10/19 [id number]  . mesalamine (CANASA) suppository 1,000 mg  1,000 mg Rectal PRN Jani Gravel, MD      . metoprolol tartrate (LOPRESSOR) tablet 25 mg  25 mg Oral QID Fay Records, MD   25 mg at 01/11/19 1317  . metroNIDAZOLE (FLAGYL) IVPB 500 mg  500 mg Intravenous Lysle Dingwall, MD 100 mL/hr at 01/11/19 1320 500 mg at 01/11/19 1320  . nystatin cream (MYCOSTATIN)   Topical BID Donne Hazel, MD      . ondansetron [facility]) injection 4 mg  4 mg Intravenous Q6H PRN Schorr, Rhetta Mura, NP   4 mg at 01/11/19 1419  . [START ON 01/12/2019] sodium phosphate (FLEET) 7-19 GM/118ML enema 1 enema  1 enema Rectal Once Brahmbhatt, Parag, MD      . Derrill Memo ON 01/12/2019] sodium phosphate (FLEET) 7-19 GM/118ML enema 1 enema  1 enema Rectal Once Brahmbhatt, Parag, MD         Discharge Medications: Please see discharge summary for a list of discharge medications.  Relevant Imaging Results:  Relevant Lab Results:   Additional Information [id number]  Purcell Mouton, RN

## 2019-01-11 NOTE — Consult Note (Signed)
Referring Provider:  [facility] Primary Care Physician:  [name], MD Primary Gastroenterologist:  Dr. Cristina Gong  Reason for Consultation: Diarrhea, colitis  HPI: Leslie Benton is a 78 y.o. female admitted to the hospital on January 05, 2019 with diarrhea.  GI pathogen panel and C. difficile negative.  CT scan showed diffuse colitis from transverse colon to rectum.  Occult blood positive.  Because of ongoing symptoms, GI is consulted for further evaluation.  Patient initially had mild elevated troponins with cardiac work-up negative for acute coronary syndrome.  Patient seen and examined at bedside.  Patient is a poor historian.  She is complaining of diarrhea but denies any associated abdominal pain or bleeding.  Denies nausea or vomiting.    Colonoscopy in 2018 by Dr. Cristina Gong showed diverticulosis and mild inflammation in the rectum.  She was seen by Dr. Cristina Gong earlier this month for her proctitis and diarrhea.  She is currently on Canasa suppositories.  Past Medical History:  Diagnosis Date  . Anemia   . Anxiety   . Cervical spondylosis without myelopathy 05/29/2015  . COLONIC POLYPS, HX OF 02/07/2008  . DEGENERATIVE JOINT DISEASE 10/14/2006   03/31/11: Pt states arthritis in her hip.  . Depression   . DIABETES MELLITUS, TYPE II 10/27/2009  . GERD 10/14/2006  . HYPERLIPIDEMIA 10/14/2006  . HYPERTENSION 10/14/2006   off bp meds now  . HYPOTHYROIDISM 10/14/2006  . LOW BACK PAIN 08/04/2007  . Obesity     Past Surgical History:  Procedure Laterality Date  . HEMORRHOID SURGERY    . Left knee arthroscopic  April 2014   Dr. Durward Fortes  . PARTIAL KNEE ARTHROPLASTY Left 09/23/2014   Procedure: LEFT UNICOMPARTMENTAL KNEE;  Surgeon: [name], MD;  Location: [facility];  Service: Orthopedics;  Laterality: Left;  . TONSILLECTOMY    . TUBAL LIGATION      Prior to Admission medications   Medication Sig Start Date End Date Taking? Authorizing Provider  busPIRone (BUSPAR) 15 MG tablet  Take 1 tablet (15 mg total) by mouth daily. Patient taking differently: Take 15 mg by mouth daily as needed (depression).  07/04/18  Yes [name], MD  fluticasone (FLONASE) 50 MCG/ACT nasal spray SHAKE LIQUID AND USE 2 SPRAYS IN EACH NOSTRIL DAILY Patient taking differently: Place 2 sprays into both nostrils daily.  10/19/18  Yes [name], MD  ibuprofen (ADVIL,MOTRIN) 200 MG tablet Take 400 mg by mouth as needed for headache or moderate pain.    Yes [provider]  levothyroxine (SYNTHROID) 175 MCG tablet Take 1 tablet (175 mcg total) by mouth daily before breakfast. 10/16/18  Yes [name], MD  lisinopril (ZESTRIL) 10 MG tablet Take 1 tablet (10 mg total) by mouth daily. 12/19/18  Yes [name], MD  loratadine (CLARITIN) 10 MG tablet Take 10 mg by mouth daily as needed for allergies or rhinitis.    Yes [provider]  mesalamine (CANASA) 1000 MG suppository Place 1,000 mg rectally as needed (diarrhea).  12/28/17  Yes [provider]  metFORMIN (GLUCOPHAGE) 1000 MG tablet Take 1 tablet (1,000 mg total) by mouth 2 (two) times daily with a meal. 07/04/18  Yes [name], MD  phentermine 37.5 MG capsule Take 1 capsule (37.5 mg total) by mouth as needed. 10/26/18  Yes [name], MD  propranolol (INDERAL) 20 MG tablet Take 1 tablet (20 mg total) by mouth 2 (two) times daily. Patient taking differently: Take 20 mg by mouth daily as  needed (hypertension).  11/21/18  Yes [name], MD  Blood Glucose Monitoring Suppl (ONE TOUCH ULTRA 2) w/Device KIT Use to test blood sugar 1-2 times daily. 10/16/18   [name], MD  glimepiride (AMARYL) 1 MG tablet Take 1 tablet (1 mg total) by mouth daily with breakfast. Patient not taking: Reported on 01/05/2019 06/20/18   Renato Shin, MD  glucose blood (ONETOUCH ULTRA) test strip Use to test blood sugars 1-2 times daily. 10/16/18   [name],  MD  Lancets [facility] ULTRASOFT) lancets Use to test blood sugars 1-2 times daily. 10/16/18   [name], MD    Scheduled Meds: . amiodarone  200 mg Oral Daily  . busPIRone  15 mg Oral Daily  . diphenhydrAMINE  50 mg Oral Once  . fluticasone  2 spray Each Nare Daily  . insulin aspart  0-9 Units Subcutaneous Q4H  . insulin aspart  3 Units Subcutaneous TID WC  . insulin detemir  12 Units Subcutaneous QHS  . levothyroxine  175 mcg Oral QAC breakfast  . lisinopril  10 mg Oral Daily  . metoprolol tartrate  25 mg Oral QID   Continuous Infusions: . ciprofloxacin 400 mg (01/11/19 0955)  . metronidazole 500 mg (01/11/19 0618)   PRN Meds:.diphenhydrAMINE, loratadine, mesalamine, ondansetron (ZOFRAN) IV  Allergies as of 01/05/2019 - Review Complete 01/05/2019  Allergen Reaction Noted  . Niacin and related Hives and Swelling 12/02/2010  . Lactose intolerance (gi)  12/02/2010  . Amoxicillin Swelling 10/14/2006  . Lactase  05/09/2013  . Penicillins Swelling 01/14/2011    Family History  Problem Relation Age of Onset  . Heart attack Father 18  . Diabetes Father   . Kidney failure Mother   . Cancer Sister        endometrial  . Cancer Brother        Adrenal    Social History   Socioeconomic History  . Marital status: Married    Spouse name: Not on file  . Number of children: 2  . Years of education: college  . Highest education level: Not on file  Occupational History  . Occupation: retired  Scientific laboratory technician  . Financial resource strain: Not on file  . Food insecurity    Worry: Not on file    Inability: Not on file  . Transportation needs    Medical: Not on file    Non-medical: Not on file  Tobacco Use  . Smoking status: Never Smoker  . Smokeless tobacco: Never Used  . Tobacco comment: Never Used Tobacco  Substance and Sexual Activity  . Alcohol use: No    Alcohol/week: 0.0 standard drinks  . Drug use: No  . Sexual activity: Not on file  Lifestyle  .  Physical activity    Days per week: Not on file    Minutes per session: Not on file  . Stress: Not on file  Relationships  . Social Herbalist on phone: Not on file    Gets together: Not on file    Attends religious service: Not on file    Active member of club or organization: Not on file    Attends meetings of clubs or organizations: Not on file    Relationship status: Not on file  . Intimate partner violence    Fear of current or ex partner: Not on file    Emotionally abused: Not on file    Physically abused: Not on file    Forced  sexual activity: Not on file  Other Topics Concern  . Not on file  Social History Narrative   Lives with husband and son.     Patient drinks 1-2 cups of caffeine daily.   Patient is left handed.     Review of Systems: limited ROS  negative except as stated above in HPI.  Physical Exam: Vital signs: Vitals:   01/11/19 0026 01/11/19 0639  BP: 120/71 (!) 145/56  Pulse: (!) 59 71  Resp:  17  Temp: 98.9 F (37.2 C) 98.6 F (37 C)  SpO2: 99% 96%   Last BM Date: 01/10/19 Physical Exam  Constitutional: She appears well-developed and well-nourished. No distress.  HENT:  Head: Normocephalic and atraumatic.  Mouth/Throat: No oropharyngeal exudate.  Eyes: EOM are normal. No scleral icterus.  Neck: Normal range of motion. Neck supple.  Cardiovascular: Normal rate and normal heart sounds.  Pulmonary/Chest: Effort normal. No respiratory distress.  Abdominal: Soft. Bowel sounds are normal. She exhibits no distension. There is no abdominal tenderness. There is no rebound and no guarding.  Musculoskeletal: Normal range of motion.        General: No edema.  Neurological: She is alert.  Skin: Skin is warm. No erythema.  Psychiatric: She has a normal mood and affect. Judgment and thought content normal.  Vitals reviewed.   GI:  Lab Results: Recent Labs    01/11/19 0454  WBC 9.4  HGB 11.3*  HCT 37.6  PLT 285   BMET Recent Labs     01/09/19 0434 01/10/19 0415 01/11/19 0454  NA 135 135 135  K 4.0 3.6 3.7  CL 110 110 109  CO2 18* 19* 20*  GLUCOSE 168* 196* 243*  BUN 9 7* 5*  CREATININE 0.75 0.72 0.69  CALCIUM 7.5* 7.7* 7.8*   LFT Recent Labs    01/11/19 0454  PROT 5.1*  ALBUMIN 2.3*  AST 7*  ALT 9  ALKPHOS 79  BILITOT 0.3   PT/INR No results for input(s): LABPROT, INR in the last 72 hours.   Studies/Results: No results found.  Impression/Plan: -Diarrhea with CT scan showing colitis from transverse colon to rectum.  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  Possible ulcerative colitis.  GI pathogen panel negative.  Diarrhea not improving with antibiotics. -Anemia.  Occult blood positive stool.  Probably from colitis.  Recommendations -------------------------- -Patient does not want to have a repeat colonoscopy done but she is agreeable for flexible sigmoidoscopy.  We will plan for flexible sigmoidoscopy tomorrow.  Okay to have full liquid diet today.  Meanwhile continue current management.    -Called and discussed with patient's husband over the phone.  Consent for flex sig obtained over the phone.  GI will follow.  - Risks (bleeding, infection, bowel perforation that could require surgery, sedation-related changes in cardiopulmonary systems), benefits (identification and possible treatment of source of symptoms, exclusion of certain causes of symptoms), and alternatives (watchful waiting, radiographic imaging studies, empiric medical treatment)  were explained to patient and family in detail and patient wishes to proceed.    LOS: 5 days   Otis Brace  MD, FACP 01/11/2019, 11:00 AM  Contact #  [phone]

## 2019-01-11 NOTE — Progress Notes (Signed)
RN notified the office of Dr Otis Brace. RN notified the answering service that the patient wants a colonoscopy not a flexible sigmoidoscopy. RN waiting for a call back from on-call MD.

## 2019-01-11 NOTE — TOC Progression Note (Signed)
Transition of Care [facility]) - Progression Note    Patient Details  Name: Leslie Benton MRN: [id number] Date of Birth: 28-Apr-1940  Transition of Care [facility]) CM/SW Contact  Purcell Mouton, RN Phone Number: 01/11/2019, 2:53 PM  Clinical Narrative:    Spoke with pt who referred this SM to her daughter, that whatever her daughter say she will do. Pt's daughter Lupita Dawn [phone] called, when asked about pt going to SNF. Roxanne stated absolutely yes, she need to go to Rehab.         Expected Discharge Plan and Services                                                 Social Determinants of Health (SDOH) Interventions    Readmission Risk Interventions No flowsheet data found.

## 2019-01-11 NOTE — Progress Notes (Signed)
Patient called RN to the room. RN spoke with the daughter (on the phone) and the patient and it was decided by the patient that she should have a colonoscopy. RN will notify the MD.

## 2019-01-11 NOTE — Plan of Care (Signed)
  Problem: Education: Goal: Knowledge of General Education information will improve Description: Including pain rating scale, medication(s)/side effects and non-pharmacologic comfort measures Outcome: Progressing   Problem: Activity: Goal: Risk for activity intolerance will decrease Outcome: Progressing   

## 2019-01-11 NOTE — Progress Notes (Signed)
RN spoke with  Dr  Alessandra Bevels on the phone.  The MD with speak with the patient in the morning about a possible colonoscopy.

## 2019-01-12 ENCOUNTER — Encounter (HOSPITAL_COMMUNITY): Payer: Self-pay | Admitting: *Deleted

## 2019-01-12 ENCOUNTER — Encounter (HOSPITAL_COMMUNITY)
Admission: EM | Disposition: A | Discharge status: Skilled Nursing Facility | Payer: Self-pay | Source: Home / Self Care | Attending: Internal Medicine

## 2019-01-12 HISTORY — PX: BIOPSY: SHX5522

## 2019-01-12 HISTORY — PX: FLEXIBLE SIGMOIDOSCOPY: SHX5431

## 2019-01-12 LAB — GLUCOSE, RANDOM: Glucose, Bld: 573 mg/dL (ref 70–99)

## 2019-01-12 LAB — GLUCOSE, CAPILLARY
Glucose-Capillary: 156 mg/dL — ABNORMAL HIGH (ref 70–99)
Glucose-Capillary: 149 mg/dL — ABNORMAL HIGH (ref 70–99)
Glucose-Capillary: 338 mg/dL — ABNORMAL HIGH (ref 70–99)
Glucose-Capillary: 513 mg/dL (ref 70–99)
Glucose-Capillary: 522 mg/dL (ref 70–99)

## 2019-01-12 SURGERY — SIGMOIDOSCOPY, FLEXIBLE
Anesthesia: Moderate Sedation

## 2019-01-12 MED ORDER — FENTANYL CITRATE (PF) 100 MCG/2ML IJ SOLN
INTRAMUSCULAR | Status: AC
  Filled 2019-01-12: qty 2

## 2019-01-12 MED ORDER — MIDAZOLAM HCL (PF) 10 MG/2ML IJ SOLN
INTRAMUSCULAR | Status: DC | PRN
  Administered 2019-01-12: 2 mg via INTRAVENOUS

## 2019-01-12 MED ORDER — MIDAZOLAM HCL (PF) 5 MG/ML IJ SOLN
INTRAMUSCULAR | Status: AC
  Filled 2019-01-12: qty 2

## 2019-01-12 MED ORDER — INSULIN ASPART 100 UNIT/ML ~~LOC~~ SOLN
12.0000 [IU] | Freq: Once | SUBCUTANEOUS | Status: AC
  Administered 2019-01-12: 12 [IU] via SUBCUTANEOUS

## 2019-01-12 MED ORDER — FENTANYL CITRATE (PF) 100 MCG/2ML IJ SOLN
INTRAMUSCULAR | Status: DC | PRN
  Administered 2019-01-12: 25 ug via INTRAVENOUS

## 2019-01-12 MED ORDER — MESALAMINE ER 250 MG PO CPCR
1000.0000 mg | ORAL_CAPSULE | Freq: Four times a day (QID) | ORAL | Status: DC
  Administered 2019-01-12 – 2019-01-14 (×8): 1000 mg via ORAL
  Filled 2019-01-12 (×11): qty 4

## 2019-01-12 MED ORDER — METHYLPREDNISOLONE SODIUM SUCC 40 MG IJ SOLR
40.0000 mg | Freq: Two times a day (BID) | INTRAMUSCULAR | Status: DC
  Administered 2019-01-12 – 2019-01-13 (×3): 40 mg via INTRAVENOUS
  Filled 2019-01-12 (×3): qty 1

## 2019-01-12 NOTE — Progress Notes (Signed)
[facility] Progress Note  Leslie Benton 78 y.o. 26-Oct-1940  CC: Colitis, diarrhea   Subjective: Patient continues to have abdominal pain and diarrhea.  Feeling weak.     Objective: Vital signs in last 24 hours: Vitals:   01/11/19 2228 01/12/19 0459  BP: (!) 115/53 (!) 125/51  Pulse: 78 64  Resp:  14  Temp:  98.5 F (36.9 C)  SpO2:  95%    Physical Exam:  General.  Elderly patient.  Not in acute distress Abdomen.  Left lower quadrant tenderness, soft, bowel sounds present, no peritoneal signs  Lab Results: Recent Labs    01/10/19 0415 01/11/19 0454  NA 135 135  K 3.6 3.7  CL 110 109  CO2 19* 20*  GLUCOSE 196* 243*  BUN 7* 5*  CREATININE 0.72 0.69  CALCIUM 7.7* 7.8*   Recent Labs    01/11/19 0454  AST 7*  ALT 9  ALKPHOS 79  BILITOT 0.3  PROT 5.1*  ALBUMIN 2.3*   Recent Labs    01/11/19 0454  WBC 9.4  HGB 11.3*  HCT 37.6  MCV 84.5  PLT 285   No results for input(s): LABPROT, INR in the last 72 hours.    Assessment/Plan: -Diarrhea with CT scan showing colitis from transverse colon to rectum.  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  Possible ulcerative colitis.  GI pathogen panel negative.  Diarrhea not improving with antibiotics. -Anemia.  Occult blood positive stool.  Probably from colitis.  Recommendations -------------------------- -Long discussion with multiple family members today.  Discussed with patient's daughter, patient's son and patient's husband.  Daughter wanted colonoscopy to get the detailed information.  Patient does not want colonoscopy and I think she is feeling weak to have Pap done at this time. -After prolonged discussion, we agreed the best course of action is  to follow with flexible sigmoidoscopy today. -I was called and discussed with Dr. Cristina Gong per patient's request.  He was in agreement.  Risks (bleeding, infection, bowel perforation that could require surgery, sedation-related  changes in cardiopulmonary systems), benefits (identification and possible treatment of source of symptoms, exclusion of certain causes of symptoms), and alternatives (watchful waiting, radiographic imaging studies, empiric medical treatment)  were explained to patient and family in detail and patient wishes to proceed.  Approximately 35 minutes spent in patient encounter with more than 50% time spent in consultation and coordination.  Otis Brace MD, [place] 01/12/2019, 9:15 AM  Contact #  [phone]

## 2019-01-12 NOTE — Progress Notes (Signed)
PROGRESS NOTE    Leslie Benton  [id number] DOB: [birth year] DOA: 01/05/2019 PCP: [name], MD    Brief Narrative:  78 y.o.female,w anxiety/depression, chronic lower back pain, hypertension, hyperlipidemia, Dm2, Hypothyroidism, hemorrhage of the anus ?, apparently presents with c/o diarrhea for the past week. Liquid stool about 5x per day. Sometimes blood. 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 Pt now s/p flex sig with findings concerning for ulcerative colitis -GI recommendations for IV solumedrol q12hrs, Pentasa with repeat colon in 3-6 months -Pt was initially on empiric cipro and flagyl, since d/c'd 9/25  Blood in stool -Stools are heme pos -hgb remain stable -repeat cbc in AM  Tachycardiawith elev trop -Trop Ielevated-appreciate cards input -D dimer noted to beelevated with follow up CTA chest neg for PE, reviewed -Cardiology following. C/w amioand metoprolol 25mg  BID -Per Cardiology trop elevation likely due to demand/strain in setting of significant tachycardia and acute GI illness -Presently stable  Hypertension -Cont Lisinopril 10mg  po qday -Cont Metoprolol 25mg  po 4 times daily as pt tolerates  Dm2 -FSBS q4h, ISS -Holding Amaryl while in office -Have stopped Metformin secondary to diarrhea -Glucose remains in the mid-200's -Currently on 3 units aspart with meals and levemir to 15 units  Hypothyroidism Cont Levothyroxine175 micrograms po qday as tolerated   DVT prophylaxis: SCD's Code Status: Full Family Communication: Pt in room, family not at bedside Disposition Plan:  SNF when stable  Consultants:   GI  Procedures:     Antimicrobials: Anti-infectives (From admission, onward)   Start     Dose/Rate Route Frequency Ordered Stop   01/11/19 1230  fluconazole (DIFLUCAN) tablet 150 mg     150 mg Oral  Once 01/11/19 1127 01/11/19 1318   01/06/19 1000  ciprofloxacin (CIPRO) IVPB 400 mg  Status:  Discontinued     400 mg 200 mL/hr over 60 Minutes Intravenous Every 12 hours 01/06/19 0007 01/12/19 1213   01/06/19 0600  metroNIDAZOLE (FLAGYL) IVPB 500 mg  Status:  Discontinued     500 mg 100 mL/hr over 60 Minutes Intravenous Every 8 hours 01/06/19 0007 01/12/19 1213   01/05/19 2215  ciprofloxacin (CIPRO) IVPB 400 mg     400 mg 200 mL/hr over 60 Minutes Intravenous  Once 01/05/19 2200 01/06/19 0031   01/05/19 2215  metroNIDAZOLE (FLAGYL) IVPB 500 mg     500 mg 100 mL/hr over 60 Minutes Intravenous  Once 01/05/19 2200 01/05/19 2321      Subjective: Without complaints this AM  Objective: Vitals:   01/12/19 1220 01/12/19 1225 01/12/19 1251 01/12/19 1431  BP: (!) 114/42 (!) 124/28 (!) 148/64 130/60  Pulse: (!) 58 (!) 56 (!) 58 77  Resp: 14 15 20 18   Temp:   98.1 F (36.7 C) 98 F (36.7 C)  TempSrc:   Oral Oral  SpO2: 94% 94% 99% 100%  Weight:      Height:        Intake/Output Summary (Last 24 hours) at 01/12/2019 1618 Last data filed at 01/12/2019 1300 Gross per 24 hour  Intake 480 ml  Output -  Net 480 ml   [employer]  01/05/19 1856 01/12/19 1111  Weight: 74.8 kg 74.8 kg    Examination: General exam: Conversant, in no acute distress Respiratory system: normal chest rise, clear, no audible wheezing Cardiovascular system: regular rhythm, s1-s2 Gastrointestinal system: Nondistended, nontender, pos BS Central nervous system: No seizures, no tremors Extremities: No cyanosis, no joint deformities Skin: No rashes, no pallor Psychiatry: Affect normal // no auditory hallucinations   Data Reviewed: I have personally reviewed following  labs and imaging studies  CBC: Recent Labs  Lab 01/05/19 1939 01/07/19 0402 01/08/19 0423 01/11/19 0454  WBC 15.0* 10.3 13.1* 9.4  NEUTROABS  --  6.7 9.8*  --   HGB 13.0 11.5* 11.5* 11.3*  HCT 43.0 38.1 38.9 37.6  MCV 83.3 83.6 85.3 84.5  PLT 407* 326 320 [id number]   Basic Metabolic Panel: Recent Labs  Lab 01/07/19 0402 01/08/19 0423 01/09/19 0434 01/10/19 0415 01/11/19 0454  NA 137 136 135 135 135  K 3.5 3.7 4.0 3.6 3.7  CL 111 108 110 110 109  CO2 19* 18* 18* 19* 20*  GLUCOSE 91 118* 168* 196* 243*  BUN 16 13 9  7* 5*  CREATININE 0.59 0.74 0.75 0.72 0.69  CALCIUM 7.7* 7.7* 7.5* 7.7* 7.8*   GFR: Estimated Creatinine Clearance: 55.3 mL/min (by C-G formula based on SCr of 0.69 mg/dL). Liver Function Tests: Recent Labs  Lab 01/05/19 1939 01/11/19 0454  AST 9* 7*  ALT 12 9  ALKPHOS 118 79  BILITOT 0.4 0.3  PROT 6.6 5.1*  ALBUMIN 3.0* 2.3*   Recent Labs  Lab 01/05/19 1939  LIPASE 27   No results for input(s): AMMONIA in the last 168 hours. Coagulation Profile: No results for input(s): INR, PROTIME in the last 168 hours. Cardiac Enzymes: No results for input(s): CKTOTAL, CKMB, CKMBINDEX, TROPONINI in the last 168 hours. BNP (last 3 results) No results for input(s): PROBNP in the last 8760 hours. HbA1C: No results for input(s): HGBA1C in the last 72 hours. CBG: Recent Labs  Lab 01/11/19 1700 01/11/19 2043 01/11/19 2358 01/12/19 0422 01/12/19 0754  GLUCAP 303* 150* 130* 156* 149*   Lipid Profile: No results for input(s): CHOL, HDL, LDLCALC, TRIG, CHOLHDL, LDLDIRECT in the last 72 hours. Thyroid Function Tests: No results for input(s): TSH, T4TOTAL, FREET4, T3FREE, THYROIDAB in the last 72 hours. Anemia Panel: No results for input(s): VITAMINB12, FOLATE, FERRITIN, TIBC, IRON, RETICCTPCT in the last 72 hours. Sepsis Labs: No results for input(s): PROCALCITON, LATICACIDVEN in the last 168 hours.  Recent Results (from the past 240 hour(s))  C difficile  quick scan w PCR reflex     Status: None   Collection Time: 01/05/19  9:04 PM   Specimen: STOOL  Result Value Ref Range Status   C Diff antigen NEGATIVE NEGATIVE Final   C Diff toxin NEGATIVE NEGATIVE Final   C Diff interpretation No C. difficile detected.  Final    Comment: Performed at [facility], [place] [address] [place], [place] [address]  SARS Coronavirus 2 [facility] order, Performed in [facility] hospital lab) Nasopharyngeal Nasopharyngeal Swab     Status: None   Collection Time: 01/05/19  9:04 PM   Specimen: Nasopharyngeal Swab  Result Value Ref Range Status   SARS Coronavirus 2 NEGATIVE NEGATIVE Final    Comment: (NOTE) If result is NEGATIVE SARS-CoV-2 target nucleic acids are NOT DETECTED. The SARS-CoV-2 RNA is generally detectable in upper and lower  respiratory specimens during the acute phase of infection. The lowest  concentration of SARS-CoV-2 viral  copies this assay can detect is 250  copies / mL. A negative result does not preclude SARS-CoV-2 infection  and should not be used as the sole basis for treatment or other  patient management decisions.  A negative result may occur with  improper specimen collection / handling, submission of specimen other  than nasopharyngeal swab, presence of viral mutation(s) within the  areas targeted by this assay, and inadequate number of viral copies  (<250 copies / mL). A negative result must be combined with clinical  observations, patient history, and epidemiological information. If result is POSITIVE SARS-CoV-2 target nucleic acids are DETECTED. The SARS-CoV-2 RNA is generally detectable in upper and lower  respiratory specimens dur ing the acute phase of infection.  Positive  results are indicative of active infection with SARS-CoV-2.  Clinical  correlation with patient history and other diagnostic information is  necessary to determine patient infection status.  Positive results do  not rule out bacterial  infection or co-infection with other viruses. If result is PRESUMPTIVE POSTIVE SARS-CoV-2 nucleic acids MAY BE PRESENT.   A presumptive positive result was obtained on the submitted specimen  and confirmed on repeat testing.  While 2019 novel coronavirus  (SARS-CoV-2) nucleic acids may be present in the submitted sample  additional confirmatory testing may be necessary for epidemiological  and / or clinical management purposes  to differentiate between  SARS-CoV-2 and other Sarbecovirus currently known to infect humans.  If clinically indicated additional testing with an alternate test  methodology [phone]) is advised. The SARS-CoV-2 RNA is generally  detectable in upper and lower respiratory sp ecimens during the acute  phase of infection. The expected result is Negative. Fact Sheet for Patients:  StrictlyIdeas.no Fact Sheet for Healthcare Providers: BankingDealers.co.za This test is not yet approved or cleared by the [place] FDA and has been authorized for detection and/or diagnosis of SARS-CoV-2 by FDA under an Emergency Use Authorization (EUA).  This EUA will remain in effect (meaning this test can be used) for the duration of the COVID-19 declaration under Section 564(b)(1) of the Act, 21 U.S.C. section 360bbb-3(b)(1), unless the authorization is terminated or revoked sooner. Performed at [facility], [place] [address] [place], [place] [address]      Radiology Studies: No results found.  Scheduled Meds: . amiodarone  200 mg Oral Daily  . busPIRone  15 mg Oral Daily  . diphenhydrAMINE  50 mg Oral Once  . fluticasone  2 spray Each Nare Daily  . insulin aspart  0-9 Units Subcutaneous Q4H  . insulin aspart  3 Units Subcutaneous TID WC  . insulin detemir  15 Units Subcutaneous QHS  . levothyroxine  175 mcg Oral QAC breakfast  . lisinopril  10 mg Oral Daily  . mesalamine  1,000 mg Oral QID  .  methylPREDNISolone (SOLU-MEDROL) injection  40 mg Intravenous Q12H  . metoprolol tartrate  25 mg Oral QID  . nystatin cream   Topical BID   Continuous Infusions:    LOS: 6 days   Marylu Lund, MD [facility] Pager On Amion  If 7PM-7AM, please contact night-coverage 01/12/2019, 4:18 PM

## 2019-01-12 NOTE — Op Note (Signed)
[facility] Patient Name: Leslie Benton Procedure Date: 01/12/2019 MRN: [id number] Attending MD: Otis Brace , MD Date of Birth: 10/23/1940 CSN: [id number] Age: 78 Admit Type: Inpatient Procedure:                Flexible Sigmoidoscopy Indications:              Diarrhea, Abnormal CT of the GI tract, Suspected                            left-sided chronic ulcerative colitis Providers:                Otis Brace, MD, Burtis Junes, RN, Cletis Athens,                            Technician Referring MD:              Medicines:                Fentanyl 25 micrograms IV, Midazolam 2 mg IV Complications:            No immediate complications. Estimated Blood Loss:     Estimated blood loss was minimal. Procedure:                Pre-Anesthesia Assessment:                           - Prior to the procedure, a History and Physical                            was performed, and patient medications and                            allergies were reviewed. The patient's tolerance of                            previous anesthesia was also reviewed. The risks                            and benefits of the procedure and the sedation                            options and risks were discussed with the patient.                            All questions were answered, and informed consent                            was obtained. Prior Anticoagulants: The patient has                            taken no previous anticoagulant or antiplatelet                            agents. ASA Grade Assessment: II - A patient with  mild systemic disease. After reviewing the risks                            and benefits, the patient was deemed in                            satisfactory condition to undergo the procedure.                           After obtaining informed consent, the scope was                            passed under direct vision. The [id number]-[id number]          [id number]) Olympus pediatric colonscope was                            introduced through the anus and advanced to the the                            left transverse colon. The flexible sigmoidoscopy                            was accomplished without difficulty. The patient                            tolerated the procedure well. The quality of the                            bowel preparation was fair. Scope In: Scope Out: Findings:      Skin tags were found on perianal exam.      Inflammation was found in a continuous and circumferential pattern from       the rectum to the transverse colon. This was graded as Mayo Score 2       (moderate, with marked erythema, absent vascular pattern, friability,       erosions), and when compared to the previous examination, the findings       are new. Biopsies were taken with a cold forceps for histology. Biopsies       were taken with a cold forceps for histology.      Internal hemorrhoids were found during retroflexion. The hemorrhoids       were medium-sized. Impression:               - Preparation of the colon was fair.                           - Perianal skin tags found on perianal exam.                           - Moderately active (Mayo Score 2) ulcerative                            colitis, new since the last examination. Biopsied.                           -  Internal hemorrhoids. Moderate Sedation:      Moderate (conscious) sedation was administered by the endoscopy nurse       and supervised by the endoscopist. The following parameters were       monitored: oxygen saturation, heart rate, blood pressure, and response       to care. Recommendation:           - Return patient to hospital ward for ongoing care.                           - Soft diet.                           - Repeat colonoscopy PRN to assess disease activity. Procedure Code(s):        --- Professional ---                           [id number], Sigmoidoscopy, flexible;  with biopsy, single                            or multiple Diagnosis Code(s):        --- Professional ---                           K64.8, Other hemorrhoids                           K51.90, Ulcerative colitis, unspecified, without                            complications                           K64.4, Residual hemorrhoidal skin tags                           R19.7, Diarrhea, unspecified                           R93.3, Abnormal findings on diagnostic imaging of                            other parts of digestive tract CPT copyright 2019 American Medical Association. All rights reserved. The codes documented in this report are preliminary and upon coder review may  be revised to meet current compliance requirements. Otis Brace, MD Otis Brace, MD 01/12/2019 12:12:27 PM Number of Addenda: 0

## 2019-01-12 NOTE — Brief Op Note (Signed)
01/05/2019 - 01/12/2019  12:14 PM  PATIENT:  Leslie Benton  78 y.o. female  PRE-OPERATIVE DIAGNOSIS:  Colitis  POST-OPERATIVE DIAGNOSIS:  colitis, biopsies done  PROCEDURE:  Procedure(s): FLEXIBLE SIGMOIDOSCOPY (N/A) BIOPSY  SURGEON:  Surgeon(s) and Role:    * Samier Jaco, MD - Primary  Findings ---------- -Flexible sigmoidoscopy showed diffuse inflammation extending from rectum all the way up to transverse colon.  Finding concerning for moderate ulcerative colitis.  Biopsies taken  Recommendations ------------------------- -DC antibiotics -Start Solu-Medrol 40 mg IV every 12 hours -Start Pentasa -Consider repeat colonoscopy in 3 to 6 months to determine full extent of her colitis. -GI will follow. - D/W family.  Otis Brace MD, [place] 01/12/2019, 12:55 PM  Contact #  [phone]

## 2019-01-12 NOTE — Care Management Important Message (Signed)
Important Message  Patient Details IM Letter given to [name] RN to present to the Patient Name: Leslie Benton MRN: [id number] Date of Birth: 03/30/1941   Medicare Important Message Given:  Yes     Kerin Salen 01/12/2019, 11:22 AM

## 2019-01-13 LAB — CBC
HCT: 38 % (ref 36.0–46.0)
Hemoglobin: 11.2 g/dL — ABNORMAL LOW (ref 12.0–15.0)
MCH: 25.2 pg — ABNORMAL LOW (ref 26.0–34.0)
MCHC: 29.5 g/dL — ABNORMAL LOW (ref 30.0–36.0)
MCV: 85.6 fL (ref 80.0–100.0)
Platelets: 308 10*3/uL (ref 150–400)
RBC: 4.44 MIL/uL (ref 3.87–5.11)
RDW: 17.4 % — ABNORMAL HIGH (ref 11.5–15.5)
WBC: 7.5 10*3/uL (ref 4.0–10.5)
nRBC: 0 % (ref 0.0–0.2)

## 2019-01-13 LAB — COMPREHENSIVE METABOLIC PANEL
ALT: 11 U/L (ref 0–44)
AST: 15 U/L (ref 15–41)
Albumin: 2.2 g/dL — ABNORMAL LOW (ref 3.5–5.0)
Alkaline Phosphatase: 100 U/L (ref 38–126)
Anion gap: 7 (ref 5–15)
BUN: 8 mg/dL (ref 8–23)
CO2: 20 mmol/L — ABNORMAL LOW (ref 22–32)
Calcium: 7.7 mg/dL — ABNORMAL LOW (ref 8.9–10.3)
Chloride: 111 mmol/L (ref 98–111)
Creatinine, Ser: 0.78 mg/dL (ref 0.44–1.00)
GFR calc Af Amer: >60 mL/min (ref >60)
GFR calc non Af Amer: >60 mL/min (ref >60)
Glucose, Bld: 258 mg/dL — ABNORMAL HIGH (ref 70–99)
Potassium: 4.3 mmol/L (ref 3.5–5.1)
Sodium: 138 mmol/L (ref 135–145)
Total Bilirubin: 0.3 mg/dL (ref 0.3–1.2)
Total Protein: 5.2 g/dL — ABNORMAL LOW (ref 6.5–8.1)

## 2019-01-13 LAB — GLUCOSE, CAPILLARY
Glucose-Capillary: 206 mg/dL — ABNORMAL HIGH (ref 70–99)
Glucose-Capillary: 253 mg/dL — ABNORMAL HIGH (ref 70–99)
Glucose-Capillary: 272 mg/dL — ABNORMAL HIGH (ref 70–99)
Glucose-Capillary: 281 mg/dL — ABNORMAL HIGH (ref 70–99)
Glucose-Capillary: 283 mg/dL — ABNORMAL HIGH (ref 70–99)
Glucose-Capillary: 288 mg/dL — ABNORMAL HIGH (ref 70–99)
Glucose-Capillary: 390 mg/dL — ABNORMAL HIGH (ref 70–99)

## 2019-01-13 MED ORDER — PREDNISONE 50 MG PO TABS
60.0000 mg | ORAL_TABLET | Freq: Every day | ORAL | Status: DC
  Administered 2019-01-13 – 2019-01-14 (×2): 60 mg via ORAL
  Filled 2019-01-13 (×2): qty 1

## 2019-01-13 MED ORDER — MESALAMINE 1000 MG RE SUPP
1000.0000 mg | Freq: Two times a day (BID) | RECTAL | Status: DC
  Administered 2019-01-13 – 2019-01-14 (×3): 1000 mg via RECTAL
  Filled 2019-01-13 (×5): qty 1

## 2019-01-13 MED ORDER — INSULIN DETEMIR 100 UNIT/ML ~~LOC~~ SOLN
20.0000 [IU] | Freq: Every day | SUBCUTANEOUS | Status: DC
  Administered 2019-01-13: 20 [IU] via SUBCUTANEOUS
  Filled 2019-01-13 (×2): qty 0.2

## 2019-01-13 MED ORDER — INSULIN ASPART 100 UNIT/ML ~~LOC~~ SOLN
0.0000 [IU] | Freq: Every day | SUBCUTANEOUS | Status: DC
  Administered 2019-01-13: 22:00:00 3 [IU] via SUBCUTANEOUS

## 2019-01-13 MED ORDER — INSULIN DETEMIR 100 UNIT/ML ~~LOC~~ SOLN
25.0000 [IU] | Freq: Every day | SUBCUTANEOUS | Status: DC
  Filled 2019-01-13: qty 0.25

## 2019-01-13 MED ORDER — INSULIN ASPART 100 UNIT/ML ~~LOC~~ SOLN
0.0000 [IU] | Freq: Three times a day (TID) | SUBCUTANEOUS | Status: DC
  Administered 2019-01-13 (×2): 8 [IU] via SUBCUTANEOUS
  Administered 2019-01-13 – 2019-01-14 (×2): 5 [IU] via SUBCUTANEOUS
  Administered 2019-01-14: 11 [IU] via SUBCUTANEOUS

## 2019-01-13 MED ORDER — INSULIN ASPART 100 UNIT/ML ~~LOC~~ SOLN
8.0000 [IU] | Freq: Three times a day (TID) | SUBCUTANEOUS | Status: DC
  Administered 2019-01-13 – 2019-01-14 (×5): 8 [IU] via SUBCUTANEOUS

## 2019-01-13 NOTE — Progress Notes (Signed)
Subjective: Patient states that abdominal cramping has markedly improved, also states that the frequency of bowel movements has decreased.   Objective: Vital signs in last 24 hours: Temp:  [97.8 F (36.6 C)-98.8 F (37.1 C)] 97.8 F (36.6 C) (09/26 1400) Pulse Rate:  [62-98] 71 (09/26 1400) Resp:  [16-18] 16 (09/26 1400) BP: (115-144)/(55-61) 115/61 (09/26 1400) SpO2:  [91 %-97 %] 97 % (09/26 1400) Weight change:  Last BM Date: 01/12/19  PE: Lying on bed GENERAL: Mild pallor, no icterus ABDOMEN: Soft, nondistended, nontender EXTREMITIES:no deformity  Lab Results: Results for orders placed or performed during the hospital encounter of 01/05/19 (from the past 48 hour(s))  Glucose, capillary     Status: Abnormal   Collection Time: 01/11/19  5:00 PM  Result Value Ref Range   Glucose-Capillary 303 (H) 70 - 99 mg/dL  Glucose, capillary     Status: Abnormal   Collection Time: 01/11/19  8:43 PM  Result Value Ref Range   Glucose-Capillary 150 (H) 70 - 99 mg/dL  Glucose, capillary     Status: Abnormal   Collection Time: 01/11/19 11:58 PM  Result Value Ref Range   Glucose-Capillary 130 (H) 70 - 99 mg/dL  Glucose, capillary     Status: Abnormal   Collection Time: 01/12/19  4:22 AM  Result Value Ref Range   Glucose-Capillary 156 (H) 70 - 99 mg/dL  Glucose, capillary     Status: Abnormal   Collection Time: 01/12/19  7:54 AM  Result Value Ref Range   Glucose-Capillary 149 (H) 70 - 99 mg/dL  Glucose, capillary     Status: Abnormal   Collection Time: 01/12/19  5:04 PM  Result Value Ref Range   Glucose-Capillary 338 (H) 70 - 99 mg/dL  Glucose, capillary     Status: Abnormal   Collection Time: 01/12/19  7:44 PM  Result Value Ref Range   Glucose-Capillary 513 (HH) 70 - 99 mg/dL   Comment 1 Notify RN   Glucose, capillary     Status: Abnormal   Collection Time: 01/12/19  8:19 PM  Result Value Ref Range   Glucose-Capillary 522 (HH) 70 - 99 mg/dL   Comment 1 Notify RN   Glucose,  random     Status: Abnormal   Collection Time: 01/12/19  8:47 PM  Result Value Ref Range   Glucose, Bld 573 (HH) 70 - 99 mg/dL    Comment: CRITICAL RESULT CALLED TO, READ BACK BY AND VERIFIED WITH: LANGLEY,T RN @2136  ON 01/12/2019 JACKSON,K NURSE VERIFIED ACCURACY Performed at [facility], [place] [address] [place], [place] [address]   Glucose, capillary     Status: Abnormal   Collection Time: 01/13/19 12:02 AM  Result Value Ref Range   Glucose-Capillary 390 (H) 70 - 99 mg/dL  Glucose, capillary     Status: Abnormal   Collection Time: 01/13/19  3:55 AM  Result Value Ref Range   Glucose-Capillary 253 (H) 70 - 99 mg/dL  Comprehensive metabolic panel     Status: Abnormal   Collection Time: 01/13/19  4:51 AM  Result Value Ref Range   Sodium 138 135 - 145 mmol/L   Potassium 4.3 3.5 - 5.1 mmol/L   Chloride 111 98 - 111 mmol/L   CO2 20 (L) 22 - 32 mmol/L   Glucose, Bld 258 (H) 70 - 99 mg/dL   BUN 8 8 - 23 mg/dL   Creatinine, Ser 0.78 0.44 - 1.00 mg/dL   Calcium 7.7 (L) 8.9 - 10.3 mg/dL   Total  Protein 5.2 (L) 6.5 - 8.1 g/dL   Albumin 2.2 (L) 3.5 - 5.0 g/dL   AST 15 15 - 41 U/L   ALT 11 0 - 44 U/L   Alkaline Phosphatase 100 38 - 126 U/L   Total Bilirubin 0.3 0.3 - 1.2 mg/dL   GFR calc non Af Amer >60 >60 mL/min   GFR calc Af Amer >60 >60 mL/min   Anion gap 7 5 - 15    Comment: Performed at [facility], [place] [address] [place], [place] [address]  CBC     Status: Abnormal   Collection Time: 01/13/19  4:51 AM  Result Value Ref Range   WBC 7.5 4.0 - 10.5 K/uL   RBC 4.44 3.87 - 5.11 MIL/uL   Hemoglobin 11.2 (L) 12.0 - 15.0 g/dL   HCT 38.0 36.0 - 46.0 %   MCV 85.6 80.0 - 100.0 fL   MCH 25.2 (L) 26.0 - 34.0 pg   MCHC 29.5 (L) 30.0 - 36.0 g/dL   RDW 17.4 (H) 11.5 - 15.5 %   Platelets 308 150 - 400 K/uL   nRBC 0.0 0.0 - 0.2 %    Comment: Performed at [facility], [place] [address] [place], [place] [address]  Glucose, capillary      Status: Abnormal   Collection Time: 01/13/19  7:56 AM  Result Value Ref Range   Glucose-Capillary 206 (H) 70 - 99 mg/dL  Glucose, capillary     Status: Abnormal   Collection Time: 01/13/19 11:39 AM  Result Value Ref Range   Glucose-Capillary 281 (H) 70 - 99 mg/dL    Studies/Results: No results found.  Medications: I have reviewed the patient's current medications.  Assessment: Moderate left-sided ulcerative colitis, biopsies pending On IV Solu-Medrol 40 mg every 12 hours, along with mesalamine suppository 1000 mg as needed and Pentasa 1000 mg 4 times a day. Elevated blood sugar up to 573  Plan: Able to tolerate soft diet. Change IV steroids to oral prednisone, 60 mg p.o. daily. If stable by tomorrow morning, discharged on tapering dose of prednisone-40 mg p.o. daily for 1 week, then 30 mg p.o. daily for 1 week, and 20 mg p.o. daily for 1 week, then 10 mg p.o. daily for 1 week then discontinue. Patient to continue Canasa suppository 1 g, will change it to twice daily. Patient to continue Pentasa 1 g 4 times a day.   Ronnette Juniper 01/13/2019, 3:40 PM   Pager [phone] If no answer or after 5 PM call [phone]

## 2019-01-13 NOTE — Progress Notes (Signed)
PROGRESS NOTE    Leslie Benton  [id number] DOB: [birth year] DOA: 01/05/2019 PCP: [name], MD    Brief Narrative:  78 y.o.female,w anxiety/depression, chronic lower back pain, hypertension, hyperlipidemia, Dm2, Hypothyroidism, hemorrhage of the anus ?, apparently presents with c/o diarrhea for the past week. Liquid stool about 5x per day. Sometimes blood. Pt denies fever, chills, cp, palp, sob, n/v, abd pain, black stool  Assessment & Plan:   Principal Problem:   Diarrhea Active Problems:   Hypothyroidism   Essential hypertension   GERD   Anemia   Tachycardia   Elevated troponin   SVT (supraventricular tachycardia) (HCC)  Diarrhea secondary to Ulcerative Colitis -GI pathogen paneland Cdiff negative, reviewed -Appreciate assistance by GI. Pt now s/p flex sig with findings concerning for ulcerative colitis -GI recommendations for IV solumedrol q12hrs, Pentasa with repeat colon in 3-6 months -Pt was initially on empiric cipro and flagyl, since d/c'd 9/25 -Patient reports feeling better this am with decreased abd discomfort  Blood in stool -Stools are heme pos -hgb remain stable at 11.2, labs reviewed  Tachycardiawith elev trop -Trop Ielevated-appreciate cards input -D dimer noted to beelevated with follow up CTA chest neg for PE, reviewed -Cardiology following. C/w amioand metoprolol 25mg  BID -Per Cardiology trop elevation likely due to demand/strain in setting of significant tachycardia and acute GI illness -Remains stable this AM  Hypertension -Cont Lisinopril 10mg  po qday -Cont Metoprolol 25mg  po 4 times daily as pt tolerates -Stable at this time  Dm2 -Holding Amaryl while in hospital -Have stopped Metformin secondary to diarrhea -Glucose noted to be up to the 500's overnight, likely made worse by IV steroids for UC -Increase pre-meal aspart to 8 units and increased levemir to 20 units -Increased SSI coverage to moderate scale   Hypothyroidism Cont Levothyroxine175 micrograms po qday as tolerated   DVT prophylaxis: SCD's Code Status: Full Family Communication: Pt in room, family not at bedside Disposition Plan: SNF when stable and cleared by GI  Consultants:   GI  Procedures:   Flex sig 9/25  Antimicrobials: Anti-infectives (From admission, onward)   Start     Dose/Rate Route Frequency Ordered Stop   01/11/19 1230  fluconazole (DIFLUCAN) tablet 150 mg     150 mg Oral  Once 01/11/19 1127 01/11/19 1318   01/06/19 1000  ciprofloxacin (CIPRO) IVPB 400 mg  Status:  Discontinued     400 mg 200 mL/hr over 60 Minutes Intravenous Every 12 hours 01/06/19 0007 01/12/19 1213   01/06/19 0600  metroNIDAZOLE (FLAGYL) IVPB 500 mg  Status:  Discontinued     500 mg 100 mL/hr over 60 Minutes Intravenous Every 8 hours 01/06/19 0007 01/12/19 1213   01/05/19 2215  ciprofloxacin (CIPRO) IVPB 400 mg     400 mg 200 mL/hr over 60 Minutes Intravenous  Once 01/05/19 2200 01/06/19 0031   01/05/19 2215  metroNIDAZOLE (FLAGYL) IVPB 500 mg     500 mg 100 mL/hr over 60 Minutes Intravenous  Once 01/05/19 2200 01/05/19 2321      Subjective: Reports feeling improved today with less abd discomfort  Objective: Vitals:   01/12/19 1431 01/12/19 2012 01/13/19 0402 01/13/19 1400  BP: 130/60 (!) 144/58 (!) 116/55 115/61  Pulse: 77 98 62 71  Resp: 18  18 16   Temp: 98 F (36.7 C) 98.8 F (37.1 C) 98.4 F (36.9 C) 97.8 F (36.6 C)  TempSrc: Oral Oral  Oral  SpO2: 100% 91% 95% 97%  Weight:      Height:  Intake/Output Summary (Last 24 hours) at 01/13/2019 1435 Last data filed at 01/13/2019 1300 Gross per 24 hour  Intake 720 ml  Output -  Net 720 ml   Filed Weights   01/05/19 1856 01/12/19 1111  Weight: 74.8 kg 74.8 kg    Examination: General exam: Awake, laying in bed, in nad Respiratory system: Normal respiratory effort, no wheezing Cardiovascular system: regular rate, s1, s2 Gastrointestinal system: Soft,  nondistended, positive BS Central nervous system: CN2-12 grossly intact, strength intact Extremities: Perfused, no clubbing Skin: Normal skin turgor, no notable skin lesions seen Psychiatry: Mood normal // no visual hallucinations   Data Reviewed: I have personally reviewed following labs and imaging studies  CBC: Recent Labs  Lab 01/07/19 0402 01/08/19 0423 01/11/19 0454 01/13/19 0451  WBC 10.3 13.1* 9.4 7.5  NEUTROABS 6.7 9.8*  --   --   HGB 11.5* 11.5* 11.3* 11.2*  HCT 38.1 38.9 37.6 38.0  MCV 83.6 85.3 84.5 85.6  PLT 326 320 285 [id number]   Basic Metabolic Panel: Recent Labs  Lab 01/08/19 0423 01/09/19 0434 01/10/19 0415 01/11/19 0454 01/12/19 2047 01/13/19 0451  NA 136 135 135 135  --  138  K 3.7 4.0 3.6 3.7  --  4.3  CL 108 110 110 109  --  111  CO2 18* 18* 19* 20*  --  20*  GLUCOSE 118* 168* 196* 243* 573* 258*  BUN 13 9 7* 5*  --  8  CREATININE 0.74 0.75 0.72 0.69  --  0.78  CALCIUM 7.7* 7.5* 7.7* 7.8*  --  7.7*   GFR: Estimated Creatinine Clearance: 55.3 mL/min (by C-G formula based on SCr of 0.78 mg/dL). Liver Function Tests: Recent Labs  Lab 01/11/19 0454 01/13/19 0451  AST 7* 15  ALT 9 11  ALKPHOS 79 100  BILITOT 0.3 0.3  PROT 5.1* 5.2*  ALBUMIN 2.3* 2.2*   No results for input(s): LIPASE, AMYLASE in the last 168 hours. No results for input(s): AMMONIA in the last 168 hours. Coagulation Profile: No results for input(s): INR, PROTIME in the last 168 hours. Cardiac Enzymes: No results for input(s): CKTOTAL, CKMB, CKMBINDEX, TROPONINI in the last 168 hours. BNP (last 3 results) No results for input(s): PROBNP in the last 8760 hours. HbA1C: No results for input(s): HGBA1C in the last 72 hours. CBG: Recent Labs  Lab 01/12/19 2019 01/13/19 0002 01/13/19 0355 01/13/19 0756 01/13/19 1139  GLUCAP 522* 390* 253* 206* 281*   Lipid Profile: No results for input(s): CHOL, HDL, LDLCALC, TRIG, CHOLHDL, LDLDIRECT in the last 72 hours. Thyroid Function  Tests: No results for input(s): TSH, T4TOTAL, FREET4, T3FREE, THYROIDAB in the last 72 hours. Anemia Panel: No results for input(s): VITAMINB12, FOLATE, FERRITIN, TIBC, IRON, RETICCTPCT in the last 72 hours. Sepsis Labs: No results for input(s): PROCALCITON, LATICACIDVEN in the last 168 hours.  Recent Results (from the past 240 hour(s))  C difficile quick scan w PCR reflex     Status: None   Collection Time: 01/05/19  9:04 PM   Specimen: STOOL  Result Value Ref Range Status   C Diff antigen NEGATIVE NEGATIVE Final   C Diff toxin NEGATIVE NEGATIVE Final   C Diff interpretation No C. difficile detected.  Final    Comment: Performed at [facility], [place] [address], [place], [place] [address]  SARS Coronavirus 2 [facility] order, Performed in [facility] hospital lab) Nasopharyngeal Nasopharyngeal Swab     Status: None   Collection Time: 01/05/19  9:04  PM   Specimen: Nasopharyngeal Swab  Result Value Ref Range Status   SARS Coronavirus 2 NEGATIVE NEGATIVE Final    Comment: (NOTE) If result is NEGATIVE SARS-CoV-2 target nucleic acids are NOT DETECTED. The SARS-CoV-2 RNA is generally detectable in upper and lower  respiratory specimens during the acute phase of infection. The lowest  concentration of SARS-CoV-2 viral copies this assay can detect is 250  copies / mL. A negative result does not preclude SARS-CoV-2 infection  and should not be used as the sole basis for treatment or other  patient management decisions.  A negative result may occur with  improper specimen collection / handling, submission of specimen other  than nasopharyngeal swab, presence of viral mutation(s) within the  areas targeted by this assay, and inadequate number of viral copies  (<250 copies / mL). A negative result must be combined with clinical  observations, patient history, and epidemiological information. If result is POSITIVE SARS-CoV-2 target nucleic acids are DETECTED. The SARS-CoV-2  RNA is generally detectable in upper and lower  respiratory specimens dur ing the acute phase of infection.  Positive  results are indicative of active infection with SARS-CoV-2.  Clinical  correlation with patient history and other diagnostic information is  necessary to determine patient infection status.  Positive results do  not rule out bacterial infection or co-infection with other viruses. If result is PRESUMPTIVE POSTIVE SARS-CoV-2 nucleic acids MAY BE PRESENT.   A presumptive positive result was obtained on the submitted specimen  and confirmed on repeat testing.  While 2019 novel coronavirus  (SARS-CoV-2) nucleic acids may be present in the submitted sample  additional confirmatory testing may be necessary for epidemiological  and / or clinical management purposes  to differentiate between  SARS-CoV-2 and other Sarbecovirus currently known to infect humans.  If clinically indicated additional testing with an alternate test  methodology [phone]) is advised. The SARS-CoV-2 RNA is generally  detectable in upper and lower respiratory sp ecimens during the acute  phase of infection. The expected result is Negative. Fact Sheet for Patients:  StrictlyIdeas.no Fact Sheet for Healthcare Providers: BankingDealers.co.za This test is not yet approved or cleared by the [place] FDA and has been authorized for detection and/or diagnosis of SARS-CoV-2 by FDA under an Emergency Use Authorization (EUA).  This EUA will remain in effect (meaning this test can be used) for the duration of the COVID-19 declaration under Section 564(b)(1) of the Act, 21 U.S.C. section 360bbb-3(b)(1), unless the authorization is terminated or revoked sooner. Performed at [facility], [place] [address], [place], [place] [address]      Radiology Studies: No results found.  Scheduled Meds: . amiodarone  200 mg Oral Daily  . busPIRone  15 mg  Oral Daily  . diphenhydrAMINE  50 mg Oral Once  . fluticasone  2 spray Each Nare Daily  . insulin aspart  0-15 Units Subcutaneous TID WC  . insulin aspart  0-5 Units Subcutaneous QHS  . insulin aspart  8 Units Subcutaneous TID WC  . insulin detemir  20 Units Subcutaneous QHS  . levothyroxine  175 mcg Oral QAC breakfast  . lisinopril  10 mg Oral Daily  . mesalamine  1,000 mg Oral QID  . methylPREDNISolone (SOLU-MEDROL) injection  40 mg Intravenous Q12H  . metoprolol tartrate  25 mg Oral QID  . nystatin cream   Topical BID   Continuous Infusions:    LOS: 7 days   Marylu Lund, MD [facility] Pager On Amion  If 7PM-7AM, please contact night-coverage 01/13/2019, 2:35 PM

## 2019-01-13 NOTE — TOC Progression Note (Signed)
Transition of Care [facility]) - Progression Note    Patient Details  Name: Leslie Benton MRN: [id number] Date of Birth: 24-May-1940  Transition of Care [facility]) CM/SW Contact  Servando Snare, [place] Phone Number: 01/13/2019, 1:26 PM  Clinical Narrative:    LCSW provided patient with bed offers. Patient stated she will go to rehab depending on the facility, [place] status of facility and how long she will be there.   LCSW faxed daughter Roxanna bed offers as well.        Expected Discharge Plan and Services                                                 Social Determinants of Health (SDOH) Interventions    Readmission Risk Interventions No flowsheet data found.

## 2019-01-14 LAB — CBC
HCT: 36.9 % (ref 36.0–46.0)
Hemoglobin: 11.2 g/dL — ABNORMAL LOW (ref 12.0–15.0)
MCH: 25.2 pg — ABNORMAL LOW (ref 26.0–34.0)
MCHC: 30.4 g/dL (ref 30.0–36.0)
MCV: 82.9 fL (ref 80.0–100.0)
Platelets: 296 10*3/uL (ref 150–400)
RBC: 4.45 MIL/uL (ref 3.87–5.11)
RDW: 17.2 % — ABNORMAL HIGH (ref 11.5–15.5)
WBC: 8.2 10*3/uL (ref 4.0–10.5)
nRBC: 0 % (ref 0.0–0.2)

## 2019-01-14 LAB — COMPREHENSIVE METABOLIC PANEL
ALT: 14 U/L (ref 0–44)
AST: 14 U/L — ABNORMAL LOW (ref 15–41)
Albumin: 2.4 g/dL — ABNORMAL LOW (ref 3.5–5.0)
Alkaline Phosphatase: 111 U/L (ref 38–126)
Anion gap: 7 (ref 5–15)
BUN: 16 mg/dL (ref 8–23)
CO2: 20 mmol/L — ABNORMAL LOW (ref 22–32)
Calcium: 7.9 mg/dL — ABNORMAL LOW (ref 8.9–10.3)
Chloride: 109 mmol/L (ref 98–111)
Creatinine, Ser: 0.77 mg/dL (ref 0.44–1.00)
GFR calc Af Amer: >60 mL/min (ref >60)
GFR calc non Af Amer: >60 mL/min (ref >60)
Glucose, Bld: 242 mg/dL — ABNORMAL HIGH (ref 70–99)
Potassium: 4.8 mmol/L (ref 3.5–5.1)
Sodium: 136 mmol/L (ref 135–145)
Total Bilirubin: 0.1 mg/dL — ABNORMAL LOW (ref 0.3–1.2)
Total Protein: 5.4 g/dL — ABNORMAL LOW (ref 6.5–8.1)

## 2019-01-14 LAB — SARS CORONAVIRUS 2 BY RT PCR (HOSPITAL ORDER, PERFORMED IN ~~LOC~~ HOSPITAL LAB): SARS Coronavirus 2: NEGATIVE

## 2019-01-14 LAB — GLUCOSE, CAPILLARY
Glucose-Capillary: 217 mg/dL — ABNORMAL HIGH (ref 70–99)
Glucose-Capillary: 206 mg/dL — ABNORMAL HIGH (ref 70–99)
Glucose-Capillary: 333 mg/dL — ABNORMAL HIGH (ref 70–99)
Glucose-Capillary: 281 mg/dL — ABNORMAL HIGH (ref 70–99)

## 2019-01-14 MED ORDER — PREDNISONE 10 MG PO TABS: ORAL_TABLET | ORAL | Status: DC

## 2019-01-14 MED ORDER — METOPROLOL TARTRATE 25 MG PO TABS: 25.0000 mg | ORAL_TABLET | Freq: Four times a day (QID) | ORAL | 0 refills | Status: AC

## 2019-01-14 MED ORDER — MESALAMINE ER 250 MG PO CPCR: 1000.0000 mg | ORAL_CAPSULE | Freq: Four times a day (QID) | ORAL | 0 refills | Status: AC

## 2019-01-14 MED ORDER — INSULIN ASPART 100 UNIT/ML FLEXPEN: PEN_INJECTOR | SUBCUTANEOUS | 11 refills | Status: DC

## 2019-01-14 MED ORDER — INSULIN ASPART 100 UNIT/ML FLEXPEN: 8.0000 [IU] | PEN_INJECTOR | Freq: Three times a day (TID) | SUBCUTANEOUS | 0 refills | Status: DC

## 2019-01-14 MED ORDER — INSULIN DETEMIR 100 UNIT/ML FLEXPEN: 20.0000 [IU] | PEN_INJECTOR | Freq: Every day | SUBCUTANEOUS | 0 refills | Status: DC

## 2019-01-14 MED ORDER — MESALAMINE 1000 MG RE SUPP: 1000.0000 mg | Freq: Two times a day (BID) | RECTAL | 0 refills | Status: AC

## 2019-01-14 MED ORDER — AMIODARONE HCL 200 MG PO TABS: 200.0000 mg | ORAL_TABLET | Freq: Every day | ORAL | 0 refills | Status: DC

## 2019-01-14 NOTE — Progress Notes (Signed)
Subjective: The patient had a semi-formed large Bm today and denies abdominal pain. She is being discharged to a SNF.  Objective: Vital signs in last 24 hours: Temp:  [98 F (36.7 C)-98.3 F (36.8 C)] 98 F (36.7 C) (09/27 0422) Pulse Rate:  [56-63] 56 (09/27 0422) Resp:  [16-18] 16 (09/27 0422) BP: (152-168)/(64-65) 168/65 (09/27 0422) SpO2:  [96 %-99 %] 99 % (09/27 0422) Weight change:  Last BM Date: 01/13/19  [id number] up on bed GENERAL:Eating regular food  ABDOMEN:Non distended EXTREMITIES:no deformity  Lab Results: Results for orders placed or performed during the hospital encounter of 01/05/19 (from the past 48 hour(s))  Glucose, capillary     Status: Abnormal   Collection Time: 01/12/19  5:04 PM  Result Value Ref Range   Glucose-Capillary 338 (H) 70 - 99 mg/dL  Glucose, capillary     Status: Abnormal   Collection Time: 01/12/19  7:44 PM  Result Value Ref Range   Glucose-Capillary 513 (HH) 70 - 99 mg/dL   Comment 1 Notify RN   Glucose, capillary     Status: Abnormal   Collection Time: 01/12/19  8:19 PM  Result Value Ref Range   Glucose-Capillary 522 (HH) 70 - 99 mg/dL   Comment 1 Notify RN   Glucose, random     Status: Abnormal   Collection Time: 01/12/19  8:47 PM  Result Value Ref Range   Glucose, Bld 573 (HH) 70 - 99 mg/dL    Comment: CRITICAL RESULT CALLED TO, READ BACK BY AND VERIFIED WITH: LANGLEY,T RN @2136  ON 01/12/2019 JACKSON,K NURSE VERIFIED ACCURACY Performed at [facility], [place] [address], [place], [place] [address]   Glucose, capillary     Status: Abnormal   Collection Time: 01/13/19 12:02 AM  Result Value Ref Range   Glucose-Capillary 390 (H) 70 - 99 mg/dL  Glucose, capillary     Status: Abnormal   Collection Time: 01/13/19  3:55 AM  Result Value Ref Range   Glucose-Capillary 253 (H) 70 - 99 mg/dL  Comprehensive metabolic panel     Status: Abnormal   Collection Time: 01/13/19  4:51 AM  Result Value Ref Range   Sodium 138  135 - 145 mmol/L   Potassium 4.3 3.5 - 5.1 mmol/L   Chloride 111 98 - 111 mmol/L   CO2 20 (L) 22 - 32 mmol/L   Glucose, Bld 258 (H) 70 - 99 mg/dL   BUN 8 8 - 23 mg/dL   Creatinine, Ser 0.78 0.44 - 1.00 mg/dL   Calcium 7.7 (L) 8.9 - 10.3 mg/dL   Total Protein 5.2 (L) 6.5 - 8.1 g/dL   Albumin 2.2 (L) 3.5 - 5.0 g/dL   AST 15 15 - 41 U/L   ALT 11 0 - 44 U/L   Alkaline Phosphatase 100 38 - 126 U/L   Total Bilirubin 0.3 0.3 - 1.2 mg/dL   GFR calc non Af Amer >60 >60 mL/min   GFR calc Af Amer >60 >60 mL/min   Anion gap 7 5 - 15    Comment: Performed at [facility], [place] [address] [place], [place] [address]  CBC     Status: Abnormal   Collection Time: 01/13/19  4:51 AM  Result Value Ref Range   WBC 7.5 4.0 - 10.5 K/uL   RBC 4.44 3.87 - 5.11 MIL/uL   Hemoglobin 11.2 (L) 12.0 - 15.0 g/dL   HCT 38.0 36.0 - 46.0 %   MCV 85.6 80.0 - 100.0 fL  MCH 25.2 (L) 26.0 - 34.0 pg   MCHC 29.5 (L) 30.0 - 36.0 g/dL   RDW 17.4 (H) 11.5 - 15.5 %   Platelets 308 150 - 400 K/uL   nRBC 0.0 0.0 - 0.2 %    Comment: Performed at [facility], [place] [address] [place], [place] [address]  Glucose, capillary     Status: Abnormal   Collection Time: 01/13/19  7:56 AM  Result Value Ref Range   Glucose-Capillary 206 (H) 70 - 99 mg/dL  Glucose, capillary     Status: Abnormal   Collection Time: 01/13/19 11:39 AM  Result Value Ref Range   Glucose-Capillary 281 (H) 70 - 99 mg/dL  Glucose, capillary     Status: Abnormal   Collection Time: 01/13/19  4:25 PM  Result Value Ref Range   Glucose-Capillary 283 (H) 70 - 99 mg/dL  Glucose, capillary     Status: Abnormal   Collection Time: 01/13/19  9:10 PM  Result Value Ref Range   Glucose-Capillary 288 (H) 70 - 99 mg/dL  Glucose, capillary     Status: Abnormal   Collection Time: 01/13/19 11:34 PM  Result Value Ref Range   Glucose-Capillary 272 (H) 70 - 99 mg/dL  Comprehensive metabolic panel     Status: Abnormal   Collection  Time: 01/14/19  3:52 AM  Result Value Ref Range   Sodium 136 135 - 145 mmol/L   Potassium 4.8 3.5 - 5.1 mmol/L   Chloride 109 98 - 111 mmol/L   CO2 20 (L) 22 - 32 mmol/L   Glucose, Bld 242 (H) 70 - 99 mg/dL   BUN 16 8 - 23 mg/dL   Creatinine, Ser 0.77 0.44 - 1.00 mg/dL   Calcium 7.9 (L) 8.9 - 10.3 mg/dL   Total Protein 5.4 (L) 6.5 - 8.1 g/dL   Albumin 2.4 (L) 3.5 - 5.0 g/dL   AST 14 (L) 15 - 41 U/L   ALT 14 0 - 44 U/L   Alkaline Phosphatase 111 38 - 126 U/L   Total Bilirubin 0.1 (L) 0.3 - 1.2 mg/dL   GFR calc non Af Amer >60 >60 mL/min   GFR calc Af Amer >60 >60 mL/min   Anion gap 7 5 - 15    Comment: Performed at [facility], [place] [address], [place], [place] [address]  CBC     Status: Abnormal   Collection Time: 01/14/19  3:52 AM  Result Value Ref Range   WBC 8.2 4.0 - 10.5 K/uL   RBC 4.45 3.87 - 5.11 MIL/uL   Hemoglobin 11.2 (L) 12.0 - 15.0 g/dL   HCT 36.9 36.0 - 46.0 %   MCV 82.9 80.0 - 100.0 fL   MCH 25.2 (L) 26.0 - 34.0 pg   MCHC 30.4 30.0 - 36.0 g/dL   RDW 17.2 (H) 11.5 - 15.5 %   Platelets 296 150 - 400 K/uL   nRBC 0.0 0.0 - 0.2 %    Comment: Performed at [facility], [place] [address] [place], [place] [address]  Glucose, capillary     Status: Abnormal   Collection Time: 01/14/19  4:14 AM  Result Value Ref Range   Glucose-Capillary 217 (H) 70 - 99 mg/dL  Glucose, capillary     Status: Abnormal   Collection Time: 01/14/19  8:13 AM  Result Value Ref Range   Glucose-Capillary 206 (H) 70 - 99 mg/dL   Comment 1 Notify RN    Comment 2 Document in Chart  SARS Coronavirus 2 [facility] order, Performed in [facility] hospital lab) Nasopharyngeal Nasopharyngeal Swab     Status: None   Collection Time: 01/14/19 11:21 AM   Specimen: Nasopharyngeal Swab  Result Value Ref Range   SARS Coronavirus 2 NEGATIVE NEGATIVE    Comment: (NOTE) If result is NEGATIVE SARS-CoV-2 target nucleic acids are NOT DETECTED. The SARS-CoV-2 RNA is  generally detectable in upper and lower  respiratory specimens during the acute phase of infection. The lowest  concentration of SARS-CoV-2 viral copies this assay can detect is 250  copies / mL. A negative result does not preclude SARS-CoV-2 infection  and should not be used as the sole basis for treatment or other  patient management decisions.  A negative result may occur with  improper specimen collection / handling, submission of specimen other  than nasopharyngeal swab, presence of viral mutation(s) within the  areas targeted by this assay, and inadequate number of viral copies  (<250 copies / mL). A negative result must be combined with clinical  observations, patient history, and epidemiological information. If result is POSITIVE SARS-CoV-2 target nucleic acids are DETECTED. The SARS-CoV-2 RNA is generally detectable in upper and lower  respiratory specimens dur ing the acute phase of infection.  Positive  results are indicative of active infection with SARS-CoV-2.  Clinical  correlation with patient history and other diagnostic information is  necessary to determine patient infection status.  Positive results do  not rule out bacterial infection or co-infection with other viruses. If result is PRESUMPTIVE POSTIVE SARS-CoV-2 nucleic acids MAY BE PRESENT.   A presumptive positive result was obtained on the submitted specimen  and confirmed on repeat testing.  While 2019 novel coronavirus  (SARS-CoV-2) nucleic acids may be present in the submitted sample  additional confirmatory testing may be necessary for epidemiological  and / or clinical management purposes  to differentiate between  SARS-CoV-2 and other Sarbecovirus currently known to infect humans.  If clinically indicated additional testing with an alternate test  methodology [phone]) is advised. The SARS-CoV-2 RNA is generally  detectable in upper and lower respiratory sp ecimens during the acute  phase of  infection. The expected result is Negative. Fact Sheet for Patients:  StrictlyIdeas.no Fact Sheet for Healthcare Providers: BankingDealers.co.za This test is not yet approved or cleared by the [place] FDA and has been authorized for detection and/or diagnosis of SARS-CoV-2 by FDA under an Emergency Use Authorization (EUA).  This EUA will remain in effect (meaning this test can be used) for the duration of the COVID-19 declaration under Section 564(b)(1) of the Act, 21 U.S.C. section 360bbb-3(b)(1), unless the authorization is terminated or revoked sooner. Performed at [facility], [place] [address] [place], [place] [address]   Glucose, capillary     Status: Abnormal   Collection Time: 01/14/19 11:53 AM  Result Value Ref Range   Glucose-Capillary 333 (H) 70 - 99 mg/dL   Comment 1 Notify RN    Comment 2 Document in Chart     Studies/Results: No results found.  Medications: I have reviewed the patient's current medications.  Assessment: Left sided ulcerative colitis, biopsies pending  Plan: Continue steroid taper as recommended yesterday. Continue mesalamine suppository 1 gm BID and PO mesalamine/pentasa 1 gm QID. To follow up with Dr.Buccini as an outpatient in a few weeks.  Ronnette Juniper, MD 01/14/2019, 2:14 PM

## 2019-01-14 NOTE — Progress Notes (Signed)
CSW received a call from [place] in admissions at [facility]) at ph: [phone] that pt has been accepted.  CSW received a call from [place] at [facility] stating the patient has been offered a bed and has been accepted and that the pt can arrive on 9/27.  The pt's accepting doctor is the SNF MD.  The room number will be 312.  The number for report is [phone].  RN updated.  CSW will update RN and EDP.  Alphonse Guild. Chantella Creech, Latanya Presser, LCAS Clinical Social Worker Ph: [phone]

## 2019-01-14 NOTE — Progress Notes (Addendum)
CSW received a call from [place] in admissions at [facility] who states that they need an updated COVID test as last one was on 9/18, per [facility].  EDP/RN updated.  11:09 AM Per provider, it must be documented that dispo is urgent and is requesting collateral on if pt's bed at [facility] may be lost if the rapid COVID test is not ordered instead of the 24 hour result option.    CSW called [facility] and per [facility] at [facility] who verified [facility] is not able to hold the bed for 9/28 and that a rapid turn-around COVID test in necessary to secure pt's bed at [facility] today.  RN/EDP updated and per RN, EDP to order rapid COVID test now.  CSW will continue to follow for D/C needs.  Alphonse Guild. Twinkle Sockwell, LCSW, LCAS, CSI Transitions of Care Clinical Social Worker Care Coordination Department Ph: [phone]

## 2019-01-14 NOTE — Progress Notes (Addendum)
CSW received a call from Ethel, pt's daughter, who states family's preference for SNF is [place] and [place], per pt's daughter.  CSW will continue to follow for D/C needs.  Alphonse Guild. Ethan Clayburn, LCSW, LCAS, CSI Transitions of Care Clinical Social Worker Care Coordination Department Ph: [phone]

## 2019-01-14 NOTE — Progress Notes (Deleted)
CSW called Nikki at [facility] to see if pt can go today and did not hear back.  2nd shift CSW will leave handoff for 1st shift TOC CM/CSW.  Please reconsult if future social work needs arise.  CSW signing off, as social work intervention is no longer needed.  Alphonse Guild. Kayle Correa, LCSW, LCAS, CSI Transitions of Care Clinical Social Worker Care Coordination Department Ph: [phone]

## 2019-01-14 NOTE — Progress Notes (Signed)
CSW called admission person at [facility]) at ph: [phone] and asked that they review pt's referral.  [facility] to update soon.  CSW called pt's RN who verified pt is ready for D/C today and can be updated at ph: [phone].  .CSW will continue to follow for D/C needs.  Alphonse Guild. Shizue Kaseman, LCSW, LCAS, CSI Transitions of Care Clinical Social Worker Care Coordination Department Ph: [phone]

## 2019-01-14 NOTE — Progress Notes (Addendum)
This RN attempted to call report to [facility] several times at number [phone] and [phone], no answer. Pt already left for [facility], charge RN notified.   [phone]: [facility] RN called this RN, this RN able to give report.

## 2019-01-14 NOTE — Discharge Summary (Signed)
Physician Discharge Summary  Leslie Benton JSR:[id number] DOB: 1940-07-06 DOA: 01/05/2019  PCP: [name], MD  Admit date: 01/05/2019 Discharge date: 01/14/2019  Admitted From: Home Disposition:  SNF  Recommendations for Outpatient Follow-up:  1. Follow up with PCP in 1-2 weeks 2. Please adjust insulin as needed, will likely need to gradually decrease dosage as pt's steroids are being weaned 3. Prior to admit, pt was on Amaryl 32m daily with metformin 1gm daily for diabetes 4. Prednisone taper as follows: 40 mg p.o. daily for 1 week, then 30 mg p.o. daily for 1 week, and 20 mg p.o. daily for 1 week, then 10 mg p.o. daily for 1 week then discontinue 5. Follow up with GI as scheduled  Discharge Condition:Improved CODE STATUS:Full Diet recommendation: Diabetic   Brief/Interim Summary: 78y.o.female,w anxiety/depression, chronic lower back pain, hypertension, hyperlipidemia, Dm2, Hypothyroidism, hemorrhage of the anus ?, apparently presents with c/o diarrhea for the past week. Liquid stool about 5x per day. Sometimes blood. 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 Pt now s/p flex sig with findings concerning for ulcerative colitis -GI recommendations steroid taper, Pentasa with repeat colon in 3-6 months -Pt was initially on empiric cipro and flagyl, since d/c'd 9/25 -Patient reports feeling better this am with decreased abd discomfort  Blood in stool -Stools are heme pos -hgb remain stable  Tachycardiawith elev trop -Trop Ielevated-appreciate cards input -D dimer noted to beelevated with follow up CTA  chest neg for PE, reviewed -Cardiology following. C/w amioand metoprolol 263mBID -Per Cardiology trop elevation likely due to demand/strain in setting of significant tachycardia and acute GI illness -Remains stable this AM  Hypertension -Cont Lisinopril 104mo qday -Cont Metoprolol 74m53m 4 times daily as pt tolerates -Stable at this time  Dm2 -[employer] metformin -Glucose made worse in hosptial, likely secondary to steroids for UC -continued on pre-meal aspart to 8 units and increased levemir to 20 units -Increased SSI coverage to moderate scale  Hypothyroidism Cont Levothyroxine175 micrograms po qday as tolerated  Discharge Instructions   Allergies as of 01/14/2019      Reactions   Niacin And Related Hives, Swelling   No anaphalaxis, but strong reactions.   Lactose Intolerance (gi)    "bad taste in mouth"   Amoxicillin Swelling   hives   Lactase    Penicillins Swelling   Hives With all cillins      Medication List    STOP taking these medications   glimepiride 1 MG tablet Commonly known as: AMARYL   metFORMIN 1000 MG tablet Commonly known as: GLUCOPHAGE   propranolol 20 MG tablet Commonly known as: INDERAL     TAKE these medications   amiodarone 200 MG tablet Commonly known as: PACERONE Take 1 tablet (200 mg total) by mouth daily. Start taking on: January 15, 2019   busPIRone 15 MG tablet Commonly known as: BUSPAR Take 1 tablet (15 mg total) by mouth daily. What changed:   when to take this  reasons to take this   fluticasone 50 MCG/ACT nasal spray Commonly known as: FLONASE SHAKE LIQUID AND USE 2 SPRAYS IN EACH NOSTRIL DAILY What changed: See the  new instructions.   ibuprofen 200 MG tablet Commonly known as: ADVIL Take 400 mg by mouth as needed for headache or moderate pain.   insulin aspart 100 UNIT/ML FlexPen Commonly known as: NOVOLOG Inject 8 Units into the skin 3 (three) times daily with meals.   Insulin Detemir 100  UNIT/ML Pen Commonly known as: LEVEMIR Inject 20 Units into the skin daily.   levothyroxine 175 MCG tablet Commonly known as: Synthroid Take 1 tablet (175 mcg total) by mouth daily before breakfast.   lisinopril 10 MG tablet Commonly known as: ZESTRIL Take 1 tablet (10 mg total) by mouth daily.   loratadine 10 MG tablet Commonly known as: CLARITIN Take 10 mg by mouth daily as needed for allergies or rhinitis.   mesalamine 1000 MG suppository Commonly known as: CANASA Place 1 suppository (1,000 mg total) rectally 2 (two) times daily. What changed:   when to take this  reasons to take this   mesalamine 250 MG CR capsule Commonly known as: PENTASA Take 4 capsules (1,000 mg total) by mouth 4 (four) times daily. What changed: You were already taking a medication with the same name, and this prescription was added. Make sure you understand how and when to take each.   metoprolol tartrate 25 MG tablet Commonly known as: LOPRESSOR Take 1 tablet (25 mg total) by mouth 4 (four) times daily.   ONE TOUCH ULTRA 2 w/Device Kit Use to test blood sugar 1-2 times daily.   OneTouch Ultra test strip Generic drug: glucose blood Use to test blood sugars 1-2 times daily.   onetouch ultrasoft lancets Use to test blood sugars 1-2 times daily.   phentermine 37.5 MG capsule Take 1 capsule (37.5 mg total) by mouth as needed.   predniSONE 10 MG tablet Commonly known as: DELTASONE Taper dose: 40 mg p.o. daily for 1 week, then 30 mg p.o. daily for 1 week, and 20 mg p.o. daily for 1 week, then 10 mg p.o. daily for 1 week then discontinue, zero refills      Follow-up Information    [name], MD. Schedule an appointment as soon as possible for a visit in 1 week(s).   Specialty: Family Medicine Contact information: [place] [address] [phone]          Allergies  Allergen Reactions  . Niacin And Related Hives and Swelling    No  anaphalaxis, but strong reactions.  . Lactose Intolerance (Gi)     "bad taste in mouth"  . Amoxicillin Swelling    hives  . Lactase   . Penicillins Swelling    Hives With all cillins    Consultations:  GI  Procedures/Studies: Ct Angio Chest Pe W Or Wo Contrast  Result Date: 01/06/2019 CLINICAL DATA:  Shortness of breath. Positive D-dimer. Patient presented with abdominal pain, diarrhea and weakness and underwent abdomen and pelvis CT scan last night, which showed findings of colitis. EXAM: CT ANGIOGRAPHY CHEST WITH CONTRAST TECHNIQUE: Multidetector CT imaging of the chest was performed using the standard protocol during bolus administration of intravenous contrast. Multiplanar CT image reconstructions and MIPs were obtained to evaluate the vascular anatomy. CONTRAST:  16m OMNIPAQUE IOHEXOL 350 MG/ML SOLN COMPARISON:  Recent abdomen and pelvis CT. Chest radiograph dated 09/29/2013. FINDINGS: Cardiovascular: There is satisfactory opacification of the pulmonary arteries to the segmental level. There is no evidence of a pulmonary embolism. Heart is normal in size. Minor left coronary artery calcifications. No pericardial effusion. Great vessels are normal caliber.  Aortic atherosclerosis. No dissection. Mediastinum/Nodes: Moderate-sized hiatal hernia. No neck base, mediastinal or hilar masses or enlarged lymph nodes. Trachea is unremarkable. Lungs/Pleura: No evidence of pneumonia or pulmonary edema. 5 mm ground-glass nodule, right upper lobe, image 35, series 6. 2-3 mm nodule, peripheral left lower lobe, image 69, series 6. No other nodules. Minor dependent lower lobe atelectasis, greater on the right. No pleural effusion or pneumothorax. Upper Abdomen: Partly imaged left transverse colon mild adjacent hazy inflammation consistent colitis as noted on the current abdomen and pelvis CT. Musculoskeletal: No fracture or acute finding. No osteoblastic or osteolytic lesions. Review of the MIP images  confirms the above findings. IMPRESSION: 1. No evidence of a pulmonary embolism. 2. No acute findings. 3. Two small lung nodules, largest a 5 mm right upper lobe ground-glass nodule. No follow-up needed if patient is low-risk (and has no known or suspected primary neoplasm). Non-contrast chest CT can be considered in 12 months if patient is high-risk. This recommendation follows the consensus statement: Guidelines for Management of Incidental Pulmonary Nodules Detected on CT Images: From the Fleischner Society 2017; Radiology 2017; 284:228-243. 4. Moderate hiatal hernia. Aortic Atherosclerosis (ICD10-I70.0). Electronically Signed   By: Lajean Manes M.D.   On: 01/06/2019 05:51   Ct Abdomen Pelvis W Contrast  Result Date: 01/05/2019 CLINICAL DATA:  Abdominal pain, diarrhea, weakness EXAM: CT ABDOMEN AND PELVIS WITH CONTRAST TECHNIQUE: Multidetector CT imaging of the abdomen and pelvis was performed using the standard protocol following bolus administration of intravenous contrast. CONTRAST:  164m OMNIPAQUE IOHEXOL 300 MG/ML  SOLN COMPARISON:  None. FINDINGS: Lower chest: No acute abnormality.  Moderate hiatal hernia. Hepatobiliary: No solid liver abnormality is seen. No gallstones, gallbladder wall thickening, or biliary dilatation. Pancreas: Unremarkable. No pancreatic ductal dilatation or surrounding inflammatory changes. Spleen: Normal in size without significant abnormality. Adrenals/Urinary Tract: Adrenal glands are unremarkable. Kidneys are normal, without renal calculi, solid lesion, or hydronephrosis. Bladder is unremarkable. Stomach/Bowel: Stomach is within normal limits. Appendix is not clearly visualized and may be surgically absent. There is diffuse colonic wall thickening and vascular combing most conspicuous involving the transverse colon to the rectum. Fluid-filled distal colon and rectum. Vascular/Lymphatic: Aortic atherosclerosis. No enlarged abdominal or pelvic lymph nodes. Reproductive: No  mass or other significant abnormality. Other: Small, fat containing umbilical hernia. No abdominopelvic ascites. Musculoskeletal: No acute or significant osseous findings. IMPRESSION: 1. There is diffuse colonic wall thickening and vascular combing most conspicuous involving the transverse colon to the rectum. Fluid-filled distal colon and rectum. Findings are consistent with nonspecific infectious, inflammatory, or ischemic colitis. 2.  Hiatal hernia. 3.  Aortic Atherosclerosis (ICD10-I70.0). Electronically Signed   By: AEddie CandleM.D.   On: 01/05/2019 21:56     Subjective: Reports feeling better  Discharge Exam: Vitals:   01/13/19 2113 01/14/19 0422  BP: (!) 152/64 (!) 168/65  Pulse: 63 (!) 56  Resp: 18 16  Temp: 98.3 F (36.8 C) 98 F (36.7 C)  SpO2: 96% 99%   Vitals:   01/13/19 0402 01/13/19 1400 01/13/19 2113 01/14/19 0422  BP: (!) 116/55 115/61 (!) 152/64 (!) 168/65  Pulse: 62 71 63 (!) 56  Resp: 18 16 18 16   Temp: 98.4 F (36.9 C) 97.8 F (36.6 C) 98.3 F (36.8 C) 98 F (36.7 C)  TempSrc:  Oral Oral Oral  SpO2: 95% 97% 96% 99%  Weight:      Height:        General: Pt is alert, awake, not in acute distress Cardiovascular: RRR,  S1/S2 +, no rubs, no gallops Respiratory: CTA bilaterally, no wheezing, no rhonchi Abdominal: Soft, NT, ND, bowel sounds + Extremities: no edema, no cyanosis   The results of significant diagnostics from this hospitalization (including imaging, microbiology, ancillary and laboratory) are listed below for reference.     Microbiology: Recent Results (from the past 240 hour(s))  C difficile quick scan w PCR reflex     Status: None   Collection Time: 01/05/19  9:04 PM   Specimen: STOOL  Result Value Ref Range Status   C Diff antigen NEGATIVE NEGATIVE Final   C Diff toxin NEGATIVE NEGATIVE Final   C Diff interpretation No C. difficile detected.  Final    Comment: Performed at [facility], [place] [address],  [place], [place] [address]  SARS Coronavirus 2 [facility] order, Performed in [facility] hospital lab) Nasopharyngeal Nasopharyngeal Swab     Status: None   Collection Time: 01/05/19  9:04 PM   Specimen: Nasopharyngeal Swab  Result Value Ref Range Status   SARS Coronavirus 2 NEGATIVE NEGATIVE Final    Comment: (NOTE) If result is NEGATIVE SARS-CoV-2 target nucleic acids are NOT DETECTED. The SARS-CoV-2 RNA is generally detectable in upper and lower  respiratory specimens during the acute phase of infection. The lowest  concentration of SARS-CoV-2 viral copies this assay can detect is 250  copies / mL. A negative result does not preclude SARS-CoV-2 infection  and should not be used as the sole basis for treatment or other  patient management decisions.  A negative result may occur with  improper specimen collection / handling, submission of specimen other  than nasopharyngeal swab, presence of viral mutation(s) within the  areas targeted by this assay, and inadequate number of viral copies  (<250 copies / mL). A negative result must be combined with clinical  observations, patient history, and epidemiological information. If result is POSITIVE SARS-CoV-2 target nucleic acids are DETECTED. The SARS-CoV-2 RNA is generally detectable in upper and lower  respiratory specimens dur ing the acute phase of infection.  Positive  results are indicative of active infection with SARS-CoV-2.  Clinical  correlation with patient history and other diagnostic information is  necessary to determine patient infection status.  Positive results do  not rule out bacterial infection or co-infection with other viruses. If result is PRESUMPTIVE POSTIVE SARS-CoV-2 nucleic acids MAY BE PRESENT.   A presumptive positive result was obtained on the submitted specimen  and confirmed on repeat testing.  While 2019 novel coronavirus  (SARS-CoV-2) nucleic acids may be present in the submitted sample  additional  confirmatory testing may be necessary for epidemiological  and / or clinical management purposes  to differentiate between  SARS-CoV-2 and other Sarbecovirus currently known to infect humans.  If clinically indicated additional testing with an alternate test  methodology [phone]) is advised. The SARS-CoV-2 RNA is generally  detectable in upper and lower respiratory sp ecimens during the acute  phase of infection. The expected result is Negative. Fact Sheet for Patients:  StrictlyIdeas.no Fact Sheet for Healthcare Providers: BankingDealers.co.za This test is not yet approved or cleared by the [place] FDA and has been authorized for detection and/or diagnosis of SARS-CoV-2 by FDA under an Emergency Use Authorization (EUA).  This EUA will remain in effect (meaning this test can be used) for the duration of the COVID-19 declaration under Section 564(b)(1) of the Act, 21 U.S.C. section 360bbb-3(b)(1), unless the authorization is terminated or revoked sooner. Performed at [facility],  [place] [address] [place], [place] [address]      Labs: BNP (last 3 results) No results for input(s): BNP in the last 8760 hours. Basic Metabolic Panel: Recent Labs  Lab 01/09/19 0434 01/10/19 0415 01/11/19 0454 01/12/19 2047 01/13/19 0451 01/14/19 0352  NA 135 135 135  --  138 136  K 4.0 3.6 3.7  --  4.3 4.8  CL 110 110 109  --  111 109  CO2 18* 19* 20*  --  20* 20*  GLUCOSE 168* 196* 243* 573* 258* 242*  BUN 9 7* 5*  --  8 16  CREATININE 0.75 0.72 0.69  --  0.78 0.77  CALCIUM 7.5* 7.7* 7.8*  --  7.7* 7.9*   Liver Function Tests: Recent Labs  Lab 01/11/19 0454 01/13/19 0451 01/14/19 0352  AST 7* 15 14*  ALT 9 11 14   ALKPHOS 79 100 111  BILITOT 0.3 0.3 0.1*  PROT 5.1* 5.2* 5.4*  ALBUMIN 2.3* 2.2* 2.4*   No results for input(s): LIPASE, AMYLASE in the last 168 hours. No results for input(s): AMMONIA in the last 168  hours. CBC: Recent Labs  Lab 01/08/19 0423 01/11/19 0454 01/13/19 0451 01/14/19 0352  WBC 13.1* 9.4 7.5 8.2  NEUTROABS 9.8*  --   --   --   HGB 11.5* 11.3* 11.2* 11.2*  HCT 38.9 37.6 38.0 36.9  MCV 85.3 84.5 85.6 82.9  PLT 320 285 308 296   Cardiac Enzymes: No results for input(s): CKTOTAL, CKMB, CKMBINDEX, TROPONINI in the last 168 hours. BNP: Invalid input(s): POCBNP CBG: Recent Labs  Lab 01/13/19 1625 01/13/19 2110 01/13/19 2334 01/14/19 0414 01/14/19 0813  GLUCAP 283* 288* 272* 217* 206*   D-Dimer No results for input(s): DDIMER in the last 72 hours. Hgb A1c No results for input(s): HGBA1C in the last 72 hours. Lipid Profile No results for input(s): CHOL, HDL, LDLCALC, TRIG, CHOLHDL, LDLDIRECT in the last 72 hours. Thyroid function studies No results for input(s): TSH, T4TOTAL, T3FREE, THYROIDAB in the last 72 hours.  Invalid input(s): FREET3 Anemia work up No results for input(s): VITAMINB12, FOLATE, FERRITIN, TIBC, IRON, RETICCTPCT in the last 72 hours. Urinalysis    Component Value Date/Time   COLORURINE YELLOW 01/05/2019 2105   APPEARANCEUR CLEAR 01/05/2019 2105   LABSPEC 1.032 (H) 01/05/2019 2105   PHURINE 5.0 01/05/2019 2105   GLUCOSEU >=500 (A) 01/05/2019 2105   HGBUR NEGATIVE 01/05/2019 2105   BILIRUBINUR NEGATIVE 01/05/2019 2105   KETONESUR 20 (A) 01/05/2019 2105   PROTEINUR NEGATIVE 01/05/2019 2105   UROBILINOGEN 0.2 09/17/2014 1516   NITRITE NEGATIVE 01/05/2019 2105   LEUKOCYTESUR NEGATIVE 01/05/2019 2105   Sepsis Labs Invalid input(s): PROCALCITONIN,  WBC,  LACTICIDVEN Microbiology Recent Results (from the past 240 hour(s))  C difficile quick scan w PCR reflex     Status: None   Collection Time: 01/05/19  9:04 PM   Specimen: STOOL  Result Value Ref Range Status   C Diff antigen NEGATIVE NEGATIVE Final   C Diff toxin NEGATIVE NEGATIVE Final   C Diff interpretation No C. difficile detected.  Final    Comment: Performed at [facility], [place] [address] [place], [place] [address]  SARS Coronavirus 2 [facility] order, Performed in [facility] hospital lab) Nasopharyngeal Nasopharyngeal Swab     Status: None   Collection Time: 01/05/19  9:04 PM   Specimen: Nasopharyngeal Swab  Result Value Ref Range Status   SARS Coronavirus 2 NEGATIVE NEGATIVE Final  Comment: (NOTE) If result is NEGATIVE SARS-CoV-2 target nucleic acids are NOT DETECTED. The SARS-CoV-2 RNA is generally detectable in upper and lower  respiratory specimens during the acute phase of infection. The lowest  concentration of SARS-CoV-2 viral copies this assay can detect is 250  copies / mL. A negative result does not preclude SARS-CoV-2 infection  and should not be used as the sole basis for treatment or other  patient management decisions.  A negative result may occur with  improper specimen collection / handling, submission of specimen other  than nasopharyngeal swab, presence of viral mutation(s) within the  areas targeted by this assay, and inadequate number of viral copies  (<250 copies / mL). A negative result must be combined with clinical  observations, patient history, and epidemiological information. If result is POSITIVE SARS-CoV-2 target nucleic acids are DETECTED. The SARS-CoV-2 RNA is generally detectable in upper and lower  respiratory specimens dur ing the acute phase of infection.  Positive  results are indicative of active infection with SARS-CoV-2.  Clinical  correlation with patient history and other diagnostic information is  necessary to determine patient infection status.  Positive results do  not rule out bacterial infection or co-infection with other viruses. If result is PRESUMPTIVE POSTIVE SARS-CoV-2 nucleic acids MAY BE PRESENT.   A presumptive positive result was obtained on the submitted specimen  and confirmed on repeat testing.  While 2019 novel coronavirus  (SARS-CoV-2) nucleic acids may be present in  the submitted sample  additional confirmatory testing may be necessary for epidemiological  and / or clinical management purposes  to differentiate between  SARS-CoV-2 and other Sarbecovirus currently known to infect humans.  If clinically indicated additional testing with an alternate test  methodology [phone]) is advised. The SARS-CoV-2 RNA is generally  detectable in upper and lower respiratory sp ecimens during the acute  phase of infection. The expected result is Negative. Fact Sheet for Patients:  StrictlyIdeas.no Fact Sheet for Healthcare Providers: BankingDealers.co.za This test is not yet approved or cleared by the [place] FDA and has been authorized for detection and/or diagnosis of SARS-CoV-2 by FDA under an Emergency Use Authorization (EUA).  This EUA will remain in effect (meaning this test can be used) for the duration of the COVID-19 declaration under Section 564(b)(1) of the Act, 21 U.S.C. section 360bbb-3(b)(1), unless the authorization is terminated or revoked sooner. Performed at [facility], [place] [address], [place], [place] [address]    Time spent: 30 min  SIGNED:   Marylu Lund, MD  [facility] 01/14/2019, 11:32 AM  If 7PM-7AM, please contact night-coverage

## 2019-01-14 NOTE — Progress Notes (Addendum)
CSW spoke to admissions person [place] who states pt can come today and is asking that family come to sign paperwork at 3pm.  Pt's daughter in [place] updated.  CSW called pt's son [place] at ph: [phone] and spoke to Judene Companion who stated she will make sure  pt's son is aware and will be there.  [facility] aware and will call with pt's room # and # for report shortly.  CSW will continue to follow for D/C needs.  Alphonse Guild. Raschelle Wisenbaker, LCSW, LCAS, CSI Transitions of Care Clinical Social Worker Care Coordination Department Ph: [phone]

## 2019-01-15 ENCOUNTER — Other Ambulatory Visit: Payer: Self-pay

## 2019-01-15 ENCOUNTER — Encounter (HOSPITAL_COMMUNITY): Payer: Self-pay | Admitting: Gastroenterology

## 2019-01-15 DIAGNOSIS — E039 Hypothyroidism, unspecified: Secondary | ICD-10-CM

## 2019-01-15 LAB — SURGICAL PATHOLOGY

## 2019-01-15 MED ORDER — LEVOTHYROXINE SODIUM 175 MCG PO TABS: 175.0000 ug | ORAL_TABLET | Freq: Every day | ORAL | 1 refills | Status: DC

## 2019-01-17 ENCOUNTER — Telehealth: Payer: Self-pay

## 2019-01-17 DIAGNOSIS — M1710 Unilateral primary osteoarthritis, unspecified knee: Secondary | ICD-10-CM

## 2019-01-17 DIAGNOSIS — M179 Osteoarthritis of knee, unspecified: Secondary | ICD-10-CM

## 2019-01-17 NOTE — Addendum Note (Signed)
Addended by: Nathanial Millman E on: 01/17/2019 01:25 PM   Modules accepted: Orders

## 2019-01-17 NOTE — Telephone Encounter (Signed)
Copied from [place] [id number]. Topic: General - Other >> Jan 17, 2019  9:42 AM Leward Quan A wrote: Reason for CRM: Patient daughter Roxann called to request orders sent to [facility] for PT and OT due to patient not being able to stay at rehab center was sent to [facility] but only lasted 5 hours. Asking for some help ASAP please. Any questions Roxann can be reached at Ph# [phone] or  [phone]

## 2019-01-17 NOTE — Telephone Encounter (Signed)
Sure. Thanks 

## 2019-01-17 NOTE — Telephone Encounter (Signed)
Orders placed. Message sent to [facility] to make them aware.

## 2019-01-18 ENCOUNTER — Other Ambulatory Visit: Payer: Self-pay

## 2019-01-18 ENCOUNTER — Encounter: Payer: Self-pay | Admitting: Family Medicine

## 2019-01-18 ENCOUNTER — Ambulatory Visit (INDEPENDENT_AMBULATORY_CARE_PROVIDER_SITE_OTHER): Payer: Medicare Other | Admitting: Family Medicine

## 2019-01-18 ENCOUNTER — Telehealth: Payer: Self-pay | Admitting: Family Medicine

## 2019-01-18 ENCOUNTER — Other Ambulatory Visit: Payer: Self-pay | Admitting: Family Medicine

## 2019-01-18 VITALS — BP 119/70 | HR 125 | Wt 165.0 lb

## 2019-01-18 DIAGNOSIS — E1165 Type 2 diabetes mellitus with hyperglycemia: Secondary | ICD-10-CM

## 2019-01-18 DIAGNOSIS — I471 Supraventricular tachycardia: Secondary | ICD-10-CM

## 2019-01-18 DIAGNOSIS — F329 Major depressive disorder, single episode, unspecified: Secondary | ICD-10-CM

## 2019-01-18 DIAGNOSIS — I10 Essential (primary) hypertension: Secondary | ICD-10-CM

## 2019-01-18 DIAGNOSIS — F419 Anxiety disorder, unspecified: Secondary | ICD-10-CM | POA: Diagnosis not present

## 2019-01-18 DIAGNOSIS — R4689 Other symptoms and signs involving appearance and behavior: Secondary | ICD-10-CM | POA: Diagnosis not present

## 2019-01-18 DIAGNOSIS — E039 Hypothyroidism, unspecified: Secondary | ICD-10-CM | POA: Diagnosis not present

## 2019-01-18 DIAGNOSIS — Z09 Encounter for follow-up examination after completed treatment for conditions other than malignant neoplasm: Secondary | ICD-10-CM | POA: Diagnosis not present

## 2019-01-18 DIAGNOSIS — R000 Tachycardia, unspecified: Secondary | ICD-10-CM | POA: Diagnosis not present

## 2019-01-18 MED ORDER — LISINOPRIL 10 MG PO TABS: 10.0000 mg | ORAL_TABLET | Freq: Every day | ORAL | 2 refills | Status: DC

## 2019-01-18 MED ORDER — INSULIN DETEMIR 100 UNIT/ML FLEXPEN: 20.0000 [IU] | PEN_INJECTOR | Freq: Every day | SUBCUTANEOUS | 0 refills | Status: DC

## 2019-01-18 MED ORDER — METOPROLOL SUCCINATE ER 50 MG PO TB24: 50.0000 mg | ORAL_TABLET | Freq: Every day | ORAL | 3 refills | Status: DC

## 2019-01-18 MED ORDER — QUETIAPINE FUMARATE 25 MG PO TABS: ORAL_TABLET | ORAL | 2 refills | Status: DC

## 2019-01-18 MED ORDER — LEVOTHYROXINE SODIUM 175 MCG PO TABS: 175.0000 ug | ORAL_TABLET | Freq: Every day | ORAL | 1 refills | Status: DC

## 2019-01-18 MED ORDER — CITALOPRAM HYDROBROMIDE 10 MG PO TABS: ORAL_TABLET | ORAL | 1 refills | Status: DC

## 2019-01-18 MED ORDER — INSULIN ASPART 100 UNIT/ML FLEXPEN: PEN_INJECTOR | SUBCUTANEOUS | 11 refills | Status: DC

## 2019-01-18 NOTE — Progress Notes (Signed)
Established Patient Office Visit  Subjective:  Patient ID: Leslie Benton, female    DOB: 04-06-1941  Age: 78 y.o. MRN: [id number]  CC:  Chief Complaint  Patient presents with  . Follow-up    Release from hospital    HPI Leslie Benton presents for for hospital discharge follow-up status post inpatient treatment for noninfectious colitis.  Patient was discharged to extended care but demanded to be released to home after being there for 5 hours.  Patient is accompanied by her daughter Leslie Benton today.  Patient was discharged on a 4-week taper of prednisone, 40 daily for 7 days, 30 daily for 7 days, 20 daily for 7 days, 10 q. days for 7 days and stop.  She has not started this taper.  Patient's daughter Leslie Benton is concerned about patient's depression and anxiety and I agree strongly with her concern.  Patient was discharged with a prescription for amiodarone but has not started it.  This drug interacts with medication use to treat the patient's depression and anxiety.  She has not been taking her metoprolol tartrate for the last several days.  She has follow-up scheduled with GI.  Past Medical History:  Diagnosis Date  . Anemia   . Anxiety   . Cervical spondylosis without myelopathy 05/29/2015  . COLONIC POLYPS, HX OF 02/07/2008  . DEGENERATIVE JOINT DISEASE 10/14/2006   03/31/11: Pt states arthritis in her hip.  . Depression   . DIABETES MELLITUS, TYPE II 10/27/2009  . GERD 10/14/2006  . HYPERLIPIDEMIA 10/14/2006  . HYPERTENSION 10/14/2006   off bp meds now  . HYPOTHYROIDISM 10/14/2006  . LOW BACK PAIN 08/04/2007  . Obesity     Past Surgical History:  Procedure Laterality Date  . BIOPSY  01/12/2019   Procedure: BIOPSY;  Surgeon: Otis Brace, MD;  Location: [facility];  Service: Gastroenterology;;  . Otho Darner SIGMOIDOSCOPY N/A 01/12/2019   Procedure: FLEXIBLE SIGMOIDOSCOPY;  Surgeon: Otis Brace, MD;  Location: [facility];  Service: Gastroenterology;  Laterality: N/A;  .  HEMORRHOID SURGERY    . Left knee arthroscopic  April 2014   Dr. Durward Fortes  . PARTIAL KNEE ARTHROPLASTY Left 09/23/2014   Procedure: LEFT UNICOMPARTMENTAL KNEE;  Surgeon: [name], MD;  Location: [facility];  Service: Orthopedics;  Laterality: Left;  . TONSILLECTOMY    . TUBAL LIGATION      Family History  Problem Relation Age of Onset  . Heart attack Father 79  . Diabetes Father   . Kidney failure Mother   . Cancer Sister        endometrial  . Cancer Brother        Adrenal    Social History   Socioeconomic History  . Marital status: Married    Spouse name: Not on file  . Number of children: 2  . Years of education: college  . Highest education level: Not on file  Occupational History  . Occupation: retired  Scientific laboratory technician  . Financial resource strain: Not on file  . Food insecurity    Worry: Not on file    Inability: Not on file  . Transportation needs    Medical: Not on file    Non-medical: Not on file  Tobacco Use  . Smoking status: Never Smoker  . Smokeless tobacco: Never Used  . Tobacco comment: Never Used Tobacco  Substance and Sexual Activity  . Alcohol use: No    Alcohol/week: 0.0 standard drinks  . Drug use: No  . Sexual activity: Not on file  Lifestyle  . Physical activity    Days per week: Not on file    Minutes per session: Not on file  . Stress: Not on file  Relationships  . Social Herbalist on phone: Not on file    Gets together: Not on file    Attends religious service: Not on file    Active member of club or organization: Not on file    Attends meetings of clubs or organizations: Not on file    Relationship status: Not on file  . Intimate partner violence    Fear of current or ex partner: Not on file    Emotionally abused: Not on file    Physically abused: Not on file    Forced sexual activity: Not on file  Other Topics Concern  . Not on file  Social History Narrative   Lives with husband and son.     Patient drinks 1-2 cups  of caffeine daily.   Patient is left handed.     Outpatient Medications Prior to Visit  Medication Sig Dispense Refill  . Blood Glucose Monitoring Suppl (ONE TOUCH ULTRA 2) w/Device KIT Use to test blood sugar 1-2 times daily. 1 kit 0  . fluticasone (FLONASE) 50 MCG/ACT nasal spray SHAKE LIQUID AND USE 2 SPRAYS IN EACH NOSTRIL DAILY (Patient taking differently: Place 2 sprays into both nostrils daily. ) 16 g 2  . glucose blood (ONETOUCH ULTRA) test strip Use to test blood sugars 1-2 times daily. 100 each 12  . ibuprofen (ADVIL,MOTRIN) 200 MG tablet Take 400 mg by mouth as needed for headache or moderate pain.     Marland Kitchen insulin aspart (NOVOLOG) 100 UNIT/ML FlexPen Inject 8 Units into the skin 3 (three) times daily with meals. 15 mL 0  . Lancets (ONETOUCH ULTRASOFT) lancets Use to test blood sugars 1-2 times daily. 100 each 12  . loratadine (CLARITIN) 10 MG tablet Take 10 mg by mouth daily as needed for allergies or rhinitis.     Marland Kitchen mesalamine (CANASA) 1000 MG suppository Place 1 suppository (1,000 mg total) rectally 2 (two) times daily. 60 suppository 0  . mesalamine (PENTASA) 250 MG CR capsule Take 4 capsules (1,000 mg total) by mouth 4 (four) times daily. 480 capsule 0  . metoprolol tartrate (LOPRESSOR) 25 MG tablet Take 1 tablet (25 mg total) by mouth 4 (four) times daily. 120 tablet 0  . predniSONE (DELTASONE) 10 MG tablet Taper dose: 40 mg p.o. daily for 1 week, then 30 mg p.o. daily for 1 week, and 20 mg p.o. daily for 1 week, then 10 mg p.o. daily for 1 week then discontinue, zero refills    . amiodarone (PACERONE) 200 MG tablet Take 1 tablet (200 mg total) by mouth daily. 30 tablet 0  . busPIRone (BUSPAR) 15 MG tablet Take 1 tablet (15 mg total) by mouth daily. (Patient taking differently: Take 15 mg by mouth daily as needed (depression). ) 180 tablet 3  . insulin aspart (NOVOLOG) 100 UNIT/ML FlexPen Sliding scale: CBG < 70: Implement Hypoglycemia Standing Orders and refer to Hypoglycemia  Standing Orders sidebar report  CBG 70 - 120: 0 units  CBG 121 - 150: 2 units  CBG 151 - 200: 3 units  CBG 201 - 250: 5 units  CBG 251 - 300: 8 units  CBG 301 - 350: 11 units  CBG 351 - 400: 15 units  CBG > 400 Notify facility MD 15 mL 11  . Insulin Detemir (LEVEMIR) 100  UNIT/ML Pen Inject 20 Units into the skin daily. 15 mL 0  . levothyroxine (SYNTHROID) 175 MCG tablet Take 1 tablet (175 mcg total) by mouth daily before breakfast. 90 tablet 1  . lisinopril (ZESTRIL) 10 MG tablet Take 1 tablet (10 mg total) by mouth daily. 30 tablet 2  . phentermine 37.5 MG capsule Take 1 capsule (37.5 mg total) by mouth as needed. 90 capsule 0   No facility-administered medications prior to visit.     Allergies  Allergen Reactions  . Niacin And Related Hives and Swelling    No anaphalaxis, but strong reactions.  . Lactose Intolerance (Gi)     "bad taste in mouth"  . Amoxicillin Swelling    hives  . Lactase   . Penicillins Swelling    Hives With all cillins    ROS Review of Systems  Constitutional: Negative.   HENT: Negative.   Eyes: Negative for photophobia and visual disturbance.  Respiratory: Negative.  Negative for chest tightness and shortness of breath.   Cardiovascular: Positive for palpitations. Negative for chest pain.  Gastrointestinal: Positive for diarrhea. Negative for nausea and vomiting.  Endocrine: Negative for polyphagia and polyuria.  Genitourinary: Negative.   Musculoskeletal: Negative for arthralgias and myalgias.  Skin: Negative for pallor and rash.  Neurological: Negative for light-headedness and numbness.  Psychiatric/Behavioral: Positive for dysphoric mood. The patient is nervous/anxious.       Objective:    Physical Exam  Constitutional: She is oriented to person, place, and time. She appears well-developed and well-nourished. No distress.  HENT:  Head: Normocephalic and atraumatic.  Right Ear: External ear normal.  Left Ear: External ear normal.  Eyes:  Right eye exhibits no discharge. Left eye exhibits no discharge. No scleral icterus.  Neck: No tracheal deviation present.  Pulmonary/Chest: Effort normal. No stridor.  Neurological: She is alert and oriented to person, place, and time.  Skin: Skin is dry. She is not diaphoretic.  Psychiatric: She has a normal mood and affect. Her behavior is normal.    BP 119/70   Pulse (!) 125   Wt 165 lb (74.8 kg)   SpO2 97%   BMI 29.23 kg/m  Wt Readings from Last 3 Encounters:  01/18/19 165 lb (74.8 kg)  01/12/19 164 lb 14.5 oz (74.8 kg)  12/19/18 165 lb (74.8 kg)     Health Maintenance Due  Topic Date Due  . DEXA SCAN  04/17/2004  . OPHTHALMOLOGY EXAM  05/03/2015    There are no preventive care reminders to display for this patient.  Lab Results  Component Value Date   TSH 0.789 01/06/2019   Lab Results  Component Value Date   WBC 8.2 01/14/2019   HGB 11.2 (L) 01/14/2019   HCT 36.9 01/14/2019   MCV 82.9 01/14/2019   PLT 296 01/14/2019   Lab Results  Component Value Date   NA 136 01/14/2019   K 4.8 01/14/2019   CO2 20 (L) 01/14/2019   GLUCOSE 242 (H) 01/14/2019   BUN 16 01/14/2019   CREATININE 0.77 01/14/2019   BILITOT 0.1 (L) 01/14/2019   ALKPHOS 111 01/14/2019   AST 14 (L) 01/14/2019   ALT 14 01/14/2019   PROT 5.4 (L) 01/14/2019   ALBUMIN 2.4 (L) 01/14/2019   CALCIUM 7.9 (L) 01/14/2019   ANIONGAP 7 01/14/2019   GFR 71.16 10/11/2018   Lab Results  Component Value Date   CHOL 282 (H) 08/05/2015   Lab Results  Component Value Date   HDL 54.10 08/05/2015  Lab Results  Component Value Date   LDLCALC 111 (H) 07/03/2014   Lab Results  Component Value Date   TRIG 307.0 (H) 08/05/2015   Lab Results  Component Value Date   CHOLHDL 5 08/05/2015   Lab Results  Component Value Date   HGBA1C 11.6 (H) 10/11/2018      Assessment & Plan:   Problem List Items Addressed This Visit      Cardiovascular and Mediastinum   Essential hypertension   Relevant  Medications   lisinopril (ZESTRIL) 10 MG tablet   metoprolol succinate (TOPROL-XL) 50 MG 24 hr tablet   SVT (supraventricular tachycardia) (HCC)   Relevant Medications   lisinopril (ZESTRIL) 10 MG tablet   metoprolol succinate (TOPROL-XL) 50 MG 24 hr tablet     Endocrine   Hypothyroidism   Relevant Medications   levothyroxine (SYNTHROID) 175 MCG tablet   metoprolol succinate (TOPROL-XL) 50 MG 24 hr tablet   Uncontrolled type 2 diabetes mellitus with hyperglycemia (HCC) - Primary   Relevant Medications   lisinopril (ZESTRIL) 10 MG tablet   Insulin Detemir (LEVEMIR) 100 UNIT/ML Pen   insulin aspart (NOVOLOG) 100 UNIT/ML FlexPen     Other   Depression   Relevant Medications   QUEtiapine (SEROQUEL) 25 MG tablet   citalopram (CELEXA) 10 MG tablet   Tachycardia   Relevant Medications   metoprolol succinate (TOPROL-XL) 50 MG 24 hr tablet   Other Relevant Orders   Ambulatory referral to Cardiology   Anxiety   Relevant Medications   QUEtiapine (SEROQUEL) 25 MG tablet   citalopram (CELEXA) 10 MG tablet   Non-compliant behavior   Hospital discharge follow-up      Meds ordered this encounter  Medications  . lisinopril (ZESTRIL) 10 MG tablet    Sig: Take 1 tablet (10 mg total) by mouth daily.    Dispense:  30 tablet    Refill:  2  . levothyroxine (SYNTHROID) 175 MCG tablet    Sig: Take 1 tablet (175 mcg total) by mouth daily before breakfast.    Dispense:  90 tablet    Refill:  1  . Insulin Detemir (LEVEMIR) 100 UNIT/ML Pen    Sig: Inject 20 Units into the skin daily.    Dispense:  15 mL    Refill:  0  . insulin aspart (NOVOLOG) 100 UNIT/ML FlexPen    Sig: Sliding scale: CBG < 70: Implement Hypoglycemia Standing Orders and refer to Hypoglycemia Standing Orders sidebar report  CBG 70 - 120: 0 units  CBG 121 - 150: 2 units  CBG 151 - 200: 3 units  CBG 201 - 250: 5 units  CBG 251 - 300: 8 units  CBG 301 - 350: 11 units  CBG 351 - 400: 15 units  CBG > 400 Notify facility  MD    Dispense:  15 mL    Refill:  11  . QUEtiapine (SEROQUEL) 25 MG tablet    Sig: Take one to two at night as needed.    Dispense:  60 tablet    Refill:  2  . citalopram (CELEXA) 10 MG tablet    Sig: Take one daily for one week and then increase to 2 daily.    Dispense:  60 tablet    Refill:  1  . metoprolol succinate (TOPROL-XL) 50 MG 24 hr tablet    Sig: Take 1 tablet (50 mg total) by mouth daily. Take with or immediately following a meal.    Dispense:  90 tablet  Refill:  3    Follow-up: Return in about 1 month (around 02/18/2019).   I spent over 30 minutes with this patient speaking with her and her daughter with greater than 50% of the time spent in counseling.  Visiting home health will start soon for this patient.  Will need to follow-up in a month to check on her progress with the Celexa and the Seroquel.  Elevated heart rate today is due to the fact that she has missed her metoprolol over the last several days.  We will start her on daily metoprolol succinate 50 mg daily.  Referred to cardiology for alternative treatment for PSVT other than amiodarone.  Psychotropic medications for this patient are up upmost importance.  [name], MD

## 2019-01-18 NOTE — Telephone Encounter (Addendum)
Patient's daughter, Lupita Dawn, said the provider was going to write a prescription for predniSONE (DELTASONE) 10 MG tablet and send it to the patient's preferred pharmacy [facility] on [address].

## 2019-01-18 NOTE — Telephone Encounter (Signed)
Pt was just seen and says that PCP was suppose to call in a steroid medication for pt. Pharmacy is stating that they have not received Rx.    Please assist  Pharmacy: [id number] - [place], [place]

## 2019-01-19 ENCOUNTER — Telehealth: Payer: Self-pay | Admitting: Family Medicine

## 2019-01-19 DIAGNOSIS — M1710 Unilateral primary osteoarthritis, unspecified knee: Secondary | ICD-10-CM | POA: Diagnosis not present

## 2019-01-19 DIAGNOSIS — M47812 Spondylosis without myelopathy or radiculopathy, cervical region: Secondary | ICD-10-CM | POA: Diagnosis not present

## 2019-01-19 DIAGNOSIS — F419 Anxiety disorder, unspecified: Secondary | ICD-10-CM | POA: Diagnosis not present

## 2019-01-19 DIAGNOSIS — F329 Major depressive disorder, single episode, unspecified: Secondary | ICD-10-CM | POA: Diagnosis not present

## 2019-01-19 DIAGNOSIS — E119 Type 2 diabetes mellitus without complications: Secondary | ICD-10-CM | POA: Diagnosis not present

## 2019-01-19 DIAGNOSIS — M545 Low back pain: Secondary | ICD-10-CM | POA: Diagnosis not present

## 2019-01-19 DIAGNOSIS — R197 Diarrhea, unspecified: Secondary | ICD-10-CM | POA: Diagnosis not present

## 2019-01-19 DIAGNOSIS — I10 Essential (primary) hypertension: Secondary | ICD-10-CM | POA: Diagnosis not present

## 2019-01-19 DIAGNOSIS — Z794 Long term (current) use of insulin: Secondary | ICD-10-CM | POA: Diagnosis not present

## 2019-01-19 DIAGNOSIS — I471 Supraventricular tachycardia: Secondary | ICD-10-CM | POA: Diagnosis not present

## 2019-01-19 DIAGNOSIS — I700 Atherosclerosis of aorta: Secondary | ICD-10-CM | POA: Diagnosis not present

## 2019-01-19 DIAGNOSIS — Z7984 Long term (current) use of oral hypoglycemic drugs: Secondary | ICD-10-CM | POA: Diagnosis not present

## 2019-01-19 MED ORDER — PREDNISONE 10 MG PO TABS: ORAL_TABLET | ORAL | Status: DC

## 2019-01-19 MED ORDER — PREDNISONE 10 MG PO TABS: ORAL_TABLET | ORAL | 0 refills | Status: DC

## 2019-01-19 NOTE — Telephone Encounter (Signed)
Yes, I tried to send it again yesterday.

## 2019-01-19 NOTE — Telephone Encounter (Signed)
Rx sent 

## 2019-01-19 NOTE — Telephone Encounter (Addendum)
Caller name: Mr.David /  [facility] Nurse (sister) Relation to pt: Nephew  -Call back number: [phone] / [phone]   Reason for call:  Requesting orders for Skilled nursing and home nurse orders, please send to [facility]. (requesting orders sent today)

## 2019-01-19 NOTE — Telephone Encounter (Signed)
Requested medication (s) are due for refill today: no  Requested medication (s) are on the active medication list: yes  Last refill:  01/05/2019  Future visit scheduled:yes   Notes to clinic:  Refill cannot be delegated    Requested Prescriptions  Pending Prescriptions Disp Refills   predniSONE (DELTASONE) 10 MG tablet       Sig: Taper dose: 40 mg p.o. daily for 1 week, then 30 mg p.o. daily for 1 week, and 20 mg p.o. daily for 1 week, then 10 mg p.o. daily for 1 week then discontinue, zero refills     Not Delegated - Endocrinology:  Oral Corticosteroids Failed - 01/18/2019  6:12 PM      Failed - This refill cannot be delegated      Passed - Last BP in normal range    BP Readings from Last 1 Encounters:  01/18/19 119/70         Passed - Valid encounter within last 6 months    Recent Outpatient Visits          Yesterday Uncontrolled type 2 diabetes mellitus with hyperglycemia ([place])   [facility] [place], Mortimer Fries, MD   1 month ago Essential hypertension   [facility] Ethelene Hal, Mortimer Fries, MD   3 months ago Uncontrolled type 2 diabetes mellitus with hyperglycemia [facility])   [facility] [place], Mortimer Fries, MD   5 months ago Post-nasal drip   [facility] [place], William Alfred, MD   6 months ago Amphetamine and psychostimulant dependence [facility])   [facility] [place], Mortimer Fries, MD      Future Appointments            In 1 month Ethelene Hal, Mortimer Fries, MD [facility], [facility]

## 2019-01-19 NOTE — Addendum Note (Signed)
Addended by: Rodrigo Ran on: 01/19/2019 08:42 AM   Modules accepted: Orders

## 2019-01-19 NOTE — Telephone Encounter (Signed)
The ED never sent the Rx in, it says "no print" which means it wasn't sent. Okay to send?

## 2019-01-19 NOTE — Telephone Encounter (Signed)
It looks like the ED never sent the prescription in for her, it says "no print". Okay to resend?

## 2019-01-22 NOTE — Telephone Encounter (Signed)
This has already been completed. 

## 2019-01-23 ENCOUNTER — Ambulatory Visit: Payer: Medicare Other | Admitting: Family Medicine

## 2019-01-26 ENCOUNTER — Ambulatory Visit: Payer: Self-pay | Admitting: *Deleted

## 2019-01-26 ENCOUNTER — Telehealth: Payer: Self-pay | Admitting: Family Medicine

## 2019-01-26 DIAGNOSIS — E1165 Type 2 diabetes mellitus with hyperglycemia: Secondary | ICD-10-CM

## 2019-01-26 NOTE — Telephone Encounter (Signed)
Patient daughter is calling and would like for the nurse or Dr. Ethelene Hal to return call. Patient daughter Lupita Dawn is calling about the insulin that was prescribed to her mother. Patient is refusing to take the insulin and they are requesting pills or a insulin pump. Please call patient daughter @ [phone].

## 2019-01-26 NOTE — Telephone Encounter (Signed)
Astonia, RN called from [place] with a pt having a blood sugar of 449. She is not with the patient but the caregiver called and stated she had given the patient 15 units of the Novolog insulin. But had not been given her regular schedule dose of insulin for this meal. The RN called the caregiver who stated she had tried to give her the regular insulin but the needle was not working. She also stated that the caregiver before her had given the patient her morning insulin but she did not have a recorded blood sugar for her. Astonia,RN stated that she would go and assess the patient and the mechanism of the needle.  [facility] notified regarding pt's blood sugar.     Reason for Disposition . Blood glucose > 400 mg/dL (22.2 mmol/L)  Answer Assessment - Initial Assessment Questions 1. BLOOD GLUCOSE: "What is your blood glucose level?"      449 2. ONSET: "When did you check the blood glucose?"     now 3. USUAL RANGE: "What is your glucose level usually?" (e.g., usual fasting morning value, usual evening value)     *No Answer* 4. KETONES: "Do you check for ketones (urine or blood test strips)?" If yes, ask: "What does the test show now?"      *No Answer* 5. TYPE 1 or 2:  "Do you know what type of diabetes you have?"  (e.g., Type 1, Type 2, Gestational; doesn't know)      *No Answer* 6. INSULIN: "Do you take insulin?" "What type of insulin(s) do you use? What is the mode of delivery? (syringe, pen; injection or pump)?"      Yes 7. DIABETES PILLS: "Do you take any pills for your diabetes?" If yes, ask: "Have you missed taking any pills recently?"     *No Answer* 8. OTHER SYMPTOMS: "Do you have any symptoms?" (e.g., fever, frequent urination, difficulty breathing, dizziness, weakness, vomiting)     *No Answer* 9. PREGNANCY: "Is there any chance you are pregnant?" "When was your last menstrual period?"     *No Answer*  Protocols used: DIABETES - HIGH BLOOD SUGAR-A-AH

## 2019-01-26 NOTE — Telephone Encounter (Signed)
Okay 

## 2019-01-26 NOTE — Telephone Encounter (Signed)
If she won't take the insulin, I don't think she's going to allow an insulin pump either.

## 2019-01-26 NOTE — Telephone Encounter (Signed)
FYI - Leslie Benton is going to go back and assess the pt & the mechanism of the needle.

## 2019-01-29 NOTE — Telephone Encounter (Signed)
I spoke with Roxanne. Pt is willing to try the metformin again and pt's daughter will take pt to diabetic teaching to make sure she goes, okay to place that referral?

## 2019-01-29 NOTE — Telephone Encounter (Signed)
Yes please

## 2019-01-30 MED ORDER — METFORMIN HCL 1000 MG PO TABS: 1000.0000 mg | ORAL_TABLET | Freq: Two times a day (BID) | ORAL | 3 refills | Status: DC

## 2019-01-30 NOTE — Telephone Encounter (Signed)
Rx sent in for Metformin to the [facility] [address] as requested. Referral placed for diabetic teaching as pt's daughter requested.

## 2019-02-01 DIAGNOSIS — K51811 Other ulcerative colitis with rectal bleeding: Secondary | ICD-10-CM | POA: Diagnosis not present

## 2019-02-05 ENCOUNTER — Telehealth: Payer: Self-pay | Admitting: Family Medicine

## 2019-02-05 NOTE — Telephone Encounter (Signed)
Copied from [place] [phone]. Topic: Quick Communication - Home Health Verbal Orders >> Feb 05, 2019  4:38 PM Yvette Rack wrote: Caller/Agency: Laurey Arrow with [facility] Callback Number: [phone] Requesting OT/PT/Skilled Nursing/Social Work/Speech Therapy: requests to add skilled nursing for diabetic management

## 2019-02-06 NOTE — Telephone Encounter (Signed)
Okay for verbal 

## 2019-02-06 NOTE — Telephone Encounter (Signed)
Yes, please.

## 2019-02-06 NOTE — Telephone Encounter (Signed)
lmovm for pete letting him know verbal is okay & to call back with any questions.

## 2019-02-09 ENCOUNTER — Other Ambulatory Visit: Payer: Self-pay

## 2019-02-09 ENCOUNTER — Emergency Department (HOSPITAL_COMMUNITY)
Admission: EM | Admit: 2019-02-09 | Discharge: 2019-02-09 | Disposition: A | Discharge status: Home / Self Care | Payer: Medicare Other | Attending: Emergency Medicine | Admitting: Emergency Medicine

## 2019-02-09 ENCOUNTER — Encounter (HOSPITAL_COMMUNITY): Payer: Self-pay

## 2019-02-09 ENCOUNTER — Emergency Department (HOSPITAL_COMMUNITY): Payer: Medicare Other

## 2019-02-09 DIAGNOSIS — E1165 Type 2 diabetes mellitus with hyperglycemia: Secondary | ICD-10-CM | POA: Diagnosis not present

## 2019-02-09 DIAGNOSIS — S0990XA Unspecified injury of head, initial encounter: Secondary | ICD-10-CM | POA: Diagnosis not present

## 2019-02-09 DIAGNOSIS — Y92003 Bedroom of unspecified non-institutional (private) residence as the place of occurrence of the external cause: Secondary | ICD-10-CM | POA: Insufficient documentation

## 2019-02-09 DIAGNOSIS — R0902 Hypoxemia: Secondary | ICD-10-CM | POA: Diagnosis not present

## 2019-02-09 DIAGNOSIS — E039 Hypothyroidism, unspecified: Secondary | ICD-10-CM | POA: Insufficient documentation

## 2019-02-09 DIAGNOSIS — I10 Essential (primary) hypertension: Secondary | ICD-10-CM | POA: Insufficient documentation

## 2019-02-09 DIAGNOSIS — Z794 Long term (current) use of insulin: Secondary | ICD-10-CM | POA: Diagnosis not present

## 2019-02-09 DIAGNOSIS — G4489 Other headache syndrome: Secondary | ICD-10-CM | POA: Diagnosis not present

## 2019-02-09 DIAGNOSIS — M542 Cervicalgia: Secondary | ICD-10-CM | POA: Insufficient documentation

## 2019-02-09 DIAGNOSIS — W19XXXA Unspecified fall, initial encounter: Secondary | ICD-10-CM

## 2019-02-09 DIAGNOSIS — S0033XA Contusion of nose, initial encounter: Secondary | ICD-10-CM | POA: Diagnosis not present

## 2019-02-09 DIAGNOSIS — Z79899 Other long term (current) drug therapy: Secondary | ICD-10-CM | POA: Insufficient documentation

## 2019-02-09 DIAGNOSIS — E119 Type 2 diabetes mellitus without complications: Secondary | ICD-10-CM | POA: Insufficient documentation

## 2019-02-09 DIAGNOSIS — S199XXA Unspecified injury of neck, initial encounter: Secondary | ICD-10-CM | POA: Diagnosis not present

## 2019-02-09 DIAGNOSIS — S0992XA Unspecified injury of nose, initial encounter: Secondary | ICD-10-CM | POA: Diagnosis present

## 2019-02-09 DIAGNOSIS — Y9389 Activity, other specified: Secondary | ICD-10-CM | POA: Insufficient documentation

## 2019-02-09 DIAGNOSIS — S0993XA Unspecified injury of face, initial encounter: Secondary | ICD-10-CM | POA: Diagnosis not present

## 2019-02-09 DIAGNOSIS — R58 Hemorrhage, not elsewhere classified: Secondary | ICD-10-CM | POA: Diagnosis not present

## 2019-02-09 DIAGNOSIS — E782 Mixed hyperlipidemia: Secondary | ICD-10-CM | POA: Insufficient documentation

## 2019-02-09 DIAGNOSIS — R52 Pain, unspecified: Secondary | ICD-10-CM | POA: Diagnosis not present

## 2019-02-09 DIAGNOSIS — S0083XA Contusion of other part of head, initial encounter: Secondary | ICD-10-CM

## 2019-02-09 DIAGNOSIS — Y999 Unspecified external cause status: Secondary | ICD-10-CM | POA: Insufficient documentation

## 2019-02-09 DIAGNOSIS — W010XXA Fall on same level from slipping, tripping and stumbling without subsequent striking against object, initial encounter: Secondary | ICD-10-CM | POA: Insufficient documentation

## 2019-02-09 MED ORDER — FREESTYLE LIBRE 14 DAY SENSOR MISC: 1.0000 | Freq: Every day | 3 refills | Status: DC

## 2019-02-09 MED ORDER — FREESTYLE LIBRE 14 DAY READER DEVI: 1.0000 | Freq: Every day | 3 refills | Status: DC

## 2019-02-09 MED ORDER — MORPHINE SULFATE (PF) 4 MG/ML IV SOLN
4.0000 mg | Freq: Once | INTRAVENOUS | Status: AC
  Administered 2019-02-09: 4 mg via INTRAVENOUS
  Filled 2019-02-09: qty 1

## 2019-02-09 NOTE — ED Provider Notes (Signed)
[facility] EMERGENCY DEPARTMENT Provider Note   CSN: [id number] Arrival date & time: 02/09/19  1701     History   Chief Complaint Chief Complaint  Patient presents with   Fall    HPI Leslie Benton is a 78 y.o. female.     HPI   78 year old female presenting after fall.  She states she lost her balance.  She did strike her head.  No loss of consciousness.  She is complaining of nasal pain and some neck pain.  She is not anticoagulated.  No acute visual changes.  No nausea.  No acute neurological complaints.  Past Medical History:  Diagnosis Date   Anemia    Anxiety    Cervical spondylosis without myelopathy 05/29/2015   COLONIC POLYPS, HX OF 02/07/2008   DEGENERATIVE JOINT DISEASE 10/14/2006   03/31/11: Pt states arthritis in her hip.   Depression    DIABETES MELLITUS, TYPE II 10/27/2009   GERD 10/14/2006   HYPERLIPIDEMIA 10/14/2006   HYPERTENSION 10/14/2006   off bp meds now   HYPOTHYROIDISM 10/14/2006   LOW BACK PAIN 08/04/2007   Obesity     Patient Active Problem List   Diagnosis Date Noted   Hospital discharge follow-up 01/18/2019   SVT (supraventricular tachycardia) (HCC)    Elevated troponin 01/06/2019   Diarrhea 01/05/2019   Non-compliant behavior 12/20/2018   Need for influenza vaccination 12/20/2018   Post-nasal drip 07/24/2018   Anxiety 07/04/2018   Uncontrolled type 2 diabetes mellitus with hyperglycemia ([place]) 06/06/2018   Chronic fatigue 01/30/2018   Dyslipidemia 10/17/2017   Acute eczematoid otitis externa of left ear 07/26/2017   Fungal dermatitis 07/26/2017   Cervical spondylosis without myelopathy 05/29/2015   Cervicogenic headache 05/29/2015   Obesity 01/14/2015   OA (osteoarthritis) of knee 09/23/2014   Precordial chest pain 08/08/2014   Tachycardia    Anemia 07/02/2014   Depression 06/08/2012   KNEE PAIN, RIGHT 07/15/2008   COLONIC POLYPS, HX OF 02/07/2008   LOW BACK PAIN 08/04/2007     Hypothyroidism 10/14/2006   Essential hypertension 10/14/2006   GERD 10/14/2006   Osteoarthritis 10/14/2006    Past Surgical History:  Procedure Laterality Date   BIOPSY  01/12/2019   Procedure: BIOPSY;  Surgeon: Otis Brace, MD;  Location: [facility];  Service: Gastroenterology;;   Otho Darner SIGMOIDOSCOPY N/A 01/12/2019   Procedure: FLEXIBLE SIGMOIDOSCOPY;  Surgeon: Otis Brace, MD;  Location: [facility];  Service: Gastroenterology;  Laterality: N/A;   HEMORRHOID SURGERY     Left knee arthroscopic  April 2014   Dr. Durward Fortes   PARTIAL KNEE ARTHROPLASTY Left 09/23/2014   Procedure: LEFT UNICOMPARTMENTAL KNEE;  Surgeon: [name], MD;  Location: [facility] ORS;  Service: Orthopedics;  Laterality: Left;   TONSILLECTOMY     TUBAL LIGATION       OB History   No obstetric history on file.      Home Medications    Prior to Admission medications   Medication Sig Start Date End Date Taking? Authorizing Provider  Blood Glucose Monitoring Suppl (ONE TOUCH ULTRA 2) w/Device KIT Use to test blood sugar 1-2 times daily. 10/16/18   [name], MD  citalopram (CELEXA) 10 MG tablet Take one daily for one week and then increase to 2 daily. 01/18/19   [name], MD  Continuous Blood Gluc Receiver (FREESTYLE LIBRE 14 DAY READER) DEVI 1 each by Does not apply route daily. Use as directed on box. 02/09/19   [name], MD  Continuous  Blood Gluc Sensor (FREESTYLE LIBRE 14 DAY SENSOR) MISC 1 each by Does not apply route daily. Use as directed. 02/09/19   [name], MD  fluticasone (FLONASE) 50 MCG/ACT nasal spray SHAKE LIQUID AND USE 2 SPRAYS IN EACH NOSTRIL DAILY Patient taking differently: Place 2 sprays into both nostrils daily.  10/19/18   [name], MD  glucose blood [facility] ULTRA) test strip Use to test blood sugars 1-2 times daily. 10/16/18   [name], MD  ibuprofen (ADVIL,MOTRIN) 200 MG tablet Take  400 mg by mouth as needed for headache or moderate pain.     [provider]  insulin aspart (NOVOLOG) 100 UNIT/ML FlexPen Inject 8 Units into the skin 3 (three) times daily with meals. 01/14/19   Donne Hazel, MD  insulin aspart (NOVOLOG) 100 UNIT/ML FlexPen Sliding scale: CBG < 70: Implement Hypoglycemia Standing Orders and refer to Hypoglycemia Standing Orders sidebar report  CBG 70 - 120: 0 units  CBG 121 - 150: 2 units  CBG 151 - 200: 3 units  CBG 201 - 250: 5 units  CBG 251 - 300: 8 units  CBG 301 - 350: 11 units  CBG 351 - 400: 15 units  CBG > 400 Notify facility MD 01/18/19   [name], MD  Insulin Detemir (LEVEMIR) 100 UNIT/ML Pen Inject 20 Units into the skin daily. 01/18/19   [name], MD  Lancets [facility] ULTRASOFT) lancets Use to test blood sugars 1-2 times daily. 10/16/18   [name], MD  levothyroxine (SYNTHROID) 175 MCG tablet Take 1 tablet (175 mcg total) by mouth daily before breakfast. 01/18/19   [name], MD  lisinopril (ZESTRIL) 10 MG tablet Take 1 tablet (10 mg total) by mouth daily. 01/18/19   [name], MD  loratadine (CLARITIN) 10 MG tablet Take 10 mg by mouth daily as needed for allergies or rhinitis.     [provider]  mesalamine (CANASA) 1000 MG suppository Place 1 suppository (1,000 mg total) rectally 2 (two) times daily. 01/14/19 02/13/19  Donne Hazel, MD  mesalamine (PENTASA) 250 MG CR capsule Take 4 capsules (1,000 mg total) by mouth 4 (four) times daily. 01/14/19 02/13/19  Donne Hazel, MD  metFORMIN (GLUCOPHAGE) 1000 MG tablet Take 1 tablet (1,000 mg total) by mouth 2 (two) times daily with a meal. 01/30/19   [name], MD  metoprolol succinate (TOPROL-XL) 50 MG 24 hr tablet Take 1 tablet (50 mg total) by mouth daily. Take with or immediately following a meal. 01/18/19   [name], MD  metoprolol tartrate (LOPRESSOR) 25 MG tablet Take 1 tablet (25  mg total) by mouth 4 (four) times daily. 01/14/19 02/13/19  Donne Hazel, MD  predniSONE (DELTASONE) 10 MG tablet Taper dose: 40 mg p.o. daily for 1 week, then 30 mg p.o. daily for 1 week, and 20 mg p.o. daily for 1 week, then 10 mg p.o. daily for 1 week then discontinue, zero refills 01/19/19   [name], MD  QUEtiapine (SEROQUEL) 25 MG tablet Take one to two at night as needed. 01/18/19   [name], MD    Family History Family History  Problem Relation Age of Onset   Heart attack Father 24   Diabetes Father    Kidney failure Mother    Cancer Sister        endometrial   Cancer Brother        Adrenal  Social History Social History   Tobacco Use   Smoking status: Never Smoker   Smokeless tobacco: Never Used   Tobacco comment: Never Used Tobacco  Substance Use Topics   Alcohol use: No    Alcohol/week: 0.0 standard drinks   Drug use: No     Allergies   Niacin and related, Lactose intolerance (gi), Amoxicillin, Lactase, and Penicillins   Review of Systems Review of Systems  All systems reviewed and negative, other than as noted in HPI.  Physical Exam Updated Vital Signs There were no vitals taken for this visit.  Physical Exam Vitals signs and nursing note reviewed.  Constitutional:      General: She is not in acute distress.    Appearance: She is well-developed.  HENT:     Head: Normocephalic.     Comments: Tenderness and ecchymosis across the nasal bridge.  No significant epistaxis/septal hematoma.  No significant facial/scalp tenderness elsewhere.  No midline spinal tenderness. Eyes:     General:        Right eye: No discharge.        Left eye: No discharge.     Extraocular Movements: Extraocular movements intact.     Conjunctiva/sclera: Conjunctivae normal.     Pupils: Pupils are equal, round, and reactive to light.  Neck:     Musculoskeletal: Neck supple.  Cardiovascular:     Rate and Rhythm: Normal rate and regular  rhythm.     Heart sounds: Normal heart sounds. No murmur. No friction rub. No gallop.   Pulmonary:     Effort: Pulmonary effort is normal. No respiratory distress.     Breath sounds: Normal breath sounds.  Abdominal:     General: There is no distension.     Palpations: Abdomen is soft.     Tenderness: There is no abdominal tenderness.  Musculoskeletal:        General: No tenderness.  Skin:    General: Skin is warm and dry.  Neurological:     General: No focal deficit present.     Mental Status: She is alert. Mental status is at baseline. She is disoriented.     Cranial Nerves: No cranial nerve deficit.     Sensory: No sensory deficit.     Coordination: Coordination normal.  Psychiatric:        Behavior: Behavior normal.        Thought Content: Thought content normal.      ED Treatments / Results  Labs (all labs ordered are listed, but only abnormal results are displayed) Labs Reviewed - No data to display  EKG None  Radiology No results found.   Ct Head Wo Contrast  Result Date: 02/09/2019 CLINICAL DATA:  Fall EXAM: CT HEAD WITHOUT CONTRAST CT MAXILLOFACIAL WITHOUT CONTRAST CT CERVICAL SPINE WITHOUT CONTRAST TECHNIQUE: Multidetector CT imaging of the head, cervical spine, and maxillofacial structures were performed using the standard protocol without intravenous contrast. Multiplanar CT image reconstructions of the cervical spine and maxillofacial structures were also generated. COMPARISON:  None. FINDINGS: CT HEAD FINDINGS Brain: No evidence of acute infarction, hemorrhage, hydrocephalus, extra-axial collection or mass lesion/mass effect. Periventricular white matter hypodensity. Vascular: No hyperdense vessel or unexpected calcification. CT FACIAL BONES FINDINGS Skull: Hyperostosis frontalis. Negative for fracture or focal lesion. Facial bones: No displaced fractures or dislocations. Sinuses/Orbits: No acute finding. Other: None. CT CERVICAL SPINE FINDINGS Alignment:  Degenerative straightening of the normal cervical lordosis with degenerative listhesis of C7 on T1. Skull base and vertebrae: No acute fracture.  No primary bone lesion or focal pathologic process. Soft tissues and spinal canal: No prevertebral fluid or swelling. No visible canal hematoma. Disc levels: Severe multilevel disc degenerative disease and osteophytosis of the cervical spine with ankylosis of the C4-C5. Upper chest: Negative. Other: None. IMPRESSION: 1. No acute intracranial pathology. Small-vessel white matter disease. 2.  No displaced fracture or dislocation of the facial bones. 3. No fracture or static subluxation of the cervical spine. Severe multilevel cervical disc degenerative disease. Electronically Signed   By: Eddie Candle M.D.   On: 02/09/2019 19:08   Ct Cervical Spine Wo Contrast  Result Date: 02/09/2019 CLINICAL DATA:  Fall EXAM: CT HEAD WITHOUT CONTRAST CT MAXILLOFACIAL WITHOUT CONTRAST CT CERVICAL SPINE WITHOUT CONTRAST TECHNIQUE: Multidetector CT imaging of the head, cervical spine, and maxillofacial structures were performed using the standard protocol without intravenous contrast. Multiplanar CT image reconstructions of the cervical spine and maxillofacial structures were also generated. COMPARISON:  None. FINDINGS: CT HEAD FINDINGS Brain: No evidence of acute infarction, hemorrhage, hydrocephalus, extra-axial collection or mass lesion/mass effect. Periventricular white matter hypodensity. Vascular: No hyperdense vessel or unexpected calcification. CT FACIAL BONES FINDINGS Skull: Hyperostosis frontalis. Negative for fracture or focal lesion. Facial bones: No displaced fractures or dislocations. Sinuses/Orbits: No acute finding. Other: None. CT CERVICAL SPINE FINDINGS Alignment: Degenerative straightening of the normal cervical lordosis with degenerative listhesis of C7 on T1. Skull base and vertebrae: No acute fracture. No primary bone lesion or focal pathologic process. Soft tissues  and spinal canal: No prevertebral fluid or swelling. No visible canal hematoma. Disc levels: Severe multilevel disc degenerative disease and osteophytosis of the cervical spine with ankylosis of the C4-C5. Upper chest: Negative. Other: None. IMPRESSION: 1. No acute intracranial pathology. Small-vessel white matter disease. 2.  No displaced fracture or dislocation of the facial bones. 3. No fracture or static subluxation of the cervical spine. Severe multilevel cervical disc degenerative disease. Electronically Signed   By: Eddie Candle M.D.   On: 02/09/2019 19:08   Ct Maxillofacial Wo Contrast  Result Date: 02/09/2019 CLINICAL DATA:  Fall EXAM: CT HEAD WITHOUT CONTRAST CT MAXILLOFACIAL WITHOUT CONTRAST CT CERVICAL SPINE WITHOUT CONTRAST TECHNIQUE: Multidetector CT imaging of the head, cervical spine, and maxillofacial structures were performed using the standard protocol without intravenous contrast. Multiplanar CT image reconstructions of the cervical spine and maxillofacial structures were also generated. COMPARISON:  None. FINDINGS: CT HEAD FINDINGS Brain: No evidence of acute infarction, hemorrhage, hydrocephalus, extra-axial collection or mass lesion/mass effect. Periventricular white matter hypodensity. Vascular: No hyperdense vessel or unexpected calcification. CT FACIAL BONES FINDINGS Skull: Hyperostosis frontalis. Negative for fracture or focal lesion. Facial bones: No displaced fractures or dislocations. Sinuses/Orbits: No acute finding. Other: None. CT CERVICAL SPINE FINDINGS Alignment: Degenerative straightening of the normal cervical lordosis with degenerative listhesis of C7 on T1. Skull base and vertebrae: No acute fracture. No primary bone lesion or focal pathologic process. Soft tissues and spinal canal: No prevertebral fluid or swelling. No visible canal hematoma. Disc levels: Severe multilevel disc degenerative disease and osteophytosis of the cervical spine with ankylosis of the C4-C5. Upper  chest: Negative. Other: None. IMPRESSION: 1. No acute intracranial pathology. Small-vessel white matter disease. 2.  No displaced fracture or dislocation of the facial bones. 3. No fracture or static subluxation of the cervical spine. Severe multilevel cervical disc degenerative disease. Electronically Signed   By: Eddie Candle M.D.   On: 02/09/2019 19:08    Procedures Procedures (including critical care time)  Medications Ordered in ED Medications  morphine 4 MG/ML injection 4 mg (has no administration in time range)     Initial Impression / Assessment and Plan / ED Course  I have reviewed the triage vital signs and the nursing notes.  Pertinent labs & imaging results that were available during my care of the patient were reviewed by me and considered in my medical decision making (see chart for details).        78 year old female with neck pain and facial trauma after a fall.  No LOC.  Nonfocal neurological examination.  Likely nasal fracture.  She has no midline spinal tenderness but she complains of neck pain.  CT head, facial bones and cervical spine.  Imaging okay.  Does not appear to be any significant emergent traumatic findings.  She has no acute neurological complaints.  Plan continue symptomatic treatment as needed for her pain.  Return precautions discussed.  Outpatient follow-up as needed otherwise.  Final Clinical Impressions(s) / ED Diagnoses   Final diagnoses:  Contusion of face, initial encounter  Fall, initial encounter    ED Discharge Orders    None       Virgel Manifold, MD 02/12/19 1054

## 2019-02-09 NOTE — ED Notes (Signed)
Patient verbalizes understanding of discharge instructions. Opportunity for questioning and answers were provided. Armband removed by staff, pt discharged from ED ambulatory.   

## 2019-02-09 NOTE — ED Triage Notes (Signed)
Pt bib [facility] after fall trying to get out of bed. Pt denies loc, not on blood thinners, AOx4. Pt c/o headache and neck pain. VSS. CBG 335.

## 2019-02-13 DIAGNOSIS — K51811 Other ulcerative colitis with rectal bleeding: Secondary | ICD-10-CM | POA: Diagnosis not present

## 2019-02-14 ENCOUNTER — Telehealth: Payer: Self-pay

## 2019-02-14 NOTE — Telephone Encounter (Signed)
Ok to give verbal order to extend PT as requested

## 2019-02-14 NOTE — Telephone Encounter (Signed)
Can you advise on verbal orders in Dr. Bebe Shaggy absence?   Copied from [place] [id number]. Topic: Quick Communication - Home Health Verbal Orders >> Feb 13, 2019 12:07 PM Virl Axe D wrote: Caller/Agency: Laurey Arrow, PT, [facility] Callback Number: [phone] Requesting OT/PT/Skilled Nursing/Social Work/Speech Therapy: Extending PT Frequency: 1 week 2  Pt had a fall on Friday and went to the ED.

## 2019-02-14 NOTE — Telephone Encounter (Signed)
Verbal left on [name]'s voicemail.

## 2019-02-15 ENCOUNTER — Ambulatory Visit: Payer: Self-pay | Admitting: *Deleted

## 2019-02-15 NOTE — Telephone Encounter (Addendum)
  [name] with [facility] there doing routine visit.   Pt's glucose is 356 however the pt is refusing her insulin.    "I don't want to be stuck anymore".    She did take her insulin this morning.    [name] can be reached at [phone]  I sent these notes to the office and called them too.  [name] is requesting their visits be reduced to once a week instead of twice a week.   Thank you.  Reason for Disposition . [1] Blood glucose > 300 mg/dL (16.7 mmol/L) AND [2] two or more times in a row  Answer Assessment - Initial Assessment Questions 1. BLOOD GLUCOSE: "What is your blood glucose level?"      [name] with [facility].      I'm here for routine visit.   356 glucose.   She is refusing her insulin.    She doesn't want to get stuck all time. She did take insulin this morning. We see her twice a week.    Can we see her once a week.   She's not very compliant     Pt adamant not taking her insulin. 2. ONSET: "When did you check the blood glucose?"     356 glucose done at 10 min ago 3. USUAL RANGE: "What is your glucose level usually?" (e.g., usual fasting morning value, usual evening value)     It runs between  200-300 normally. 4. KETONES: "Do you check for ketones (urine or blood test strips)?" If yes, ask: "What does the test show now?"      N/A 5. TYPE 1 or 2:  "Do you know what type of diabetes you have?"  (e.g., Type 1, Type 2, Gestational; doesn't know)      Type 2 6. INSULIN: "Do you take insulin?" "What type of insulin(s) do you use? What is the mode of delivery? (syringe, pen; injection or pump)?"      Yes but she is refusing to be stuck. 7. DIABETES PILLS: "Do you take any pills for your diabetes?" If yes, ask: "Have you missed taking any pills recently?"     Not asked 8. OTHER SYMPTOMS: "Do you have any symptoms?" (e.g., fever, frequent urination, difficulty breathing, dizziness, weakness, vomiting)     No 9. PREGNANCY: "Is there any chance you are pregnant?" "When was your  last menstrual period?"     N/A due to age  Protocols used: DIABETES - HIGH BLOOD SUGAR-A-AH

## 2019-02-15 NOTE — Telephone Encounter (Signed)
Home health is requesting for visits to be once a week instead of twice a week.

## 2019-02-15 NOTE — Telephone Encounter (Signed)
Spoke with nurse [name] who was calling to make Dr. Ethelene Hal aware that pts glucose is 356 but is refusing her insulin. Pts caregiver is also present and is unable to convince pt to take her insulin. I advised [name] to make pt aware she spoke to a physician at Dr. Bebe Shaggy office who strongly recommends she take her insulin. Not doing so can result in symptomatic hyperglycemia, coma, and possibly death. Leslie Benton to document encounter. She cannot force patient to do or take anything the patient does not what to do.  Leslie Benton requests visits be decreased from 2x/wk to 1x/wk. As I am not familiar with the patient, I will let PCP review and decide when he returns to office on Monday. [name] is fine with that.

## 2019-02-18 DIAGNOSIS — I471 Supraventricular tachycardia: Secondary | ICD-10-CM | POA: Diagnosis not present

## 2019-02-18 DIAGNOSIS — E119 Type 2 diabetes mellitus without complications: Secondary | ICD-10-CM | POA: Diagnosis not present

## 2019-02-18 DIAGNOSIS — R197 Diarrhea, unspecified: Secondary | ICD-10-CM | POA: Diagnosis not present

## 2019-02-18 DIAGNOSIS — M1710 Unilateral primary osteoarthritis, unspecified knee: Secondary | ICD-10-CM | POA: Diagnosis not present

## 2019-02-18 DIAGNOSIS — M545 Low back pain: Secondary | ICD-10-CM | POA: Diagnosis not present

## 2019-02-18 DIAGNOSIS — Z794 Long term (current) use of insulin: Secondary | ICD-10-CM | POA: Diagnosis not present

## 2019-02-18 DIAGNOSIS — F419 Anxiety disorder, unspecified: Secondary | ICD-10-CM | POA: Diagnosis not present

## 2019-02-18 DIAGNOSIS — I700 Atherosclerosis of aorta: Secondary | ICD-10-CM | POA: Diagnosis not present

## 2019-02-18 DIAGNOSIS — F329 Major depressive disorder, single episode, unspecified: Secondary | ICD-10-CM | POA: Diagnosis not present

## 2019-02-18 DIAGNOSIS — Z7984 Long term (current) use of oral hypoglycemic drugs: Secondary | ICD-10-CM | POA: Diagnosis not present

## 2019-02-18 DIAGNOSIS — M47812 Spondylosis without myelopathy or radiculopathy, cervical region: Secondary | ICD-10-CM | POA: Diagnosis not present

## 2019-02-18 DIAGNOSIS — I10 Essential (primary) hypertension: Secondary | ICD-10-CM | POA: Diagnosis not present

## 2019-02-19 NOTE — Telephone Encounter (Signed)
Ought to keep it a twice weekly for the next 6 weeks please.

## 2019-02-19 NOTE — Telephone Encounter (Signed)
I called and spoke with Leslie Benton. She is aware to keep visits at twice weekly & that we are seeing pt tomorrow.

## 2019-02-20 ENCOUNTER — Ambulatory Visit: Payer: Medicare Other | Admitting: Family Medicine

## 2019-02-21 ENCOUNTER — Telehealth: Payer: Self-pay | Admitting: Family Medicine

## 2019-02-21 DIAGNOSIS — I700 Atherosclerosis of aorta: Secondary | ICD-10-CM | POA: Diagnosis not present

## 2019-02-21 DIAGNOSIS — E119 Type 2 diabetes mellitus without complications: Secondary | ICD-10-CM | POA: Diagnosis not present

## 2019-02-21 DIAGNOSIS — I10 Essential (primary) hypertension: Secondary | ICD-10-CM | POA: Diagnosis not present

## 2019-02-21 DIAGNOSIS — I471 Supraventricular tachycardia: Secondary | ICD-10-CM | POA: Diagnosis not present

## 2019-02-21 DIAGNOSIS — F329 Major depressive disorder, single episode, unspecified: Secondary | ICD-10-CM | POA: Diagnosis not present

## 2019-02-21 DIAGNOSIS — R197 Diarrhea, unspecified: Secondary | ICD-10-CM | POA: Diagnosis not present

## 2019-02-21 NOTE — Telephone Encounter (Signed)
FYI. Pt fell on 11.2.20 she was coming into the kitchen and stepped up and fell. Pt didn't feel a need to go to ER and stated no injury. Pt is having more fall incidents.

## 2019-02-26 ENCOUNTER — Telehealth: Payer: Self-pay

## 2019-02-26 NOTE — Telephone Encounter (Signed)
See message below °

## 2019-02-26 NOTE — Telephone Encounter (Signed)
I do not think that is an appropriate medicine for her.

## 2019-02-26 NOTE — Telephone Encounter (Signed)
Patient is calling back to check on the status of the medication was it called in.  Patient is requesting Phentermine. Patient states that Phentermine gives her energy. And patient states she has felt bad. This is the only thing that she has not been taking for a while.Phentermine is not on her medication list.  Patient is not requesting Thenteine.  Preferred Pharmacy-[facility] [phone]

## 2019-02-26 NOTE — Telephone Encounter (Signed)
Okay to send in? Looks like it's a vitamin.         Copied from [place] [phone]. Topic: General - Inquiry >> Feb 26, 2019 11:18 AM Leslie Benton, NT wrote: Reason for CRM: Patient called in stating she would like to see if she can have a prescription for Thenteine? Pt stated she wants this today. Do not see it on medication list and she keeps saying she wants to have this done. Please advise.

## 2019-02-27 NOTE — Telephone Encounter (Signed)
Spoke with patient about medication not being approved. She is not understanding why she can't have it. It helps her with her energy and stress she's been down and feeling bad without it its causing her not to sleep well. Pt does not understand what her diabetes has to do with it pt said she's been having diabetes for years along with taking phentermine. Pt would still like a follow up about this manner.

## 2019-02-27 NOTE — Telephone Encounter (Signed)
This is not an appropriate medicine for her any longer. No I will not refill it.

## 2019-02-27 NOTE — Telephone Encounter (Signed)
Pt called in to follow up on medication request. Pt says that the medication helps her. Pt says that she has been taking this medication for years.     CB: [phone]

## 2019-02-27 NOTE — Telephone Encounter (Signed)
Pt called and is requesting to have a call back to discuss this. Please advise.

## 2019-03-05 ENCOUNTER — Other Ambulatory Visit: Payer: Self-pay

## 2019-03-05 DIAGNOSIS — E1165 Type 2 diabetes mellitus with hyperglycemia: Secondary | ICD-10-CM

## 2019-03-05 MED ORDER — INSULIN DETEMIR 100 UNIT/ML FLEXPEN: 20.0000 [IU] | PEN_INJECTOR | Freq: Every day | SUBCUTANEOUS | 0 refills | Status: DC

## 2019-03-05 MED ORDER — METFORMIN HCL 1000 MG PO TABS: 1000.0000 mg | ORAL_TABLET | Freq: Two times a day (BID) | ORAL | 3 refills | Status: AC

## 2019-03-12 ENCOUNTER — Telehealth: Payer: Self-pay

## 2019-03-12 NOTE — Telephone Encounter (Signed)
Pt declined f/u here

## 2019-03-12 NOTE — Telephone Encounter (Signed)
Per Dr. Loanne Drilling, unable to refill Lantus d/t pt choice to no longer be managed by Dr. Loanne Drilling.

## 2019-03-12 NOTE — Telephone Encounter (Signed)
LOV 06/20/18. Per Dr. Cordelia Pen office note, pt was advised to f/u prn. No future appt noted. Uncertain if Dr. Loanne Drilling will continue to manage refills. Routing this message to Dr. Loanne Drilling for him to review and advise.

## 2019-03-13 ENCOUNTER — Encounter: Payer: Medicare Other | Attending: Family Medicine | Admitting: Registered"

## 2019-03-13 ENCOUNTER — Telehealth: Payer: Self-pay

## 2019-03-13 DIAGNOSIS — E1165 Type 2 diabetes mellitus with hyperglycemia: Secondary | ICD-10-CM | POA: Diagnosis not present

## 2019-03-13 NOTE — Progress Notes (Signed)
Medical Nutrition Therapy:  Appt start time: 1500 end time:  1520.  Patient referred for Diabetes Education Per Epic A1c 11.6% 10/11/2018  Assessment:  Primary concerns today: Patient states she wants to know about protein also states she is tired.  At the 2 pm appointment time RD called patient. Patient stated she had someone there working on her home and did not have an hour to talk to me. Patient stated that her knee was hurting and wanted to take medicine and take a nap. RD asked if it was okay to call her back at 3 pm to see if we could figure out a plan to connect and she agreed.  3:00 - 3:20 pm: When RD called back at first patient stated said she couldn't talk.RD assured her that she would not be trying to force medications on her patient decided she wanted to talk.  RD believes from reading previous encounters and problem list that she is likely chronically dehydrated which is contributing to her tiredness and also putting her at increased risk for DKA/HHS.   RD started assessment to evaluate her fluid intake and got as far as breakfast. Patient stated she had to go because she needed to talk to the person who was helping her fix up her house for family coming in tomorrow. Sounded like she wanted another call, but RD needs a family member or other caregiver present either in person or on the phone to be productive. Pt states she will see if she can find her son and call us back for a follow-up call.   Details about breakfast conversation: Patient asked what a good breakfast is and eggs was suggested and she states loves eggs but she cannot stand long enough to cook and clean up. Pt states her son lives with them because her husband, who had a stroke 3 yrs ago, needs care in addition to his daily home health nurse visits. RD inquired what her morning routine is in the morning and she states that she tries to stay out of her son's way. Pt states her son buys the groceries and cooks a lot of the  meals. Pt states he cooks cereal for her. She has coffee and water mixed with juice (~50/50). The questions seem to wear her out. RD tried to encourage more water intake with less juice but patient was not receptive.  RD discussed with patient that according to notes from Dr. Ethelene Hal, her daughter was planning to be with her for diabetes education. Patient states she was not aware of that and states daughter lives in [place] and too busy. Another visit with her son included in the education would probably be more productive.  Preferred Learning Style:  No preference indicated   Learning Readiness:   Not ready (precontemplation)   MEDICATIONS: reviewed   DIETARY INTAKE:  24-hr recall: (only breakfast was assessed) B ( AM): oatmeal, coffee with creamer, water & juice  Snk ( AM):   L ( PM):  Snk ( PM):  D ( PM):  Snk ( PM):  Beverages:   Usual physical activity: not assesed  Estimated energy needs: 1600-1800 calories  Progress Towards Goal(s):  No goals set.   Nutritional Diagnosis:  NI-5.8.3 Inappropriate intake of types of carbohydrates (specify): juice As related to history of BG >300 and unwilling to take insulin.  As evidenced by dietary recall and chart labs/notes.    Intervention:  Nutrition education. Discussed importance of hydration.  Teaching Method Utilized: (televisit) Auditory  Handouts given during visit include:  none  Barriers to learning/adherence to lifestyle change: unwillingness to take medication or make dietary changes. Physical limitations to prepare food  Demonstrated degree of understanding via:  Teach Back   Monitoring/Evaluation:  Dietary intake, exercise, and body weight prn.

## 2019-03-13 NOTE — Telephone Encounter (Signed)
MEDICATION:   PHARMACY:    IS THIS A 90 DAY SUPPLY :   IS PATIENT OUT OF MEDICATION:   IF NOT; HOW MUCH IS LEFT:   LAST APPOINTMENT DATE: @11 /23/2020  NEXT APPOINTMENT DATE:@Visit  date not found  DO WE HAVE YOUR PERMISSION TO LEAVE A DETAILED MESSAGE:  OTHER COMMENTS:    **Let patient know to contact pharmacy at the end of the day to make sure medication is ready. **  ** Please notify patient to allow 48-72 hours to process**  **Encourage patient to contact the pharmacy for refills or they can request refills through [facility]**     Made in error

## 2019-03-16 DIAGNOSIS — F329 Major depressive disorder, single episode, unspecified: Secondary | ICD-10-CM | POA: Diagnosis not present

## 2019-03-16 DIAGNOSIS — E119 Type 2 diabetes mellitus without complications: Secondary | ICD-10-CM | POA: Diagnosis not present

## 2019-03-16 DIAGNOSIS — I700 Atherosclerosis of aorta: Secondary | ICD-10-CM | POA: Diagnosis not present

## 2019-03-16 DIAGNOSIS — R197 Diarrhea, unspecified: Secondary | ICD-10-CM | POA: Diagnosis not present

## 2019-03-16 DIAGNOSIS — I10 Essential (primary) hypertension: Secondary | ICD-10-CM | POA: Diagnosis not present

## 2019-03-16 DIAGNOSIS — I471 Supraventricular tachycardia: Secondary | ICD-10-CM | POA: Diagnosis not present

## 2019-03-26 ENCOUNTER — Ambulatory Visit: Payer: Self-pay

## 2019-03-26 NOTE — Telephone Encounter (Signed)
Attempted to reach pt, she was not home. Will try to call her later

## 2019-03-26 NOTE — Telephone Encounter (Signed)
Recommend face to face patient visit.

## 2019-03-26 NOTE — Telephone Encounter (Signed)
Spoke with patient - she declines the visit as recommended.  Pt states that she does not have a ride and does not feel the visit is necessary has her blood pressure is "fine". I advised that her [facility] RN was concerned with her elevated readings and the patient states that her BP is fine every times its checked and she has never been made aware of any elevated readings - "this is news to me!" Pt states that she is unaware of these "high blood pressure readings" - she said thank you for your concern but at this time she is not going to schedule an appt.   I advised her that if she herself notices any elevated BP readings that she needs to contact Dr Ethelene Hal ASAP - elevated BP readings 150s or higher and any other sx's.   Will send to Dr Ethelene Hal as Juluis Rainier

## 2019-03-26 NOTE — Telephone Encounter (Signed)
Call was received by [facility] RN to report patient BP elevated to 162/98. Caller states she is not with the patient and requested NT to contact patient directly.  Patient is denying hypertension.  Per PCA, Antoinette, patients BP have all been normal.  A log is kept every day.  Per the log patient highest BP reading Nov. 14 169/79. today's BP 142/89.  PCA is not aware of patient having a HH Therapist, sports. Will route information to Dr Ethelene Hal.

## 2019-04-25 ENCOUNTER — Telehealth: Payer: Self-pay | Admitting: Family Medicine

## 2019-04-25 NOTE — Telephone Encounter (Signed)
Patient would like to discuss BP medication, patient stated that she wants to take the one with the least side effects. Please call patient at [phone].

## 2019-04-30 ENCOUNTER — Encounter: Payer: Self-pay | Admitting: Family Medicine

## 2019-04-30 ENCOUNTER — Ambulatory Visit: Payer: Self-pay

## 2019-04-30 ENCOUNTER — Ambulatory Visit (INDEPENDENT_AMBULATORY_CARE_PROVIDER_SITE_OTHER): Payer: Medicare Other | Admitting: Family Medicine

## 2019-04-30 VITALS — Ht 63.0 in

## 2019-04-30 DIAGNOSIS — L853 Xerosis cutis: Secondary | ICD-10-CM

## 2019-04-30 MED ORDER — HYDROXYZINE HCL 10 MG PO TABS: 5.0000 mg | ORAL_TABLET | Freq: Three times a day (TID) | ORAL | 0 refills | Status: DC | PRN

## 2019-04-30 MED ORDER — HYDROXYZINE HCL 10 MG PO TABS: ORAL_TABLET | ORAL | 0 refills | Status: DC

## 2019-04-30 NOTE — Telephone Encounter (Signed)
Patient daughter called and wants Triage to call her mother for high BP. Call placed to patient.  She states she has been itching and not taking her BP medication as prescribed. She gives BP hx of  170/70 on 1/5 and another 12/17 of 138/70. She states that only thing bothering her is the itching and she is trying to figure out which medication may be causing her itching. Care giver in home has just checked BP and is now 213/103. She denies weakness headache chest pain.  She states she took her propanolol half dose for her heart rate but is unable to check that for me. Care advice was read to patient.  She verbalized understanding of all information. Call transferred to office for scheduling.   Reason for Disposition . [id number] Systolic BP  >= A999333 OR Diastolic >= 123456  AND A999333 having NO cardiac or neurologic symptoms  Answer Assessment - Initial Assessment Questions 1. BLOOD PRESSURE: "What is the blood pressure?" "Did you take at least two measurements 5 minutes apart?"     BP has been high 2. ONSET: "When did you take your blood pressure?"    yesterday 3. HOW: "How did you obtain the blood pressure?" (e.g., visiting nurse, automatic home BP monitor)    Care giver took it yesterday. 4. HISTORY: "Do you have a history of high blood pressure?"   yes 5. MEDICATIONS: "Are you taking any medications for blood pressure?" "Have you missed any doses recently?"    Yes taking medications different 6. OTHER SYMPTOMS: "Do you have any symptoms?" (e.g., headache, chest pain, blurred vision, difficulty breathing, weakness)     Itching  7. PREGNANCY: "Is there any chance you are pregnant?" "When was your last menstrual period?"    N/A  Protocols used: HIGH BLOOD PRESSURE-A-AH

## 2019-04-30 NOTE — Progress Notes (Signed)
Established Patient Office Visit  Subjective:  Patient ID: Leslie Benton, female    DOB: [birth year]  Age: 79 y.o. MRN: [id number]  CC:  Chief Complaint  Patient presents with  . Medication Refill    pt states that her current blood pressure medication seems to cause major itching all over body since she started taking it. she would like to switch med to something different.     HPI ELMIRA Benton presents for intermittent generalized pruritus.  She wonders if it may be related to her blood pressure medicines.  She admits to having very dry skin.  She does use Dove soap.  She is applying Eucerin cream but not necessarily immediately after she towel dries.  There is no rash.  Sometimes the itchy spot is on her back which she cannot reach with the use for him.  There is not always someone there to help her apply the moisturizer.  She has taken the blood pressure medicines for some time now.  She mentions Lotensin blood pressure medicine and lieu of lisinopril but I think she is confused between the 2.  She has never been on Lotensin.  This clinic.  It is not on her medication list.  Past Medical History:  Diagnosis Date  . Anemia   . Anxiety   . Cervical spondylosis without myelopathy 05/29/2015  . COLONIC POLYPS, HX OF 02/07/2008  . DEGENERATIVE JOINT DISEASE 10/14/2006   03/31/11: Pt states arthritis in her hip.  . Depression   . DIABETES MELLITUS, TYPE II 10/27/2009  . GERD 10/14/2006  . HYPERLIPIDEMIA 10/14/2006  . HYPERTENSION 10/14/2006   off bp meds now  . HYPOTHYROIDISM 10/14/2006  . LOW BACK PAIN 08/04/2007  . Obesity     Past Surgical History:  Procedure Laterality Date  . BIOPSY  01/12/2019   Procedure: BIOPSY;  Surgeon: Otis Brace, MD;  Location: [facility];  Service: Gastroenterology;;  . Otho Darner SIGMOIDOSCOPY N/A 01/12/2019   Procedure: FLEXIBLE SIGMOIDOSCOPY;  Surgeon: Otis Brace, MD;  Location: [facility];  Service: Gastroenterology;  Laterality: N/A;  .  HEMORRHOID SURGERY    . Left knee arthroscopic  April 2014   Dr. Durward Fortes  . PARTIAL KNEE ARTHROPLASTY Left 09/23/2014   Procedure: LEFT UNICOMPARTMENTAL KNEE;  Surgeon: [name], MD;  Location: [facility];  Service: Orthopedics;  Laterality: Left;  . TONSILLECTOMY    . TUBAL LIGATION      Family History  Problem Relation Age of Onset  . Heart attack Father 66  . Diabetes Father   . Kidney failure Mother   . Cancer Sister        endometrial  . Cancer Brother        Adrenal    Social History   Socioeconomic History  . Marital status: Married    Spouse name: Not on file  . Number of children: 2  . Years of education: college  . Highest education level: Not on file  Occupational History  . Occupation: retired  Tobacco Use  . Smoking status: Never Smoker  . Smokeless tobacco: Never Used  . Tobacco comment: Never Used Tobacco  Substance and Sexual Activity  . Alcohol use: No    Alcohol/week: 0.0 standard drinks  . Drug use: No  . Sexual activity: Not on file  Other Topics Concern  . Not on file  Social History Narrative   Lives with husband and son.     Patient drinks 1-2 cups of caffeine daily.   Patient is  left handed.    Social Determinants of Health   Financial Resource Strain:   . Difficulty of Paying Living Expenses: Not on file  Food Insecurity:   . Worried About Charity fundraiser in the Last Year: Not on file  . Ran Out of Food in the Last Year: Not on file  Transportation Needs:   . Lack of Transportation (Medical): Not on file  . Lack of Transportation (Non-Medical): Not on file  Physical Activity:   . Days of Exercise per Week: Not on file  . Minutes of Exercise per Session: Not on file  Stress:   . Feeling of Stress : Not on file  Social Connections:   . Frequency of Communication with Friends and Family: Not on file  . Frequency of Social Gatherings with Friends and Family: Not on file  . Attends Religious Services: Not on file  . Active  Member of Clubs or Organizations: Not on file  . Attends Archivist Meetings: Not on file  . Marital Status: Not on file  Intimate Partner Violence:   . Fear of Current or Ex-Partner: Not on file  . Emotionally Abused: Not on file  . Physically Abused: Not on file  . Sexually Abused: Not on file    Outpatient Medications Prior to Visit  Medication Sig Dispense Refill  . ibuprofen (ADVIL,MOTRIN) 200 MG tablet Take 400 mg by mouth as needed for headache or moderate pain.     Marland Kitchen levothyroxine (SYNTHROID) 175 MCG tablet Take 1 tablet (175 mcg total) by mouth daily before breakfast. 90 tablet 1  . Blood Glucose Monitoring Suppl (ONE TOUCH ULTRA 2) w/Device KIT Use to test blood sugar 1-2 times daily. (Patient not taking: Reported on 04/30/2019) 1 kit 0  . citalopram (CELEXA) 10 MG tablet Take one daily for one week and then increase to 2 daily. 60 tablet 1  . Continuous Blood Gluc Receiver (FREESTYLE LIBRE 14 DAY READER) DEVI 1 each by Does not apply route daily. Use as directed on box. (Patient not taking: Reported on 04/30/2019) 1 each 3  . Continuous Blood Gluc Sensor (FREESTYLE LIBRE 14 DAY SENSOR) MISC 1 each by Does not apply route daily. Use as directed. (Patient not taking: Reported on 04/30/2019) 1 each 3  . fluticasone (FLONASE) 50 MCG/ACT nasal spray SHAKE LIQUID AND USE 2 SPRAYS IN EACH NOSTRIL DAILY (Patient not taking: No sig reported) 16 g 2  . glucose blood (ONETOUCH ULTRA) test strip Use to test blood sugars 1-2 times daily. (Patient not taking: Reported on 04/30/2019) 100 each 12  . insulin aspart (NOVOLOG) 100 UNIT/ML FlexPen Inject 8 Units into the skin 3 (three) times daily with meals. (Patient not taking: Reported on 04/30/2019) 15 mL 0  . insulin aspart (NOVOLOG) 100 UNIT/ML FlexPen Sliding scale: CBG < 70: Implement Hypoglycemia Standing Orders and refer to Hypoglycemia Standing Orders sidebar report  CBG 70 - 120: 0 units  CBG 121 - 150: 2 units  CBG 151 - 200: 3  units  CBG 201 - 250: 5 units  CBG 251 - 300: 8 units  CBG 301 - 350: 11 units  CBG 351 - 400: 15 units  CBG > 400 Notify facility MD (Patient not taking: Reported on 04/30/2019) 15 mL 11  . Insulin Detemir (LEVEMIR) 100 UNIT/ML Pen Inject 20 Units into the skin daily. (Patient not taking: Reported on 04/30/2019) 15 mL 0  . Lancets (ONETOUCH ULTRASOFT) lancets Use to test blood sugars 1-2 times daily. (  Patient not taking: Reported on 04/30/2019) 100 each 12  . lisinopril (ZESTRIL) 10 MG tablet Take 1 tablet (10 mg total) by mouth daily. (Patient not taking: Reported on 04/30/2019) 30 tablet 2  . loratadine (CLARITIN) 10 MG tablet Take 10 mg by mouth daily as needed for allergies or rhinitis.     Marland Kitchen mesalamine (CANASA) 1000 MG suppository Place 1 suppository (1,000 mg total) rectally 2 (two) times daily. 60 suppository 0  . mesalamine (PENTASA) 250 MG CR capsule Take 4 capsules (1,000 mg total) by mouth 4 (four) times daily. 480 capsule 0  . metFORMIN (GLUCOPHAGE) 1000 MG tablet Take 1 tablet (1,000 mg total) by mouth 2 (two) times daily with a meal. (Patient not taking: Reported on 04/30/2019) 180 tablet 3  . metoprolol succinate (TOPROL-XL) 50 MG 24 hr tablet Take 1 tablet (50 mg total) by mouth daily. Take with or immediately following a meal. (Patient not taking: Reported on 04/30/2019) 90 tablet 3  . metoprolol tartrate (LOPRESSOR) 25 MG tablet Take 1 tablet (25 mg total) by mouth 4 (four) times daily. 120 tablet 0  . predniSONE (DELTASONE) 10 MG tablet Taper dose: 40 mg p.o. daily for 1 week, then 30 mg p.o. daily for 1 week, and 20 mg p.o. daily for 1 week, then 10 mg p.o. daily for 1 week then discontinue, zero refills (Patient not taking: Reported on 04/30/2019) 70 tablet 0  . QUEtiapine (SEROQUEL) 25 MG tablet Take one to two at night as needed. (Patient not taking: Reported on 04/30/2019) 60 tablet 2   No facility-administered medications prior to visit.    Allergies  Allergen Reactions  .  Niacin And Related Hives and Swelling    No anaphalaxis, but strong reactions.  . Lactose Intolerance (Gi)     "bad taste in mouth"  . Amoxicillin Swelling    hives  . Lactase   . Penicillins Swelling    Hives With all cillins    ROS Review of Systems  Constitutional: Negative.   Respiratory: Negative.   Cardiovascular: Negative.   Gastrointestinal: Negative.   Skin: Negative for color change, rash and wound.  Neurological: Negative for light-headedness and headaches.      Objective:    Physical Exam  Constitutional: She is oriented to person, place, and time. No distress.  Pulmonary/Chest: Effort normal.  Neurological: She is alert and oriented to person, place, and time.  Psychiatric: She has a normal mood and affect. Her behavior is normal.    Ht 5' 3"  (1.6 m)   BMI 29.23 kg/m  Wt Readings from Last 3 Encounters:  01/18/19 165 lb (74.8 kg)  01/12/19 164 lb 14.5 oz (74.8 kg)  12/19/18 165 lb (74.8 kg)     Health Maintenance Due  Topic Date Due  . DEXA SCAN  04/17/2004  . OPHTHALMOLOGY EXAM  05/03/2015  . HEMOGLOBIN A1C  04/12/2019    There are no preventive care reminders to display for this patient.  Lab Results  Component Value Date   TSH 0.789 01/06/2019   Lab Results  Component Value Date   WBC 8.2 01/14/2019   HGB 11.2 (L) 01/14/2019   HCT 36.9 01/14/2019   MCV 82.9 01/14/2019   PLT 296 01/14/2019   Lab Results  Component Value Date   NA 136 01/14/2019   K 4.8 01/14/2019   CO2 20 (L) 01/14/2019   GLUCOSE 242 (H) 01/14/2019   BUN 16 01/14/2019   CREATININE 0.77 01/14/2019   BILITOT 0.1 (L) 01/14/2019  ALKPHOS 111 01/14/2019   AST 14 (L) 01/14/2019   ALT 14 01/14/2019   PROT 5.4 (L) 01/14/2019   ALBUMIN 2.4 (L) 01/14/2019   CALCIUM 7.9 (L) 01/14/2019   ANIONGAP 7 01/14/2019   GFR 71.16 10/11/2018   Lab Results  Component Value Date   CHOL 282 (H) 08/05/2015   Lab Results  Component Value Date   HDL 54.10 08/05/2015   Lab  Results  Component Value Date   LDLCALC 111 (H) 07/03/2014   Lab Results  Component Value Date   TRIG 307.0 (H) 08/05/2015   Lab Results  Component Value Date   CHOLHDL 5 08/05/2015   Lab Results  Component Value Date   HGBA1C 11.6 (H) 10/11/2018      Assessment & Plan:   Problem List Items Addressed This Visit      Musculoskeletal and Integument   Xerosis cutis - Primary   Relevant Medications   hydrOXYzine (ATARAX/VISTARIL) 10 MG tablet      Meds ordered this encounter  Medications  . DISCONTD: hydrOXYzine (ATARAX/VISTARIL) 10 MG tablet    Sig: Take 0.5 tablets (5 mg total) by mouth 3 (three) times daily as needed.    Dispense:  30 tablet    Refill:  0  . hydrOXYzine (ATARAX/VISTARIL) 10 MG tablet    Sig: Take 1/2 half tab nightly as needed for itching.    Dispense:  30 tablet    Refill:  0    Follow-up: No follow-ups on file.  I approved the Dove soap is a good choice for her.  Encouraged her to apply the Eucerin to her total body the best that she can directly after towel drying.  Small dose of Atarax to be taken at night as needed for itching.  Asked her to have her set up a visit with labs could be there so we can go over all of her medicines.  [name], MD   Virtual Visit via Video Note  I connected with Volanda Napoleon on 04/30/19 at  3:00 PM EST by a video enabled telemedicine application and verified that I am speaking with the correct person using two identifiers.  Location: Patient: home with her husband and care giver.  Provider:    I discussed the limitations of evaluation and management by telemedicine and the availability of in person appointments. The patient expressed understanding and agreed to proceed.  History of Present Illness:    Observations/Objective:   Assessment and Plan:   Follow Up Instructions:    I discussed the assessment and treatment plan with the patient. The patient was provided an opportunity to ask  questions and all were answered. The patient agreed with the plan and demonstrated an understanding of the instructions.   The patient was advised to call back or seek an in-person evaluation if the symptoms worsen or if the condition fails to improve as anticipated.  I provided 20 minutes of non-face-to-face time during this encounter.   [name], MD   Interactive video and audio telecommunications were attempted between myself and the patient. However they failed due to the patient having technical difficulties or not having access to video capability. We continued and completed with audio only.

## 2019-04-30 NOTE — Telephone Encounter (Signed)
Pt has an telephone visit today. Closing this message

## 2019-05-02 ENCOUNTER — Telehealth: Payer: Self-pay | Admitting: Family Medicine

## 2019-05-02 NOTE — Telephone Encounter (Signed)
Spoke with patient who's calling with questions about Celexa 10mg . Per pt she does not recall ever taking this medication and she received a call from her pharmacy today informing her that she needs a refill. Patient not sure why they were calling her. I let patient know that it does show in her records that she have had a Rx for this medication I the pass. Per patient she does not remember taking this medication and she would not like to start taking it and she does not need a refill. Pt states that her live in son also argee with her and she does not need a refill on medication.

## 2019-05-02 NOTE — Telephone Encounter (Signed)
Patient is calling to discuss medications. Patient does not know what her medication is for and wants to talk to someone. Patient also wants to discuss this medication citalopram.

## 2019-05-03 NOTE — Telephone Encounter (Signed)
Tried to call pt in regards to her medication, no answer LMTCB

## 2019-05-03 NOTE — Telephone Encounter (Signed)
Patient had come into the clinic with her daughter who lives out of town and we had decided to try it. It looks as though she never started it.

## 2019-05-04 ENCOUNTER — Telehealth: Payer: Self-pay | Admitting: Family Medicine

## 2019-05-04 NOTE — Telephone Encounter (Signed)
Pt's spouse calling - states that pt has a hard time pricking her finger for blood glucose reading.  Wants to know if she is eligible for the machine that doesn't require a finger stick. Pt uses  [facility] [place], [address] Phone:  [phone]  Fax:  [phone]

## 2019-05-04 NOTE — Telephone Encounter (Signed)
Wrong office

## 2019-05-05 DIAGNOSIS — E039 Hypothyroidism, unspecified: Secondary | ICD-10-CM | POA: Diagnosis not present

## 2019-05-05 DIAGNOSIS — E1165 Type 2 diabetes mellitus with hyperglycemia: Secondary | ICD-10-CM | POA: Diagnosis not present

## 2019-05-05 DIAGNOSIS — I10 Essential (primary) hypertension: Secondary | ICD-10-CM | POA: Diagnosis not present

## 2019-05-11 ENCOUNTER — Telehealth: Payer: Self-pay | Admitting: Family Medicine

## 2019-05-11 NOTE — Telephone Encounter (Signed)
Patient calling and requesting a glucose machine that you don't have to prick your finger with. Please call patient to advise.

## 2019-05-11 NOTE — Telephone Encounter (Signed)
Diabetes meter   PHARMACY: [address] [place], [place] [address]  Comments:   **Let patient know to contact pharmacy at the end of the day to make sure medication is ready. **  ** Please notify patient to allow 48-72 hours to process**  **Encourage patient to contact the pharmacy for refills or they can request refills through [facility]**

## 2019-05-12 ENCOUNTER — Other Ambulatory Visit: Payer: Self-pay | Admitting: Family Medicine

## 2019-05-12 DIAGNOSIS — F419 Anxiety disorder, unspecified: Secondary | ICD-10-CM

## 2019-05-12 DIAGNOSIS — F329 Major depressive disorder, single episode, unspecified: Secondary | ICD-10-CM

## 2019-05-14 ENCOUNTER — Telehealth: Payer: Self-pay | Admitting: Family Medicine

## 2019-05-14 DIAGNOSIS — E039 Hypothyroidism, unspecified: Secondary | ICD-10-CM

## 2019-05-14 NOTE — Telephone Encounter (Signed)
Patient is calling and requesting a refill Levothyroxine sent to [facility] on [address].

## 2019-05-15 DIAGNOSIS — H04123 Dry eye syndrome of bilateral lacrimal glands: Secondary | ICD-10-CM | POA: Diagnosis not present

## 2019-05-15 DIAGNOSIS — H40053 Ocular hypertension, bilateral: Secondary | ICD-10-CM | POA: Diagnosis not present

## 2019-05-15 DIAGNOSIS — H2513 Age-related nuclear cataract, bilateral: Secondary | ICD-10-CM | POA: Diagnosis not present

## 2019-05-15 DIAGNOSIS — E119 Type 2 diabetes mellitus without complications: Secondary | ICD-10-CM | POA: Diagnosis not present

## 2019-05-15 NOTE — Telephone Encounter (Signed)
Patient is calling regarding refill for Levothyroxine sent to [facility] on [address]. CB is [phone].

## 2019-05-17 MED ORDER — LEVOTHYROXINE SODIUM 175 MCG PO TABS: 175.0000 ug | ORAL_TABLET | Freq: Every day | ORAL | 1 refills | Status: DC

## 2019-05-18 ENCOUNTER — Telehealth: Payer: Self-pay | Admitting: Family Medicine

## 2019-05-18 NOTE — Telephone Encounter (Signed)
Leslie Benton is calling and wanted to speak to Dr. Ethelene Hal about a PA. CB is [phone] opt [phone]

## 2019-05-30 NOTE — Telephone Encounter (Signed)
Spoke with the pt, she stated they paid for the freestyle Libre glucose device.   Pt stated she does not want it because it is expensive and do know how to use it or have anyone show her to use it since they have to change the new home nurse hours.   Pt request for [place] to call her son back to follow up about this next week [place] [phone])   Pt also only want to use [facility] on [address].

## 2019-05-30 NOTE — Telephone Encounter (Signed)
PA for machine denied

## 2019-06-12 ENCOUNTER — Telehealth: Payer: Self-pay

## 2019-06-12 NOTE — Telephone Encounter (Signed)
Called patient who states that she will hold on Freestyle glucose devise at this time. I also spoke with her son was asked to give him a call this week but he was not sure what the call was regarding. No concerns at this time.

## 2019-06-16 DIAGNOSIS — E119 Type 2 diabetes mellitus without complications: Secondary | ICD-10-CM | POA: Diagnosis not present

## 2019-06-16 DIAGNOSIS — F419 Anxiety disorder, unspecified: Secondary | ICD-10-CM | POA: Diagnosis not present

## 2019-06-16 DIAGNOSIS — R4689 Other symptoms and signs involving appearance and behavior: Secondary | ICD-10-CM | POA: Diagnosis not present

## 2019-06-16 DIAGNOSIS — I10 Essential (primary) hypertension: Secondary | ICD-10-CM | POA: Diagnosis not present

## 2019-07-03 ENCOUNTER — Telehealth: Payer: Self-pay

## 2019-07-03 NOTE — Telephone Encounter (Signed)
Plz reach out to pt to schedule appt with Dr. Ethelene Hal for DM check/thx dmf

## 2019-07-04 NOTE — Telephone Encounter (Signed)
Spoke to patient who states she has too much going on right now to make an appointment. She also stated that she is having a hard time "dealing with everything" and can only take the metformin once a day. She will call us in the next couple of weeks to schedule an appt.

## 2019-07-13 ENCOUNTER — Telehealth: Payer: Self-pay | Admitting: Family Medicine

## 2019-07-13 NOTE — Telephone Encounter (Signed)
Spoke with Leslie Benton(pt) she declined to schedule at this time and stated to call back in a few weeks.

## 2019-07-26 DIAGNOSIS — E669 Obesity, unspecified: Secondary | ICD-10-CM | POA: Diagnosis not present

## 2019-07-26 DIAGNOSIS — R4689 Other symptoms and signs involving appearance and behavior: Secondary | ICD-10-CM | POA: Diagnosis not present

## 2019-07-26 DIAGNOSIS — E1165 Type 2 diabetes mellitus with hyperglycemia: Secondary | ICD-10-CM | POA: Diagnosis not present

## 2019-07-26 DIAGNOSIS — I10 Essential (primary) hypertension: Secondary | ICD-10-CM | POA: Diagnosis not present

## 2019-08-06 ENCOUNTER — Telehealth: Payer: Self-pay | Admitting: Family Medicine

## 2019-08-06 ENCOUNTER — Other Ambulatory Visit: Payer: Self-pay | Admitting: Family Medicine

## 2019-08-06 NOTE — Telephone Encounter (Signed)
Patient is calling and requesting accu check test strips sent to [facility] on [address]. CB is [phone]

## 2019-08-07 MED ORDER — GLUCOSE BLOOD VI STRP: ORAL_STRIP | 12 refills | Status: DC

## 2019-08-07 NOTE — Telephone Encounter (Signed)
Pt calling and said that the pharmacist told her to call us and have [place] send in new script for accu-check instead of one touch, she would like to try and get it filled today, please advise

## 2019-08-07 NOTE — Addendum Note (Signed)
Addended by: Darral Dash on: 08/07/2019 02:41 PM   Modules accepted: Orders

## 2019-08-07 NOTE — Telephone Encounter (Signed)
Accu-check strips sent in today.

## 2019-08-13 ENCOUNTER — Telehealth: Payer: Self-pay | Admitting: Family Medicine

## 2019-08-13 NOTE — Progress Notes (Signed)
  Chronic Care Management   Outreach Note  08/13/2019 Name: Leslie Benton MRN: [id number] DOB: [birth year]  Referred by: [name], MD Reason for referral : No chief complaint on file.   An unsuccessful telephone outreach was attempted today. The patient was referred to the pharmacist for assistance with care management and care coordination.   This note is not being shared with the patient for the following reason: To respect privacy (The patient or proxy has requested that the information not be shared).  Follow Up Plan:   Earney [place] [facility] Scheduler

## 2019-08-20 ENCOUNTER — Telehealth: Payer: Self-pay | Admitting: Family Medicine

## 2019-08-20 NOTE — Chronic Care Management (AMB) (Signed)
  Chronic Care Management   Outreach Note  08/20/2019 Name: Leslie Benton MRN: [id number] DOB: 1940-09-30  Referred by: [name], MD Reason for referral : Chronic Care Management   An unsuccessful telephone outreach was attempted today. The patient was referred to the pharmacist for assistance with care management and care coordination.   Follow Up Plan:   [place]

## 2019-08-31 ENCOUNTER — Telehealth: Payer: Self-pay | Admitting: Family Medicine

## 2019-08-31 NOTE — Chronic Care Management (AMB) (Signed)
°  Chronic Care Management   Outreach Note  08/31/2019 Name: Leslie Benton MRN: [id number] DOB: [birth year]  Referred by: [name], MD Reason for referral : Chronic Care Management   An unsuccessful telephone outreach was attempted today. The patient was referred to the pharmacist for assistance with care management and care coordination.   Follow Up Plan:   [place]

## 2019-09-12 ENCOUNTER — Telehealth: Payer: Self-pay | Admitting: Family Medicine

## 2019-09-12 NOTE — Chronic Care Management (AMB) (Signed)
  Chronic Care Management   Note  09/12/2019 Name: LABRESHA BOQUIST MRN: [id number] DOB: 1940/07/23  JEANENNE PRENTIS is a 79 y.o. year old female who is a primary care patient of [name], MD. I reached out to Volanda Napoleon by phone today in response to a referral sent by Ms. Domingo Pulse Swicegood's PCP, [name], MD.   Ms. Burkle was given information about Chronic Care Management services today including:  1. CCM service includes personalized support from designated clinical staff supervised by her physician, including individualized plan of care and coordination with other care providers 2. 24/7 contact phone numbers for assistance for urgent and routine care needs. 3. Service will only be billed when office clinical staff spend 20 minutes or more in a month to coordinate care. 4. Only one practitioner may furnish and bill the service in a calendar month. 5. The patient may stop CCM services at any time (effective at the end of the month) by phone call to the office staff.   Patient agreed to services and verbal consent obtained.   Follow up plan:  [place]

## 2019-09-13 ENCOUNTER — Ambulatory Visit: Payer: Medicare Other | Admitting: Internal Medicine

## 2019-09-29 DIAGNOSIS — E1165 Type 2 diabetes mellitus with hyperglycemia: Secondary | ICD-10-CM | POA: Diagnosis not present

## 2019-09-29 DIAGNOSIS — M1990 Unspecified osteoarthritis, unspecified site: Secondary | ICD-10-CM | POA: Diagnosis not present

## 2019-09-29 DIAGNOSIS — F419 Anxiety disorder, unspecified: Secondary | ICD-10-CM | POA: Diagnosis not present

## 2019-09-29 DIAGNOSIS — I10 Essential (primary) hypertension: Secondary | ICD-10-CM | POA: Diagnosis not present

## 2019-10-24 ENCOUNTER — Telehealth: Payer: Medicare Other

## 2019-11-12 ENCOUNTER — Telehealth: Payer: Self-pay | Admitting: Family Medicine

## 2019-11-12 DIAGNOSIS — Z0279 Encounter for issue of other medical certificate: Secondary | ICD-10-CM

## 2019-11-12 NOTE — Telephone Encounter (Signed)
Patient dropped off application/renewal for handicapped placard. Form is in Dr. Bebe Shaggy folder in front office for him to sign.

## 2019-11-19 NOTE — Telephone Encounter (Signed)
Patient is calling and wanted to see if handicapped form was ready and would like a call back, please advise. CB is [phone]

## 2019-11-22 NOTE — Telephone Encounter (Signed)
Patient is calling back regarding handicapped placard and wanted to see when will it be ready for pick up. Please call patient back for an update. CB is [phone].

## 2019-11-26 ENCOUNTER — Encounter: Payer: Self-pay | Admitting: Family Medicine

## 2019-11-26 NOTE — Telephone Encounter (Signed)
Pt calling back wondering if there is any update on this, please advise

## 2019-11-27 NOTE — Telephone Encounter (Signed)
Patient aware that we have received form and are waiting for the form to be signed. Phone call to patient to pick up when ready.

## 2019-11-28 NOTE — Telephone Encounter (Signed)
Patient aware that form ready for pick up ?

## 2019-11-28 NOTE — Telephone Encounter (Signed)
Patient's caregiver (Norm) is calling to see if the $20 fee can be waived for Handicap form that was completed. States patient is distraught over having to pay this fee. Please give Norm a call back at [phone].

## 2019-11-30 ENCOUNTER — Other Ambulatory Visit: Payer: Self-pay | Admitting: Family Medicine

## 2019-11-30 DIAGNOSIS — E1165 Type 2 diabetes mellitus with hyperglycemia: Secondary | ICD-10-CM | POA: Diagnosis not present

## 2019-11-30 DIAGNOSIS — I10 Essential (primary) hypertension: Secondary | ICD-10-CM | POA: Diagnosis not present

## 2019-11-30 DIAGNOSIS — E039 Hypothyroidism, unspecified: Secondary | ICD-10-CM

## 2019-11-30 DIAGNOSIS — M1990 Unspecified osteoarthritis, unspecified site: Secondary | ICD-10-CM | POA: Diagnosis not present

## 2019-11-30 DIAGNOSIS — E669 Obesity, unspecified: Secondary | ICD-10-CM | POA: Diagnosis not present

## 2019-12-03 ENCOUNTER — Telehealth: Payer: Self-pay

## 2019-12-03 NOTE — Telephone Encounter (Signed)
Please advise Patient's caregiver (Norm) is calling to see if the $20 fee can be waived for Handicap form that was completed. States patient is distraught over having to pay this fee. Please give Norm a call back at [phone].

## 2019-12-05 NOTE — Telephone Encounter (Signed)
Message sent to Dr. Ethelene Hal for decision on fee.

## 2020-01-03 ENCOUNTER — Other Ambulatory Visit: Payer: Self-pay | Admitting: Family Medicine

## 2020-01-03 DIAGNOSIS — I10 Essential (primary) hypertension: Secondary | ICD-10-CM

## 2020-01-03 NOTE — Telephone Encounter (Signed)
Wave the fee.

## 2020-01-09 NOTE — Telephone Encounter (Signed)
Patient declined OV and states that she will call back to let us know if she's able to find transportation

## 2020-01-19 DIAGNOSIS — E669 Obesity, unspecified: Secondary | ICD-10-CM | POA: Diagnosis not present

## 2020-01-19 DIAGNOSIS — E119 Type 2 diabetes mellitus without complications: Secondary | ICD-10-CM | POA: Diagnosis not present

## 2020-01-19 DIAGNOSIS — E039 Hypothyroidism, unspecified: Secondary | ICD-10-CM | POA: Diagnosis not present

## 2020-01-19 DIAGNOSIS — M1990 Unspecified osteoarthritis, unspecified site: Secondary | ICD-10-CM | POA: Diagnosis not present

## 2020-01-19 DIAGNOSIS — I10 Essential (primary) hypertension: Secondary | ICD-10-CM | POA: Diagnosis not present

## 2020-02-26 DIAGNOSIS — K51811 Other ulcerative colitis with rectal bleeding: Secondary | ICD-10-CM | POA: Diagnosis not present

## 2020-02-26 DIAGNOSIS — K449 Diaphragmatic hernia without obstruction or gangrene: Secondary | ICD-10-CM | POA: Diagnosis not present

## 2020-02-26 DIAGNOSIS — R197 Diarrhea, unspecified: Secondary | ICD-10-CM | POA: Diagnosis not present

## 2020-02-26 DIAGNOSIS — D509 Iron deficiency anemia, unspecified: Secondary | ICD-10-CM | POA: Diagnosis not present

## 2020-03-01 DIAGNOSIS — K922 Gastrointestinal hemorrhage, unspecified: Secondary | ICD-10-CM | POA: Diagnosis not present

## 2020-03-01 DIAGNOSIS — E039 Hypothyroidism, unspecified: Secondary | ICD-10-CM | POA: Diagnosis not present

## 2020-03-01 DIAGNOSIS — M1990 Unspecified osteoarthritis, unspecified site: Secondary | ICD-10-CM | POA: Diagnosis not present

## 2020-03-01 DIAGNOSIS — F32A Depression, unspecified: Secondary | ICD-10-CM | POA: Diagnosis not present

## 2020-03-01 DIAGNOSIS — E119 Type 2 diabetes mellitus without complications: Secondary | ICD-10-CM | POA: Diagnosis not present

## 2020-03-01 DIAGNOSIS — I10 Essential (primary) hypertension: Secondary | ICD-10-CM | POA: Diagnosis not present

## 2020-03-20 DIAGNOSIS — Z1159 Encounter for screening for other viral diseases: Secondary | ICD-10-CM | POA: Diagnosis not present

## 2020-03-21 ENCOUNTER — Ambulatory Visit: Payer: Medicare Other | Admitting: Family Medicine

## 2020-03-24 DIAGNOSIS — K6289 Other specified diseases of anus and rectum: Secondary | ICD-10-CM | POA: Diagnosis not present

## 2020-03-24 DIAGNOSIS — K5140 Inflammatory polyps of colon without complications: Secondary | ICD-10-CM | POA: Diagnosis not present

## 2020-03-24 DIAGNOSIS — K5130 Ulcerative (chronic) rectosigmoiditis without complications: Secondary | ICD-10-CM | POA: Diagnosis not present

## 2020-03-24 DIAGNOSIS — K5289 Other specified noninfective gastroenteritis and colitis: Secondary | ICD-10-CM | POA: Diagnosis not present

## 2020-03-24 DIAGNOSIS — R197 Diarrhea, unspecified: Secondary | ICD-10-CM | POA: Diagnosis not present

## 2020-03-24 DIAGNOSIS — K626 Ulcer of anus and rectum: Secondary | ICD-10-CM | POA: Diagnosis not present

## 2020-03-24 DIAGNOSIS — K635 Polyp of colon: Secondary | ICD-10-CM | POA: Diagnosis not present

## 2020-03-24 DIAGNOSIS — K621 Rectal polyp: Secondary | ICD-10-CM | POA: Diagnosis not present

## 2020-03-25 ENCOUNTER — Telehealth: Payer: Self-pay

## 2020-03-25 NOTE — Telephone Encounter (Signed)
Patient calling for a refill for her Lipitor.  I informed pt that that medication was not on her current medication list.  Pt stated that she should be taking something for Cholesterol.  I informed pt that I would send her message back to Provider of the day.  Pt would like a call back.  Please advise. CB#[phone]

## 2020-03-25 NOTE — Telephone Encounter (Signed)
Informed patient she needs labs and a ov in order to get medication. Pt states due to transportation issues she would not be able to do a f47f appt and she did not know how to work Mychart so she would like a telephone appt. Leslie Benton confirmed patient could have a telephone appointment as long as she agreed to get labs done when she could, pt states she would like to get labs done at [place] location. Pt then stated she had to hang up the phone and was unsure of what she would do she might forget about it all together. Informed patient we were here to help with whatever she decided, pt disconnected phone call.

## 2020-03-31 ENCOUNTER — Encounter: Payer: Self-pay | Admitting: Family Medicine

## 2020-03-31 DIAGNOSIS — K5140 Inflammatory polyps of colon without complications: Secondary | ICD-10-CM | POA: Diagnosis not present

## 2020-03-31 DIAGNOSIS — K5289 Other specified noninfective gastroenteritis and colitis: Secondary | ICD-10-CM | POA: Diagnosis not present

## 2020-04-01 DIAGNOSIS — I10 Essential (primary) hypertension: Secondary | ICD-10-CM | POA: Diagnosis not present

## 2020-04-01 DIAGNOSIS — E119 Type 2 diabetes mellitus without complications: Secondary | ICD-10-CM | POA: Diagnosis not present

## 2020-04-01 DIAGNOSIS — R32 Unspecified urinary incontinence: Secondary | ICD-10-CM | POA: Diagnosis not present

## 2020-04-01 DIAGNOSIS — E785 Hyperlipidemia, unspecified: Secondary | ICD-10-CM | POA: Diagnosis not present

## 2020-04-01 DIAGNOSIS — E039 Hypothyroidism, unspecified: Secondary | ICD-10-CM | POA: Diagnosis not present

## 2020-04-01 DIAGNOSIS — R0781 Pleurodynia: Secondary | ICD-10-CM | POA: Diagnosis not present

## 2020-05-01 DIAGNOSIS — R0781 Pleurodynia: Secondary | ICD-10-CM | POA: Diagnosis not present

## 2020-05-01 DIAGNOSIS — E1165 Type 2 diabetes mellitus with hyperglycemia: Secondary | ICD-10-CM | POA: Diagnosis not present

## 2020-05-01 DIAGNOSIS — I10 Essential (primary) hypertension: Secondary | ICD-10-CM | POA: Diagnosis not present

## 2020-05-16 ENCOUNTER — Other Ambulatory Visit: Payer: Self-pay | Admitting: Family Medicine

## 2020-05-16 DIAGNOSIS — F329 Major depressive disorder, single episode, unspecified: Secondary | ICD-10-CM

## 2020-05-16 DIAGNOSIS — F419 Anxiety disorder, unspecified: Secondary | ICD-10-CM

## 2020-05-16 NOTE — Telephone Encounter (Signed)
Caller Name: Leslie Benton, pt Call back phone #: [phone]  MEDICATION(S): QUEtiapine (SEROQUEL) 25 MG tablet Pt is out of medication and states she takes it PRN. She is very concerned about being out, potential bad weather, she does not drive, she is in a hardship and states she cannot come for appt. Pt asking for "emergency" refill. Pt requesting a call back to notify either way.  Last OV 04/30/2019 Last RX 01/18/2019 #60 2 refills No future visit scheduled. Tried to have pt schedule phone visit today (only has land line) and she said she can't just sit by the phone.  Has the patient contacted their pharmacy? Yes - RX is expired  Preferred Pharmacy:  [facility] Phone:  [phone]  Fax:  [phone]

## 2020-05-20 ENCOUNTER — Telehealth: Payer: Self-pay

## 2020-05-20 NOTE — Telephone Encounter (Signed)
Pt called in regards to her refill for Quetiapine 25mg ,  She need a PA in order to get the medication.  Medication was sent in on 05/16/20.  I started the PA today for pt.

## 2020-05-21 NOTE — Telephone Encounter (Signed)
Approvedon February 1 Effective from 05/20/2020 through 05/20/2021. Drug QUEtiapine Fumarate 25MG  tablets Form [employer] [place] Medicare Part D General Authorization Form

## 2020-05-21 NOTE — Telephone Encounter (Signed)
Leslie Benton [facility]) is calling to let the office know that the patients medication was approved for one year 05/20/2020-05/20/2021. She also said that she would fax the information.

## 2020-05-21 NOTE — Telephone Encounter (Signed)
LDM

## 2020-06-26 DIAGNOSIS — E1165 Type 2 diabetes mellitus with hyperglycemia: Secondary | ICD-10-CM | POA: Diagnosis not present

## 2020-06-26 DIAGNOSIS — I10 Essential (primary) hypertension: Secondary | ICD-10-CM | POA: Diagnosis not present

## 2020-06-26 DIAGNOSIS — M1990 Unspecified osteoarthritis, unspecified site: Secondary | ICD-10-CM | POA: Diagnosis not present

## 2020-06-26 DIAGNOSIS — F5104 Psychophysiologic insomnia: Secondary | ICD-10-CM | POA: Diagnosis not present

## 2020-07-08 DIAGNOSIS — D649 Anemia, unspecified: Secondary | ICD-10-CM | POA: Diagnosis not present

## 2020-07-08 DIAGNOSIS — F419 Anxiety disorder, unspecified: Secondary | ICD-10-CM | POA: Diagnosis not present

## 2020-07-08 DIAGNOSIS — Z741 Need for assistance with personal care: Secondary | ICD-10-CM | POA: Diagnosis not present

## 2020-07-08 DIAGNOSIS — K219 Gastro-esophageal reflux disease without esophagitis: Secondary | ICD-10-CM | POA: Diagnosis not present

## 2020-07-08 DIAGNOSIS — G4700 Insomnia, unspecified: Secondary | ICD-10-CM | POA: Diagnosis not present

## 2020-07-08 DIAGNOSIS — I10 Essential (primary) hypertension: Secondary | ICD-10-CM | POA: Diagnosis not present

## 2020-07-08 DIAGNOSIS — F329 Major depressive disorder, single episode, unspecified: Secondary | ICD-10-CM | POA: Diagnosis not present

## 2020-07-08 DIAGNOSIS — E1165 Type 2 diabetes mellitus with hyperglycemia: Secondary | ICD-10-CM | POA: Diagnosis not present

## 2020-07-08 DIAGNOSIS — Z7984 Long term (current) use of oral hypoglycemic drugs: Secondary | ICD-10-CM | POA: Diagnosis not present

## 2020-07-08 DIAGNOSIS — Z9119 Patient's noncompliance with other medical treatment and regimen: Secondary | ICD-10-CM | POA: Diagnosis not present

## 2020-07-08 DIAGNOSIS — I471 Supraventricular tachycardia: Secondary | ICD-10-CM | POA: Diagnosis not present

## 2020-07-08 DIAGNOSIS — E039 Hypothyroidism, unspecified: Secondary | ICD-10-CM | POA: Diagnosis not present

## 2020-07-08 DIAGNOSIS — M47812 Spondylosis without myelopathy or radiculopathy, cervical region: Secondary | ICD-10-CM | POA: Diagnosis not present

## 2020-07-08 DIAGNOSIS — Z9181 History of falling: Secondary | ICD-10-CM | POA: Diagnosis not present

## 2020-07-08 DIAGNOSIS — M150 Primary generalized (osteo)arthritis: Secondary | ICD-10-CM | POA: Diagnosis not present

## 2020-07-14 DIAGNOSIS — M47812 Spondylosis without myelopathy or radiculopathy, cervical region: Secondary | ICD-10-CM | POA: Diagnosis not present

## 2020-07-14 DIAGNOSIS — M150 Primary generalized (osteo)arthritis: Secondary | ICD-10-CM | POA: Diagnosis not present

## 2020-07-14 DIAGNOSIS — F419 Anxiety disorder, unspecified: Secondary | ICD-10-CM | POA: Diagnosis not present

## 2020-07-14 DIAGNOSIS — E1165 Type 2 diabetes mellitus with hyperglycemia: Secondary | ICD-10-CM | POA: Diagnosis not present

## 2020-07-14 DIAGNOSIS — I10 Essential (primary) hypertension: Secondary | ICD-10-CM | POA: Diagnosis not present

## 2020-07-14 DIAGNOSIS — D649 Anemia, unspecified: Secondary | ICD-10-CM | POA: Diagnosis not present

## 2020-07-15 DIAGNOSIS — M47812 Spondylosis without myelopathy or radiculopathy, cervical region: Secondary | ICD-10-CM | POA: Diagnosis not present

## 2020-07-15 DIAGNOSIS — D649 Anemia, unspecified: Secondary | ICD-10-CM | POA: Diagnosis not present

## 2020-07-15 DIAGNOSIS — M150 Primary generalized (osteo)arthritis: Secondary | ICD-10-CM | POA: Diagnosis not present

## 2020-07-15 DIAGNOSIS — F419 Anxiety disorder, unspecified: Secondary | ICD-10-CM | POA: Diagnosis not present

## 2020-07-15 DIAGNOSIS — E1165 Type 2 diabetes mellitus with hyperglycemia: Secondary | ICD-10-CM | POA: Diagnosis not present

## 2020-07-15 DIAGNOSIS — I10 Essential (primary) hypertension: Secondary | ICD-10-CM | POA: Diagnosis not present

## 2020-07-16 DIAGNOSIS — I10 Essential (primary) hypertension: Secondary | ICD-10-CM | POA: Diagnosis not present

## 2020-07-16 DIAGNOSIS — E1165 Type 2 diabetes mellitus with hyperglycemia: Secondary | ICD-10-CM | POA: Diagnosis not present

## 2020-08-07 DIAGNOSIS — Z741 Need for assistance with personal care: Secondary | ICD-10-CM | POA: Diagnosis not present

## 2020-08-07 DIAGNOSIS — E039 Hypothyroidism, unspecified: Secondary | ICD-10-CM | POA: Diagnosis not present

## 2020-08-07 DIAGNOSIS — Z9181 History of falling: Secondary | ICD-10-CM | POA: Diagnosis not present

## 2020-08-07 DIAGNOSIS — G4700 Insomnia, unspecified: Secondary | ICD-10-CM | POA: Diagnosis not present

## 2020-08-07 DIAGNOSIS — Z9119 Patient's noncompliance with other medical treatment and regimen: Secondary | ICD-10-CM | POA: Diagnosis not present

## 2020-08-07 DIAGNOSIS — E1165 Type 2 diabetes mellitus with hyperglycemia: Secondary | ICD-10-CM | POA: Diagnosis not present

## 2020-08-07 DIAGNOSIS — F329 Major depressive disorder, single episode, unspecified: Secondary | ICD-10-CM | POA: Diagnosis not present

## 2020-08-07 DIAGNOSIS — M150 Primary generalized (osteo)arthritis: Secondary | ICD-10-CM | POA: Diagnosis not present

## 2020-08-07 DIAGNOSIS — D649 Anemia, unspecified: Secondary | ICD-10-CM | POA: Diagnosis not present

## 2020-08-07 DIAGNOSIS — I10 Essential (primary) hypertension: Secondary | ICD-10-CM | POA: Diagnosis not present

## 2020-08-07 DIAGNOSIS — F419 Anxiety disorder, unspecified: Secondary | ICD-10-CM | POA: Diagnosis not present

## 2020-08-07 DIAGNOSIS — K219 Gastro-esophageal reflux disease without esophagitis: Secondary | ICD-10-CM | POA: Diagnosis not present

## 2020-08-07 DIAGNOSIS — I471 Supraventricular tachycardia: Secondary | ICD-10-CM | POA: Diagnosis not present

## 2020-08-07 DIAGNOSIS — Z7984 Long term (current) use of oral hypoglycemic drugs: Secondary | ICD-10-CM | POA: Diagnosis not present

## 2020-08-07 DIAGNOSIS — M47812 Spondylosis without myelopathy or radiculopathy, cervical region: Secondary | ICD-10-CM | POA: Diagnosis not present

## 2020-08-11 DIAGNOSIS — M47812 Spondylosis without myelopathy or radiculopathy, cervical region: Secondary | ICD-10-CM | POA: Diagnosis not present

## 2020-08-11 DIAGNOSIS — D649 Anemia, unspecified: Secondary | ICD-10-CM | POA: Diagnosis not present

## 2020-08-11 DIAGNOSIS — I10 Essential (primary) hypertension: Secondary | ICD-10-CM | POA: Diagnosis not present

## 2020-08-11 DIAGNOSIS — M150 Primary generalized (osteo)arthritis: Secondary | ICD-10-CM | POA: Diagnosis not present

## 2020-08-11 DIAGNOSIS — E1165 Type 2 diabetes mellitus with hyperglycemia: Secondary | ICD-10-CM | POA: Diagnosis not present

## 2020-08-11 DIAGNOSIS — F419 Anxiety disorder, unspecified: Secondary | ICD-10-CM | POA: Diagnosis not present

## 2020-08-25 ENCOUNTER — Other Ambulatory Visit: Payer: Self-pay | Admitting: Family Medicine

## 2020-08-25 DIAGNOSIS — F329 Major depressive disorder, single episode, unspecified: Secondary | ICD-10-CM

## 2020-08-25 DIAGNOSIS — F419 Anxiety disorder, unspecified: Secondary | ICD-10-CM

## 2020-09-02 DIAGNOSIS — F419 Anxiety disorder, unspecified: Secondary | ICD-10-CM | POA: Diagnosis not present

## 2020-09-02 DIAGNOSIS — D649 Anemia, unspecified: Secondary | ICD-10-CM | POA: Diagnosis not present

## 2020-09-02 DIAGNOSIS — M47812 Spondylosis without myelopathy or radiculopathy, cervical region: Secondary | ICD-10-CM | POA: Diagnosis not present

## 2020-09-02 DIAGNOSIS — E1165 Type 2 diabetes mellitus with hyperglycemia: Secondary | ICD-10-CM | POA: Diagnosis not present

## 2020-09-02 DIAGNOSIS — M150 Primary generalized (osteo)arthritis: Secondary | ICD-10-CM | POA: Diagnosis not present

## 2020-09-02 DIAGNOSIS — I10 Essential (primary) hypertension: Secondary | ICD-10-CM | POA: Diagnosis not present

## 2020-09-13 DIAGNOSIS — M6281 Muscle weakness (generalized): Secondary | ICD-10-CM | POA: Diagnosis not present

## 2020-09-13 DIAGNOSIS — N3941 Urge incontinence: Secondary | ICD-10-CM | POA: Diagnosis not present

## 2020-09-13 DIAGNOSIS — E119 Type 2 diabetes mellitus without complications: Secondary | ICD-10-CM | POA: Diagnosis not present

## 2020-09-13 DIAGNOSIS — I10 Essential (primary) hypertension: Secondary | ICD-10-CM | POA: Diagnosis not present

## 2020-10-07 DIAGNOSIS — M25512 Pain in left shoulder: Secondary | ICD-10-CM | POA: Diagnosis not present

## 2020-10-07 DIAGNOSIS — M542 Cervicalgia: Secondary | ICD-10-CM | POA: Diagnosis not present

## 2020-10-28 DIAGNOSIS — M25512 Pain in left shoulder: Secondary | ICD-10-CM | POA: Diagnosis not present

## 2020-11-05 DIAGNOSIS — F32A Depression, unspecified: Secondary | ICD-10-CM | POA: Diagnosis not present

## 2020-11-05 DIAGNOSIS — E119 Type 2 diabetes mellitus without complications: Secondary | ICD-10-CM | POA: Diagnosis not present

## 2020-11-05 DIAGNOSIS — I10 Essential (primary) hypertension: Secondary | ICD-10-CM | POA: Diagnosis not present

## 2020-11-05 DIAGNOSIS — F419 Anxiety disorder, unspecified: Secondary | ICD-10-CM | POA: Diagnosis not present

## 2020-12-01 ENCOUNTER — Other Ambulatory Visit: Payer: Self-pay | Admitting: Family Medicine

## 2020-12-01 DIAGNOSIS — E039 Hypothyroidism, unspecified: Secondary | ICD-10-CM

## 2020-12-05 ENCOUNTER — Ambulatory Visit: Payer: Medicare Other | Admitting: Family Medicine

## 2020-12-05 ENCOUNTER — Other Ambulatory Visit: Payer: Self-pay

## 2020-12-05 ENCOUNTER — Ambulatory Visit (INDEPENDENT_AMBULATORY_CARE_PROVIDER_SITE_OTHER): Payer: Medicare Other | Admitting: Family Medicine

## 2020-12-05 ENCOUNTER — Encounter: Payer: Self-pay | Admitting: Family Medicine

## 2020-12-05 ENCOUNTER — Other Ambulatory Visit (INDEPENDENT_AMBULATORY_CARE_PROVIDER_SITE_OTHER): Payer: Medicare Other

## 2020-12-05 VITALS — BP 134/80 | HR 93 | Temp 97.0°F | Ht 63.0 in

## 2020-12-05 DIAGNOSIS — F59 Unspecified behavioral syndromes associated with physiological disturbances and physical factors: Secondary | ICD-10-CM | POA: Diagnosis not present

## 2020-12-05 DIAGNOSIS — M5136 Other intervertebral disc degeneration, lumbar region: Secondary | ICD-10-CM | POA: Insufficient documentation

## 2020-12-05 DIAGNOSIS — Z9660 Presence of unspecified orthopedic joint implant: Secondary | ICD-10-CM | POA: Insufficient documentation

## 2020-12-05 DIAGNOSIS — E1165 Type 2 diabetes mellitus with hyperglycemia: Secondary | ICD-10-CM

## 2020-12-05 DIAGNOSIS — R4689 Other symptoms and signs involving appearance and behavior: Secondary | ICD-10-CM

## 2020-12-05 DIAGNOSIS — I10 Essential (primary) hypertension: Secondary | ICD-10-CM | POA: Diagnosis not present

## 2020-12-05 DIAGNOSIS — E039 Hypothyroidism, unspecified: Secondary | ICD-10-CM | POA: Diagnosis not present

## 2020-12-05 LAB — COMPREHENSIVE METABOLIC PANEL
ALT: 12 U/L (ref 0–35)
AST: 13 U/L (ref 0–37)
Albumin: 3.6 g/dL (ref 3.5–5.2)
Alkaline Phosphatase: 95 U/L (ref 39–117)
BUN: 20 mg/dL (ref 6–23)
CO2: 21 mEq/L (ref 19–32)
Calcium: 8.7 mg/dL (ref 8.4–10.5)
Chloride: 100 mEq/L (ref 96–112)
Creatinine, Ser: 0.84 mg/dL (ref 0.40–1.20)
GFR: 65.07 mL/min (ref >60.00)
Glucose, Bld: 317 mg/dL — ABNORMAL HIGH (ref 70–99)
Potassium: 5.2 mEq/L — ABNORMAL HIGH (ref 3.5–5.1)
Sodium: 135 mEq/L (ref 135–145)
Total Bilirubin: 0.3 mg/dL (ref 0.2–1.2)
Total Protein: 6.5 g/dL (ref 6.0–8.3)

## 2020-12-05 LAB — TSH: TSH: 0.71 u[IU]/mL (ref 0.35–5.50)

## 2020-12-05 LAB — CBC
HCT: 41.9 % (ref 36.0–46.0)
Hemoglobin: 13.6 g/dL (ref 12.0–15.0)
MCHC: 32.4 g/dL (ref 30.0–36.0)
MCV: 87.9 fl (ref 78.0–100.0)
Platelets: 373 10*3/uL (ref 150.0–400.0)
RBC: 4.77 Mil/uL (ref 3.87–5.11)
RDW: 14.5 % (ref 11.5–15.5)
WBC: 4.9 10*3/uL (ref 4.0–10.5)

## 2020-12-05 LAB — POCT URINALYSIS DIPSTICK
Bilirubin, UA: NEGATIVE
Blood, UA: NEGATIVE
Glucose, UA: POSITIVE — AB
Ketones, UA: NEGATIVE
Leukocytes, UA: NEGATIVE
Nitrite, UA: NEGATIVE
Protein, UA: POSITIVE — AB
Spec Grav, UA: 1.025 (ref 1.010–1.025)
Urobilinogen, UA: 0.2 E.U./dL
pH, UA: 6 (ref 5.0–8.0)

## 2020-12-05 LAB — HEMOGLOBIN A1C: Hgb A1c MFr Bld: 13.6 % — ABNORMAL HIGH (ref 4.6–6.5)

## 2020-12-05 NOTE — Progress Notes (Signed)
a general evaluaiton.  [facility] [address] [place] [address] Dept: [phone] Dept Fax: [phone]  Office Visit  Subjective:    Patient ID: Leslie Benton, female    DOB: [birth year], 80 y.o..   MRN: [id number]  Chief Complaint  Patient presents with   Follow-up    Pt c/o lethargy, congestion, runny nose x1 wk.     History of Present Illness:  Patient is in today for a general evaluation. It has been almost 2 years since her last clinic visit. She states she is here because her daughter made her come. Leslie Benton is accompanied by a CNA Leslie Benton) with a home care agency. She notes that the family had been concerned due to some behavioral changes, including increased agitation in the past few weeks. Their thoughts were that she might have a UTI, though Leslie Benton denies any urinary frequency, hesitancy, or dysuria. She does wear adult diapers and her aide notes occasional nocturia.  Leslie Benton is typically unwilling to come for office visits. She has a history of Type 2 diabetes. She is unwilling to check her blood sugars at home. She apparently was previously on insulin, but no longer uses this. She reports Dr. Ethelene Benton wanted her to take metformin twice a day, but that when she did her health worsened. She takes it only once a day. Leslie Benton does have a nurse practitioner and possibly a physician that comes ot her home, however, we do not have records of any care they may be providing to her.  Leslie Benton has a history of hypertension. She is currently on lisinopril.  Leslie Benton has a history of hypothyroidism. She is on levothyroxine. She complains of fatigue and low energy.  Past Medical History: Patient Active Problem List   Diagnosis Date Noted   History of artificial joint 12/05/2020   Degeneration of intervertebral disc of lumbar region 12/05/2020   Xerosis cutis 04/30/2019   Hospital discharge follow-up 01/18/2019    SVT (supraventricular tachycardia) (HCC)    Elevated troponin 01/06/2019   Diarrhea 01/05/2019   Non-compliant behavior 12/20/2018   Need for influenza vaccination 12/20/2018   Post-nasal drip 07/24/2018   Anxiety 07/04/2018   Uncontrolled type 2 diabetes mellitus with hyperglycemia ([place]) 06/06/2018   Chronic fatigue 01/30/2018   Dyslipidemia 10/17/2017   Acute eczematoid otitis externa of left ear 07/26/2017   Fungal dermatitis 07/26/2017   Cervical spondylosis without myelopathy 05/29/2015   Cervicogenic headache 05/29/2015   Obesity 01/14/2015   OA (osteoarthritis) of knee 09/23/2014   Precordial chest pain 08/08/2014   Tachycardia    Anemia 07/02/2014   Depression 06/08/2012   KNEE PAIN, RIGHT 07/15/2008   COLONIC POLYPS, HX OF 02/07/2008   LOW BACK PAIN 08/04/2007   Hypothyroidism 10/14/2006   Essential hypertension 10/14/2006   GERD 10/14/2006   Past Surgical History:  Procedure Laterality Date   BIOPSY  01/12/2019   Procedure: BIOPSY;  Surgeon: Otis Brace, MD;  Location: [facility];  Service: Gastroenterology;;   Otho Darner SIGMOIDOSCOPY N/A 01/12/2019   Procedure: FLEXIBLE SIGMOIDOSCOPY;  Surgeon: Otis Brace, MD;  Location: [facility];  Service: Gastroenterology;  Laterality: N/A;   HEMORRHOID SURGERY     Left knee arthroscopic  April 2014   Dr. Durward Fortes   PARTIAL KNEE ARTHROPLASTY Left 09/23/2014   Procedure: LEFT UNICOMPARTMENTAL KNEE;  Surgeon: [name], MD;  Location: [facility];  Service: Orthopedics;  Laterality: Left;   TONSILLECTOMY     TUBAL LIGATION  Family History  Problem Relation Age of Onset   Heart attack Father 31   Diabetes Father    Kidney failure Mother    Cancer Sister        endometrial   Cancer Brother        Adrenal   Outpatient Medications Prior to Visit  Medication Sig Dispense Refill   atorvastatin (LIPITOR) 10 MG tablet Take 10 mg by mouth daily.     CRANBERRY PO Take by mouth.     ibuprofen (ADVIL,MOTRIN)  200 MG tablet Take 400 mg by mouth as needed for headache or moderate pain.      levothyroxine (SYNTHROID) 175 MCG tablet TAKE 1 TABLET(175 MCG) BY MOUTH DAILY BEFORE BREAKFAST 90 tablet 1   lisinopril (ZESTRIL) 10 MG tablet TAKE 1 TABLET(10 MG) BY MOUTH DAILY 90 tablet 0   loratadine (CLARITIN) 10 MG tablet Take 10 mg by mouth daily as needed for allergies or rhinitis.      metFORMIN (GLUCOPHAGE) 1000 MG tablet Take 1 tablet (1,000 mg total) by mouth 2 (two) times daily with a meal. 180 tablet 3   Multiple Vitamin (MULTIVITAMIN WITH MINERALS) TABS tablet Take 1 tablet by mouth daily.     QUEtiapine (SEROQUEL) 25 MG tablet TAKE 1 TO 2 TABLETS BY MOUTH AT NIGHT AS NEEDED 60 tablet 2   Blood Glucose Monitoring Suppl (ONE TOUCH ULTRA 2) w/Device KIT Use to test blood sugar 1-2 times daily. (Patient not taking: No sig reported) 1 kit 0   citalopram (CELEXA) 10 MG tablet TAKE 1 TABLET BY MOUTH DAILY FOR 1 WEEK. INCREASE TO 2 DAILY (Patient not taking: Reported on 12/05/2020) 60 tablet 1   Continuous Blood Gluc Receiver (FREESTYLE LIBRE 14 DAY READER) DEVI 1 each by Does not apply route daily. Use as directed on box. (Patient not taking: No sig reported) 1 each 3   Continuous Blood Gluc Sensor (FREESTYLE LIBRE 14 DAY SENSOR) MISC 1 each by Does not apply route daily. Use as directed. (Patient not taking: No sig reported) 1 each 3   fluticasone (FLONASE) 50 MCG/ACT nasal spray SHAKE LIQUID AND USE 2 SPRAYS IN EACH NOSTRIL DAILY (Patient not taking: No sig reported) 16 g 2   glucose blood test strip USE TO TEST BLOOD SUGAR 1 TO 2 TIMES DAILY (Patient not taking: Reported on 12/05/2020) 100 each 12   hydrOXYzine (ATARAX/VISTARIL) 10 MG tablet Take 1/2 half tab nightly as needed for itching. (Patient not taking: Reported on 12/05/2020) 30 tablet 0   insulin aspart (NOVOLOG) 100 UNIT/ML FlexPen Inject 8 Units into the skin 3 (three) times daily with meals. (Patient not taking: No sig reported) 15 mL 0   insulin  aspart (NOVOLOG) 100 UNIT/ML FlexPen Sliding scale: CBG < 70: Implement Hypoglycemia Standing Orders and refer to Hypoglycemia Standing Orders sidebar report  CBG 70 - 120: 0 units  CBG 121 - 150: 2 units  CBG 151 - 200: 3 units  CBG 201 - 250: 5 units  CBG 251 - 300: 8 units  CBG 301 - 350: 11 units  CBG 351 - 400: 15 units  CBG > 400 Notify facility MD (Patient not taking: No sig reported) 15 mL 11   Insulin Detemir (LEVEMIR) 100 UNIT/ML Pen Inject 20 Units into the skin daily. (Patient not taking: No sig reported) 15 mL 0   Lancets (ONETOUCH ULTRASOFT) lancets Use to test blood sugars 1-2 times daily. (Patient not taking: No sig reported) 100 each 12   mesalamine (  CANASA) 1000 MG suppository Place 1 suppository (1,000 mg total) rectally 2 (two) times daily. 60 suppository 0   mesalamine (PENTASA) 250 MG CR capsule Take 4 capsules (1,000 mg total) by mouth 4 (four) times daily. 480 capsule 0   metoprolol succinate (TOPROL-XL) 50 MG 24 hr tablet Take 1 tablet (50 mg total) by mouth daily. Take with or immediately following a meal. (Patient not taking: No sig reported) 90 tablet 3   metoprolol tartrate (LOPRESSOR) 25 MG tablet Take 1 tablet (25 mg total) by mouth 4 (four) times daily. 120 tablet 0   predniSONE (DELTASONE) 10 MG tablet Taper dose: 40 mg p.o. daily for 1 week, then 30 mg p.o. daily for 1 week, and 20 mg p.o. daily for 1 week, then 10 mg p.o. daily for 1 week then discontinue, zero refills (Patient not taking: No sig reported) 70 tablet 0   No facility-administered medications prior to visit.   Allergies  Allergen Reactions   Niacin And Related Hives and Swelling    No anaphalaxis, but strong reactions.   Lactose Intolerance (Gi)     "bad taste in mouth"   Amoxicillin Swelling    hives   Lactase    Penicillins Swelling    Hives With all cillins    Objective:   Today's Vitals   12/05/20 1150  BP: 134/80  Pulse: 93  Temp: (!) 97 F (36.1 C)  TempSrc: Temporal   SpO2: 95%  Height: $Remove'5\' 3"'ZqpNFut$  (1.6 m)   Body mass index is 29.23 kg/m.   General: Well developed, well nourished. No acute distress. Lungs: Clear to auscultation bilaterally. No wheezing, rales or rhonchi. CV: RRR with a holosystolic murmurs. Pulses 2+ bilaterally. Extremities: No edema noted. Psych: Alert and oriented. Patient very conversant, esp. about past interactions with the medical system. Normal mood and affect.  Health Maintenance Due  Topic Date Due   COVID-19 Vaccine (1) Never done   Zoster Vaccines- Shingrix (1 of 2) Never done   DEXA SCAN  Never done   OPHTHALMOLOGY EXAM  05/03/2015   HEMOGLOBIN A1C  04/12/2019   FOOT EXAM  06/20/2019   INFLUENZA VACCINE  11/17/2020     Assessment & Plan:   1. 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 Bohac's daughter did not accompany her for the visit. Certainly a behavioral change could have been precipitated by a UTI. We will check a UA to assess for this. I will also check a CBC to assess for potential infection. I recommend she be seen by her PCP to re-establish care and routine monitoring for her health conditions.  - Urinalysis w microscopic + reflex cultur - CBC; Future  2. Essential hypertension Blood pressure is at goal on current lisinopril and metoprolol. Although there is a heart murmur present, there is no sign of heart failure.   3. Acquired hypothyroidism Ms. Nong has not had a TSH through our system in 2 years. If she were under dosed on her levothyroxine, that could account for her changes in energy. We will check a TSH  - TSH; Future  4. Uncontrolled type 2 diabetes mellitus with hyperglycemia ([place]) It has been 2 years since MS. Stallings has had a check on her diabetes. Uncontrolled diabetes could account for some of her current concerns. We will check her A1c and a CMP.  - Hemoglobin  A1c; Future - Comprehensive metabolic panel; Future  Haydee Salter, MD

## 2020-12-06 LAB — URINALYSIS W MICROSCOPIC + REFLEX CULTURE
Bacteria, UA: NONE SEEN /HPF
Bilirubin Urine: NEGATIVE
Hgb urine dipstick: NEGATIVE
Hyaline Cast: NONE SEEN /LPF
Ketones, ur: NEGATIVE
Leukocyte Esterase: NEGATIVE
Nitrites, Initial: NEGATIVE
Protein, ur: NEGATIVE
RBC / HPF: NONE SEEN /HPF (ref 0–2)
Specific Gravity, Urine: 1.031 (ref 1.001–1.035)
pH: 5.5 (ref 5.0–8.0)

## 2020-12-06 LAB — NO CULTURE INDICATED

## 2020-12-19 DIAGNOSIS — F419 Anxiety disorder, unspecified: Secondary | ICD-10-CM | POA: Diagnosis not present

## 2020-12-19 DIAGNOSIS — E119 Type 2 diabetes mellitus without complications: Secondary | ICD-10-CM | POA: Diagnosis not present

## 2020-12-19 DIAGNOSIS — G4700 Insomnia, unspecified: Secondary | ICD-10-CM | POA: Diagnosis not present

## 2020-12-19 DIAGNOSIS — I10 Essential (primary) hypertension: Secondary | ICD-10-CM | POA: Diagnosis not present

## 2021-02-20 DIAGNOSIS — E119 Type 2 diabetes mellitus without complications: Secondary | ICD-10-CM | POA: Diagnosis not present

## 2021-02-20 DIAGNOSIS — I10 Essential (primary) hypertension: Secondary | ICD-10-CM | POA: Diagnosis not present

## 2021-02-20 DIAGNOSIS — M1990 Unspecified osteoarthritis, unspecified site: Secondary | ICD-10-CM | POA: Diagnosis not present

## 2021-02-20 DIAGNOSIS — G4700 Insomnia, unspecified: Secondary | ICD-10-CM | POA: Diagnosis not present

## 2021-05-08 DIAGNOSIS — R6889 Other general symptoms and signs: Secondary | ICD-10-CM | POA: Diagnosis not present

## 2021-05-08 DIAGNOSIS — E119 Type 2 diabetes mellitus without complications: Secondary | ICD-10-CM | POA: Diagnosis not present

## 2021-05-08 DIAGNOSIS — I10 Essential (primary) hypertension: Secondary | ICD-10-CM | POA: Diagnosis not present

## 2021-05-08 DIAGNOSIS — M1990 Unspecified osteoarthritis, unspecified site: Secondary | ICD-10-CM | POA: Diagnosis not present

## 2021-06-01 DIAGNOSIS — M1909 Primary osteoarthritis, other specified site: Secondary | ICD-10-CM | POA: Diagnosis not present

## 2021-06-01 DIAGNOSIS — E119 Type 2 diabetes mellitus without complications: Secondary | ICD-10-CM | POA: Diagnosis not present

## 2021-06-01 DIAGNOSIS — F32A Depression, unspecified: Secondary | ICD-10-CM | POA: Diagnosis not present

## 2021-06-01 DIAGNOSIS — K219 Gastro-esophageal reflux disease without esophagitis: Secondary | ICD-10-CM | POA: Diagnosis not present

## 2021-06-01 DIAGNOSIS — J302 Other seasonal allergic rhinitis: Secondary | ICD-10-CM | POA: Diagnosis not present

## 2021-06-01 DIAGNOSIS — R32 Unspecified urinary incontinence: Secondary | ICD-10-CM | POA: Diagnosis not present

## 2021-06-01 DIAGNOSIS — G4700 Insomnia, unspecified: Secondary | ICD-10-CM | POA: Diagnosis not present

## 2021-06-01 DIAGNOSIS — F419 Anxiety disorder, unspecified: Secondary | ICD-10-CM | POA: Diagnosis not present

## 2021-06-01 DIAGNOSIS — Z9181 History of falling: Secondary | ICD-10-CM | POA: Diagnosis not present

## 2021-06-01 DIAGNOSIS — E785 Hyperlipidemia, unspecified: Secondary | ICD-10-CM | POA: Diagnosis not present

## 2021-06-01 DIAGNOSIS — E669 Obesity, unspecified: Secondary | ICD-10-CM | POA: Diagnosis not present

## 2021-06-01 DIAGNOSIS — E039 Hypothyroidism, unspecified: Secondary | ICD-10-CM | POA: Diagnosis not present

## 2021-06-01 DIAGNOSIS — I10 Essential (primary) hypertension: Secondary | ICD-10-CM | POA: Diagnosis not present

## 2021-06-01 DIAGNOSIS — M47812 Spondylosis without myelopathy or radiculopathy, cervical region: Secondary | ICD-10-CM | POA: Diagnosis not present

## 2021-06-01 DIAGNOSIS — D649 Anemia, unspecified: Secondary | ICD-10-CM | POA: Diagnosis not present

## 2021-06-01 DIAGNOSIS — Z7984 Long term (current) use of oral hypoglycemic drugs: Secondary | ICD-10-CM | POA: Diagnosis not present

## 2021-06-27 DIAGNOSIS — R32 Unspecified urinary incontinence: Secondary | ICD-10-CM | POA: Diagnosis not present

## 2021-06-27 DIAGNOSIS — F32A Depression, unspecified: Secondary | ICD-10-CM | POA: Diagnosis not present

## 2021-06-27 DIAGNOSIS — R4689 Other symptoms and signs involving appearance and behavior: Secondary | ICD-10-CM | POA: Diagnosis not present

## 2021-06-27 DIAGNOSIS — I10 Essential (primary) hypertension: Secondary | ICD-10-CM | POA: Diagnosis not present

## 2021-06-27 DIAGNOSIS — F419 Anxiety disorder, unspecified: Secondary | ICD-10-CM | POA: Diagnosis not present

## 2021-08-08 DIAGNOSIS — F419 Anxiety disorder, unspecified: Secondary | ICD-10-CM | POA: Diagnosis not present

## 2021-08-08 DIAGNOSIS — E119 Type 2 diabetes mellitus without complications: Secondary | ICD-10-CM | POA: Diagnosis not present

## 2021-08-08 DIAGNOSIS — I10 Essential (primary) hypertension: Secondary | ICD-10-CM | POA: Diagnosis not present

## 2021-09-08 DIAGNOSIS — L989 Disorder of the skin and subcutaneous tissue, unspecified: Secondary | ICD-10-CM | POA: Diagnosis not present

## 2021-09-08 DIAGNOSIS — E119 Type 2 diabetes mellitus without complications: Secondary | ICD-10-CM | POA: Diagnosis not present

## 2021-09-08 DIAGNOSIS — I10 Essential (primary) hypertension: Secondary | ICD-10-CM | POA: Diagnosis not present

## 2021-09-08 DIAGNOSIS — F419 Anxiety disorder, unspecified: Secondary | ICD-10-CM | POA: Diagnosis not present

## 2021-09-24 DIAGNOSIS — E1165 Type 2 diabetes mellitus with hyperglycemia: Secondary | ICD-10-CM | POA: Diagnosis not present

## 2021-09-24 DIAGNOSIS — E079 Disorder of thyroid, unspecified: Secondary | ICD-10-CM | POA: Diagnosis not present

## 2021-09-24 DIAGNOSIS — I10 Essential (primary) hypertension: Secondary | ICD-10-CM | POA: Diagnosis not present

## 2021-10-27 DIAGNOSIS — F419 Anxiety disorder, unspecified: Secondary | ICD-10-CM | POA: Diagnosis not present

## 2021-10-27 DIAGNOSIS — I10 Essential (primary) hypertension: Secondary | ICD-10-CM | POA: Diagnosis not present

## 2021-10-27 DIAGNOSIS — E119 Type 2 diabetes mellitus without complications: Secondary | ICD-10-CM | POA: Diagnosis not present

## 2021-10-27 DIAGNOSIS — G4700 Insomnia, unspecified: Secondary | ICD-10-CM | POA: Diagnosis not present

## 2021-12-25 DIAGNOSIS — E119 Type 2 diabetes mellitus without complications: Secondary | ICD-10-CM | POA: Diagnosis not present

## 2021-12-25 DIAGNOSIS — F419 Anxiety disorder, unspecified: Secondary | ICD-10-CM | POA: Diagnosis not present

## 2021-12-25 DIAGNOSIS — I10 Essential (primary) hypertension: Secondary | ICD-10-CM | POA: Diagnosis not present

## 2022-02-03 DIAGNOSIS — Z23 Encounter for immunization: Secondary | ICD-10-CM | POA: Diagnosis not present

## 2022-02-16 DIAGNOSIS — E119 Type 2 diabetes mellitus without complications: Secondary | ICD-10-CM | POA: Diagnosis not present

## 2022-02-16 DIAGNOSIS — F32A Depression, unspecified: Secondary | ICD-10-CM | POA: Diagnosis not present

## 2022-02-16 DIAGNOSIS — F419 Anxiety disorder, unspecified: Secondary | ICD-10-CM | POA: Diagnosis not present

## 2022-03-16 DIAGNOSIS — R197 Diarrhea, unspecified: Secondary | ICD-10-CM | POA: Diagnosis not present

## 2022-03-16 DIAGNOSIS — E039 Hypothyroidism, unspecified: Secondary | ICD-10-CM | POA: Diagnosis not present

## 2022-03-16 DIAGNOSIS — Z7409 Other reduced mobility: Secondary | ICD-10-CM | POA: Diagnosis not present

## 2022-03-16 DIAGNOSIS — E1165 Type 2 diabetes mellitus with hyperglycemia: Secondary | ICD-10-CM | POA: Diagnosis not present

## 2022-03-16 DIAGNOSIS — K51811 Other ulcerative colitis with rectal bleeding: Secondary | ICD-10-CM | POA: Diagnosis not present

## 2022-03-16 DIAGNOSIS — R531 Weakness: Secondary | ICD-10-CM | POA: Diagnosis not present

## 2022-03-16 DIAGNOSIS — Z9989 Dependence on other enabling machines and devices: Secondary | ICD-10-CM | POA: Diagnosis not present

## 2022-03-23 DIAGNOSIS — I10 Essential (primary) hypertension: Secondary | ICD-10-CM | POA: Diagnosis not present

## 2022-03-23 DIAGNOSIS — E039 Hypothyroidism, unspecified: Secondary | ICD-10-CM | POA: Diagnosis not present

## 2022-03-23 DIAGNOSIS — E1165 Type 2 diabetes mellitus with hyperglycemia: Secondary | ICD-10-CM | POA: Diagnosis not present

## 2022-03-23 DIAGNOSIS — E669 Obesity, unspecified: Secondary | ICD-10-CM | POA: Diagnosis not present

## 2022-05-26 DIAGNOSIS — I10 Essential (primary) hypertension: Secondary | ICD-10-CM | POA: Diagnosis not present

## 2022-05-26 DIAGNOSIS — E1165 Type 2 diabetes mellitus with hyperglycemia: Secondary | ICD-10-CM | POA: Diagnosis not present

## 2022-05-26 DIAGNOSIS — F32A Depression, unspecified: Secondary | ICD-10-CM | POA: Diagnosis not present

## 2022-05-26 DIAGNOSIS — F419 Anxiety disorder, unspecified: Secondary | ICD-10-CM | POA: Diagnosis not present

## 2022-05-26 DIAGNOSIS — E119 Type 2 diabetes mellitus without complications: Secondary | ICD-10-CM | POA: Diagnosis not present

## 2022-06-08 ENCOUNTER — Telehealth: Payer: Self-pay | Admitting: Family Medicine

## 2022-06-08 NOTE — Telephone Encounter (Signed)
Caller states: -She received a referral from [facility], Truitt Merle, Director - In order for them to be eligible for palliative care they need to either have active cancer diagnosis, 12 month life expectancy, Chronic illness, or frequent ED/Hospital admissions  - If they have any of the above, she needs confirmation from PCP on if he agrees with referral   For more information or to confirm, Marzetta Board can be reached at [phone] option 2

## 2022-06-10 ENCOUNTER — Telehealth: Payer: Self-pay | Admitting: Family Medicine

## 2022-06-10 NOTE — Telephone Encounter (Signed)
Channing pizzuto # [phone] from [facility] called and stated she need a palliative referral for the pt

## 2022-06-11 ENCOUNTER — Telehealth: Payer: Self-pay

## 2022-06-11 NOTE — Telephone Encounter (Signed)
(  6:14 pm) PC SW scheduled an initial visit for patient with her daughter. Initial visit is scheduled with the palliative care clinical team for 06/14/22 @ 3:30 pm.

## 2022-06-11 NOTE — Telephone Encounter (Signed)
Duplicate message referral approval or denial pending until PCP is back in the office.

## 2022-06-14 ENCOUNTER — Inpatient Hospital Stay (HOSPITAL_COMMUNITY)
Admission: EM | Admit: 2022-06-14 | Discharge: 2022-06-17 | DRG: 871 | Disposition: A | Discharge status: Home Health | Payer: Medicare Other | Attending: Internal Medicine | Admitting: Internal Medicine

## 2022-06-14 ENCOUNTER — Emergency Department (HOSPITAL_COMMUNITY): Payer: Medicare Other

## 2022-06-14 ENCOUNTER — Encounter: Payer: Self-pay | Admitting: Hematology & Oncology

## 2022-06-14 ENCOUNTER — Encounter (HOSPITAL_COMMUNITY): Payer: Self-pay

## 2022-06-14 DIAGNOSIS — F32A Depression, unspecified: Secondary | ICD-10-CM | POA: Diagnosis not present

## 2022-06-14 DIAGNOSIS — L89151 Pressure ulcer of sacral region, stage 1: Secondary | ICD-10-CM | POA: Diagnosis present

## 2022-06-14 DIAGNOSIS — K449 Diaphragmatic hernia without obstruction or gangrene: Secondary | ICD-10-CM | POA: Diagnosis not present

## 2022-06-14 DIAGNOSIS — R4182 Altered mental status, unspecified: Secondary | ICD-10-CM | POA: Diagnosis not present

## 2022-06-14 DIAGNOSIS — R000 Tachycardia, unspecified: Secondary | ICD-10-CM | POA: Diagnosis not present

## 2022-06-14 DIAGNOSIS — R339 Retention of urine, unspecified: Secondary | ICD-10-CM | POA: Diagnosis not present

## 2022-06-14 DIAGNOSIS — R6520 Severe sepsis without septic shock: Secondary | ICD-10-CM | POA: Diagnosis present

## 2022-06-14 DIAGNOSIS — D84821 Immunodeficiency due to drugs: Secondary | ICD-10-CM | POA: Diagnosis present

## 2022-06-14 DIAGNOSIS — I6381 Other cerebral infarction due to occlusion or stenosis of small artery: Secondary | ICD-10-CM | POA: Diagnosis not present

## 2022-06-14 DIAGNOSIS — A419 Sepsis, unspecified organism: Secondary | ICD-10-CM | POA: Diagnosis not present

## 2022-06-14 DIAGNOSIS — I10 Essential (primary) hypertension: Secondary | ICD-10-CM | POA: Diagnosis not present

## 2022-06-14 DIAGNOSIS — E039 Hypothyroidism, unspecified: Secondary | ICD-10-CM | POA: Diagnosis present

## 2022-06-14 DIAGNOSIS — Z96652 Presence of left artificial knee joint: Secondary | ICD-10-CM | POA: Diagnosis present

## 2022-06-14 DIAGNOSIS — K219 Gastro-esophageal reflux disease without esophagitis: Secondary | ICD-10-CM | POA: Diagnosis not present

## 2022-06-14 DIAGNOSIS — E1165 Type 2 diabetes mellitus with hyperglycemia: Secondary | ICD-10-CM | POA: Diagnosis present

## 2022-06-14 DIAGNOSIS — E871 Hypo-osmolality and hyponatremia: Secondary | ICD-10-CM | POA: Diagnosis not present

## 2022-06-14 DIAGNOSIS — F419 Anxiety disorder, unspecified: Secondary | ICD-10-CM | POA: Diagnosis not present

## 2022-06-14 DIAGNOSIS — F0394 Unspecified dementia, unspecified severity, with anxiety: Secondary | ICD-10-CM | POA: Diagnosis present

## 2022-06-14 DIAGNOSIS — K5190 Ulcerative colitis, unspecified, without complications: Secondary | ICD-10-CM | POA: Diagnosis present

## 2022-06-14 DIAGNOSIS — Z66 Do not resuscitate: Secondary | ICD-10-CM | POA: Diagnosis present

## 2022-06-14 DIAGNOSIS — E878 Other disorders of electrolyte and fluid balance, not elsewhere classified: Secondary | ICD-10-CM | POA: Diagnosis present

## 2022-06-14 DIAGNOSIS — R5382 Chronic fatigue, unspecified: Secondary | ICD-10-CM | POA: Diagnosis present

## 2022-06-14 DIAGNOSIS — Z79899 Other long term (current) drug therapy: Secondary | ICD-10-CM

## 2022-06-14 DIAGNOSIS — W1830XA Fall on same level, unspecified, initial encounter: Secondary | ICD-10-CM | POA: Diagnosis present

## 2022-06-14 DIAGNOSIS — J9811 Atelectasis: Secondary | ICD-10-CM | POA: Diagnosis not present

## 2022-06-14 DIAGNOSIS — Z8249 Family history of ischemic heart disease and other diseases of the circulatory system: Secondary | ICD-10-CM

## 2022-06-14 DIAGNOSIS — R059 Cough, unspecified: Secondary | ICD-10-CM | POA: Diagnosis not present

## 2022-06-14 DIAGNOSIS — Z1152 Encounter for screening for COVID-19: Secondary | ICD-10-CM | POA: Diagnosis not present

## 2022-06-14 DIAGNOSIS — Z888 Allergy status to other drugs, medicaments and biological substances status: Secondary | ICD-10-CM

## 2022-06-14 DIAGNOSIS — Z833 Family history of diabetes mellitus: Secondary | ICD-10-CM

## 2022-06-14 DIAGNOSIS — Z91011 Allergy to milk products: Secondary | ICD-10-CM

## 2022-06-14 DIAGNOSIS — Z880 Allergy status to penicillin: Secondary | ICD-10-CM

## 2022-06-14 DIAGNOSIS — J189 Pneumonia, unspecified organism: Secondary | ICD-10-CM | POA: Diagnosis not present

## 2022-06-14 DIAGNOSIS — Z7984 Long term (current) use of oral hypoglycemic drugs: Secondary | ICD-10-CM

## 2022-06-14 DIAGNOSIS — Z79890 Hormone replacement therapy (postmenopausal): Secondary | ICD-10-CM

## 2022-06-14 DIAGNOSIS — E785 Hyperlipidemia, unspecified: Secondary | ICD-10-CM | POA: Diagnosis present

## 2022-06-14 DIAGNOSIS — R0902 Hypoxemia: Secondary | ICD-10-CM | POA: Diagnosis present

## 2022-06-14 DIAGNOSIS — Z881 Allergy status to other antibiotic agents status: Secondary | ICD-10-CM

## 2022-06-14 DIAGNOSIS — F0393 Unspecified dementia, unspecified severity, with mood disturbance: Secondary | ICD-10-CM | POA: Diagnosis not present

## 2022-06-14 DIAGNOSIS — G9341 Metabolic encephalopathy: Secondary | ICD-10-CM | POA: Diagnosis not present

## 2022-06-14 DIAGNOSIS — J90 Pleural effusion, not elsewhere classified: Secondary | ICD-10-CM | POA: Diagnosis not present

## 2022-06-14 DIAGNOSIS — K209 Esophagitis, unspecified without bleeding: Secondary | ICD-10-CM | POA: Diagnosis present

## 2022-06-14 LAB — BASIC METABOLIC PANEL
Anion gap: 12 (ref 5–15)
BUN: 18 mg/dL (ref 8–23)
CO2: 24 mmol/L (ref 22–32)
Calcium: 8.4 mg/dL — ABNORMAL LOW (ref 8.9–10.3)
Chloride: 95 mmol/L — ABNORMAL LOW (ref 98–111)
Creatinine, Ser: 1.09 mg/dL — ABNORMAL HIGH (ref 0.44–1.00)
GFR, Estimated: 50 mL/min — ABNORMAL LOW (ref >60)
Glucose, Bld: 228 mg/dL — ABNORMAL HIGH (ref 70–99)
Potassium: 4.3 mmol/L (ref 3.5–5.1)
Sodium: 131 mmol/L — ABNORMAL LOW (ref 135–145)

## 2022-06-14 LAB — CBC WITH DIFFERENTIAL/PLATELET
Abs Immature Granulocytes: 0 10*3/uL (ref 0.00–0.07)
Basophils Absolute: 0.2 10*3/uL — ABNORMAL HIGH (ref 0.0–0.1)
Basophils Relative: 1 %
Eosinophils Absolute: 0 10*3/uL (ref 0.0–0.5)
Eosinophils Relative: 0 %
HCT: 36.5 % (ref 36.0–46.0)
Hemoglobin: 12.1 g/dL (ref 12.0–15.0)
Lymphocytes Relative: 1 %
Lymphs Abs: 0.2 10*3/uL — ABNORMAL LOW (ref 0.7–4.0)
MCH: 28.9 pg (ref 26.0–34.0)
MCHC: 33.2 g/dL (ref 30.0–36.0)
MCV: 87.3 fL (ref 80.0–100.0)
Monocytes Absolute: 0.5 10*3/uL (ref 0.1–1.0)
Monocytes Relative: 3 %
Neutro Abs: 15.7 10*3/uL — ABNORMAL HIGH (ref 1.7–7.7)
Neutrophils Relative %: 95 %
Platelets: 348 10*3/uL (ref 150–400)
RBC: 4.18 MIL/uL (ref 3.87–5.11)
RDW: 15.5 % (ref 11.5–15.5)
WBC: 16.5 10*3/uL — ABNORMAL HIGH (ref 4.0–10.5)
nRBC: 0 % (ref 0.0–0.2)
nRBC: 0 /100 WBC

## 2022-06-14 LAB — URINALYSIS, W/ REFLEX TO CULTURE (INFECTION SUSPECTED)
Glucose, UA: NEGATIVE mg/dL
Hgb urine dipstick: NEGATIVE
Ketones, ur: NEGATIVE mg/dL
Leukocytes,Ua: NEGATIVE
Nitrite: NEGATIVE
Protein, ur: 30 mg/dL — AB
Specific Gravity, Urine: 1.025 (ref 1.005–1.030)
pH: 5.5 (ref 5.0–8.0)

## 2022-06-14 LAB — RESP PANEL BY RT-PCR (RSV, FLU A&B, COVID)  RVPGX2
Influenza A by PCR: NEGATIVE
Influenza B by PCR: NEGATIVE
Resp Syncytial Virus by PCR: NEGATIVE
SARS Coronavirus 2 by RT PCR: NEGATIVE

## 2022-06-14 LAB — LACTIC ACID, PLASMA: Lactic Acid, Venous: 1.2 mmol/L (ref 0.5–1.9)

## 2022-06-14 LAB — PROCALCITONIN: Procalcitonin: 0.38 ng/mL

## 2022-06-14 LAB — PROTIME-INR
INR: 1 (ref 0.8–1.2)
Prothrombin Time: 13.4 seconds (ref 11.4–15.2)

## 2022-06-14 LAB — APTT: aPTT: 35 seconds (ref 24–36)

## 2022-06-14 LAB — TROPONIN I (HIGH SENSITIVITY)
Troponin I (High Sensitivity): 40 ng/L — ABNORMAL HIGH (ref <18)
Troponin I (High Sensitivity): 40 ng/L — ABNORMAL HIGH (ref <18)

## 2022-06-14 LAB — STREP PNEUMONIAE URINARY ANTIGEN: Strep Pneumo Urinary Antigen: NEGATIVE

## 2022-06-14 LAB — BRAIN NATRIURETIC PEPTIDE: B Natriuretic Peptide: 87.8 pg/mL (ref 0.0–100.0)

## 2022-06-14 MED ORDER — METRONIDAZOLE 500 MG/100ML IV SOLN
500.0000 mg | Freq: Two times a day (BID) | INTRAVENOUS | Status: DC
  Administered 2022-06-14 – 2022-06-15 (×3): 500 mg via INTRAVENOUS
  Filled 2022-06-14 (×3): qty 100

## 2022-06-14 MED ORDER — SODIUM CHLORIDE 0.9 % IV SOLN
500.0000 mg | INTRAVENOUS | Status: DC
  Administered 2022-06-14 – 2022-06-17 (×4): 500 mg via INTRAVENOUS
  Filled 2022-06-14 (×5): qty 5

## 2022-06-14 MED ORDER — GUAIFENESIN 100 MG/5ML PO LIQD: 5.0000 mL | ORAL | Status: DC | PRN

## 2022-06-14 MED ORDER — IOHEXOL 350 MG/ML SOLN
75.0000 mL | Freq: Once | INTRAVENOUS | Status: AC | PRN
  Administered 2022-06-14: 75 mL via INTRAVENOUS

## 2022-06-14 MED ORDER — ENOXAPARIN SODIUM 40 MG/0.4ML IJ SOSY
40.0000 mg | PREFILLED_SYRINGE | INTRAMUSCULAR | Status: DC
  Administered 2022-06-15 – 2022-06-17 (×3): 40 mg via SUBCUTANEOUS
  Filled 2022-06-14 (×3): qty 0.4

## 2022-06-14 MED ORDER — VANCOMYCIN HCL 1500 MG/300ML IV SOLN
1500.0000 mg | Freq: Once | INTRAVENOUS | Status: AC
  Administered 2022-06-14: 1500 mg via INTRAVENOUS
  Filled 2022-06-14: qty 300

## 2022-06-14 MED ORDER — ALBUTEROL SULFATE (2.5 MG/3ML) 0.083% IN NEBU: 2.5000 mg | INHALATION_SOLUTION | RESPIRATORY_TRACT | Status: DC | PRN

## 2022-06-14 MED ORDER — SODIUM CHLORIDE 0.9 % IV SOLN
2.0000 g | INTRAVENOUS | Status: DC
  Administered 2022-06-15 – 2022-06-17 (×3): 2 g via INTRAVENOUS
  Filled 2022-06-14 (×3): qty 20

## 2022-06-14 MED ORDER — SODIUM CHLORIDE 0.9 % IV BOLUS
1000.0000 mL | Freq: Once | INTRAVENOUS | Status: AC
  Administered 2022-06-14: 1000 mL via INTRAVENOUS

## 2022-06-14 MED ORDER — LACTATED RINGERS IV BOLUS (SEPSIS)
1000.0000 mL | Freq: Once | INTRAVENOUS | Status: AC
  Administered 2022-06-14: 1000 mL via INTRAVENOUS

## 2022-06-14 MED ORDER — VANCOMYCIN HCL IN DEXTROSE 1-5 GM/200ML-% IV SOLN
1000.0000 mg | INTRAVENOUS | Status: DC
  Filled 2022-06-14: qty 200

## 2022-06-14 MED ORDER — SODIUM CHLORIDE 0.9 % IV SOLN
2.0000 g | Freq: Once | INTRAVENOUS | Status: AC
  Administered 2022-06-14: 2 g via INTRAVENOUS
  Filled 2022-06-14: qty 20

## 2022-06-14 NOTE — ED Notes (Signed)
Lupita Dawn (daughter) [phone]. Would like an update

## 2022-06-14 NOTE — Progress Notes (Signed)
   06/14/22 2130  Spiritual Encounters  Type of Visit Initial  Care provided to: Pt and family  Referral source Nurse (RN/NT/LPN)  Reason for visit Routine spiritual support  OnCall Visit Yes   Chaplain Jorene Guest responded to call from the RN. Stating the patient's son is requesting prayer for himself and patient. Upon entering the room, the patient son, Mia [place] was feeding the patient. This chaplain prayed per his request. He expressed appreciation of chaplain's visit, He also recalled the spiritual care provided by Chaplain Ray when the patient's husband was in [facility].  This note was prepared by Jeanine Luz, M.Div..  For questions please contact by phone [phone].

## 2022-06-14 NOTE — Telephone Encounter (Signed)
Patient have not bee seen in over a year please advise message below if agreed on referral palliative care. Or will patient need to come in for evaluation.

## 2022-06-14 NOTE — ED Provider Notes (Signed)
[facility] Provider Note   CSN: [id number] Arrival date & time: 06/14/22  1512     History  Chief Complaint  Patient presents with   Cough    Leslie Benton is a 82 y.o. female.  This is an 82 year old female with history of hypothyroidism, hypertension, type 2 diabetes, and dementia presenting to the ED for cough.  Patient states for 1 week she has had cough with fatigue which has been worsening.  Her cough is productive and she became hypoxic today at home with difficulty breathing.  Patient was evaluated by EMS, started on oxygen and brought to the emergency department.  She endorses cough, no chest pain, no nausea or vomiting.  Of note caregiver says patient has been progressively more confused over the last week or 2, she did have a fall about a week ago and was never evaluated.     Home Medications Prior to Admission medications   Medication Sig Start Date End Date Taking? Authorizing Provider  atorvastatin (LIPITOR) 10 MG tablet Take 10 mg by mouth daily.    [provider]  CRANBERRY PO Take by mouth.    [provider]  ibuprofen (ADVIL,MOTRIN) 200 MG tablet Take 400 mg by mouth as needed for headache or moderate pain.     [provider]  levothyroxine (SYNTHROID) 175 MCG tablet TAKE 1 TABLET(175 MCG) BY MOUTH DAILY BEFORE BREAKFAST 11/30/19   [name], MD  lisinopril (ZESTRIL) 10 MG tablet TAKE 1 TABLET(10 MG) BY MOUTH DAILY 01/03/20   [name], MD  loratadine (CLARITIN) 10 MG tablet Take 10 mg by mouth daily as needed for allergies or rhinitis.     [provider]  mesalamine (CANASA) 1000 MG suppository Place 1 suppository (1,000 mg total) rectally 2 (two) times daily. 01/14/19 02/13/19  Donne Hazel, MD  mesalamine (PENTASA) 250 MG CR capsule Take 4 capsules (1,000 mg total) by mouth 4 (four) times daily. 01/14/19 02/13/19  Donne Hazel, MD  metFORMIN  (GLUCOPHAGE) 1000 MG tablet Take 1 tablet (1,000 mg total) by mouth 2 (two) times daily with a meal. 03/05/19   [name], MD  metoprolol tartrate (LOPRESSOR) 25 MG tablet Take 1 tablet (25 mg total) by mouth 4 (four) times daily. 01/14/19 02/13/19  Donne Hazel, MD  Multiple Vitamin (MULTIVITAMIN WITH MINERALS) TABS tablet Take 1 tablet by mouth daily.    [provider]  QUEtiapine (SEROQUEL) 25 MG tablet TAKE 1 TO 2 TABLETS BY MOUTH AT NIGHT AS NEEDED 05/16/20   Dutch Quint B, FNP      Allergies    Niacin and related, Lactose intolerance (gi), Amoxicillin, Penicillins, and Tilactase    Review of Systems   Review of Systems  Constitutional:  Positive for fever. Negative for chills.  Respiratory:  Positive for cough and shortness of breath.   Cardiovascular:  Negative for chest pain.  Gastrointestinal:  Negative for abdominal pain.  Neurological:  Negative for syncope.  Psychiatric/Behavioral:  Positive for confusion.     Physical Exam Updated Vital Signs BP (!) 142/90   Pulse (!) 104   Temp 98.4 F (36.9 C) (Oral)   Resp 18   SpO2 95%  Physical Exam Vitals and nursing note reviewed.  Constitutional:      General: She is not in acute distress. HENT:     Head: Normocephalic.     Nose: Nose normal.     Mouth/Throat:  Mouth: Mucous membranes are moist.  Eyes:     Extraocular Movements: Extraocular movements intact.     Pupils: Pupils are equal, round, and reactive to light.  Cardiovascular:     Rate and Rhythm: Regular rhythm. Tachycardia present.     Pulses: Normal pulses.     Heart sounds: Normal heart sounds. No murmur heard.    No friction rub. No gallop.  Pulmonary:     Breath sounds: Rhonchi present.     Comments: Tachypnea, diffuse rhonchi, coarse breath sounds in the lung bases Abdominal:     General: There is no distension.     Palpations: Abdomen is soft.     Tenderness: There is no abdominal tenderness. There is no guarding.   Skin:    General: Skin is warm.     Capillary Refill: Capillary refill takes less than 2 seconds.  Neurological:     General: No focal deficit present.     Mental Status: She is alert and oriented to person, place, and time.     ED Results / Procedures / Treatments   Labs (all labs ordered are listed, but only abnormal results are displayed) Labs Reviewed  RESP PANEL BY RT-PCR (RSV, FLU A&B, COVID)  [id number]  CBC WITH DIFFERENTIAL/PLATELET  BASIC METABOLIC PANEL    EKG None  Radiology No results found.  Procedures Procedures    Medications Ordered in ED Medications - No data to display  ED Course/ Medical Decision Making/ A&P                             Medical Decision Making Patient presents with cough, worsening confusion and difficulty breathing.  She is febrile to 100.5, tachycardic and tachypneic, I am concerned for community-acquired pneumonia versus aspiration pneumonia.  Sepsis workup initiated, patient empirically covered with Rocephin and azithromycin.  Given her fall with worsening confusion I will also obtain a CT head for evaluation.  EKG and chest x-ray ordered.  I personally reviewed and interpreted patient's labs which is significant for hyponatremia 131, hypochloremia 95, elevated creatinine of 1.09 from 0.84, lactic acid normal, leukocytosis of 16.5, INR normal.  I personally reviewed and interpreted patient's imaging and agree with radiology, chest x-ray shows lobar density in the right mid lung which is likely pneumonia.  I ordered a CT scan for further evaluation which shows 3 rounded masslike opacities which are worrisome for multifocal pneumonia which could include cavitary pneumonias or early abscess.  CT head shows no intracranial abnormality.  With patient's concern for multifocal pneumonia and abscess, vancomycin and Flagyl added on for empiric coverage.    I personally reviewed and interpreted patient's EKG which is significant for sinus  tachycardia with rate 104, similar to prior, no acute ischemic changes, T waves slightly taller.  I called discussed this case with the hospitalist team for admission as patient has multifocal pneumonia with an oxygen requirement.  They agreed to admit patient to her service for further management workup.  Patient stable upon admission to hospital.  Problems Addressed: Multifocal pneumonia: acute illness or injury that poses a threat to life or bodily functions  Amount and/or Complexity of Data Reviewed Independent Historian: caregiver Labs: ordered. Decision-making details documented in ED Course. Radiology: ordered and independent interpretation performed. Decision-making details documented in ED Course. ECG/medicine tests: ordered and independent interpretation performed. Decision-making details documented in ED Course.  Risk Prescription drug management. Decision regarding hospitalization.  Final Clinical Impression(s) / ED Diagnoses Final diagnoses:  None    Rx / DC Orders ED Discharge Orders     None         Jimmie Molly, MD 06/14/22 [id number]    Gareth Morgan, MD 06/15/22 [phone]

## 2022-06-14 NOTE — Progress Notes (Signed)
Pharmacy Antibiotic Note  Leslie Benton is a 82 y.o. female admitted on 06/14/2022 presenting with cough and SOB, CT chest worrisome for cavitary pna or possible abscess/necrosis.  Pharmacy has been consulted for vancomycin dosing.  Ceftriaxone/flagyl per MD  Plan: Vancomycin 1500 mg IV x 1, then 1g IV q 36h (eAUC 533) Monitor renal function, Cx and clinical progression to narrow Vancomycin levels as indicated      Temp (24hrs), Avg:99.2 F (37.3 C), Min:98.4 F (36.9 C), Max:100.5 F (38.1 C)  Recent Labs  Lab 06/14/22 1618 06/14/22 1624  WBC  --  16.5*  CREATININE  --  1.09*  LATICACIDVEN 1.2  --     CrCl cannot be calculated (Unknown ideal weight.).    Allergies  Allergen Reactions   Niacin And Related Hives and Swelling    No anaphalaxis, but strong reactions.   Lactose Intolerance (Gi)     "bad taste in mouth"   Amoxicillin Swelling    hives   Penicillins Swelling    Hives With all cillins   Tilactase     Bertis Ruddy, PharmD, [place] Pharmacist ED Pharmacist Phone # [phone] 06/14/2022 9:25 PM

## 2022-06-14 NOTE — Progress Notes (Signed)
Elink following code sepsis °

## 2022-06-14 NOTE — ED Notes (Signed)
The patient's daughter called and reports the patient has been more confused and disoriented over the last week. She reports she has not been able to get out of the bed and has been more incontinent. She is usually able to ambulate with her walker. She reports she believes the patient may have had a fall last week, unsure if she hit her head.

## 2022-06-14 NOTE — ED Notes (Signed)
Patient transported to CT 

## 2022-06-14 NOTE — H&P (Incomplete)
PCP:   [name], MD   Chief Complaint:  Confusion  HPI: This is a 82 year old female with past medical history of anxiety and depression, diabetes, hyperlipidemia, hypertension and hypothyroidism.  She is brought in due to confusion, difficulty breathing, cough and fatigue.  Patient confused and unable to provide any history, she is only able to say she is did not feel well for a long time over 2 weeks, no fevers.  Unsuccessful attempts made to contact daughter/granddaughter.  In the ER blood cultures collected.  CT chest shows 3 rounded masslike airspace opacities seen bilaterally. Two areas are hypodense centrally with 1 area containing central air bubbles. Findings are worrisome for multifocal pneumonia including cavitary pneumonias or possible early abscesses. Neoplasm with necrosis can not be excluded.  Review of Systems:  Unable to obtain due to acuity of illness Per report: Confusion, difficulty breathing, cough and fatigue  Past Medical History: Past Medical History:  Diagnosis Date   Anemia    Anxiety    Cervical spondylosis without myelopathy 05/29/2015   COLONIC POLYPS, HX OF 02/07/2008   DEGENERATIVE JOINT DISEASE 10/14/2006   03/31/11: Pt states arthritis in her hip.   Depression    DIABETES MELLITUS, TYPE II 10/27/2009   GERD 10/14/2006   HYPERLIPIDEMIA 10/14/2006   HYPERTENSION 10/14/2006   off bp meds now   HYPOTHYROIDISM 10/14/2006   LOW BACK PAIN 08/04/2007   Obesity    Past Surgical History:  Procedure Laterality Date   BIOPSY  01/12/2019   Procedure: BIOPSY;  Surgeon: Otis Brace, MD;  Location: [facility];  Service: Gastroenterology;;   Otho Darner SIGMOIDOSCOPY N/A 01/12/2019   Procedure: FLEXIBLE SIGMOIDOSCOPY;  Surgeon: Otis Brace, MD;  Location: [facility];  Service: Gastroenterology;  Laterality: N/A;   HEMORRHOID SURGERY     Left knee arthroscopic  April 2014   Dr. Durward Fortes   PARTIAL KNEE ARTHROPLASTY Left 09/23/2014   Procedure:  LEFT UNICOMPARTMENTAL KNEE;  Surgeon: [name], MD;  Location: [facility];  Service: Orthopedics;  Laterality: Left;   TONSILLECTOMY     TUBAL LIGATION      Medications: Prior to Admission medications   Medication Sig Start Date End Date Taking? Authorizing Provider  atorvastatin (LIPITOR) 10 MG tablet Take 10 mg by mouth daily.    [provider]  CRANBERRY PO Take by mouth.    [provider]  ibuprofen (ADVIL,MOTRIN) 200 MG tablet Take 400 mg by mouth as needed for headache or moderate pain.     [provider]  levothyroxine (SYNTHROID) 175 MCG tablet TAKE 1 TABLET(175 MCG) BY MOUTH DAILY BEFORE BREAKFAST 11/30/19   [name], MD  lisinopril (ZESTRIL) 10 MG tablet TAKE 1 TABLET(10 MG) BY MOUTH DAILY 01/03/20   [name], MD  loratadine (CLARITIN) 10 MG tablet Take 10 mg by mouth daily as needed for allergies or rhinitis.     [provider]  mesalamine (CANASA) 1000 MG suppository Place 1 suppository (1,000 mg total) rectally 2 (two) times daily. 01/14/19 02/13/19  Donne Hazel, MD  mesalamine (PENTASA) 250 MG CR capsule Take 4 capsules (1,000 mg total) by mouth 4 (four) times daily. 01/14/19 02/13/19  Donne Hazel, MD  metFORMIN (GLUCOPHAGE) 1000 MG tablet Take 1 tablet (1,000 mg total) by mouth 2 (two) times daily with a meal. 03/05/19   [name], MD  metoprolol tartrate (LOPRESSOR) 25 MG tablet Take 1 tablet (25 mg total) by mouth 4 (four) times daily. 01/14/19 02/13/19  Wyline Copas,  Orpah Melter, MD  Multiple Vitamin (MULTIVITAMIN WITH MINERALS) TABS tablet Take 1 tablet by mouth daily.    [provider]  QUEtiapine (SEROQUEL) 25 MG tablet TAKE 1 TO 2 TABLETS BY MOUTH AT NIGHT AS NEEDED 05/16/20   Kennyth Arnold, FNP    Allergies:   Allergies  Allergen Reactions   Niacin And Related Hives and Swelling    No anaphalaxis, but strong reactions.   Lactose Intolerance (Gi)     "bad taste in mouth"    Amoxicillin Swelling    hives   Penicillins Swelling    Hives With all cillins   Tilactase     Social History:  reports that she has never smoked. She has never used smokeless tobacco. She reports that she does not drink alcohol and does not use drugs.  Family History: Family History  Problem Relation Age of Onset   Heart attack Father 7   Diabetes Father    Kidney failure Mother    Cancer Sister        endometrial   Cancer Brother        Adrenal    Physical Exam: Vitals:   06/14/22 1944 06/14/22 1945 06/14/22 2000 06/14/22 2200  BP:  109/65 126/68 127/62  Pulse:  100 (!) 102 (!) 103  Resp:  18 (!) 22 (!) 27  Temp: 98.7 F (37.1 C)     TempSrc: Oral     SpO2:  96% 94% 99%  Tmax 100.5  General:  Alert, confused female, well developed, ill appearing female Eyes:pink conjunctiva, no scleral icterus ENT: Dry oral mucosa, neck supple, no thyromegaly Lungs: Diffuse rhonchi posteriorly, no use of accessory muscles Cardiovascular: Tachycardic, regular rate and rhythm, no regurgitation, no gallops, no murmurs. No carotid bruits, no JVD Abdomen: soft, positive BS, non-tender, non-distended, no organomegaly, not an acute abdomen GU: not examined Neuro: CN II - XII appears grossly intact Musculoskeletal: strength appears to be equal in all extremities, no cyanosis or edema Skin: no rash, no subcutaneous crepitation, no decubitus Psych: Confused patient   Labs on Admission:  Recent Labs    06/14/22 1624  NA 131*  K 4.3  CL 95*  CO2 24  GLUCOSE 228*  BUN 18  CREATININE 1.09*  CALCIUM 8.4*   Recent Labs    06/14/22 1624  WBC 16.5*  NEUTROABS 15.7*  HGB 12.1  HCT 36.5  MCV 87.3  PLT 348    Micro Results: Recent Results (from the past 240 hour(s))  Resp panel by RT-PCR (RSV, Flu A&B, Covid) Anterior Nasal Swab     Status: None   Collection Time: 06/14/22  3:22 PM   Specimen: Anterior Nasal Swab  Result Value Ref Range Status   SARS Coronavirus 2 by RT PCR  NEGATIVE NEGATIVE Final   Influenza A by PCR NEGATIVE NEGATIVE Final   Influenza B by PCR NEGATIVE NEGATIVE Final    Comment: (NOTE) The Xpert Xpress SARS-CoV-2/FLU/RSV plus assay is intended as an aid in the diagnosis of influenza from Nasopharyngeal swab specimens and should not be used as a sole basis for treatment. Nasal washings and aspirates are unacceptable for Xpert Xpress SARS-CoV-2/FLU/RSV testing.  Fact Sheet for Patients: EntrepreneurPulse.com.au  Fact Sheet for Healthcare Providers: IncredibleEmployment.be  This test is not yet approved or cleared by the [place] FDA and has been authorized for detection and/or diagnosis of SARS-CoV-2 by FDA under an Emergency Use Authorization (EUA). This EUA will remain in effect (meaning this test can be used)  for the duration of the COVID-19 declaration under Section 564(b)(1) of the Act, 21 U.S.C. section 360bbb-3(b)(1), unless the authorization is terminated or revoked.     Resp Syncytial Virus by PCR NEGATIVE NEGATIVE Final    Comment: (NOTE) Fact Sheet for Patients: EntrepreneurPulse.com.au  Fact Sheet for Healthcare Providers: IncredibleEmployment.be  This test is not yet approved or cleared by the [place] FDA and has been authorized for detection and/or diagnosis of SARS-CoV-2 by FDA under an Emergency Use Authorization (EUA). This EUA will remain in effect (meaning this test can be used) for the duration of the COVID-19 declaration under Section 564(b)(1) of the Act, 21 U.S.C. section 360bbb-3(b)(1), unless the authorization is terminated or revoked.  Performed at [facility], [place] [address] [place],  [address]      Radiological Exams on Admission: CT Head Wo Contrast  Result Date: 06/14/2022 CLINICAL DATA:  Altered mental status. EXAM: CT HEAD WITHOUT CONTRAST TECHNIQUE: Contiguous axial images were obtained from  the base of the skull through the vertex without intravenous contrast. RADIATION DOSE REDUCTION: This exam was performed according to the departmental dose-optimization program which includes automated exposure control, adjustment of the mA and/or kV according to patient size and/or use of iterative reconstruction technique. COMPARISON:  February 09, 2019 FINDINGS: Brain: There is mild cerebral atrophy with widening of the extra-axial spaces and ventricular dilatation. There are areas of decreased attenuation within the white matter tracts of the supratentorial brain, consistent with microvascular disease changes. 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 Vascular: No hyperdense vessel or unexpected calcification. Skull: Normal. Negative for fracture or focal lesion. Sinuses/Orbits: No acute finding. Other: None. IMPRESSION: 1. No acute intracranial abnormality. 2. Generalized cerebral atrophy and microvascular disease changes of the supratentorial brain. 3. 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 Electronically Signed   By: Virgina Norfolk M.D.   On: 06/14/2022 20:52   CT Angio Chest PE W and/or Wo Contrast  Result Date: 06/14/2022 CLINICAL DATA:  High probability for PE.  Cough. EXAM: CT ANGIOGRAPHY CHEST WITH CONTRAST TECHNIQUE: Multidetector CT imaging of the chest was performed using the standard protocol during bolus administration of intravenous contrast. Multiplanar CT image reconstructions and MIPs were obtained to evaluate the vascular anatomy. RADIATION DOSE REDUCTION: This exam was performed according to the departmental dose-optimization program which includes automated exposure control, adjustment of the mA and/or kV according to patient size and/or use of iterative reconstruction technique. CONTRAST:  29m OMNIPAQUE IOHEXOL 350 MG/ML SOLN COMPARISON:  CT angiogram chest 01/06/2019 FINDINGS: Cardiovascular: Satisfactory opacification of the pulmonary arteries  to the segmental level. No evidence of pulmonary embolism. Normal heart size. No pericardial effusion. There are atherosclerotic calcifications of the aorta. Mediastinum/Nodes: There is diffuse esophageal wall thickening. There is a small hiatal hernia. No enlarged lymph nodes are seen. The visualized esophagus is within normal limits. Lungs/Pleura: There is a focal rounded airspace opacity this is which is masslike in configuration abutting the pleura in the left upper lobe measuring 4.4 x 2.8 by 4.0 cm. There is a second rounded masslike density in the right lower lobe abutting the pleura measuring 3.9 by 3.8 by 3.7 cm image 6/95. This area has a mildly hypodense region centrally. Third masslike area is seen in the superior segment of the right lower lobe abutting the pleura measuring 6.4 x 3.4 5.7 cm. This is hypodense centrally with a few small bubbles of air seen in the nondependent portion. There is ground-glass opacity and smooth  interlobular septal thickening surrounding these 3 regions. There are small pleural effusions, right greater than left with compressive atelectasis in the bilateral lower lobes. No pneumothorax visualized. Upper Abdomen: No acute abnormality. Musculoskeletal: Degenerative changes affect the spine. No acute fracture or focal osseous lesion. Review of the MIP images confirms the above findings. IMPRESSION: 1. No evidence for pulmonary embolism. 2. There are 3 rounded masslike airspace opacities seen bilaterally. Two areas are hypodense centrally with 1 area containing central air bubbles. Findings are worrisome for multifocal pneumonia including cavitary pneumonias or possible early abscesses. Neoplasm with necrosis can not be excluded. 3. Small bilateral pleural effusions with compressive atelectasis in the lower lobes. 4. Diffuse esophageal wall thickening worrisome for esophagitis. Aortic Atherosclerosis (ICD10-I70.0). Electronically Signed   By: Ronney Asters M.D.   On: 06/14/2022  19:30   DG Chest 2 View  Result Date: 06/14/2022 CLINICAL DATA:  Cough. EXAM: CHEST - 2 VIEW COMPARISON:  September 29, 2013. FINDINGS: Stable cardiomediastinal silhouette. Small bilateral pleural effusions are noted. Lobular density is noted laterally in right midlung which may represent pneumonia, but neoplasm cannot be excluded. Bony thorax is unremarkable. IMPRESSION: Lobular density seen laterally in right mid lung which may represent pneumonia, but CT scan cannot be excluded. CT scan of the chest is recommended for further evaluation. Small bilateral pleural effusions. Electronically Signed   By: Marijo Conception M.D.   On: 06/14/2022 15:50    Assessment/Plan Present on Admission:  Multifocal pneumonia, concern for abscess or malignancy -Pneumonia order set initiated -Blood and sputum cultures, and procalcitonin collected -IV Rocephin 2 g, Flagyl and vancomycin ordered -Oxygen to keep sats greater than 88% -Respiratory consult placed -Follow-up CT with treatment, to evaluate for abscess vs malignancy -Lactic acid normal  Acute metabolic encephalopathy -d/t above.  Treating because  Diabetes mellitus, uncontrolled -Sliding scale insulin initiated -Levemir initiated  Ulcerative colitis/immunocompromise due to medication -Mesalamine on hold   Hypothyroidism -Synthroid resumed, TSH ordered   Chronic fatigue  Leslie Benton 06/14/2022, 10:19 PM

## 2022-06-14 NOTE — ED Triage Notes (Signed)
Pt BIB [facility] from home c/o cough, per caregiver pt has had cough x 1 week. , apparently 1  hour  ago pt has an episode when she was "gasping for air" short episode and has resolved.  Last VS 150/102, hx HTN {(Not take meds today) p 82, rr 18, 90%RA, Placed on 2L-93%.  HX Dementia- per caregiver orientation at baseline.  No medications given by EMS.

## 2022-06-15 ENCOUNTER — Encounter: Payer: Self-pay | Admitting: Hematology & Oncology

## 2022-06-15 DIAGNOSIS — J189 Pneumonia, unspecified organism: Secondary | ICD-10-CM | POA: Diagnosis not present

## 2022-06-15 LAB — CBC WITH DIFFERENTIAL/PLATELET
Abs Immature Granulocytes: 0 10*3/uL (ref 0.00–0.07)
Basophils Absolute: 0 10*3/uL (ref 0.0–0.1)
Basophils Relative: 0 %
Eosinophils Absolute: 0 10*3/uL (ref 0.0–0.5)
Eosinophils Relative: 0 %
HCT: 33.4 % — ABNORMAL LOW (ref 36.0–46.0)
Hemoglobin: 10.5 g/dL — ABNORMAL LOW (ref 12.0–15.0)
Lymphocytes Relative: 11 %
Lymphs Abs: 1.9 10*3/uL (ref 0.7–4.0)
MCH: 28 pg (ref 26.0–34.0)
MCHC: 31.4 g/dL (ref 30.0–36.0)
MCV: 89.1 fL (ref 80.0–100.0)
Monocytes Absolute: 0.7 10*3/uL (ref 0.1–1.0)
Monocytes Relative: 4 %
Neutro Abs: 14.6 10*3/uL — ABNORMAL HIGH (ref 1.7–7.7)
Neutrophils Relative %: 85 %
Platelets: 317 10*3/uL (ref 150–400)
RBC: 3.75 MIL/uL — ABNORMAL LOW (ref 3.87–5.11)
RDW: 15.6 % — ABNORMAL HIGH (ref 11.5–15.5)
WBC: 17.2 10*3/uL — ABNORMAL HIGH (ref 4.0–10.5)
nRBC: 0 % (ref 0.0–0.2)
nRBC: 0 /100 WBC

## 2022-06-15 LAB — CBG MONITORING, ED
Glucose-Capillary: 214 mg/dL — ABNORMAL HIGH (ref 70–99)
Glucose-Capillary: 178 mg/dL — ABNORMAL HIGH (ref 70–99)
Glucose-Capillary: 208 mg/dL — ABNORMAL HIGH (ref 70–99)
Glucose-Capillary: 148 mg/dL — ABNORMAL HIGH (ref 70–99)
Glucose-Capillary: 227 mg/dL — ABNORMAL HIGH (ref 70–99)

## 2022-06-15 LAB — BASIC METABOLIC PANEL
Anion gap: 11 (ref 5–15)
BUN: 19 mg/dL (ref 8–23)
CO2: 22 mmol/L (ref 22–32)
Calcium: 7.9 mg/dL — ABNORMAL LOW (ref 8.9–10.3)
Chloride: 98 mmol/L (ref 98–111)
Creatinine, Ser: 1.04 mg/dL — ABNORMAL HIGH (ref 0.44–1.00)
GFR, Estimated: 53 mL/min — ABNORMAL LOW (ref >60)
Glucose, Bld: 228 mg/dL — ABNORMAL HIGH (ref 70–99)
Potassium: 4.1 mmol/L (ref 3.5–5.1)
Sodium: 131 mmol/L — ABNORMAL LOW (ref 135–145)

## 2022-06-15 LAB — MAGNESIUM: Magnesium: 1.7 mg/dL (ref 1.7–2.4)

## 2022-06-15 LAB — MRSA NEXT GEN BY PCR, NASAL
MRSA by PCR Next Gen: NOT DETECTED
MRSA by PCR Next Gen: NOT DETECTED

## 2022-06-15 LAB — TSH: TSH: 38.398 u[IU]/mL — ABNORMAL HIGH (ref 0.350–4.500)

## 2022-06-15 LAB — T4, FREE: Free T4: <0.25 ng/dL — ABNORMAL LOW (ref 0.61–1.12)

## 2022-06-15 LAB — LACTIC ACID, PLASMA: Lactic Acid, Venous: 1.2 mmol/L (ref 0.5–1.9)

## 2022-06-15 LAB — GLUCOSE, CAPILLARY: Glucose-Capillary: 101 mg/dL — ABNORMAL HIGH (ref 70–99)

## 2022-06-15 MED ORDER — INSULIN ASPART 100 UNIT/ML IJ SOLN
0.0000 [IU] | Freq: Three times a day (TID) | INTRAMUSCULAR | Status: DC
  Administered 2022-06-15: 2 [IU] via SUBCUTANEOUS
  Administered 2022-06-15 – 2022-06-16 (×3): 5 [IU] via SUBCUTANEOUS
  Administered 2022-06-16: 8 [IU] via SUBCUTANEOUS
  Administered 2022-06-16: 2 [IU] via SUBCUTANEOUS
  Administered 2022-06-17: 3 [IU] via SUBCUTANEOUS
  Administered 2022-06-17: 2 [IU] via SUBCUTANEOUS
  Administered 2022-06-17: 5 [IU] via SUBCUTANEOUS

## 2022-06-15 MED ORDER — INSULIN DETEMIR 100 UNIT/ML ~~LOC~~ SOLN
5.0000 [IU] | Freq: Every day | SUBCUTANEOUS | Status: DC
  Administered 2022-06-15 – 2022-06-17 (×3): 5 [IU] via SUBCUTANEOUS
  Filled 2022-06-15 (×3): qty 0.05

## 2022-06-15 MED ORDER — SODIUM CHLORIDE 0.9 % IV SOLN: INTRAVENOUS | Status: DC

## 2022-06-15 MED ORDER — LEVOTHYROXINE SODIUM 75 MCG PO TABS
175.0000 ug | ORAL_TABLET | Freq: Every day | ORAL | Status: DC
  Administered 2022-06-15 – 2022-06-17 (×3): 175 ug via ORAL
  Filled 2022-06-15 (×3): qty 1

## 2022-06-15 MED ORDER — INSULIN ASPART 100 UNIT/ML IJ SOLN
0.0000 [IU] | Freq: Every day | INTRAMUSCULAR | Status: DC
  Administered 2022-06-15: 2 [IU] via SUBCUTANEOUS

## 2022-06-15 NOTE — ED Notes (Signed)
Leslie Benton (daughter) called asking for an update in [place]. Her number is [phone].

## 2022-06-15 NOTE — Telephone Encounter (Signed)
Returned [name]'s call regarding palliative care for patient, no answer LM informing [name] that patient have not been seen in the office in over 1 year per Dr. Ethelene Hal patient needs to come in for over due appointment. Called to schedule appointment per patients son patient currently in the hospital but he will have patient call to schedule an appointment.

## 2022-06-15 NOTE — Progress Notes (Signed)
PROGRESS NOTE  Leslie Benton  DOB: November 04, 1940  PCP: [name], MD HS:[id number]  DOA: 06/14/2022  LOS: 2 days  Hospital Day: 3  Brief narrative: Leslie Benton is a 82 y.o. female with PMH significant for DM2, HTN, HLD, hypothyroidism, anxiety/depression, chronic anemia, degenerative joint disease. 2/26, patient was brought to the ED from home with complaint of cough for 1-2 weeks and an episode of gasping for air.  In the ED, temperature 100.5, heart rate 100s, blood pressure 142/90, required 2 to 3 L oxygen by nasal cannula. Labs with WC count elevated to 16.5, lactic acid level normal, sodium 131, glucose 228, troponin elevated to 40 Respiratorye virus panel negative for influenza, COVID CT angio of chest was negative for pulm embolism.  But it showed 3 rounded masslike opacities bilaterally. Two areas are hypodense centrally with 1 area containing central air bubbles. Findings are worrisome for multifocal pneumonia including cavitary pneumonias or possible early abscesses. Neoplasm with necrosis can not be excluded.  Blood culture sent. Patient was started on IV Rocephin, vancomycin, azithromycin and Flagyl.  In the ER blood cultures collected.  CT chest shows 3 rounded masslike airspace opacities seen bilaterally. Two areas are hypodense centrally with 1 area containing central air bubbles. Findings are worrisome for multifocal pneumonia including cavitary pneumonias or possible early abscesses. Neoplasm with necrosis can not be excluded.  Subjective: Patient was seen and examined this morning.  Pleasant elderly Caucasian female.  Propped up in bed.  Wakes up to name command.  States she is very tired.  Looks lethargic. Labs this morning with WBC count 17.2, sodium 131, creatinine 1.04, blood glucose 228.  Assessment and plan: Sepsis - POA multifocal pneumonia, concern for abscess or malignancy Presented with cough, fatigue for 1 to 2 weeks and an episode of shortness  of breath Met sepsis criteria with tachycardia, tachypnea, fever, leukocytosis and source of infection Culture sent.  Currently on broad-spectrum IV antibiotics Lactate level normal.  Hemodynamically stable. CT chest is also concerning for cavitary pneumonia or possible early abscess.  Will obtain pulmonary consultation for possible need of bronchoscopy. Continue to monitor. Recent Labs  Lab 06/14/22 1618 06/14/22 1624 06/14/22 1940 06/15/22 0133  WBC  --  16.5*  --  17.2*  LATICACIDVEN 1.2  --   --  1.2  PROCALCITON  --   --  0.38  --    Acute metabolic encephalopathy Altered mental status at presentation likely because of pneumonia.  May have underlying dementia as well given advanced age and vascular risk factors. Continue to monitor mental status change. PT, patient seems to be on Seroquel 25 to 50 mg nightly as needed  Type 2 diabetes mellitus uncontrolled with hyperglycemia A1c significantly elevated in 2022.  Repeat A1c PTA on metformin 1000 mg twice daily Currently on Levemir 5 units daily and sliding scale insulin with Accu-Cheks.  Continue to monitor. Recent Labs  Lab 06/15/22 0854 06/15/22 1240 06/15/22 1709 06/15/22 2121 06/16/22 0618  GLUCAP 208* 148* 227* 101* 143*   Essential hypertension PTA on lisinopril 10 mg daily. Keep lisinopril on hold.  Ulcerative colitis immunocompromised status d/t medication Mesalamine ??? on hold   Hypothyroidism Continue Synthroid  Family states that patient has been taking her Synthroid but her TSH level seems significantly elevated at 38 compared to a normal baseline a year ago. Obtain free T4 Recent Labs    06/15/22 0133  TSH 38.398*   Esophagitis CT scan showed diffuse esophageal wall thickening worrisome for  esophagitis.  HLD Statin   Mobility: Encourage ambulation.  PT eval  Goals of care   Code Status: DNR  I called and discussed with patient's son and his spouse this afternoon.  They stated that she has  a DNR.  I updated the order in epic   DVT prophylaxis:  enoxaparin (LOVENOX) injection 40 mg Start: 06/15/22 0800   Antimicrobials: Broad-spectrum antibiotics Fluid: NS at 72 mill per hour Consultants: pulm Family Communication: Called and updated patient's son and spouse  Status is: Inpatient Level of care: Progressive   Dispo: Patient is from: Home              Anticipated d/c is to: Pending clinical course Continue in-hospital care because: On IV antibiotics.     Scheduled Meds:  enoxaparin (LOVENOX) injection  40 mg Subcutaneous Q24H   insulin aspart  0-15 Units Subcutaneous TID WC   insulin aspart  0-5 Units Subcutaneous QHS   insulin detemir  5 Units Subcutaneous Daily   levothyroxine  175 mcg Oral Q0600    PRN meds: albuterol, guaiFENesin   Infusions:   sodium chloride 75 mL/hr at 06/16/22 0700   azithromycin Stopped (06/15/22 1646)   cefTRIAXone (ROCEPHIN)  IV Stopped (06/15/22 1925)   metronidazole Stopped (06/15/22 2248)   vancomycin      Diet:  Diet Order             Diet heart healthy/carb modified Room service appropriate? Yes; Fluid consistency: Thin  Diet effective now                   Antimicrobials: Anti-infectives (From admission, onward)    Start     Dose/Rate Route Frequency Ordered Stop   06/16/22 1000  vancomycin (VANCOCIN) IVPB 1000 mg/200 mL premix        1,000 mg 200 mL/hr over 60 Minutes Intravenous Every 36 hours 06/14/22 2132     06/15/22 1900  cefTRIAXone (ROCEPHIN) 2 g in sodium chloride 0.9 % 100 mL IVPB        2 g 200 mL/hr over 30 Minutes Intravenous Every 24 hours 06/14/22 2218     06/14/22 2145  vancomycin (VANCOREADY) IVPB 1500 mg/300 mL        1,500 mg 150 mL/hr over 120 Minutes Intravenous  Once 06/14/22 2132 06/14/22 2352   06/14/22 2130  metroNIDAZOLE (FLAGYL) IVPB 500 mg        500 mg 100 mL/hr over 60 Minutes Intravenous Every 12 hours 06/14/22 2122     06/14/22 1630  azithromycin (ZITHROMAX) 500 mg in  sodium chloride 0.9 % 250 mL IVPB        500 mg 250 mL/hr over 60 Minutes Intravenous Every 24 hours 06/14/22 1627     06/14/22 1630  cefTRIAXone (ROCEPHIN) 2 g in sodium chloride 0.9 % 100 mL IVPB        2 g 200 mL/hr over 30 Minutes Intravenous  Once 06/14/22 1628 06/14/22 1747       Skin assessment:  Pressure Injury 06/15/22 Sacrum Mid Stage 1 -  Intact skin with non-blanchable redness of a localized area usually over a bony prominence. pink not blanching spot on sacrum (Active)  06/15/22 1820  Location: Sacrum  Location Orientation: Mid  Staging: Stage 1 -  Intact skin with non-blanchable redness of a localized area usually over a bony prominence.  Wound Description (Comments): pink not blanching spot on sacrum  Present on Admission: Yes      Nutritional status:  There is no height or weight on file to calculate BMI.          Objective: Vitals:   06/16/22 0800 06/16/22 0803  BP:    Pulse: 100 (!) 102  Resp: (!) 23 20  Temp:    SpO2: 90% 93%    Intake/Output Summary (Last 24 hours) at 06/16/2022 [id number] Last data filed at 06/16/2022 0700 Gross per 24 hour  Intake 1593.53 ml  Output 700 ml  Net 893.53 ml   There were no vitals filed for this visit. Weight change:  There is no height or weight on file to calculate BMI.   Physical Exam: General exam: Pleasant, elderly female.  Propped up in bed.  Feels tired. Skin: No rashes, lesions or ulcers. HEENT: Atraumatic, normocephalic, no obvious bleeding Lungs: Clear to auscultation bilaterally CVS: Regular rate and rhythm, no murmur GI/Abd soft, nontender, nondistended, bowel sound present CNS: Alert, awake, oriented to place Psychiatry: Sad affect Extremities: No pedal edema, no calf tenderness.   Data Review: I have personally reviewed the laboratory data and studies available.  F/u labs ordered Unresulted Labs (From admission, onward)     Start     Ordered   06/21/22 0500  Creatinine, serum  (enoxaparin  (LOVENOX)    CrCl >/= 30 ml/min)  Weekly,   R     Comments: while on enoxaparin therapy    06/14/22 2214   06/17/22 0500  CBC with Differential/Platelet  Daily,   R     Question:  Specimen collection method  Answer:  Lab=Lab collect   06/16/22 0807   06/17/22 [id number]  Basic metabolic panel  Daily,   R     Question:  Specimen collection method  Answer:  Lab=Lab collect   06/16/22 0807   06/16/22 [id number]  Basic metabolic panel  ONCE - STAT,   STAT       Question:  Specimen collection method  Answer:  Lab=Lab collect   06/16/22 0807   06/16/22 0808  CBC  ONCE - STAT,   STAT       Question:  Specimen collection method  Answer:  Lab=Lab collect   06/16/22 0807   06/14/22 2215  Legionella Pneumophila Serogp 1 Ur Ag  Once,   AD        06/14/22 2214            Total time spent in review of labs and imaging, patient evaluation, formulation of plan, documentation and communication with family: 42 minutes  Signed, Terrilee Croak, MD [facility] 06/16/2022

## 2022-06-15 NOTE — ED Notes (Signed)
ED TO INPATIENT HANDOFF REPORT  ED Nurse Name and Phone #: [phone]  S Name/Age/Gender Leslie Benton 82 y.o. female Room/Bed: 002C/002C  Code Status   Code Status: DNR  Home/SNF/Other Home Patient oriented to: self, place, time, and situation Is this baseline? Yes   Triage Complete: Triage complete  Chief Complaint Multifocal pneumonia [J18.9]  Triage Note Pt BIB [facility] from home c/o cough, per caregiver pt has had cough x 1 week. , apparently 1  hour  ago pt has an episode when she was "gasping for air" short episode and has resolved.  Last VS 150/102, hx HTN {(Not take meds today) p 82, rr 18, 90%RA, Placed on 2L-93%.  HX Dementia- per caregiver orientation at baseline.  No medications given by EMS.    Allergies Allergies  Allergen Reactions   Niacin And Related Hives and Swelling    No anaphalaxis, but strong reactions.   Lactose Intolerance (Gi)     "bad taste in mouth"   Amoxicillin Swelling    hives   Penicillins Swelling    Hives With all cillins   Tilactase     Level of Care/Admitting Diagnosis ED Disposition     ED Disposition  Admit   Condition  --   Comment  Hospital Area: [facility] [[id number]]  Level of Care: Progressive [102]  Admit to Progressive based on following criteria: RESPIRATORY PROBLEMS hypoxemic/hypercapnic respiratory failure that is responsive to NIPPV (BiPAP) or High Flow Nasal Cannula (6-80 lpm). Frequent assessment/intervention, no > Q2 hrs < Q4 hrs, to maintain oxygenation and pulmonary hygiene.  May [facility] to [facility] if equivalent level of care is available:: Yes  Covid Evaluation: Confirmed COVID Negative  Diagnosis: Multifocal pneumonia [id number]  Admitting Physician: [place], [place]  Attending Physician: Quintella Baton [id number]  Certification:: I certify this patient will need inpatient services for at least 2 midnights  Estimated Length of Stay: 2           B Medical/Surgery History Past Medical History:  Diagnosis Date   Anemia    Anxiety    Cervical spondylosis without myelopathy 05/29/2015   COLONIC POLYPS, HX OF 02/07/2008   DEGENERATIVE JOINT DISEASE 10/14/2006   03/31/11: Pt states arthritis in her hip.   Depression    DIABETES MELLITUS, TYPE II 10/27/2009   GERD 10/14/2006   HYPERLIPIDEMIA 10/14/2006   HYPERTENSION 10/14/2006   off bp meds now   HYPOTHYROIDISM 10/14/2006   LOW BACK PAIN 08/04/2007   Obesity    Past Surgical History:  Procedure Laterality Date   BIOPSY  01/12/2019   Procedure: BIOPSY;  Surgeon: Otis Brace, MD;  [facility];  Service: Gastroenterology;;   Otho Darner SIGMOIDOSCOPY N/A 01/12/2019   Procedure: FLEXIBLE SIGMOIDOSCOPY;  Surgeon: Otis Brace, MD;  Location: [facility];  Service: Gastroenterology;  Laterality: N/A;   HEMORRHOID SURGERY     Left knee arthroscopic  April 2014   Dr. Durward Fortes   PARTIAL KNEE ARTHROPLASTY Left 09/23/2014   Procedure: LEFT UNICOMPARTMENTAL KNEE;  Surgeon: [name], MD;  Location: [facility];  Service: Orthopedics;  Laterality: Left;   TONSILLECTOMY     TUBAL LIGATION       A IV Location/Drains/Wounds Patient Lines/Drains/Airways Status     Active Line/Drains/Airways     Name Placement date Placement time Site Days   Peripheral IV 06/14/22 20 G Right Antecubital 06/14/22  1623  Antecubital  1   Peripheral IV 06/14/22 20 G Anterior;Right Forearm 06/14/22  1642  Forearm  1   External Urinary Catheter 06/15/22  0612  --  less than 1            Intake/Output Last 24 hours  Intake/Output Summary (Last 24 hours) at 06/15/2022 1528 Last data filed at 06/15/2022 0645 Gross per 24 hour  Intake 1353.71 ml  Output --  Net 1353.71 ml    Labs/Imaging Results for orders placed or performed during the hospital encounter of 06/14/22 (from the past 48 hour(s))  Resp panel by RT-PCR (RSV, Flu A&B, Covid) Anterior Nasal Swab     Status: None    Collection Time: 06/14/22  3:22 PM   Specimen: Anterior Nasal Swab  Result Value Ref Range   SARS Coronavirus 2 by RT PCR NEGATIVE NEGATIVE   Influenza A by PCR NEGATIVE NEGATIVE   Influenza B by PCR NEGATIVE NEGATIVE    Comment: (NOTE) The Xpert Xpress SARS-CoV-2/FLU/RSV plus assay is intended as an aid in the diagnosis of influenza from Nasopharyngeal swab specimens and should not be used as a sole basis for treatment. Nasal washings and aspirates are unacceptable for Xpert Xpress SARS-CoV-2/FLU/RSV testing.  Fact Sheet for Patients: EntrepreneurPulse.com.au  Fact Sheet for Healthcare Providers: IncredibleEmployment.be  This test is not yet approved or cleared by the [place] FDA and has been authorized for detection and/or diagnosis of SARS-CoV-2 by FDA under an Emergency Use Authorization (EUA). This EUA will remain in effect (meaning this test can be used) for the duration of the COVID-19 declaration under Section 564(b)(1) of the Act, 21 U.S.C. section 360bbb-3(b)(1), unless the authorization is terminated or revoked.     Resp Syncytial Virus by PCR NEGATIVE NEGATIVE    Comment: (NOTE) Fact Sheet for Patients: EntrepreneurPulse.com.au  Fact Sheet for Healthcare Providers: IncredibleEmployment.be  This test is not yet approved or cleared by the [place] FDA and has been authorized for detection and/or diagnosis of SARS-CoV-2 by FDA under an Emergency Use Authorization (EUA). This EUA will remain in effect (meaning this test can be used) for the duration of the COVID-19 declaration under Section 564(b)(1) of the Act, 21 U.S.C. section 360bbb-3(b)(1), unless the authorization is terminated or revoked.  Performed at [facility], [place] [address] [place], [place] [address]   Lactic acid, plasma     Status: None   Collection Time: 06/14/22  4:18 PM  Result Value Ref Range   Lactic  Acid, Venous 1.2 0.5 - 1.9 mmol/L    Comment: Performed at [facility] [address], [place], [place] [address]  Protime-INR     Status: None   Collection Time: 06/14/22  4:18 PM  Result Value Ref Range   Prothrombin Time 13.4 11.4 - 15.2 seconds   INR 1.0 0.8 - 1.2    Comment: (NOTE) INR goal varies based on device and disease states. Performed at [facility], [place] [address], [place], [place] [address]   APTT     Status: None   Collection Time: 06/14/22  4:18 PM  Result Value Ref Range   aPTT 35 24 - 36 seconds    Comment: Performed at [facility] [address] [place], [place] [address]  CBC with Differential     Status: Abnormal   Collection Time: 06/14/22  4:24 PM  Result Value Ref Range   WBC 16.5 (H) 4.0 - 10.5 K/uL   RBC 4.18 3.87 - 5.11 MIL/uL   Hemoglobin 12.1 12.0 - 15.0 g/dL  HCT 36.5 36.0 - 46.0 %   MCV 87.3 80.0 - 100.0 fL   MCH 28.9 26.0 - 34.0 pg   MCHC 33.2 30.0 - 36.0 g/dL   RDW 15.5 11.5 - 15.5 %   Platelets 348 150 - 400 K/uL   nRBC 0.0 0.0 - 0.2 %   Neutrophils Relative % 95 %   Neutro Abs 15.7 (H) 1.7 - 7.7 K/uL   Lymphocytes Relative 1 %   Lymphs Abs 0.2 (L) 0.7 - 4.0 K/uL   Monocytes Relative 3 %   Monocytes Absolute 0.5 0.1 - 1.0 K/uL   Eosinophils Relative 0 %   Eosinophils Absolute 0.0 0.0 - 0.5 K/uL   Basophils Relative 1 %   Basophils Absolute 0.2 (H) 0.0 - 0.1 K/uL   nRBC 0 0 /100 WBC   Abs Immature Granulocytes 0.00 0.00 - 0.07 K/uL    Comment: Performed at [facility] [address], [place], [place] [id number]  Basic metabolic panel     Status: Abnormal   Collection Time: 06/14/22  4:24 PM  Result Value Ref Range   Sodium 131 (L) 135 - 145 mmol/L   Potassium 4.3 3.5 - 5.1 mmol/L   Chloride 95 (L) 98 - 111 mmol/L   CO2 24 22 - 32 mmol/L   Glucose, Bld 228 (H) 70 - 99 mg/dL    Comment: Glucose reference range applies only to samples taken after fasting for at least 8 hours.   BUN 18 8 - 23 mg/dL    Creatinine, Ser 1.09 (H) 0.44 - 1.00 mg/dL   Calcium 8.4 (L) 8.9 - 10.3 mg/dL   GFR, Estimated 50 (L) >60 mL/min    Comment: (NOTE) Calculated using the CKD-EPI Creatinine Equation (2021)    Anion gap 12 5 - 15    Comment: Performed at [facility] [address], [place], [place] [address]  Blood Culture (routine x 2)     Status: None (Preliminary result)   Collection Time: 06/14/22  4:24 PM   Specimen: BLOOD  Result Value Ref Range   Specimen Description BLOOD RIGHT ANTECUBITAL    Special Requests      BOTTLES DRAWN AEROBIC AND ANAEROBIC Blood Culture results may not be optimal due to an excessive volume of blood received in culture bottles   Culture      NO GROWTH < 24 HOURS Performed at [facility] [address] [place], [place] [address]    Report Status PENDING   Brain natriuretic peptide     Status: None   Collection Time: 06/14/22  4:24 PM  Result Value Ref Range   B Natriuretic Peptide 87.8 0.0 - 100.0 pg/mL    Comment: Performed at [facility], [place] [address], [place], [place] [address]  Troponin I (High Sensitivity)     Status: Abnormal   Collection Time: 06/14/22  4:24 PM  Result Value Ref Range   Troponin I (High Sensitivity) 40 (H) <18 ng/L    Comment: (NOTE) Elevated high sensitivity troponin I (hsTnI) values and significant  changes across serial measurements may suggest ACS but many other  chronic and acute conditions are known to elevate hsTnI results.  Refer to the "Links" section for chest pain algorithms and additional  guidance. Performed at [facility], [place] [address] [place], [place] [address]   Blood Culture (routine x 2)     Status: None (Preliminary result)   Collection Time: 06/14/22  4:35  PM   Specimen: BLOOD RIGHT FOREARM  Result Value Ref Range   Specimen Description BLOOD RIGHT FOREARM    Special Requests      BOTTLES DRAWN AEROBIC AND ANAEROBIC Blood Culture results may not be optimal due to an excessive  volume of blood received in culture bottles   Culture      NO GROWTH < 24 HOURS Performed at [facility] [address], [place], [place] [address]    Report Status PENDING   Strep pneumoniae urinary antigen     Status: None   Collection Time: 06/14/22  4:55 PM  Result Value Ref Range   Strep Pneumo Urinary Antigen NEGATIVE NEGATIVE    Comment:        Infection due to S. pneumoniae cannot be absolutely ruled out since the antigen present may be below the detection limit of the test. Performed at [facility], [address] [place], [place] [address]   Urinalysis, w/ Reflex to Culture (Infection Suspected) -Urine, Clean Catch     Status: Abnormal   Collection Time: 06/14/22  5:08 PM  Result Value Ref Range   Specimen Source URINE, CLEAN CATCH    Color, Urine YELLOW YELLOW   APPearance CLEAR CLEAR   Specific Gravity, Urine 1.025 1.005 - 1.030   pH 5.5 5.0 - 8.0   Glucose, UA NEGATIVE NEGATIVE mg/dL   Hgb urine dipstick NEGATIVE NEGATIVE   Bilirubin Urine SMALL (A) NEGATIVE   Ketones, ur NEGATIVE NEGATIVE mg/dL   Protein, ur 30 (A) NEGATIVE mg/dL   Nitrite NEGATIVE NEGATIVE   Leukocytes,Ua NEGATIVE NEGATIVE   Squamous Epithelial / HPF 0-5 0 - 5 /HPF   WBC, UA 0-5 0 - 5 WBC/hpf    Comment: Reflex urine culture not performed if WBC <=10, OR if Squamous epithelial cells >5. If Squamous epithelial cells >5, suggest recollection.   RBC / HPF 0-5 0 - 5 RBC/hpf   Bacteria, UA RARE (A) NONE SEEN   Mucus PRESENT    Hyaline Casts, UA PRESENT     Comment: Performed at [facility], [place] [address], [place], [place] [address]  Troponin I (High Sensitivity)     Status: Abnormal   Collection Time: 06/14/22  7:40 PM  Result Value Ref Range   Troponin I (High Sensitivity) 40 (H) <18 ng/L    Comment: (NOTE) Elevated high sensitivity troponin I (hsTnI) values and significant  changes across serial measurements may suggest ACS but many other  chronic and acute  conditions are known to elevate hsTnI results.  Refer to the "Links" section for chest pain algorithms and additional  guidance. Performed at [facility], [place] [address], [place], [place] [address]   Procalcitonin     Status: None   Collection Time: 06/14/22  7:40 PM  Result Value Ref Range   Procalcitonin 0.38 ng/mL    Comment:        Interpretation: PCT (Procalcitonin) <= 0.5 ng/mL: Systemic infection (sepsis) is not likely. Local bacterial infection is possible. (NOTE)       Sepsis PCT Algorithm           Lower Respiratory Tract                                      Infection PCT Algorithm    ----------------------------     ----------------------------  PCT < 0.25 ng/mL                PCT < 0.10 ng/mL          Strongly encourage             Strongly discourage   discontinuation of antibiotics    initiation of antibiotics    ----------------------------     -----------------------------       PCT 0.25 - 0.50 ng/mL            PCT 0.10 - 0.25 ng/mL               OR       >80% decrease in PCT            Discourage initiation of                                            antibiotics      Encourage discontinuation           of antibiotics    ----------------------------     -----------------------------         PCT >= 0.50 ng/mL              PCT 0.26 - 0.50 ng/mL               AND        <80% decrease in PCT             Encourage initiation of                                             antibiotics       Encourage continuation           of antibiotics    ----------------------------     -----------------------------        PCT >= 0.50 ng/mL                  PCT > 0.50 ng/mL               AND         increase in PCT                  Strongly encourage                                      initiation of antibiotics    Strongly encourage escalation           of antibiotics                                     -----------------------------                                            PCT <= 0.25 ng/mL  OR                                        > 80% decrease in PCT                                      Discontinue / Do not initiate                                             antibiotics  Performed at [facility], [place] [address], [place], [place] [address]   Lactic acid, plasma     Status: None   Collection Time: 06/15/22  1:33 AM  Result Value Ref Range   Lactic Acid, Venous 1.2 0.5 - 1.9 mmol/L    Comment: Performed at [facility] [address], [place], [place] [address]  CBC with Differential/Platelet     Status: Abnormal   Collection Time: 06/15/22  1:33 AM  Result Value Ref Range   WBC 17.2 (H) 4.0 - 10.5 K/uL   RBC 3.75 (L) 3.87 - 5.11 MIL/uL   Hemoglobin 10.5 (L) 12.0 - 15.0 g/dL   HCT 33.4 (L) 36.0 - 46.0 %   MCV 89.1 80.0 - 100.0 fL   MCH 28.0 26.0 - 34.0 pg   MCHC 31.4 30.0 - 36.0 g/dL   RDW 15.6 (H) 11.5 - 15.5 %   Platelets 317 150 - 400 K/uL   nRBC 0.0 0.0 - 0.2 %   Neutrophils Relative % 85 %   Neutro Abs 14.6 (H) 1.7 - 7.7 K/uL   Lymphocytes Relative 11 %   Lymphs Abs 1.9 0.7 - 4.0 K/uL   Monocytes Relative 4 %   Monocytes Absolute 0.7 0.1 - 1.0 K/uL   Eosinophils Relative 0 %   Eosinophils Absolute 0.0 0.0 - 0.5 K/uL   Basophils Relative 0 %   Basophils Absolute 0.0 0.0 - 0.1 K/uL   WBC Morphology See Note     Comment: Mild Left Shift. 1 to 5% Metas and Myelos, Occ Pro Noted.   nRBC 0 0 /100 WBC   Abs Immature Granulocytes 0.00 0.00 - 0.07 K/uL    Comment: Performed at [facility], [place] [address] [place], [place] [id number]  Basic metabolic panel     Status: Abnormal   Collection Time: 06/15/22  1:33 AM  Result Value Ref Range   Sodium 131 (L) 135 - 145 mmol/L   Potassium 4.1 3.5 - 5.1 mmol/L   Chloride 98 98 - 111 mmol/L   CO2 22 22 - 32 mmol/L   Glucose, Bld 228 (H) 70 - 99 mg/dL    Comment: Glucose reference range applies only to samples taken  after fasting for at least 8 hours.   BUN 19 8 - 23 mg/dL   Creatinine, Ser 1.04 (H) 0.44 - 1.00 mg/dL   Calcium 7.9 (L) 8.9 - 10.3 mg/dL   GFR, Estimated 53 (L) >60 mL/min    Comment: (NOTE) Calculated using the CKD-EPI Creatinine Equation (2021)    Anion gap 11 5 - 15    Comment: Performed at [facility] [address] [place], [place] [address]  Magnesium  Status: None   Collection Time: 06/15/22  1:33 AM  Result Value Ref Range   Magnesium 1.7 1.7 - 2.4 mg/dL    Comment: Performed at [facility] [address] [place], [place] [address]  TSH     Status: Abnormal   Collection Time: 06/15/22  1:33 AM  Result Value Ref Range   TSH 38.398 (H) 0.350 - 4.500 uIU/mL    Comment: Performed by a 3rd Generation assay with a functional sensitivity of <=0.01 uIU/mL. Performed at [facility], [place] [address], [place], [place] [address]   CBG monitoring, ED     Status: Abnormal   Collection Time: 06/15/22  3:16 AM  Result Value Ref Range   Glucose-Capillary 214 (H) 70 - 99 mg/dL    Comment: Glucose reference range applies only to samples taken after fasting for at least 8 hours.  CBG monitoring, ED     Status: Abnormal   Collection Time: 06/15/22  8:04 AM  Result Value Ref Range   Glucose-Capillary 178 (H) 70 - 99 mg/dL    Comment: Glucose reference range applies only to samples taken after fasting for at least 8 hours.  CBG monitoring, ED     Status: Abnormal   Collection Time: 06/15/22  8:54 AM  Result Value Ref Range   Glucose-Capillary 208 (H) 70 - 99 mg/dL    Comment: Glucose reference range applies only to samples taken after fasting for at least 8 hours.  CBG monitoring, ED     Status: Abnormal   Collection Time: 06/15/22 12:40 PM  Result Value Ref Range   Glucose-Capillary 148 (H) 70 - 99 mg/dL    Comment: Glucose reference range applies only to samples taken after fasting for at least 8 hours.   CT Head Wo Contrast  Result Date:  06/14/2022 CLINICAL DATA:  Altered mental status. EXAM: CT HEAD WITHOUT CONTRAST TECHNIQUE: Contiguous axial images were obtained from the base of the skull through the vertex without intravenous contrast. RADIATION DOSE REDUCTION: This exam was performed according to the departmental dose-optimization program which includes automated exposure control, adjustment of the mA and/or kV according to patient size and/or use of iterative reconstruction technique. COMPARISON:  February 09, 2019 FINDINGS: Brain: There is mild cerebral atrophy with widening of the extra-axial spaces and ventricular dilatation. There are areas of decreased attenuation within the white matter tracts of the supratentorial brain, consistent with microvascular disease changes. 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 Vascular: No hyperdense vessel or unexpected calcification. Skull: Normal. Negative for fracture or focal lesion. Sinuses/Orbits: No acute finding. Other: None. IMPRESSION: 1. No acute intracranial abnormality. 2. Generalized cerebral atrophy and microvascular disease changes of the supratentorial brain. 3. 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 Electronically Signed   By: Virgina Norfolk M.D.   On: 06/14/2022 20:52   CT Angio Chest PE W and/or Wo Contrast  Result Date: 06/14/2022 CLINICAL DATA:  High probability for PE.  Cough. EXAM: CT ANGIOGRAPHY CHEST WITH CONTRAST TECHNIQUE: Multidetector CT imaging of the chest was performed using the standard protocol during bolus administration of intravenous contrast. Multiplanar CT image reconstructions and MIPs were obtained to evaluate the vascular anatomy. RADIATION DOSE REDUCTION: This exam was performed according to the departmental dose-optimization program which includes automated exposure control, adjustment of the mA and/or kV according to patient size and/or use of iterative reconstruction technique. CONTRAST:  88m OMNIPAQUE IOHEXOL  350 MG/ML SOLN COMPARISON:  CT angiogram chest 01/06/2019 FINDINGS:  Cardiovascular: Satisfactory opacification of the pulmonary arteries to the segmental level. No evidence of pulmonary embolism. Normal heart size. No pericardial effusion. There are atherosclerotic calcifications of the aorta. Mediastinum/Nodes: There is diffuse esophageal wall thickening. There is a small hiatal hernia. No enlarged lymph nodes are seen. The visualized esophagus is within normal limits. Lungs/Pleura: There is a focal rounded airspace opacity this is which is masslike in configuration abutting the pleura in the left upper lobe measuring 4.4 x 2.8 by 4.0 cm. There is a second rounded masslike density in the right lower lobe abutting the pleura measuring 3.9 by 3.8 by 3.7 cm image 6/95. This area has a mildly hypodense region centrally. Third masslike area is seen in the superior segment of the right lower lobe abutting the pleura measuring 6.4 x 3.4 5.7 cm. This is hypodense centrally with a few small bubbles of air seen in the nondependent portion. There is ground-glass opacity and smooth interlobular septal thickening surrounding these 3 regions. There are small pleural effusions, right greater than left with compressive atelectasis in the bilateral lower lobes. No pneumothorax visualized. Upper Abdomen: No acute abnormality. Musculoskeletal: Degenerative changes affect the spine. No acute fracture or focal osseous lesion. Review of the MIP images confirms the above findings. IMPRESSION: 1. No evidence for pulmonary embolism. 2. There are 3 rounded masslike airspace opacities seen bilaterally. Two areas are hypodense centrally with 1 area containing central air bubbles. Findings are worrisome for multifocal pneumonia including cavitary pneumonias or possible early abscesses. Neoplasm with necrosis can not be excluded. 3. Small bilateral pleural effusions with compressive atelectasis in the lower lobes. 4. Diffuse esophageal wall  thickening worrisome for esophagitis. Aortic Atherosclerosis (ICD10-I70.0). Electronically Signed   By: Ronney Asters M.D.   On: 06/14/2022 19:30   DG Chest 2 View  Result Date: 06/14/2022 CLINICAL DATA:  Cough. EXAM: CHEST - 2 VIEW COMPARISON:  September 29, 2013. FINDINGS: Stable cardiomediastinal silhouette. Small bilateral pleural effusions are noted. Lobular density is noted laterally in right midlung which may represent pneumonia, but neoplasm cannot be excluded. Bony thorax is unremarkable. IMPRESSION: Lobular density seen laterally in right mid lung which may represent pneumonia, but CT scan cannot be excluded. CT scan of the chest is recommended for further evaluation. Small bilateral pleural effusions. Electronically Signed   By: Marijo Conception M.D.   On: 06/14/2022 15:50    Pending Labs Unresulted Labs (From admission, onward)     Start     Ordered   06/21/22 0500  Creatinine, serum  (enoxaparin (LOVENOX)    CrCl >/= 30 ml/min)  Weekly,   R     Comments: while on enoxaparin therapy    06/14/22 2214   06/15/22 1341  T4, free  Add-on,   AD        06/15/22 1340   06/15/22 0913  MRSA Next Gen by PCR, Nasal  (MRSA Screening)  Once,   R        06/15/22 0912   06/15/22 0012  Hemoglobin A1c  Once,   R       Comments: To assess prior glycemic control    06/15/22 0011   06/14/22 2215  Legionella Pneumophila Serogp 1 Ur Ag  Once,   AD        06/14/22 2214            Vitals/Pain Today's Vitals   06/15/22 0920 06/15/22 1230 06/15/22 1355 06/15/22 1515  BP: 110/64 109/65  132/64  Pulse: 99 93  97  Resp: (!) 24 (!) 21  (!) 23  Temp: 98.3 F (36.8 C)  98.8 F (37.1 C)   TempSrc: Oral     SpO2: 100% 93%  92%  PainSc:        Isolation Precautions No active isolations  Medications Medications  azithromycin (ZITHROMAX) 500 mg in sodium chloride 0.9 % 250 mL IVPB (0 mg Intravenous Stopped 06/14/22 1915)  metroNIDAZOLE (FLAGYL) IVPB 500 mg (0 mg Intravenous Stopped 06/15/22 1017)   vancomycin (VANCOCIN) IVPB 1000 mg/200 mL premix (has no administration in time range)  enoxaparin (LOVENOX) injection 40 mg (40 mg Subcutaneous Given 06/15/22 0911)  albuterol (PROVENTIL) (2.5 MG/3ML) 0.083% nebulizer solution 2.5 mg (has no administration in time range)  guaiFENesin (ROBITUSSIN) 100 MG/5ML liquid 5 mL (has no administration in time range)  cefTRIAXone (ROCEPHIN) 2 g in sodium chloride 0.9 % 100 mL IVPB (has no administration in time range)  insulin aspart (novoLOG) injection 0-15 Units (2 Units Subcutaneous Given 06/15/22 1311)  insulin aspart (novoLOG) injection 0-5 Units (2 Units Subcutaneous Given 06/15/22 0323)  levothyroxine (SYNTHROID) tablet 175 mcg (175 mcg Oral Given 06/15/22 0656)  insulin detemir (LEVEMIR) injection 5 Units (5 Units Subcutaneous Given 06/15/22 0911)  0.9 %  sodium chloride infusion ( Intravenous New Bag/Given 06/15/22 1521)  lactated ringers bolus 1,000 mL (0 mLs Intravenous Stopped 06/14/22 1915)  cefTRIAXone (ROCEPHIN) 2 g in sodium chloride 0.9 % 100 mL IVPB (0 g Intravenous Stopped 06/14/22 1747)  iohexol (OMNIPAQUE) 350 MG/ML injection 75 mL (75 mLs Intravenous Contrast Given 06/14/22 1909)  vancomycin (VANCOREADY) IVPB 1500 mg/300 mL (0 mg Intravenous Stopped 06/14/22 2352)  sodium chloride 0.9 % bolus 1,000 mL (0 mLs Intravenous Stopped 06/15/22 0645)    Mobility walks with device     Focused Assessments Pulmonary Assessment Handoff:  Lung sounds: Bilateral Breath Sounds: Diminished L Breath Sounds: Clear R Breath Sounds: Diminished O2 Device: Nasal Cannula O2 Flow Rate (L/min): 3 L/min    R Recommendations: See Admitting Provider Note  Report given to:   Additional Notes: pt have not voided for me she is on a purwick. The bladder scan is not charge down in the ED for me to scan her bladder. I sent a chat to Dr Pietro Cassis to let him know. He have not responded yet. She is now on NS at 75cc/hr.

## 2022-06-15 NOTE — Consult Note (Signed)
NAME:  Leslie Benton, MRN:  [id number], DOB:  May 21, 1940, LOS: 1 ADMISSION DATE:  06/14/2022, CONSULTATION DATE:  2/27 REFERRING MD:  Pietro Cassis, CHIEF COMPLAINT:  Confusion and difficult breathing  History of Present Illness:  82 year old female with ulcerative colitis presented on June 14, 2022 complaining confusion, cough, fatigue and difficulty breathing.  Pulmonary and critical care medicine was consulted due to what appeared to be multifocal cavitary pneumonia.  She says that she has had some sinus congestion recently but does not feel that she has had any chest congestion.  She notes that she has been somewhat confused.  Does not feel short of breath.  Denies weight loss.  Denies regurgitation but says that from time to time food will go down the wrong pipe.  No fevers or chills  Exam limited by some degree of confusion and limited awareness of the patient in regards to current circumstances  She tells me that she has never smoked cigarettes.  Pertinent  Medical History  Degenerative joint disease Hypertension Hyperlipidemia GERD Diabetes mellitus type 2 Depression Anxiety Ulcerative colitis, on mesalamine Hypothyroidism  Significant Hospital Events: Including procedures, antibiotic start and stop dates in addition to other pertinent events   June 14, 2022 CT angiogram chest images independently reviewed showing multifocal pneumonia, most prominent left upper lobe and right lower lobe very rounded, fairly well-circumscribed with central necrosis.  Trace pleural effusion right base, some reactive adenopathy, no pulmonary embolism, moderate to large hiatal hernia  Interim History / Subjective:  As above  Objective   Blood pressure 109/65, pulse 93, temperature 98.8 F (37.1 C), resp. rate (!) 21, SpO2 93 %.        Intake/Output Summary (Last 24 hours) at 06/15/2022 1447 Last data filed at 06/15/2022 0645 Gross per 24 hour  Intake 1353.71 ml  Output --  Net 1353.71 ml    There were no vitals filed for this visit.  Examination:  General:  Resting comfortably in bed HENT: NCAT OP clear PULM: Rhonchi left upper lobe, normal effort CV: RRR, no mgr GI: BS+, soft, nontender MSK: normal bulk and tone Neuro: awake, alert, no distress, MAEW   Resolved Hospital Problem list     Assessment & Plan:  Multifocal pneumonia Community-acquired pneumonia Hiatal hernia Physical deconditioning History of ulcerative colitis  Discussion: Somewhat unusual appearing multifocal pneumonia presentation.  Consider aspiration, consider esophageal dysmotility.  Given the appearance on chest imaging would plan for 21 days of antibiotic therapy, however at this time given the fact that she is comfortable on room air and seems to be doing fairly well I do not think the benefits of a bronchoscopy outweigh the risks.  We do need to keep a close eye on this so she needs to have repeat chest imaging in 6 to 8 weeks.  Plan: Agree with hospital admission Will treat with routine IV antibiotics for community-acquired pneumonia: Ceftriaxone, azithromycin Recommend speech therapy evaluation for dysphagia Consider barium esophagram given hiatal hernia She needs to have an outpatient CT scan of the chest performed in 6 to 8 weeks by her primary care physician  If no improvement in infiltrates or if she is worse in any way please do not hesitate to call us back  Pulmonary and critical care medicine available as needed   Best Practice (right click and "Reselect all SmartList Selections" daily)   CODE STATUS DNR  Labs   CBC: Recent Labs  Lab 06/14/22 1624 06/15/22 0133  WBC 16.5* 17.2*  NEUTROABS 15.7* 14.6*  HGB 12.1 10.5*  HCT 36.5 33.4*  MCV 87.3 89.1  PLT 348 [id number]    Basic Metabolic Panel: Recent Labs  Lab 06/14/22 1624 06/15/22 0133  NA 131* 131*  K 4.3 4.1  CL 95* 98  CO2 24 22  GLUCOSE 228* 228*  BUN 18 19  CREATININE 1.09* 1.04*  CALCIUM 8.4* 7.9*  MG   --  1.7   GFR: CrCl cannot be calculated (Unknown ideal weight.). Recent Labs  Lab 06/14/22 1618 06/14/22 1624 06/14/22 1940 06/15/22 0133  PROCALCITON  --   --  0.38  --   WBC  --  16.5*  --  17.2*  LATICACIDVEN 1.2  --   --  1.2    Liver Function Tests: No results for input(s): "AST", "ALT", "ALKPHOS", "BILITOT", "PROT", "ALBUMIN" in the last 168 hours. No results for input(s): "LIPASE", "AMYLASE" in the last 168 hours. No results for input(s): "AMMONIA" in the last 168 hours.  ABG No results found for: "PHART", "PCO2ART", "PO2ART", "HCO3", "TCO2", "ACIDBASEDEF", "O2SAT"   Coagulation Profile: Recent Labs  Lab 06/14/22 1618  INR 1.0    Cardiac Enzymes: No results for input(s): "CKTOTAL", "CKMB", "CKMBINDEX", "TROPONINI" in the last 168 hours.  HbA1C: Hgb A1c MFr Bld  Date/Time Value Ref Range Status  12/05/2020 01:19 PM 13.6 (H) 4.6 - 6.5 % Final    Comment:    Glycemic Control Guidelines for People with Diabetes:Non Diabetic:  <6%Goal of Therapy: <7%Additional Action Suggested:  >8%   10/11/2018 03:26 PM 11.6 (H) 4.6 - 6.5 % Final    Comment:    Glycemic Control Guidelines for People with Diabetes:Non Diabetic:  <6%Goal of Therapy: <7%Additional Action Suggested:  >8%     CBG: Recent Labs  Lab 06/15/22 0316 06/15/22 0804 06/15/22 0854 06/15/22 1240  GLUCAP 214* 178* 208* 148*    Review of Systems:   Gen: Denies fever, chills, weight change, fatigue, night sweats HEENT: Denies blurred vision, double vision, hearing loss, tinnitus, sinus congestion, rhinorrhea, sore throat, neck stiffness, dysphagia PULM: per HPI CV: Denies chest pain, edema, orthopnea, paroxysmal nocturnal dyspnea, palpitations GI: Denies abdominal pain, nausea, vomiting, diarrhea, hematochezia, melena, constipation, change in bowel habits GU: Denies dysuria, hematuria, polyuria, oliguria, urethral discharge Endocrine: Denies hot or cold intolerance, polyuria, polyphagia or appetite  change Derm: Denies rash, dry skin, scaling or peeling skin change Heme: Denies easy bruising, bleeding, bleeding gums Neuro: Denies headache, numbness, weakness, slurred speech, loss of memory or consciousness   Past Medical History:  She,  has a past medical history of Anemia, Anxiety, Cervical spondylosis without myelopathy (05/29/2015), COLONIC POLYPS, HX OF (02/07/2008), DEGENERATIVE JOINT DISEASE (10/14/2006), Depression, DIABETES MELLITUS, TYPE II (10/27/2009), GERD (10/14/2006), HYPERLIPIDEMIA (10/14/2006), HYPERTENSION (10/14/2006), HYPOTHYROIDISM (10/14/2006), LOW BACK PAIN (08/04/2007), and Obesity.   Surgical History:   Past Surgical History:  Procedure Laterality Date   BIOPSY  01/12/2019   Procedure: BIOPSY;  Surgeon: Otis Brace, MD;  Location: [facility];  Service: Gastroenterology;;   FLEXIBLE SIGMOIDOSCOPY N/A 01/12/2019   Procedure: FLEXIBLE SIGMOIDOSCOPY;  Surgeon: Otis Brace, MD;  Location: [facility];  Service: Gastroenterology;  Laterality: N/A;   HEMORRHOID SURGERY     Left knee arthroscopic  April 2014   Dr. Durward Fortes   PARTIAL KNEE ARTHROPLASTY Left 09/23/2014   Procedure: LEFT UNICOMPARTMENTAL KNEE;  Surgeon: [name], MD;  Location: [facility];  Service: Orthopedics;  Laterality: Left;   TONSILLECTOMY     TUBAL LIGATION       Social History:   reports  that she has never smoked. She has never used smokeless tobacco. She reports that she does not drink alcohol and does not use drugs.   Family History:  Her family history includes Cancer in her brother and sister; Diabetes in her father; Heart attack (age of onset: 68) in her father; Kidney failure in her mother.   Allergies Allergies  Allergen Reactions   Niacin And Related Hives and Swelling    No anaphalaxis, but strong reactions.   Lactose Intolerance (Gi)     "bad taste in mouth"   Amoxicillin Swelling    hives   Penicillins Swelling    Hives With all cillins   Tilactase      Home  Medications  Prior to Admission medications   Medication Sig Start Date End Date Taking? Authorizing Provider  atorvastatin (LIPITOR) 10 MG tablet Take 10 mg by mouth daily.   Yes [provider]  levothyroxine (SYNTHROID) 175 MCG tablet TAKE 1 TABLET(175 MCG) BY MOUTH DAILY BEFORE BREAKFAST Patient taking differently: Take 175 mcg by mouth daily before breakfast. 11/30/19  Yes [name], MD  lisinopril (ZESTRIL) 10 MG tablet TAKE 1 TABLET(10 MG) BY MOUTH DAILY Patient taking differently: Take 10 mg by mouth daily. 01/03/20  Yes [name], MD  MELATONIN PO Take 1 tablet by mouth at bedtime as needed.   Yes [provider]  metFORMIN (GLUCOPHAGE) 1000 MG tablet Take 1 tablet (1,000 mg total) by mouth 2 (two) times daily with a meal. 03/05/19  Yes [name], MD  CRANBERRY PO Take by mouth. Patient not taking: Reported on 06/15/2022    [provider]  ibuprofen (ADVIL,MOTRIN) 200 MG tablet Take 400 mg by mouth as needed for headache or moderate pain.  Patient not taking: Reported on 06/15/2022    [provider]  loratadine (CLARITIN) 10 MG tablet Take 10 mg by mouth daily as needed for allergies or rhinitis.  Patient not taking: Reported on 06/15/2022    [provider]  mesalamine (CANASA) 1000 MG suppository Place 1 suppository (1,000 mg total) rectally 2 (two) times daily. Patient not taking: Reported on 06/15/2022 01/14/19 02/13/19  Donne Hazel, MD  mesalamine (PENTASA) 250 MG CR capsule Take 4 capsules (1,000 mg total) by mouth 4 (four) times daily. Patient not taking: Reported on 06/15/2022 01/14/19 02/13/19  Donne Hazel, MD  metoprolol tartrate (LOPRESSOR) 25 MG tablet Take 1 tablet (25 mg total) by mouth 4 (four) times daily. Patient not taking: Reported on 06/15/2022 01/14/19 02/13/19  Donne Hazel, MD  Multiple Vitamin (MULTIVITAMIN WITH MINERALS) TABS tablet Take 1 tablet by mouth daily. Patient not  taking: Reported on 06/15/2022    [provider]  QUEtiapine (SEROQUEL) 25 MG tablet TAKE 1 TO 2 TABLETS BY MOUTH AT NIGHT AS NEEDED Patient not taking: Reported on 06/15/2022 05/16/20   Kennyth Arnold, FNP     Critical care time: n/a    Roselie Awkward, MD [facility] Pager: [phone] Cell: [phone] After 7:00 pm call Elink  [phone]

## 2022-06-15 NOTE — ED Notes (Signed)
Fan taken to pt

## 2022-06-15 NOTE — ED Notes (Signed)
Pt had 25 runs of vtach at 1404, and Dr Pietro Cassis was made aware. Per MD continue to monitor

## 2022-06-16 DIAGNOSIS — J189 Pneumonia, unspecified organism: Secondary | ICD-10-CM | POA: Diagnosis not present

## 2022-06-16 LAB — CBC
HCT: 32.5 % — ABNORMAL LOW (ref 36.0–46.0)
Hemoglobin: 10.1 g/dL — ABNORMAL LOW (ref 12.0–15.0)
MCH: 27.5 pg (ref 26.0–34.0)
MCHC: 31.1 g/dL (ref 30.0–36.0)
MCV: 88.6 fL (ref 80.0–100.0)
Platelets: 316 10*3/uL (ref 150–400)
RBC: 3.67 MIL/uL — ABNORMAL LOW (ref 3.87–5.11)
RDW: 15.8 % — ABNORMAL HIGH (ref 11.5–15.5)
WBC: 12.4 10*3/uL — ABNORMAL HIGH (ref 4.0–10.5)
nRBC: 0 % (ref 0.0–0.2)

## 2022-06-16 LAB — BASIC METABOLIC PANEL
Anion gap: 13 (ref 5–15)
BUN: 17 mg/dL (ref 8–23)
CO2: 19 mmol/L — ABNORMAL LOW (ref 22–32)
Calcium: 7.9 mg/dL — ABNORMAL LOW (ref 8.9–10.3)
Chloride: 103 mmol/L (ref 98–111)
Creatinine, Ser: 1.06 mg/dL — ABNORMAL HIGH (ref 0.44–1.00)
GFR, Estimated: 52 mL/min — ABNORMAL LOW (ref >60)
Glucose, Bld: 290 mg/dL — ABNORMAL HIGH (ref 70–99)
Potassium: 3.9 mmol/L (ref 3.5–5.1)
Sodium: 135 mmol/L (ref 135–145)

## 2022-06-16 LAB — HEMOGLOBIN A1C
Hgb A1c MFr Bld: 9.4 % — ABNORMAL HIGH (ref 4.8–5.6)
Mean Plasma Glucose: 223 mg/dL

## 2022-06-16 LAB — GLUCOSE, CAPILLARY
Glucose-Capillary: 143 mg/dL — ABNORMAL HIGH (ref 70–99)
Glucose-Capillary: 229 mg/dL — ABNORMAL HIGH (ref 70–99)
Glucose-Capillary: 256 mg/dL — ABNORMAL HIGH (ref 70–99)
Glucose-Capillary: 144 mg/dL — ABNORMAL HIGH (ref 70–99)

## 2022-06-16 MED ORDER — CHLORHEXIDINE GLUCONATE CLOTH 2 % EX PADS
6.0000 | MEDICATED_PAD | Freq: Every day | CUTANEOUS | Status: DC
  Administered 2022-06-16 – 2022-06-17 (×2): 6 via TOPICAL

## 2022-06-16 NOTE — Evaluation (Signed)
Physical Therapy Evaluation Patient Details Name: Leslie Benton MRN: [id number] DOB: 1940-07-17 Today's Date: 06/16/2022  History of Present Illness  This is a 82 year old female admitted with  confusion, difficulty breathing, cough and fatigue. Pt found to have multifocal PNA. PMH: anxiety and depression, diabetes, hyperlipidemia, hypertension and hypothyroidism.   Clinical Impression  Pt admitted with above. Pt was ambulating with RW PTA and required assist with dressing and bathing however pt now with lethargy, poor attn, difficulty staying awake, minimal initiation, further deconditioning and impaired balance. At this time pt requires mod/maxA for mobility and is unable to ambulate, unsure that family is able to care for her at this level. Recommending SNF at this time however PT to continue to assess d/c recommendations in hopes pt responds well to IV antibiotics and improves both functionally and cognitively.       Recommendations for follow up therapy are one component of a multi-disciplinary discharge planning process, led by the attending physician.  Recommendations may be updated based on patient status, additional functional criteria and insurance authorization.  Follow Up Recommendations Skilled nursing-short term rehab (<3 hours/day) (will continue to assess as PNA clears up vs pt's response to IV antibiotics.) Can patient physically be transported by private vehicle: No    Assistance Recommended at Discharge Frequent or constant Supervision/Assistance  Patient can return home with the following  A lot of help with walking and/or transfers;A lot of help with bathing/dressing/bathroom;Assist for transportation;Help with stairs or ramp for entrance    Equipment Recommendations None recommended by PT (TBD)  Recommendations for Other Services       Functional Status Assessment Patient has had a recent decline in their functional status and/or demonstrates limited ability to make  significant improvements in function in a reasonable and predictable amount of time     Precautions / Restrictions Precautions Precautions: Fall Restrictions Weight Bearing Restrictions: No      Mobility  Bed Mobility Overal bed mobility: Needs Assistance Bed Mobility: Rolling, Sidelying to Sit Rolling: Max assist Sidelying to sit: Max assist       General bed mobility comments: max multimodal cues to initiate, maxA for LE management off EOB and for trunk elevation    Transfers Overall transfer level: Needs assistance Equipment used: 2 person hand held assist (face to face transfer with gait belt  and bed pad) Transfers: Sit to/from Stand, Bed to chair/wheelchair/BSC Sit to Stand: Mod assist, +2 physical assistance   Step pivot transfers: Mod assist, +2 physical assistance       General transfer comment: despite max verbal cues pt didnt use UEs to assist with transfer, once up and PT initiated weight shifting pt moved/stepped to the chair    Ambulation/Gait               General Gait Details: was able to take steps to the chair this date  Stairs            Wheelchair Mobility    Modified Rankin (Stroke Patients Only)       Balance Overall balance assessment: Needs assistance Sitting-balance support: Feet supported, Bilateral upper extremity supported Sitting balance-Leahy Scale: Poor Sitting balance - Comments: pt initially min guard to maintain EOB however with onset of fatigue s/p 1 min of sitting pt with L lateral lean and inability to maintain midline position   Standing balance support: During functional activity Standing balance-Leahy Scale: Zero Standing balance comment: dependent on external support  Pertinent Vitals/Pain Pain Assessment Pain Assessment: No/denies pain    Home Living Family/patient expects to be discharged to:: Private residence Living Arrangements: Spouse/significant  other;Children (husband and son) Available Help at Discharge: Family;Available PRN/intermittently Type of Home: House Home Access: Stairs to enter Entrance Stairs-Rails: Right Entrance Stairs-Number of Steps: 3   Home Layout: One level Home Equipment: Conservation officer, nature (2 wheels);Rollator (4 wheels);BSC/3in1 Additional Comments: home set up taken from previous admission as pt confused and poor historian, unsure of accuracy of report    Prior Function Prior Level of Function : Needs assist             Mobility Comments: uses RW ADLs Comments: pt reports her RN aide helps with dressing and bathing, someone else does the cooking and that sometimes she can feed herself and sometimes someone else feeds her     Hand Dominance   Dominant Hand: Right    Extremity/Trunk Assessment   Upper Extremity Assessment Upper Extremity Assessment: Generalized weakness    Lower Extremity Assessment Lower Extremity Assessment: Generalized weakness    Cervical / Trunk Assessment Cervical / Trunk Assessment: Kyphotic  Communication   Communication: Expressive difficulties (soft spoken,delayed response time)  Cognition Arousal/Alertness: Lethargic Behavior During Therapy: Flat affect Overall Cognitive Status: No family/caregiver present to determine baseline cognitive functioning                                 General Comments: per chart pt with dementia at baseline however not written in formal PMH section. Pt lethargic/sleepy, delayed response time and is A&Ox4. Pt with decreased insight to deficits and safety with decreased initiation requiring max verbal and tactile cues        General Comments General comments (skin integrity, edema, etc.): VSS    Exercises     Assessment/Plan    PT Assessment Patient needs continued PT services  PT Problem List Decreased strength;Decreased activity tolerance;Decreased range of motion;Decreased balance;Decreased mobility;Decreased  coordination;Decreased cognition       PT Treatment Interventions DME instruction;Gait training;Stair training;Functional mobility training;Therapeutic activities;Therapeutic exercise;Balance training    PT Goals (Current goals can be found in the Care Plan section)  Acute Rehab PT Goals Patient Stated Goal: didn't state PT Goal Formulation: With patient Time For Goal Achievement: 06/30/22 Potential to Achieve Goals: Good    Frequency Min 3X/week     Co-evaluation               AM-PAC PT "6 Clicks" Mobility  Outcome Measure Help needed turning from your back to your side while in a flat bed without using bedrails?: A Lot Help needed moving from lying on your back to sitting on the side of a flat bed without using bedrails?: A Lot Help needed moving to and from a bed to a chair (including a wheelchair)?: A Lot Help needed standing up from a chair using your arms (e.g., wheelchair or bedside chair)?: A Lot Help needed to walk in hospital room?: Total Help needed climbing 3-5 steps with a railing? : Total 6 Click Score: 10    End of Session Equipment Utilized During Treatment: Gait belt Activity Tolerance: Patient limited by lethargy Patient left: in chair;with call bell/phone within reach (spoke with tech to find a chair alarm box for pt room, pad under patient) Nurse Communication: Mobility status PT Visit Diagnosis: Unsteadiness on feet (R26.81);Difficulty in walking, not elsewhere classified (R26.2)    Time: [id number] PT Time  Calculation (min) (ACUTE ONLY): 21 min   Charges:   PT Evaluation $PT Eval Moderate Complexity: 1 Mod          Kittie Plater, PT, DPT Acute Rehabilitation Services Secure chat preferred Office #: [phone]   Berline Lopes 06/16/2022, 11:29 AM

## 2022-06-16 NOTE — Progress Notes (Signed)
   Durable Medical Equipment (From admission, onward)        Start     Ordered  06/16/22 1528  For home use only DME Hospital bed  Once      Question Answer Comment Length of Need Lifetime  Bed type Semi-electric  Support Surface: Gel Overlay    06/16/22 1527

## 2022-06-16 NOTE — Evaluation (Signed)
Clinical/Bedside Swallow Evaluation Patient Details  Name: Leslie Benton MRN: [id number] Date of Birth: [birth year]  Today's Date: 06/16/2022 Time: SLP Start Time (ACUTE ONLY): [id number] SLP Stop Time (ACUTE ONLY): 1057 SLP Time Calculation (min) (ACUTE ONLY): 10 min  Past Medical History:  Past Medical History:  Diagnosis Date   Anemia    Anxiety    Cervical spondylosis without myelopathy 05/29/2015   COLONIC POLYPS, HX OF 02/07/2008   DEGENERATIVE JOINT DISEASE 10/14/2006   03/31/11: Pt states arthritis in her hip.   Depression    DIABETES MELLITUS, TYPE II 10/27/2009   GERD 10/14/2006   HYPERLIPIDEMIA 10/14/2006   HYPERTENSION 10/14/2006   off bp meds now   HYPOTHYROIDISM 10/14/2006   LOW BACK PAIN 08/04/2007   Obesity    Past Surgical History:  Past Surgical History:  Procedure Laterality Date   BIOPSY  01/12/2019   Procedure: BIOPSY;  Surgeon: Otis Brace, MD;  Location: [facility];  Service: Gastroenterology;;   Otho Darner SIGMOIDOSCOPY N/A 01/12/2019   Procedure: FLEXIBLE SIGMOIDOSCOPY;  Surgeon: Otis Brace, MD;  Location: [facility];  Service: Gastroenterology;  Laterality: N/A;   HEMORRHOID SURGERY     Left knee arthroscopic  April 2014   Dr. Durward Fortes   PARTIAL KNEE ARTHROPLASTY Left 09/23/2014   Procedure: LEFT UNICOMPARTMENTAL KNEE;  Surgeon: [name], MD;  Location: [facility];  Service: Orthopedics;  Laterality: Left;   TONSILLECTOMY     TUBAL LIGATION     HPI:  Leslie Benton is a 82 y.o. female who presented to ED on 2/26 with complaint of cough for 1-2 weeks and an episode of gasping for air.  Dx sepsis, multifocal pna (concern for abscess or malignancy), encephalopathy. PMH significant for DM2, HTN, HLD, hypothyroidism, anxiety/depression, chronic anemia, degenerative joint disease.    Assessment / Plan / Recommendation  Clinical Impression  Pt presents with functional oropharyngeal swallow c/b thorough mastication, palpable swallow, no s/s of aspiration  when swallowing sequential boluses of thin liquids. Able to eat solids and follow immediately with liquids with no concern for aspiration. No dysphagia identified. Continue current diet - regular solids, thin liquids. No SLP f/u needed. Our service will sign off. SLP Visit Diagnosis: Dysphagia, unspecified (R13.10)    Aspiration Risk  No limitations    Diet Recommendation     Medication Administration: Whole meds with liquid    Other  Recommendations Oral Care Recommendations: Oral care BID    Recommendations for follow up therapy are one component of a multi-disciplinary discharge planning process, led by the attending physician.  Recommendations may be updated based on patient status, additional functional criteria and insurance authorization.  Follow up Recommendations No SLP follow up        [place] Date of Onset: 06/14/22 HPI: Leslie Benton is a 82 y.o. female who presented to ED on 2/26 with complaint of cough for 1-2 weeks and an episode of gasping for air.  Dx sepsis, multifocal pna (concern for abscess or malignancy), encephalopathy. PMH significant for DM2, HTN, HLD, hypothyroidism, anxiety/depression, chronic anemia, degenerative joint disease. Type of Study: Bedside Swallow Evaluation Previous Swallow Assessment: no Diet Prior to this Study: Regular;Thin liquids (Level 0) Temperature Spikes Noted: No Respiratory Status: Room air History of Recent Intubation: No Behavior/Cognition: Alert Oral Cavity Assessment: Within Functional Limits Oral Care Completed by SLP: No Oral Cavity - Dentition: Adequate natural dentition Vision: Functional for self-feeding Self-Feeding Abilities: Needs assist;Able to feed self Patient Positioning: Upright in chair Baseline Vocal  Quality: Normal Volitional Cough: Strong Volitional Swallow: Able to elicit    Oral/Motor/Sensory Function Overall Oral Motor/Sensory Function: Within functional limits   Ice Chips Ice chips:  Within functional limits   Thin Liquid Thin Liquid: Within functional limits    Nectar Thick Nectar Thick Liquid: Not tested   Honey Thick Honey Thick Liquid: Not tested   Puree Puree: Within functional limits   Solid     Solid: Within functional limits      [name] 06/16/2022,11:03 AM  [name], MA CCC/SLP Clinical Specialist - [place] Office number [phone]

## 2022-06-16 NOTE — Progress Notes (Addendum)
PROGRESS NOTE  Leslie Benton  DOB: 17-Dec-1940  PCP: [name], MD HS:[id number]  DOA: 06/14/2022  LOS: 2 days  Hospital Day: 3  Brief narrative: Leslie Benton is a 82 y.o. female with PMH significant for DM2, HTN, HLD, hypothyroidism, anxiety/depression, chronic anemia, degenerative joint disease. 2/26, patient was brought to the ED from home with complaint of cough for 1-2 weeks and an episode of gasping for air.  In the ED, temperature 100.5, heart rate 100s, blood pressure 142/90, required 2 to 3 L oxygen by nasal cannula. Labs with WC count elevated to 16.5, lactic acid level normal, sodium 131, glucose 228, troponin elevated to 40 Respiratorye virus panel negative for influenza, COVID CT angio of chest was negative for pulm embolism.  But it showed 3 rounded masslike opacities bilaterally. Two areas are hypodense centrally with 1 area containing central air bubbles. Findings are worrisome for multifocal pneumonia including cavitary pneumonias or possible early abscesses. Neoplasm with necrosis can not be excluded.  Blood culture sent. Patient was started on IV Rocephin, vancomycin, azithromycin and Flagyl. Admitted to [facility] Pulmonary consultation was obtained.  Subjective: Patient was seen and examined this morning.  Lying on bed.  More awake, alert and conversational than yesterday.  Not in pain.  Slept well last night Family not at bedside.  Assessment and plan: Sepsis - POA multifocal pneumonia, concern for abscess or malignancy Presented with cough, fatigue for 1 to 2 weeks and an episode of shortness of breath Met sepsis criteria with tachycardia, tachypnea, fever, leukocytosis and source of infection Culture sent.  Currently on broad-spectrum IV antibiotics Lactate level normal.  Hemodynamically stable. CT chest is also concerning for cavitary pneumonia or possible early abscess.   Pulmonary consultation obtained from Dr. Lake Bells.  SLP eval  requested. Continue to monitor. Recent Labs  Lab 06/14/22 1618 06/14/22 1624 06/14/22 1940 06/15/22 0133 06/16/22 0826  WBC  --  16.5*  --  17.2* 12.4*  LATICACIDVEN 1.2  --   --  1.2  --   PROCALCITON  --   --  0.38  --   --     Acute metabolic encephalopathy Altered mental status at presentation likely because of pneumonia.  May have underlying dementia as well given advanced age and vascular risk factors. Continue to monitor mental status change. PTA, patient seems to be on Seroquel 25 to 50 mg nightly as needed.  Currently on hold.   Type 2 diabetes mellitus uncontrolled with hyperglycemia A1c significantly elevated in 2022.  Repeat A1c PTA on metformin 1000 mg twice daily Currently on Levemir 5 units daily and sliding scale insulin with Accu-Cheks.  Continue to monitor. Recent Labs  Lab 06/15/22 0854 06/15/22 1240 06/15/22 1709 06/15/22 2121 06/16/22 0618  GLUCAP 208* 148* 227* 101* 143*    Essential hypertension PTA on lisinopril 10 mg daily. Currently on hold.  Blood pressure stable.  Continue to monitor blood pressure.  Ulcerative colitis immunocompromised status d/t medication Mesalamine ??? on hold   Uncontrolled hypothyroidism Patient reports compliance to Synthroid 175 mcg daily at home.  However her TSH level is significantly elevated at 38 with low free T4 level.   Not sure if she was reliably taking Synthroid at home.  Part of her lethargy could be due to hypothyroidism.  For now I would continue the same high dose of Synthroid at 175 mcg daily.. Needs to repeat TFT next few weeks and Synthroid adjustment under supervision of PCP. Recent Labs    06/15/22  0133  TSH 38.398*    Esophagitis CT scan showed diffuse esophageal wall thickening worrisome for esophagitis.  HLD Statin   Mobility: Encourage ambulation.  PT eval pending  Goals of care   Code Status: DNR  2/27, I called and discussed with patient's son and his spouse this afternoon.   They stated that she has a DNR.  I updated the order in epic   DVT prophylaxis:  enoxaparin (LOVENOX) injection 40 mg Start: 06/15/22 0800   Antimicrobials: Broad-spectrum antibiotics Fluid: NS at 75 mill per hour.  Continue for today Consultants: pulm Family Communication: Called and updated patient's son and spouse  Status is: Inpatient Level of care: Progressive   Dispo: Patient is from: Home              Anticipated d/c is to: Pending clinical course Continue in-hospital care because: On IV antibiotics.  Pending speech eval and PT eval   Scheduled Meds:  enoxaparin (LOVENOX) injection  40 mg Subcutaneous Q24H   insulin aspart  0-15 Units Subcutaneous TID WC   insulin aspart  0-5 Units Subcutaneous QHS   insulin detemir  5 Units Subcutaneous Daily   levothyroxine  175 mcg Oral Q0600    PRN meds: albuterol, guaiFENesin   Infusions:   sodium chloride 75 mL/hr at 06/16/22 0930   azithromycin Stopped (06/15/22 1646)   cefTRIAXone (ROCEPHIN)  IV Stopped (06/15/22 1925)    Diet:  Diet Order             Diet heart healthy/carb modified Room service appropriate? Yes; Fluid consistency: Thin  Diet effective now                   Antimicrobials: Anti-infectives (From admission, onward)    Start     Dose/Rate Route Frequency Ordered Stop   06/16/22 1000  vancomycin (VANCOCIN) IVPB 1000 mg/200 mL premix  Status:  Discontinued        1,000 mg 200 mL/hr over 60 Minutes Intravenous Every 36 hours 06/14/22 2132 06/16/22 0809   06/15/22 1900  cefTRIAXone (ROCEPHIN) 2 g in sodium chloride 0.9 % 100 mL IVPB        2 g 200 mL/hr over 30 Minutes Intravenous Every 24 hours 06/14/22 2218     06/14/22 2145  vancomycin (VANCOREADY) IVPB 1500 mg/300 mL        1,500 mg 150 mL/hr over 120 Minutes Intravenous  Once 06/14/22 2132 06/14/22 2352   06/14/22 2130  metroNIDAZOLE (FLAGYL) IVPB 500 mg  Status:  Discontinued        500 mg 100 mL/hr over 60 Minutes Intravenous Every 12  hours 06/14/22 2122 06/16/22 0809   06/14/22 1630  azithromycin (ZITHROMAX) 500 mg in sodium chloride 0.9 % 250 mL IVPB        500 mg 250 mL/hr over 60 Minutes Intravenous Every 24 hours 06/14/22 1627     06/14/22 1630  cefTRIAXone (ROCEPHIN) 2 g in sodium chloride 0.9 % 100 mL IVPB        2 g 200 mL/hr over 30 Minutes Intravenous  Once 06/14/22 1628 06/14/22 1747       Skin assessment:  Pressure Injury 06/15/22 Sacrum Mid Stage 1 -  Intact skin with non-blanchable redness of a localized area usually over a bony prominence. pink not blanching spot on sacrum (Active)  06/15/22 1820  Location: Sacrum  Location Orientation: Mid  Staging: Stage 1 -  Intact skin with non-blanchable redness of a localized area usually over  a bony prominence.  Wound Description (Comments): pink not blanching spot on sacrum  Present on Admission: Yes      Nutritional status:  There is no height or weight on file to calculate BMI.          Objective: Vitals:   06/16/22 0900 06/16/22 1002  BP: (!) 125/56 119/69  Pulse: 95 99  Resp: (!) 24 (!) 21  Temp:    SpO2: 91% 92%    Intake/Output Summary (Last 24 hours) at 06/16/2022 1030 Last data filed at 06/16/2022 0803 Gross per 24 hour  Intake 1792.28 ml  Output 700 ml  Net 1092.28 ml    There were no vitals filed for this visit. Weight change:  There is no height or weight on file to calculate BMI.   Physical Exam: General exam: Pleasant, elderly female.  Propped up in bed.  Skin: No rashes, lesions or ulcers. HEENT: Atraumatic, normocephalic, no obvious bleeding Lungs: Clear to auscultation bilaterally CVS: Regular rate and rhythm, no murmur GI/Abd soft, nontender, nondistended, bowel sound present CNS: Alert, awake, oriented to place Psychiatry: Sad affect Extremities: No pedal edema, no calf tenderness.   Data Review: I have personally reviewed the laboratory data and studies available.  F/u labs ordered Unresulted Labs (From  admission, onward)     Start     Ordered   06/21/22 0500  Creatinine, serum  (enoxaparin (LOVENOX)    CrCl >/= 30 ml/min)  Weekly,   R     Comments: while on enoxaparin therapy    06/14/22 2214   06/17/22 0500  CBC with Differential/Platelet  Daily,   R     Question:  Specimen collection method  Answer:  Lab=Lab collect   06/16/22 0807   06/17/22 [id number]  Basic metabolic panel  Daily,   R     Question:  Specimen collection method  Answer:  Lab=Lab collect   06/16/22 0807   06/14/22 2215  Legionella Pneumophila Serogp 1 Ur Ag  Once,   AD        06/14/22 2214            Total time spent in review of labs and imaging, patient evaluation, formulation of plan, documentation and communication with family: 107 minutes  Signed, Terrilee Croak, MD [facility] 06/16/2022

## 2022-06-16 NOTE — Progress Notes (Signed)
   Durable Medical Equipment (From admission, onward)        Start     Ordered  06/16/22 1615  For home use only DME Hospital bed  Once      Question Answer Comment Length of Need Lifetime  Patient has (list medical condition): DM2, HTN, HLD, hypothyroidism, anxiety/depression, chronic anemia, degenerative joint disease,  pna  The above medical condition requires: Patient requires the ability to reposition frequently  Head must be elevated greater than: 30 degrees  Bed type Semi-electric  Support Surface: Gel Overlay    06/16/22 1616

## 2022-06-16 NOTE — Progress Notes (Signed)
Pt has not voided since arrival to floor.  Bladder scan done, shows 390cc in bladder.  I & O cath done using sterile technique with myself and A. Dillard, RN.  700 cc amber urine received.  Pt tolerated procedure well.

## 2022-06-16 NOTE — TOC Initial Note (Signed)
Transition of Care [facility]) - Initial/Assessment Note    Patient Details  Name: Leslie Benton MRN: [id number] Date of Birth: [birth year]  Transition of [facility]) CM/SW Contact:    Bjorn Pippin, LCSW Phone Number: 06/16/2022, 2:38 PM  Clinical Narrative:     CSW met with pt at bedside along side caregiver in the room and daughter [facility]) on the phone to discuss dc to SNF. Pt is refusing SNF and stated she wants to go home. Pt was encouraged by caregiver and daughter to dc to STR at SNF, but still refused. Pt reported that she has previously gone to SNF and did not have a good experience. Pt currently has a caregiver with the home health agency [facility], whose name is Raquel Sarna. Family is agreeable to having [place] PT come to pt home and requested the agency [facility].   Daughter communicated with CSW that she is the [facility] and would like for medical updates to be communicated with her. CSW informed MD of daughter request.   TOC will continue to follow this admission.    Barriers to Discharge: Continued Medical Work up   Patient Goals and CMS Choice Patient states their goals for this hospitalization and ongoing recovery are:: To return home and have PT come there.          Expected Discharge Plan and Services                                              Prior Living Arrangements/Services                       Activities of Daily Living      Permission Sought/Granted                  Emotional Assessment              Admission diagnosis:  Multifocal pneumonia [J18.9] Patient Active Problem List   Diagnosis Date Noted   Multifocal pneumonia 06/14/2022   Anxiety and depression 06/14/2022   History of artificial joint 12/05/2020   Degeneration of intervertebral disc of lumbar region 12/05/2020   Xerosis cutis 04/30/2019   Hospital discharge follow-up 01/18/2019   SVT (supraventricular tachycardia)    Elevated troponin 01/06/2019   Diarrhea  01/05/2019   Non-compliant behavior 12/20/2018   Need for influenza vaccination 12/20/2018   Post-nasal drip 07/24/2018   Anxiety 07/04/2018   Uncontrolled type 2 diabetes mellitus with hyperglycemia ([place]) 06/06/2018   Chronic fatigue 01/30/2018   Dyslipidemia 10/17/2017   Acute eczematoid otitis externa of left ear 07/26/2017   Fungal dermatitis 07/26/2017   Cervical spondylosis without myelopathy 05/29/2015   Cervicogenic headache 05/29/2015   Obesity 01/14/2015   OA (osteoarthritis) of knee 09/23/2014   Precordial chest pain 08/08/2014   Tachycardia    Anemia 07/02/2014   Depression 06/08/2012   KNEE PAIN, RIGHT 07/15/2008   COLONIC POLYPS, HX OF 02/07/2008   LOW BACK PAIN 08/04/2007   Hypothyroidism 10/14/2006   Essential hypertension 10/14/2006   GERD 10/14/2006   PCP:  [name], MD Pharmacy:   [facility] [place], [address] AT [address] [address] Phone: [phone] Fax: [place], [address] AT [facility]  [place] [address] Phone: [phone] Fax: [phone]     Social Determinants of Health (SDOH) Social History: SDOH Screenings   Tobacco Use: Low Risk  (06/14/2022)   SDOH Interventions:     Readmission Risk Interventions     No data to display         Beckey Rutter, MSW, LCSWA, LCASA Transitions of Care  Clinical Social Worker I

## 2022-06-16 NOTE — TOC Initial Note (Signed)
Transition of Care [facility]) - Initial/Assessment Note    Patient Details  Name: Leslie Benton MRN: [id number] Date of Birth: [birth year]  [facility]) CM/SW Contact:    Cyndi Bender, RN Phone Number: 06/16/2022, 3:35 PM  Clinical Narrative:                 Spoke to daughter, Lupita Dawn, regarding transition needs.  Roxanne requesting hospital bed. Notified Kim with [facility] of order.  Roxanne would like to use [facility] for home health needs.  Amy with [facility] accepted referral.  Patient has all other DME needs.  [facility] can transport home.  Patient has caregivers 01-1799. Son and spouse live with patient. TOC will continue to follow.    Expected Discharge Plan: [place] Barriers to Discharge: Continued Medical Work up   Patient Goals and CMS Choice Patient states their goals for this hospitalization and ongoing recovery are:: return home CMS Medicare.gov Compare Post Acute Care list provided to:: Patient Represenative (must comment) Choice offered to / list presented to : Adult Children      Expected Discharge Plan and Services     Post Acute Care Choice: Home Health, Durable Medical Equipment Living arrangements for the past 2 months: [place]                 DME Arranged: Hospital bed DME Agency: [facility] Date DME Agency Contacted: 06/16/22 Time DME Agency Contacted: [phone] Representative spoke with at DME Agency: [place]: PT [place]: [place] Date [place]: 06/16/22 Time [place]: 52 Representative spoke with at [place]: [place] Arrangements/Services Living arrangements for the past 2 months: [place] with:: Spouse, Adult Children Patient language and need for interpreter reviewed:: Yes Do you feel safe going back to the place where you live?: Yes      Need for Family Participation in Patient Care: Yes (Comment) Care giver support system in place?:  Yes (comment) Current home services: DME (walker, wheelchair) Criminal Activity/Legal Involvement Pertinent to Current Situation/Hospitalization: No - Comment as needed  Activities of Daily Living      Permission Sought/Granted         Permission granted to share info w AGENCY: HH        Emotional Assessment Appearance:: Appears stated age Attitude/Demeanor/Rapport: Gracious Affect (typically observed): Accepting Orientation: : Oriented to Self, Oriented to Place, Oriented to  Time, Oriented to Situation Alcohol / Substance Use: Not Applicable Psych Involvement: No (comment)  Admission diagnosis:  Multifocal pneumonia [J18.9] Patient Active Problem List   Diagnosis Date Noted   Multifocal pneumonia 06/14/2022   Anxiety and depression 06/14/2022   History of artificial joint 12/05/2020   Degeneration of intervertebral disc of lumbar region 12/05/2020   Xerosis cutis 04/30/2019   Hospital discharge follow-up 01/18/2019   SVT (supraventricular tachycardia)    Elevated troponin 01/06/2019   Diarrhea 01/05/2019   Non-compliant behavior 12/20/2018   Need for influenza vaccination 12/20/2018   Post-nasal drip 07/24/2018   Anxiety 07/04/2018   Uncontrolled type 2 diabetes mellitus with hyperglycemia ([place]) 06/06/2018   Chronic fatigue 01/30/2018   Dyslipidemia 10/17/2017   Acute eczematoid otitis externa of left ear 07/26/2017   Fungal dermatitis 07/26/2017   Cervical spondylosis without myelopathy 05/29/2015   Cervicogenic headache 05/29/2015   Obesity 01/14/2015   OA (osteoarthritis) of knee 09/23/2014   Precordial chest pain 08/08/2014   Tachycardia    Anemia 07/02/2014  Depression 06/08/2012   KNEE PAIN, RIGHT 07/15/2008   COLONIC POLYPS, HX OF 02/07/2008   LOW BACK PAIN 08/04/2007   Hypothyroidism 10/14/2006   Essential hypertension 10/14/2006   GERD 10/14/2006   PCP:  [name], MD Pharmacy:   [facility] [place], [place] - [address] AT [address] [place] [address] Phone: [phone] Fax: [place] [place] [address] Phone: [phone] Fax: [phone]     Social Determinants of Health (SDOH) Social History: SDOH Screenings   Tobacco Use: Low Risk  (06/14/2022)   SDOH Interventions:     Readmission Risk Interventions     No data to display

## 2022-06-17 ENCOUNTER — Telehealth: Payer: Self-pay

## 2022-06-17 DIAGNOSIS — J189 Pneumonia, unspecified organism: Secondary | ICD-10-CM | POA: Diagnosis not present

## 2022-06-17 LAB — CBC WITH DIFFERENTIAL/PLATELET
Abs Immature Granulocytes: 0.4 10*3/uL — ABNORMAL HIGH (ref 0.00–0.07)
Basophils Absolute: 0.1 10*3/uL (ref 0.0–0.1)
Basophils Relative: 1 %
Eosinophils Absolute: 0 10*3/uL (ref 0.0–0.5)
Eosinophils Relative: 0 %
HCT: 29.4 % — ABNORMAL LOW (ref 36.0–46.0)
Hemoglobin: 9.3 g/dL — ABNORMAL LOW (ref 12.0–15.0)
Lymphocytes Relative: 16 %
Lymphs Abs: 1.5 10*3/uL (ref 0.7–4.0)
MCH: 28.1 pg (ref 26.0–34.0)
MCHC: 31.6 g/dL (ref 30.0–36.0)
MCV: 88.8 fL (ref 80.0–100.0)
Metamyelocytes Relative: 3 %
Monocytes Absolute: 0.4 10*3/uL (ref 0.1–1.0)
Monocytes Relative: 4 %
Myelocytes: 1 %
Neutro Abs: 7.2 10*3/uL (ref 1.7–7.7)
Neutrophils Relative %: 75 %
Platelets: 314 10*3/uL (ref 150–400)
RBC: 3.31 MIL/uL — ABNORMAL LOW (ref 3.87–5.11)
RDW: 15.9 % — ABNORMAL HIGH (ref 11.5–15.5)
WBC: 9.6 10*3/uL (ref 4.0–10.5)
nRBC: 0 % (ref 0.0–0.2)
nRBC: 0 /100 WBC

## 2022-06-17 LAB — GLUCOSE, CAPILLARY
Glucose-Capillary: 168 mg/dL — ABNORMAL HIGH (ref 70–99)
Glucose-Capillary: 224 mg/dL — ABNORMAL HIGH (ref 70–99)
Glucose-Capillary: 130 mg/dL — ABNORMAL HIGH (ref 70–99)

## 2022-06-17 LAB — BASIC METABOLIC PANEL
Anion gap: 6 (ref 5–15)
BUN: 16 mg/dL (ref 8–23)
CO2: 23 mmol/L (ref 22–32)
Calcium: 7.8 mg/dL — ABNORMAL LOW (ref 8.9–10.3)
Chloride: 105 mmol/L (ref 98–111)
Creatinine, Ser: 1.15 mg/dL — ABNORMAL HIGH (ref 0.44–1.00)
GFR, Estimated: 47 mL/min — ABNORMAL LOW (ref >60)
Glucose, Bld: 187 mg/dL — ABNORMAL HIGH (ref 70–99)
Potassium: 4 mmol/L (ref 3.5–5.1)
Sodium: 134 mmol/L — ABNORMAL LOW (ref 135–145)

## 2022-06-17 LAB — LEGIONELLA PNEUMOPHILA SEROGP 1 UR AG: L. pneumophila Serogp 1 Ur Ag: NEGATIVE

## 2022-06-17 MED ORDER — SACCHAROMYCES BOULARDII 250 MG PO CAPS: 250.0000 mg | ORAL_CAPSULE | Freq: Two times a day (BID) | ORAL | 0 refills | Status: AC

## 2022-06-17 MED ORDER — CEFADROXIL 500 MG PO CAPS: 500.0000 mg | ORAL_CAPSULE | Freq: Two times a day (BID) | ORAL | 0 refills | Status: AC

## 2022-06-17 MED ORDER — GUAIFENESIN 100 MG/5ML PO LIQD: 5.0000 mL | ORAL | 0 refills | Status: AC | PRN

## 2022-06-17 NOTE — Care Management Important Message (Signed)
Important Message  Patient Details  Name: Leslie Benton MRN: [id number] Date of Birth: Aug 30, 1940   Medicare Important Message Given:  Yes     Orbie Pyo 06/17/2022, 2:52 PM

## 2022-06-17 NOTE — TOC Transition Note (Addendum)
Transition of Care [facility]) - CM/SW Discharge Note   Patient Details  Name: Leslie Benton MRN: [id number] Date of Birth: 23-Jan-1941  Transition of Care [facility]) CM/SW Contact:  Verdell Carmine, RN Phone Number: 06/17/2022, 11:00 AM   Clinical Narrative:    Daughter Roxanne called, wanted to know about equipment as the patient is DC today. Called [facility], Bed will be delivered today. they will call 30 minutes prior to delivery. Called Lupita Dawn ( daughter)  back and left a message on her phone.  1250 Follow up with Daughter Roxanne. She stated that [facility] would bring equipment at 3pm. Her brother will come to pick the patient up in wheelchair Lucianne Lei ( with Wheelchair) at 42p RN aware.  Amy from [facility] aware of patient discharge  Final next level of care: [place] Barriers to Discharge: Continued Medical Work up   Patient Goals and CMS Choice CMS Medicare.gov Compare Post Acute Care list provided to:: Patient Represenative (must comment) Choice offered to / list presented to : Adult Children  Discharge Placement                         Discharge Plan and Services Additional resources added to the After Visit Summary for       Post Acute Care Choice: Home Health, Durable Medical Equipment          DME Arranged: Hospital bed DME Agency: [facility] Date DME Agency Contacted: 06/16/22 Time DME Agency Contacted: [phone] Representative spoke with at DME Agency: [place]: PT [place]: [place] Date [place]: 06/16/22 Time [place]: [id number] Representative spoke with at [place]: Amy  Social Determinants of Health ([place]) Interventions SDOH Screenings   Tobacco Use: Low Risk  (06/14/2022)     Readmission Risk Interventions     No data to display

## 2022-06-17 NOTE — Discharge Summary (Addendum)
Physician Discharge Summary  [name] [id number] DOB: Feb 18, 1941 DOA: 06/14/2022  PCP: [name], MD  Admit date: 06/14/2022 Discharge date: 06/17/2022  Admitted From: Home Discharge disposition: Home with home and PT and DME  Recommendations at discharge:  Complete 5 more days of antibiotics with probiotics. Ensure compliance with the medications including Synthroid. Follow-up with urology as an outpatient for voiding trial. Follow-up with your primary care provider in few weeks for repeat thyroid function test   Brief narrative: Leslie Benton is a 82 y.o. female with PMH significant for DM2, HTN, HLD, hypothyroidism, anxiety/depression, chronic anemia, degenerative joint disease. 2/26, patient was brought to the ED from home with complaint of cough for 1-2 weeks and an episode of gasping for air.  In the ED, temperature 100.5, heart rate 100s, blood pressure 142/90, required 2 to 3 L oxygen by nasal cannula. Labs with WC count elevated to 16.5, lactic acid level normal, sodium 131, glucose 228, troponin elevated to 40 Respiratorye virus panel negative for influenza, COVID CT angio of chest was negative for pulm embolism.  But it showed 3 rounded masslike opacities bilaterally. Two areas are hypodense centrally with 1 area containing central air bubbles. Findings are worrisome for multifocal pneumonia including cavitary pneumonias or possible early abscesses. Neoplasm with necrosis can not be excluded.  Blood culture sent. Patient was started on IV Rocephin, vancomycin, azithromycin and Flagyl. Admitted to [facility] Pulmonary consultation was obtained.  Subjective: Patient was seen and examined this morning.  Lying on bed.  More awake, alert and conversational than yesterday.  Not in pain.  Slept well last night Family not at bedside.  Hospital course: Sepsis - POA multifocal pneumonia, concern for abscess or malignancy Presented with cough, fatigue for 1 to  2 weeks and an episode of shortness of breath Met sepsis criteria with tachycardia, tachypnea, fever, leukocytosis and source of infection Blood culture did not show any growth.  Clinically improving with IV Rocephin and azithromycin.   CT chest on admission was also concerning for cavitary pneumonia or possible early abscess.  Pulmonary consultation was obtained Dr. Lake Bells.  Did not require bronchoscopy.   SLP eval was obtained which did not show any evidence of aspiration. Clinically improving. Will discharge her on 5 more days of oral Duricef with probiotics Recent Labs  Lab 06/14/22 1618 06/14/22 1624 06/14/22 1940 06/15/22 0133 06/16/22 0826 06/17/22 0013  WBC  --  16.5*  --  17.2* 12.4* 9.6  LATICACIDVEN 1.2  --   --  1.2  --   --   PROCALCITON  --   --  0.38  --   --   --    Acute metabolic encephalopathy Altered mental status at presentation likely because of pneumonia.  May have underlying dementia as well given advanced age and vascular risk factors. Continue to monitor mental status change. PTA, patient seems to be on Seroquel 25 to 50 mg nightly as needed.  Continue the same at home  Acute urinary retention Foley catheter was inserted in the ED. Will be discharged with Foley catheter for voiding trial as an outpatient at urologist office.  Type 2 diabetes mellitus uncontrolled with hyperglycemia A1c 9.4 on 06/15/2022.   PTA on metformin 1000 mg twice daily.  Continue the same.  Essential hypertension PTA on lisinopril 10 mg daily. Continue the same at home.  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  Patient states he is still taking it at home.  Follow-up with GI as  an outpatient   Uncontrolled hypothyroidism Patient reports compliance to Synthroid 175 mcg daily at home.  However her TSH level is significantly elevated at 38 with low free T4 level.   Not sure if she was reliably taking Synthroid at home.  Part of her lethargy could  be due to hypothyroidism.  For now I would continue the same high dose of Synthroid at 175 mcg daily.  I have spoken to patient and her daughter about it. Needs to repeat TFT next few weeks and Synthroid adjustment under supervision of PCP. Recent Labs    06/15/22 0133  TSH 38.398*   Esophagitis CT scan showed diffuse esophageal wall thickening worrisome for esophagitis. Not symptomatic.  Not on PPI or antacid at home.  HLD Statin  Impaired mobility PT eval obtained.  SNF was recommended but patient and family refuse SNF and want a discharge to home.  Home with PT and DME ordered.  Goals of care   Code Status: DNR   Wounds:  - Pressure Injury 06/15/22 Sacrum Mid Stage 1 -  Intact skin with non-blanchable redness of a localized area usually over a bony prominence. pink not blanching spot on sacrum (Active)  Date First Assessed/Time First Assessed: 06/15/22 1820   Location: Sacrum  Location Orientation: Mid  Staging: Stage 1 -  Intact skin with non-blanchable redness of a localized area usually over a bony prominence.  Wound Description (Comments): pink n...    Assessments 06/15/2022  6:20 PM 06/17/2022  8:00 AM  Dressing Type Foam - Lift dressing to assess site every shift Foam - Lift dressing to assess site every shift  Dressing Clean, Dry, Intact Clean, Dry, Intact  Dressing Change Frequency Daily --  Site / Wound Assessment Pink;Clean Clean;Dry;Pink  Margins Attached edges (approximated) --  Drainage Amount None None  Treatment Cleansed --     No associated orders.    Discharge Exam:   Vitals:   06/17/22 0745 06/17/22 0800 06/17/22 0900 06/17/22 1122  BP: 139/75 137/77 (!) 169/75 (!) 156/87  Pulse: 98 94 88 89  Resp: (!) 23 (!) 26 (!) 22 (!) 24  Temp: 97.8 F (36.6 C)   98 F (36.7 C)  TempSrc: Oral   Oral  SpO2: 95% 94% 90% 95%    There is no height or weight on file to calculate BMI.   General exam: Pleasant, elderly female.  Propped up in bed.  Skin: No  rashes, lesions or ulcers. HEENT: Atraumatic, normocephalic, no obvious bleeding Lungs: Clear to auscultation bilaterally CVS: Regular rate and rhythm, no murmur GI/Abd soft, nontender, nondistended, bowel sound present CNS: Alert, awake, oriented to place Psychiatry: Mood appropriate.  Happy to go home today. Extremities: No pedal edema, no calf tenderness.  Follow ups:    Follow-up Information     [name], MD Follow up.   Specialty: Family Medicine Contact information: [place] [address] [place] Follow up.   Why: They will call you 24-48 hours after DC to set up assessment at home.        [facility] Follow up.   Why: Your Air traffic controller information: [id number] [place] [address] [phone]                 Discharge Instructions:   Discharge Instructions     Call MD for:  difficulty breathing, headache or visual disturbances  Complete by: As directed    Call MD for:  extreme fatigue   Complete by: As directed    Call MD for:  hives   Complete by: As directed    Call MD for:  persistant dizziness or light-headedness   Complete by: As directed    Call MD for:  persistant nausea and vomiting   Complete by: As directed    Call MD for:  severe uncontrolled pain   Complete by: As directed    Call MD for:  temperature >100.4   Complete by: As directed    Diet Carb Modified   Complete by: As directed    Discharge instructions   Complete by: As directed    Recommendations at discharge:   Complete 5 more days of antibiotics with probiotics.  Ensure compliance with the medications including Synthroid.  Follow-up with urology as an outpatient for voiding trial.  Follow-up with your primary care provider in few weeks for repeat thyroid function test  General discharge instructions: Follow with Primary MD [name],  MD in 7 days  Please request your PCP  to go over your hospital tests, procedures, radiology results at the follow up. Please get your medicines reviewed and adjusted.  Your PCP may decide to repeat certain labs or tests as needed. Do not drive, operate heavy machinery, perform activities at heights, swimming or participation in water activities or provide baby sitting services if your were admitted for syncope or siezures until you have seen by Primary MD or a Neurologist and advised to do so again. [place] Controlled Substance Reporting System database was reviewed. Do not drive, operate heavy machinery, perform activities at heights, swim, participate in water activities or provide baby-sitting services while on medications for pain, sleep and mood until your outpatient physician has reevaluated you and advised to do so again.  You are strongly recommended to comply with the dose, frequency and duration of prescribed medications. Activity: As tolerated with Full fall precautions use walker/cane & assistance as needed Avoid using any recreational substances like cigarette, tobacco, alcohol, or non-prescribed drug. If you experience worsening of your admission symptoms, develop shortness of breath, life threatening emergency, suicidal or homicidal thoughts you must seek medical attention immediately by calling 911 or calling your MD immediately  if symptoms less severe. You must read complete instructions/literature along with all the possible adverse reactions/side effects for all the medicines you take and that have been prescribed to you. Take any new medicine only after you have completely understood and accepted all the possible adverse reactions/side effects.  Wear Seat belts while driving. You were cared for by a hospitalist during your hospital stay. If you have any questions about your discharge medications or the care you received while you were in the hospital after you are discharged,  you can call the unit and ask to speak with the hospitalist or the covering physician. Once you are discharged, your primary care physician will handle any further medical issues. Please note that NO REFILLS for any discharge medications will be authorized once you are discharged, as it is imperative that you return to your primary care physician (or establish a relationship with a primary care physician if you do not have one).   Discharge wound care:   Complete by: As directed    Increase activity slowly   Complete by: As directed        Discharge Medications:   Allergies as of 06/17/2022  Reactions   Niacin And Related Hives, Swelling   No anaphalaxis, but strong reactions.   Lactose Intolerance (gi)    "bad taste in mouth"   Amoxicillin Swelling   hives   Penicillins Swelling   Hives With all cillins   Tilactase         Medication List     STOP taking these medications    ibuprofen 200 MG tablet Commonly known as: ADVIL       TAKE these medications    atorvastatin 10 MG tablet Commonly known as: LIPITOR Take 10 mg by mouth daily.   cefadroxil 500 MG capsule Commonly known as: DURICEF Take 1 capsule (500 mg total) by mouth 2 (two) times daily for 5 days.   CRANBERRY PO Take by mouth.   guaiFENesin 100 MG/5ML liquid Commonly known as: ROBITUSSIN Take 5 mLs by mouth every 4 (four) hours as needed for cough or to loosen phlegm.   levothyroxine 175 MCG tablet Commonly known as: SYNTHROID TAKE 1 TABLET(175 MCG) BY MOUTH DAILY BEFORE BREAKFAST What changed: See the new instructions.   lisinopril 10 MG tablet Commonly known as: ZESTRIL TAKE 1 TABLET(10 MG) BY MOUTH DAILY What changed: See the new instructions.   loratadine 10 MG tablet Commonly known as: CLARITIN Take 10 mg by mouth daily as needed for allergies or rhinitis.   MELATONIN PO Take 1 tablet by mouth at bedtime as needed.   mesalamine 1000 MG suppository Commonly known as:  CANASA Place 1 suppository (1,000 mg total) rectally 2 (two) times daily.   mesalamine 250 MG CR capsule Commonly known as: PENTASA Take 4 capsules (1,000 mg total) by mouth 4 (four) times daily.   metFORMIN 1000 MG tablet Commonly known as: GLUCOPHAGE Take 1 tablet (1,000 mg total) by mouth 2 (two) times daily with a meal.   metoprolol tartrate 25 MG tablet Commonly known as: LOPRESSOR Take 1 tablet (25 mg total) by mouth 4 (four) times daily.   multivitamin with minerals Tabs tablet Take 1 tablet by mouth daily.   QUEtiapine 25 MG tablet Commonly known as: SEROQUEL TAKE 1 TO 2 TABLETS BY MOUTH AT NIGHT AS NEEDED   saccharomyces boulardii 250 MG capsule Commonly known as: FLORASTOR Take 1 capsule (250 mg total) by mouth 2 (two) times daily for 5 days.               Durable Medical Equipment  (From admission, onward)           Start     Ordered   06/16/22 1615  For home use only DME Hospital bed  Once       Question Answer Comment  Length of Need Lifetime   Patient has (list medical condition): DM2, HTN, HLD, hypothyroidism, anxiety/depression, chronic anemia, degenerative joint disease,  pna   The above medical condition requires: Patient requires the ability to reposition frequently   Head must be elevated greater than: 30 degrees   Bed type Semi-electric   Support Surface: Gel Overlay      06/16/22 1616              Discharge Care Instructions  (From admission, onward)           Start     Ordered   06/17/22 0000  Discharge wound care:        06/17/22 1136             The results of significant diagnostics from this hospitalization (including imaging, microbiology, ancillary  and laboratory) are listed below for reference.    Procedures and Diagnostic Studies:   CT Head Wo Contrast  Result Date: 06/14/2022 CLINICAL DATA:  Altered mental status. EXAM: CT HEAD WITHOUT CONTRAST TECHNIQUE: Contiguous axial images were obtained from the  base of the skull through the vertex without intravenous contrast. RADIATION DOSE REDUCTION: This exam was performed according to the departmental dose-optimization program which includes automated exposure control, adjustment of the mA and/or kV according to patient size and/or use of iterative reconstruction technique. COMPARISON:  February 09, 2019 FINDINGS: Brain: There is mild cerebral atrophy with widening of the extra-axial spaces and ventricular dilatation. There are areas of decreased attenuation within the white matter tracts of the supratentorial brain, consistent with microvascular disease changes. 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 Vascular: No hyperdense vessel or unexpected calcification. Skull: Normal. Negative for fracture or focal lesion. Sinuses/Orbits: No acute finding. Other: None. IMPRESSION: 1. No acute intracranial abnormality. 2. Generalized cerebral atrophy and microvascular disease changes of the supratentorial brain. 3. 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 Electronically Signed   By: Virgina Norfolk M.D.   On: 06/14/2022 20:52   CT Angio Chest PE W and/or Wo Contrast  Result Date: 06/14/2022 CLINICAL DATA:  High probability for PE.  Cough. EXAM: CT ANGIOGRAPHY CHEST WITH CONTRAST TECHNIQUE: Multidetector CT imaging of the chest was performed using the standard protocol during bolus administration of intravenous contrast. Multiplanar CT image reconstructions and MIPs were obtained to evaluate the vascular anatomy. RADIATION DOSE REDUCTION: This exam was performed according to the departmental dose-optimization program which includes automated exposure control, adjustment of the mA and/or kV according to patient size and/or use of iterative reconstruction technique. CONTRAST:  81m OMNIPAQUE IOHEXOL 350 MG/ML SOLN COMPARISON:  CT angiogram chest 01/06/2019 FINDINGS: Cardiovascular: Satisfactory opacification of the pulmonary arteries to  the segmental level. No evidence of pulmonary embolism. Normal heart size. No pericardial effusion. There are atherosclerotic calcifications of the aorta. Mediastinum/Nodes: There is diffuse esophageal wall thickening. There is a small hiatal hernia. No enlarged lymph nodes are seen. The visualized esophagus is within normal limits. Lungs/Pleura: There is a focal rounded airspace opacity this is which is masslike in configuration abutting the pleura in the left upper lobe measuring 4.4 x 2.8 by 4.0 cm. There is a second rounded masslike density in the right lower lobe abutting the pleura measuring 3.9 by 3.8 by 3.7 cm image 6/95. This area has a mildly hypodense region centrally. Third masslike area is seen in the superior segment of the right lower lobe abutting the pleura measuring 6.4 x 3.4 5.7 cm. This is hypodense centrally with a few small bubbles of air seen in the nondependent portion. There is ground-glass opacity and smooth interlobular septal thickening surrounding these 3 regions. There are small pleural effusions, right greater than left with compressive atelectasis in the bilateral lower lobes. No pneumothorax visualized. Upper Abdomen: No acute abnormality. Musculoskeletal: Degenerative changes affect the spine. No acute fracture or focal osseous lesion. Review of the MIP images confirms the above findings. IMPRESSION: 1. No evidence for pulmonary embolism. 2. There are 3 rounded masslike airspace opacities seen bilaterally. Two areas are hypodense centrally with 1 area containing central air bubbles. Findings are worrisome for multifocal pneumonia including cavitary pneumonias or possible early abscesses. Neoplasm with necrosis can not be excluded. 3. Small bilateral pleural effusions with compressive atelectasis in the lower lobes. 4. Diffuse esophageal wall thickening worrisome for esophagitis. Aortic Atherosclerosis (ICD10-I70.0). Electronically Signed  By: Ronney Asters M.D.   On: 06/14/2022  19:30   DG Chest 2 View  Result Date: 06/14/2022 CLINICAL DATA:  Cough. EXAM: CHEST - 2 VIEW COMPARISON:  September 29, 2013. FINDINGS: Stable cardiomediastinal silhouette. Small bilateral pleural effusions are noted. Lobular density is noted laterally in right midlung which may represent pneumonia, but neoplasm cannot be excluded. Bony thorax is unremarkable. IMPRESSION: Lobular density seen laterally in right mid lung which may represent pneumonia, but CT scan cannot be excluded. CT scan of the chest is recommended for further evaluation. Small bilateral pleural effusions. Electronically Signed   By: Marijo Conception M.D.   On: 06/14/2022 15:50     Labs:   Basic Metabolic Panel: Recent Labs  Lab 06/14/22 1624 06/15/22 0133 06/16/22 0826 06/17/22 0013  NA 131* 131* 135 134*  K 4.3 4.1 3.9 4.0  CL 95* 98 103 105  CO2 24 22 19* 23  GLUCOSE 228* 228* 290* 187*  BUN '18 19 17 16  '$ CREATININE 1.09* 1.04* 1.06* 1.15*  CALCIUM 8.4* 7.9* 7.9* 7.8*  MG  --  1.7  --   --    GFR CrCl cannot be calculated (Unknown ideal weight.). Liver Function Tests: No results for input(s): "AST", "ALT", "ALKPHOS", "BILITOT", "PROT", "ALBUMIN" in the last 168 hours. No results for input(s): "LIPASE", "AMYLASE" in the last 168 hours. No results for input(s): "AMMONIA" in the last 168 hours. Coagulation profile Recent Labs  Lab 06/14/22 1618  INR 1.0    CBC: Recent Labs  Lab 06/14/22 1624 06/15/22 0133 06/16/22 0826 06/17/22 0013  WBC 16.5* 17.2* 12.4* 9.6  NEUTROABS 15.7* 14.6*  --  7.2  HGB 12.1 10.5* 10.1* 9.3*  HCT 36.5 33.4* 32.5* 29.4*  MCV 87.3 89.1 88.6 88.8  PLT 348 317 316 314   Cardiac Enzymes: No results for input(s): "CKTOTAL", "CKMB", "CKMBINDEX", "TROPONINI" in the last 168 hours. BNP: Invalid input(s): "POCBNP" CBG: Recent Labs  Lab 06/16/22 1126 06/16/22 1615 06/16/22 2100 06/17/22 0616 06/17/22 1124  GLUCAP 229* 256* 144* 168* 224*   D-Dimer No results for input(s):  "DDIMER" in the last 72 hours. Hgb A1c Recent Labs    06/15/22 0133  HGBA1C 9.4*   Lipid Profile No results for input(s): "CHOL", "HDL", "LDLCALC", "TRIG", "CHOLHDL", "LDLDIRECT" in the last 72 hours. Thyroid function studies Recent Labs    06/15/22 0133  TSH 38.398*   Anemia work up No results for input(s): "VITAMINB12", "FOLATE", "FERRITIN", "TIBC", "IRON", "RETICCTPCT" in the last 72 hours. Microbiology Recent Results (from the past 240 hour(s))  Resp panel by RT-PCR (RSV, Flu A&B, Covid) Anterior Nasal Swab     Status: None   Collection Time: 06/14/22  3:22 PM   Specimen: Anterior Nasal Swab  Result Value Ref Range Status   SARS Coronavirus 2 by RT PCR NEGATIVE NEGATIVE Final   Influenza A by PCR NEGATIVE NEGATIVE Final   Influenza B by PCR NEGATIVE NEGATIVE Final    Comment: (NOTE) The Xpert Xpress SARS-CoV-2/FLU/RSV plus assay is intended as an aid in the diagnosis of influenza from Nasopharyngeal swab specimens and should not be used as a sole basis for treatment. Nasal washings and aspirates are unacceptable for Xpert Xpress SARS-CoV-2/FLU/RSV testing.  Fact Sheet for Patients: EntrepreneurPulse.com.au  Fact Sheet for Healthcare Providers: IncredibleEmployment.be  This test is not yet approved or cleared by the [place] FDA and has been authorized for detection and/or diagnosis of SARS-CoV-2 by FDA under an Emergency Use Authorization (EUA). This EUA  will remain in effect (meaning this test can be used) for the duration of the COVID-19 declaration under Section 564(b)(1) of the Act, 21 U.S.C. section 360bbb-3(b)(1), unless the authorization is terminated or revoked.     Resp Syncytial Virus by PCR NEGATIVE NEGATIVE Final    Comment: (NOTE) Fact Sheet for Patients: EntrepreneurPulse.com.au  Fact Sheet for Healthcare Providers: IncredibleEmployment.be  This test is not yet approved  or cleared by the [place] FDA and has been authorized for detection and/or diagnosis of SARS-CoV-2 by FDA under an Emergency Use Authorization (EUA). This EUA will remain in effect (meaning this test can be used) for the duration of the COVID-19 declaration under Section 564(b)(1) of the Act, 21 U.S.C. section 360bbb-3(b)(1), unless the authorization is terminated or revoked.  Performed at [facility], [place] [address], [place], [place] [address]   Blood Culture (routine x 2)     Status: None (Preliminary result)   Collection Time: 06/14/22  4:24 PM   Specimen: BLOOD  Result Value Ref Range Status   Specimen Description BLOOD RIGHT ANTECUBITAL  Final   Special Requests   Final    BOTTLES DRAWN AEROBIC AND ANAEROBIC Blood Culture results may not be optimal due to an excessive volume of blood received in culture bottles   Culture   Final    NO GROWTH 3 DAYS Performed at [facility], [place] [address] [place], [place] [address]    Report Status PENDING  Incomplete  Blood Culture (routine x 2)     Status: None (Preliminary result)   Collection Time: 06/14/22  4:35 PM   Specimen: BLOOD RIGHT FOREARM  Result Value Ref Range Status   Specimen Description BLOOD RIGHT FOREARM  Final   Special Requests   Final    BOTTLES DRAWN AEROBIC AND ANAEROBIC Blood Culture results may not be optimal due to an excessive volume of blood received in culture bottles   Culture   Final    NO GROWTH 3 DAYS Performed at [facility], [place] [address], [place], [place] [address]    Report Status PENDING  Incomplete  MRSA Next Gen by PCR, Nasal     Status: None   Collection Time: 06/15/22  9:13 AM   Specimen: Nasal Mucosa; Nasal Swab  Result Value Ref Range Status   MRSA by PCR Next Gen NOT DETECTED NOT DETECTED Final    Comment: (NOTE) The GeneXpert MRSA Assay (FDA approved for NASAL specimens only), is one component of a comprehensive MRSA colonization surveillance program. It is  not intended to diagnose MRSA infection nor to guide or monitor treatment for MRSA infections. Test performance is not FDA approved in patients less than 73 years old. Performed at [facility], [place] [address] [place], [place] [address]   MRSA Next Gen by PCR, Nasal     Status: None   Collection Time: 06/15/22  6:50 PM   Specimen: Nasal Mucosa; Nasal Swab  Result Value Ref Range Status   MRSA by PCR Next Gen NOT DETECTED NOT DETECTED Final    Comment: (NOTE) The GeneXpert MRSA Assay (FDA approved for NASAL specimens only), is one component of a comprehensive MRSA colonization surveillance program. It is not intended to diagnose MRSA infection nor to guide or monitor treatment for MRSA infections. Test performance is not FDA approved in patients less than 19 years old. Performed at [facility], [place] [address], [place], [place] [address]     Time  coordinating discharge: 35 minutes  Signed: Meka Lewan  [facility] 06/17/2022, 1:37 PM

## 2022-06-17 NOTE — Telephone Encounter (Signed)
(  3:45 pm) PC SW scheduled an initial visit and post hospital visit with patient. Patient is scheduled with the clinical team for 06/21/22 @ 11am.

## 2022-06-18 ENCOUNTER — Telehealth: Payer: Self-pay | Admitting: *Deleted

## 2022-06-18 ENCOUNTER — Encounter: Payer: Self-pay | Admitting: *Deleted

## 2022-06-18 NOTE — Transitions of Care (Post Inpatient/ED Visit) (Signed)
06/18/2022  Name: PENLEY LEGREE MRN: [id number] DOB: 07-13-1940  Today's TOC FU Call Status: Today's TOC FU Call Status:: Successful TOC FU Call Competed (patient requested that entirety of call today be completed with her private duty care giver through [facility], "Raquel Sarna") [place] Call Complete Date: 06/18/22  Transition Care Management Follow-up Telephone Call Date of Discharge: 06/17/22 Discharge Facility: [facility]) Type of Discharge: Inpatient Admission Primary Inpatient Discharge Diagnosis:: multifocal pneumonia How have you been since you were released from the hospital?: Better (per private duty caregiver: "She is doing better, slowly but surely; we take care of all of her needs; she is never alone; she has changed PCP doctors- she now goes to a PCP at [facility]") Any questions or concerns?: No  Items Reviewed: Did you receive and understand the discharge instructions provided?: Yes (thoroughly reviewed with private duty caregiver) Medications obtained and verified?: Yes (Medications Reviewed) (confirmed patient obtained/ is taking all newly Rx'd medications post hospital discharge yesterday; son manages all aspects of medication administration) Any new allergies since your discharge?: No Dietary orders reviewed?: No Do you have support at home?: Yes People in Home: child(ren), adult Name of Support/Comfort Primary Source: resides with adult son; has private duty caregivers "24-7;" patient requires assistance with all self-care activities  Home Care and Equipment/Supplies: [place] Ordered?: Yes Name of [place]:: [facility] Has Agency set up a time to come to your home?: Yes [place] Visit Date:  (per caregiver, "they are already coming to see Mava's husband, so they will be here soon and will tell us when services are to start for Clarise Cruz") Any new equipment or medical supplies ordered?: Yes (hospital bed) Name of Medical supply agency?:  [facility] Were you able to get the equipment/medical supplies?: Yes Do you have any questions related to the use of the equipment/supplies?: No  Functional Questionnaire: Do you need assistance with bathing/showering or dressing?: Yes Do you need assistance with meal preparation?: Yes Do you need assistance with eating?: Yes Do you have difficulty maintaining continence: Yes Do you need assistance with getting out of bed/getting out of a chair/moving?: Yes Do you have difficulty managing or taking your medications?: Yes  Folllow up appointments reviewed: PCP Follow-up appointment confirmed?: No (caregiver/ patient reports that patient has new non-[facility] PCP; encouraged them to schedule hospital follow up visit promptly and have PCP updated in EHR accordingly) MD Provider Line [phone] Given: No [facility] Follow-up appointment confirmed?: No Reason Specialist Follow-Up Not Confirmed: Patient has Specialist Provider Number and will Call for Appointment Do you need transportation to your follow-up appointment?: No Do you understand care options if your condition(s) worsen?: Yes-patient verbalized understanding  SDOH Interventions Today    Flowsheet Row Most Recent Value  SDOH Interventions   Food Insecurity Interventions Intervention Not Indicated  Transportation Interventions Intervention Not Indicated  [family and private duty caregivers provide transportation]      TOC Interventions Today    Flowsheet Row Most Recent Value  TOC Interventions   TOC Interventions Discussed/Reviewed TOC Interventions Discussed, S/S of infection      Interventions Today    Flowsheet Row Most Recent Value  Chronic Disease   Chronic disease during today's visit Other  [multifocal pneumonia]  General Interventions   General Interventions Discussed/Reviewed General Interventions Discussed, Doctor Visits  Doctor Visits Discussed/Reviewed Doctor Visits Discussed, PCP, Specialist   PCP/Specialist Visits Compliance with follow-up visit  Education Interventions   Education Provided Provided Education  [employer]  foley catheter care]  Provided Verbal Education On Medication  [importance of taking antibiotic/ probiotic as prescribed]  Nutrition Interventions   Nutrition Discussed/Reviewed Nutrition Discussed  Pharmacy Interventions   Pharmacy Dicussed/Reviewed Pharmacy Topics Discussed      Oneta Rack, RN, BSN, CCRN Alumnus RN CM Care Coordination/ Transition of [place] Management [phone]: direct office

## 2022-06-19 LAB — CULTURE, BLOOD (ROUTINE X 2)
Culture: NO GROWTH
Culture: NO GROWTH

## 2022-06-21 ENCOUNTER — Encounter: Payer: Self-pay | Admitting: Hematology & Oncology

## 2022-06-21 ENCOUNTER — Other Ambulatory Visit: Payer: Medicare Other

## 2022-06-21 VITALS — BP 152/80 | HR 64 | Temp 96.5°F

## 2022-06-21 DIAGNOSIS — Z515 Encounter for palliative care: Secondary | ICD-10-CM

## 2022-06-21 NOTE — Progress Notes (Signed)
PATIENT NAME: KYNNADI WALLOCH DOB: [birth year] MRN: [id number]  PRIMARY CARE PROVIDER: [name], MD  RESPONSIBLE PARTY:  Acct ID - Guarantor Home Phone Work Phone Relationship Acct Type  [id number] OMERA, [phone]  Self P/F     [place], [place], [place] [address]    Palliative Care Initial Encounter Note   Completed home visit with Katheren Puller, [place] present.     HISTORY OF PRESENT ILLNESS:    Respiratory: no issues at this time but did have SOB just before her hospitalization  Cognitive: alert and oriented; when asked most questions she says "I don't know"; Blanch Media pt has memory problems; gave a Dementia booklet    Appetite: pt reports she doesn't eat much she states "I feast and famine"; eats soft food; eats in the bed at this time d/t immobility  GI/GU: pt reports she has colitis; has foley cath draining clear yellow fluid; no issues at this time   Mobility: pt states she tries to get out of bed to ambulate; caregiver reports she hasn't been out of the bed since before going to the hospital  ADLs: dependent; had several falls within the past month; within past 2 weeks has become bedbound   Sleeping Pattern: sleeps "pretty good last night" but she doesn't usually sleep well as per pt  Pain: denies pain at this time  Remains on ABT for pneumonia  Palliative Care/ Hospice: LPN explained role and purpose of palliative care including visit frequency.  Goals of Care: To stay in the home with her husband and son  CODE STATUS: N ADVANCED DIRECTIVES: Y MOST FORM: N PPS: 30%   PHYSICAL EXAM:   VITALS: Today's Vitals   06/21/22 1112  BP: (!) 152/80  Pulse: 64  Temp: (!) 96.5 F (35.8 C)  SpO2: 92%    LUNGS: clear to auscultation , decreased breath sounds CARDIAC: Cor RRR EXTREMITIES: normal SKIN: Skin color, texture, turgor normal. No rashes or lesions  NEURO: Dementia; weakness       Coren Crownover Georgann Housekeeper, LPN

## 2022-06-21 NOTE — Progress Notes (Signed)
COMMUNITY PALLIATIVE CARE SW NOTE  PATIENT NAME: Leslie Benton DOB: [birth year] MRN: [id number]  PRIMARY CARE PROVIDER: Corliss Blacker, MD  RESPONSIBLE PARTY:  Acct ID - Guarantor Home Phone Work Phone Relationship Acct Type  [id number] DENETRA, [phone]  Self P/F     [place], [place], [place] [address]   Initial Palliative Care Visit/Clinical Social Work  Initial  joint SW and Nurse-D. Curry palliative care visit with patient at her home. She appears to be comfortable and in no distress. She was observed in bed. Patient is alert and oriented x3, but has some short-term memory deficits. Patient is hard of hearing. Patient's sitter-Joyce provided a status update on patient.  Patient has a catheter. She denies pain. Patient likes her ceiling fan on, but uses a heating pad on her knees. Patient report that her appetite is poor as she is not hungry. She is on a soft food diet. Patient was up walking two weeks ago, had a fall, was hospitalized (06/14/2022 - 06/17/2022), due to hydration and pneumonia and has not walked since that time. She remains bedbound. Patient cognitive deficits is progressing.   Patient is married and live in the home with her husband. She was born and raised in [place], [place]. She has two children, as son [place]ia [place]) who lives in the home with them and a daughter that lives in [place]. Patient has sitter from [facility], [place], Wed-Sunday for 6-8 hours per day.  Patient is a DNR. Patient's daughter is her POA/HCPOA.  Dementia notebook left.   SOCIAL HX: Patient is married and live in the home with her husband. She was born and raised in [place], [place]. She has two children, as son [place]ia [place]) who lives in the home with them and a daughter that lives in [place]. Patient has sitter from [facility], [place], Wed-Sunday for 6-8 hours per day.  Patient is a DNR. Patient's daughter is her POA/HCPOA.  Dementia notebook left.  Social History   Tobacco Use   Smoking  status: Never   Smokeless tobacco: Never   Tobacco comments:    Never Used Tobacco  Substance Use Topics   Alcohol use: No    Alcohol/week: 0.0 standard drinks of alcohol    CODE STATUS: DNR requested ADVANCED DIRECTIVES: Yes MOST FORM COMPLETE: No HOSPICE EDUCATION PROVIDED: NO  Duration of visit and documentation: 60 minutes  Leslie Lacivita, LCSW

## 2022-06-22 ENCOUNTER — Telehealth: Payer: Self-pay | Admitting: Family Medicine

## 2022-06-22 NOTE — Telephone Encounter (Signed)
FYI:  Spoke with patients son regarding message below per son patient is needing an order for catheter supplies. Checked with son our records show that patient has a different PCP and have not been seen at [place] location in over 3 years. Patients son said he had forgotten that she had a new [facility] doctor and would give them a call for the requested order for patient.

## 2022-06-22 NOTE — Telephone Encounter (Signed)
Pt called and said she need catheter care and also need [facility] to bring her supplies . Pt mentioned her catheter is leaking and this is urgent . Please call pt

## 2022-06-22 NOTE — Telephone Encounter (Signed)
Pt called and said that she need a catheter care because

## 2022-06-23 DIAGNOSIS — F32A Depression, unspecified: Secondary | ICD-10-CM | POA: Diagnosis not present

## 2022-06-23 DIAGNOSIS — K5190 Ulcerative colitis, unspecified, without complications: Secondary | ICD-10-CM | POA: Diagnosis not present

## 2022-06-23 DIAGNOSIS — L89151 Pressure ulcer of sacral region, stage 1: Secondary | ICD-10-CM | POA: Diagnosis not present

## 2022-06-23 DIAGNOSIS — K219 Gastro-esophageal reflux disease without esophagitis: Secondary | ICD-10-CM | POA: Diagnosis not present

## 2022-06-23 DIAGNOSIS — E039 Hypothyroidism, unspecified: Secondary | ICD-10-CM | POA: Diagnosis not present

## 2022-06-23 DIAGNOSIS — F419 Anxiety disorder, unspecified: Secondary | ICD-10-CM | POA: Diagnosis not present

## 2022-06-23 DIAGNOSIS — E785 Hyperlipidemia, unspecified: Secondary | ICD-10-CM | POA: Diagnosis not present

## 2022-06-23 DIAGNOSIS — E119 Type 2 diabetes mellitus without complications: Secondary | ICD-10-CM | POA: Diagnosis not present

## 2022-06-23 DIAGNOSIS — Z7984 Long term (current) use of oral hypoglycemic drugs: Secondary | ICD-10-CM | POA: Diagnosis not present

## 2022-06-23 DIAGNOSIS — J189 Pneumonia, unspecified organism: Secondary | ICD-10-CM | POA: Diagnosis not present

## 2022-06-23 DIAGNOSIS — R5382 Chronic fatigue, unspecified: Secondary | ICD-10-CM | POA: Diagnosis not present

## 2022-06-23 DIAGNOSIS — I10 Essential (primary) hypertension: Secondary | ICD-10-CM | POA: Diagnosis not present

## 2022-06-25 DIAGNOSIS — Z7984 Long term (current) use of oral hypoglycemic drugs: Secondary | ICD-10-CM | POA: Diagnosis not present

## 2022-06-25 DIAGNOSIS — F419 Anxiety disorder, unspecified: Secondary | ICD-10-CM | POA: Diagnosis not present

## 2022-06-25 DIAGNOSIS — J189 Pneumonia, unspecified organism: Secondary | ICD-10-CM | POA: Diagnosis not present

## 2022-06-25 DIAGNOSIS — E119 Type 2 diabetes mellitus without complications: Secondary | ICD-10-CM | POA: Diagnosis not present

## 2022-06-25 DIAGNOSIS — K5190 Ulcerative colitis, unspecified, without complications: Secondary | ICD-10-CM | POA: Diagnosis not present

## 2022-06-25 DIAGNOSIS — F32A Depression, unspecified: Secondary | ICD-10-CM | POA: Diagnosis not present

## 2022-06-29 DIAGNOSIS — F419 Anxiety disorder, unspecified: Secondary | ICD-10-CM | POA: Diagnosis not present

## 2022-06-29 DIAGNOSIS — E119 Type 2 diabetes mellitus without complications: Secondary | ICD-10-CM | POA: Diagnosis not present

## 2022-06-29 DIAGNOSIS — F32A Depression, unspecified: Secondary | ICD-10-CM | POA: Diagnosis not present

## 2022-06-29 DIAGNOSIS — K5190 Ulcerative colitis, unspecified, without complications: Secondary | ICD-10-CM | POA: Diagnosis not present

## 2022-06-29 DIAGNOSIS — J189 Pneumonia, unspecified organism: Secondary | ICD-10-CM | POA: Diagnosis not present

## 2022-06-29 DIAGNOSIS — Z7984 Long term (current) use of oral hypoglycemic drugs: Secondary | ICD-10-CM | POA: Diagnosis not present

## 2022-06-30 DIAGNOSIS — E119 Type 2 diabetes mellitus without complications: Secondary | ICD-10-CM | POA: Diagnosis not present

## 2022-06-30 DIAGNOSIS — F419 Anxiety disorder, unspecified: Secondary | ICD-10-CM | POA: Diagnosis not present

## 2022-06-30 DIAGNOSIS — Z7984 Long term (current) use of oral hypoglycemic drugs: Secondary | ICD-10-CM | POA: Diagnosis not present

## 2022-06-30 DIAGNOSIS — K5190 Ulcerative colitis, unspecified, without complications: Secondary | ICD-10-CM | POA: Diagnosis not present

## 2022-06-30 DIAGNOSIS — J189 Pneumonia, unspecified organism: Secondary | ICD-10-CM | POA: Diagnosis not present

## 2022-06-30 DIAGNOSIS — F32A Depression, unspecified: Secondary | ICD-10-CM | POA: Diagnosis not present

## 2022-07-01 DIAGNOSIS — K5190 Ulcerative colitis, unspecified, without complications: Secondary | ICD-10-CM | POA: Diagnosis not present

## 2022-07-01 DIAGNOSIS — Z7984 Long term (current) use of oral hypoglycemic drugs: Secondary | ICD-10-CM | POA: Diagnosis not present

## 2022-07-01 DIAGNOSIS — F419 Anxiety disorder, unspecified: Secondary | ICD-10-CM | POA: Diagnosis not present

## 2022-07-01 DIAGNOSIS — J189 Pneumonia, unspecified organism: Secondary | ICD-10-CM | POA: Diagnosis not present

## 2022-07-01 DIAGNOSIS — F32A Depression, unspecified: Secondary | ICD-10-CM | POA: Diagnosis not present

## 2022-07-01 DIAGNOSIS — E119 Type 2 diabetes mellitus without complications: Secondary | ICD-10-CM | POA: Diagnosis not present

## 2022-07-02 DIAGNOSIS — K5190 Ulcerative colitis, unspecified, without complications: Secondary | ICD-10-CM | POA: Diagnosis not present

## 2022-07-02 DIAGNOSIS — Z7984 Long term (current) use of oral hypoglycemic drugs: Secondary | ICD-10-CM | POA: Diagnosis not present

## 2022-07-02 DIAGNOSIS — E119 Type 2 diabetes mellitus without complications: Secondary | ICD-10-CM | POA: Diagnosis not present

## 2022-07-02 DIAGNOSIS — F32A Depression, unspecified: Secondary | ICD-10-CM | POA: Diagnosis not present

## 2022-07-02 DIAGNOSIS — F419 Anxiety disorder, unspecified: Secondary | ICD-10-CM | POA: Diagnosis not present

## 2022-07-02 DIAGNOSIS — J189 Pneumonia, unspecified organism: Secondary | ICD-10-CM | POA: Diagnosis not present

## 2022-07-07 DIAGNOSIS — F419 Anxiety disorder, unspecified: Secondary | ICD-10-CM | POA: Diagnosis not present

## 2022-07-07 DIAGNOSIS — J189 Pneumonia, unspecified organism: Secondary | ICD-10-CM | POA: Diagnosis not present

## 2022-07-07 DIAGNOSIS — F32A Depression, unspecified: Secondary | ICD-10-CM | POA: Diagnosis not present

## 2022-07-07 DIAGNOSIS — Z7984 Long term (current) use of oral hypoglycemic drugs: Secondary | ICD-10-CM | POA: Diagnosis not present

## 2022-07-07 DIAGNOSIS — E119 Type 2 diabetes mellitus without complications: Secondary | ICD-10-CM | POA: Diagnosis not present

## 2022-07-07 DIAGNOSIS — K5190 Ulcerative colitis, unspecified, without complications: Secondary | ICD-10-CM | POA: Diagnosis not present

## 2022-07-08 DIAGNOSIS — F32A Depression, unspecified: Secondary | ICD-10-CM | POA: Diagnosis not present

## 2022-07-08 DIAGNOSIS — K5190 Ulcerative colitis, unspecified, without complications: Secondary | ICD-10-CM | POA: Diagnosis not present

## 2022-07-08 DIAGNOSIS — E119 Type 2 diabetes mellitus without complications: Secondary | ICD-10-CM | POA: Diagnosis not present

## 2022-07-08 DIAGNOSIS — J189 Pneumonia, unspecified organism: Secondary | ICD-10-CM | POA: Diagnosis not present

## 2022-07-08 DIAGNOSIS — F419 Anxiety disorder, unspecified: Secondary | ICD-10-CM | POA: Diagnosis not present

## 2022-07-08 DIAGNOSIS — Z7984 Long term (current) use of oral hypoglycemic drugs: Secondary | ICD-10-CM | POA: Diagnosis not present

## 2022-07-09 ENCOUNTER — Encounter: Payer: Self-pay | Admitting: Hematology & Oncology

## 2022-07-09 ENCOUNTER — Emergency Department (HOSPITAL_COMMUNITY): Payer: Medicare Other

## 2022-07-09 ENCOUNTER — Emergency Department (HOSPITAL_COMMUNITY)
Admission: EM | Admit: 2022-07-09 | Discharge: 2022-07-09 | Disposition: A | Discharge status: Home / Self Care | Payer: Medicare Other | Attending: Emergency Medicine | Admitting: Emergency Medicine

## 2022-07-09 ENCOUNTER — Other Ambulatory Visit: Payer: Self-pay

## 2022-07-09 DIAGNOSIS — E119 Type 2 diabetes mellitus without complications: Secondary | ICD-10-CM | POA: Diagnosis not present

## 2022-07-09 DIAGNOSIS — Z960 Presence of urogenital implants: Secondary | ICD-10-CM | POA: Diagnosis not present

## 2022-07-09 DIAGNOSIS — J9811 Atelectasis: Secondary | ICD-10-CM | POA: Diagnosis not present

## 2022-07-09 DIAGNOSIS — Z7984 Long term (current) use of oral hypoglycemic drugs: Secondary | ICD-10-CM | POA: Diagnosis not present

## 2022-07-09 DIAGNOSIS — F419 Anxiety disorder, unspecified: Secondary | ICD-10-CM | POA: Diagnosis not present

## 2022-07-09 DIAGNOSIS — Z043 Encounter for examination and observation following other accident: Secondary | ICD-10-CM | POA: Diagnosis not present

## 2022-07-09 DIAGNOSIS — Z041 Encounter for examination and observation following transport accident: Secondary | ICD-10-CM | POA: Insufficient documentation

## 2022-07-09 DIAGNOSIS — K5190 Ulcerative colitis, unspecified, without complications: Secondary | ICD-10-CM | POA: Diagnosis not present

## 2022-07-09 DIAGNOSIS — J189 Pneumonia, unspecified organism: Secondary | ICD-10-CM | POA: Diagnosis not present

## 2022-07-09 DIAGNOSIS — F32A Depression, unspecified: Secondary | ICD-10-CM | POA: Diagnosis not present
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
## 2022-07-09 NOTE — Discharge Instructions (Signed)
Your x-rays did not demonstrate any fractures or other obvious abnormalities.  However, if you develop new, or concerning changes do not hesitate to return here for additional evaluation.
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
## 2022-07-12 ENCOUNTER — Telehealth: Payer: Self-pay

## 2022-07-12 DIAGNOSIS — K5190 Ulcerative colitis, unspecified, without complications: Secondary | ICD-10-CM | POA: Diagnosis not present

## 2022-07-12 DIAGNOSIS — J189 Pneumonia, unspecified organism: Secondary | ICD-10-CM | POA: Diagnosis not present

## 2022-07-12 DIAGNOSIS — Z7984 Long term (current) use of oral hypoglycemic drugs: Secondary | ICD-10-CM | POA: Diagnosis not present

## 2022-07-12 DIAGNOSIS — F32A Depression, unspecified: Secondary | ICD-10-CM | POA: Diagnosis not present

## 2022-07-12 DIAGNOSIS — F419 Anxiety disorder, unspecified: Secondary | ICD-10-CM | POA: Diagnosis not present

## 2022-07-12 DIAGNOSIS — E119 Type 2 diabetes mellitus without complications: Secondary | ICD-10-CM | POA: Diagnosis not present

## 2022-07-12 NOTE — Telephone Encounter (Signed)
Telephone Encounter  4:00pm - returned call to [facility] -caregiver who called about pt being constipated. LPN directed Raquel Sarna to call pt's PCP. Raquel Sarna stated that she had already called the PCP last week and was told to give the pt Senna and Lactulose and if no BM go to the ED. Raquel Sarna reports it has been 10 days, pt has still not had a BM and does not want to go to the ED. LPN encouraged Raquel Sarna to call the PCP to follow up and she voiced understanding and agreement.   Lorelee Market, LPN [facility] Palliative Care [phone]

## 2022-07-12 NOTE — Telephone Encounter (Signed)
328 pm.  Incoming call from caregiver Raquel Sarna.  OT saw patient this am and contacted PCP for hypotension.  Patient has also not had a bm in 12 days.  Patient was directed to go to the ED for evaluation but she had declined to do so.  Caregiver is asking if a PC nurse can administer an enema to patient.  Advised I would notify her nurse Lorelee Market, LPN to return call regarding this request.

## 2022-07-16 ENCOUNTER — Emergency Department (HOSPITAL_COMMUNITY)
Admission: EM | Admit: 2022-07-16 | Discharge: 2022-07-16 | Disposition: A | Discharge status: Home / Self Care | Payer: Medicare Other | Attending: Emergency Medicine | Admitting: Emergency Medicine

## 2022-07-16 ENCOUNTER — Other Ambulatory Visit: Payer: Self-pay

## 2022-07-16 ENCOUNTER — Encounter (HOSPITAL_COMMUNITY): Payer: Self-pay

## 2022-07-16 ENCOUNTER — Emergency Department (HOSPITAL_COMMUNITY): Payer: Medicare Other

## 2022-07-16 DIAGNOSIS — E860 Dehydration: Secondary | ICD-10-CM | POA: Diagnosis not present

## 2022-07-16 DIAGNOSIS — I10 Essential (primary) hypertension: Secondary | ICD-10-CM | POA: Diagnosis not present

## 2022-07-16 DIAGNOSIS — Z7984 Long term (current) use of oral hypoglycemic drugs: Secondary | ICD-10-CM | POA: Insufficient documentation

## 2022-07-16 DIAGNOSIS — F0390 Unspecified dementia without behavioral disturbance: Secondary | ICD-10-CM | POA: Diagnosis not present

## 2022-07-16 DIAGNOSIS — K5900 Constipation, unspecified: Secondary | ICD-10-CM | POA: Insufficient documentation

## 2022-07-16 DIAGNOSIS — Z79899 Other long term (current) drug therapy: Secondary | ICD-10-CM | POA: Insufficient documentation

## 2022-07-16 DIAGNOSIS — R109 Unspecified abdominal pain: Secondary | ICD-10-CM | POA: Diagnosis not present

## 2022-07-16 HISTORY — DX: Unspecified dementia, unspecified severity, without behavioral disturbance, psychotic disturbance, mood disturbance, and anxiety: F03.90

## 2022-07-16 LAB — CBC
HCT: 37.4 % (ref 36.0–46.0)
Hemoglobin: 11.9 g/dL — ABNORMAL LOW (ref 12.0–15.0)
MCH: 28.8 pg (ref 26.0–34.0)
MCHC: 31.8 g/dL (ref 30.0–36.0)
MCV: 90.6 fL (ref 80.0–100.0)
Platelets: 274 10*3/uL (ref 150–400)
RBC: 4.13 MIL/uL (ref 3.87–5.11)
RDW: 15.9 % — ABNORMAL HIGH (ref 11.5–15.5)
WBC: 6.2 10*3/uL (ref 4.0–10.5)
nRBC: 0 % (ref 0.0–0.2)

## 2022-07-16 LAB — COMPREHENSIVE METABOLIC PANEL
ALT: 16 U/L (ref 0–44)
AST: 25 U/L (ref 15–41)
Albumin: 3.2 g/dL — ABNORMAL LOW (ref 3.5–5.0)
Alkaline Phosphatase: 72 U/L (ref 38–126)
Anion gap: 16 — ABNORMAL HIGH (ref 5–15)
BUN: 18 mg/dL (ref 8–23)
CO2: 19 mmol/L — ABNORMAL LOW (ref 22–32)
Calcium: 8.6 mg/dL — ABNORMAL LOW (ref 8.9–10.3)
Chloride: 99 mmol/L (ref 98–111)
Creatinine, Ser: 1.13 mg/dL — ABNORMAL HIGH (ref 0.44–1.00)
GFR, Estimated: 48 mL/min — ABNORMAL LOW (ref >60)
Glucose, Bld: 112 mg/dL — ABNORMAL HIGH (ref 70–99)
Potassium: 4.7 mmol/L (ref 3.5–5.1)
Sodium: 134 mmol/L — ABNORMAL LOW (ref 135–145)
Total Bilirubin: 0.6 mg/dL (ref 0.3–1.2)
Total Protein: 6.7 g/dL (ref 6.5–8.1)

## 2022-07-16 LAB — LIPASE, BLOOD: Lipase: 22 U/L (ref 11–51)

## 2022-07-16 MED ORDER — IOHEXOL 350 MG/ML SOLN
75.0000 mL | Freq: Once | INTRAVENOUS | Status: AC | PRN
  Administered 2022-07-16: 75 mL via INTRAVENOUS

## 2022-07-16 NOTE — Discharge Instructions (Addendum)
Keep taking the Senna daily, add in taking docusate as well as a capful of MiraLAX daily.  You can can try using one of the suppositories as well. For now avoid enemas due to the inflammation in the lower rectum that was seen on CT.  Inflammation in this area can make the walls of the rectum more delicate, doing something aggressive like an enema could cause more injury to the area or perforation.  You should also get back in touch with your gastroenterologist to make a clinic appointment to have the inflammation in the lower rectum further evaluated.  If you stop passing gas, have blood in your stool, have severe abdominal pain, fevers, persistent nausea or vomiting, or decreased food or fluid intake please return to the emergency department.

## 2022-07-16 NOTE — ED Provider Notes (Signed)
Received signout from [place], [place].  Patient initially presented with caretaker due to no bowel movement in the past 14 days.    At time of handoff, CT results were pending.   Physical Exam  BP 126/75   Pulse 73   Temp 98.6 F (37 C) (Oral)   Resp 17   Ht 5\' 3"  (1.6 m)   Wt 75 kg   SpO2 95%   BMI 29.29 kg/m    ED Course / MDM   Clinical Course as of 07/16/22 1550  Fri Jul 16, 2022  1548 W/ caretaker, no BM 14 days No impaction Has indwelling catheter, was changed today No obstruction in rectum []  Bowel regimen if CT good at home.  []  f//u CT [CO]    Clinical Course User Index [CO] Bradd Canary, MD   Medical Decision Making Amount and/or Complexity of Data Reviewed Labs: ordered. Radiology: ordered.  Risk Prescription drug management.   On reevaluation, patient is not having abdominal tenderness.  CT results showed stool burden, but no signs of obstruction.  It did show rectal inflammation and colitis.  Discussed with patient, she has a diagnosis of chronic colitis and has previously seen gastroenterology.  She has their contact information and will reach out to them for follow-up appointment.  In addition to the lactulose and senna that the patient has been taking, discussed with caregiver adding in docusate as well as daily MiraLAX.  They can also do suppositories as needed.  Refrain from doing enema as patient has inflammation around the rectum to avoid injury or perforation.  Patient was cleared for discharge with strict turn precautions for which caregiver expressed understanding.  Bradd Canary, MD    Bradd Canary, MD 07/16/22 Britt Bottom, MD 07/17/22 [phone]

## 2022-07-16 NOTE — ED Notes (Signed)
Caregiver at bedside and reporting that pt has had the foley in for 30 days and needs changed and that what is in her foley bag now has been sitting there for 16 hrs.

## 2022-07-16 NOTE — ED Provider Notes (Signed)
[place] Provider Note   CSN: [id number] Arrival date & time: 07/16/22  1232     History {Add pertinent medical, surgical, social history, OB history to HPI:1} Chief Complaint  Patient presents with   Constipation    Leslie Benton is a 82 y.o. female brought in by her caretaker for evaluation of constipation.  She currently has an indwelling Foley catheter that needs to get changed.  Her caregiver gives the history as the patient has some mild dementia.  Her caregiver states that she has had 14 days without making a bowel movement.  She has decreased sensation in her bowel and bladder and she has called multiple home health companies to see if someone could give her an enema however no one has come out or offered to be available to do so.  Her caretaker also states they were going to [facility] to get her catheter changed last week however she fell and they have been unable to get it changed.  The patient has no complaints of significant pain.  Her caretaker is concerned she may have a fecal impaction.   Constipation      Home Medications Prior to Admission medications   Medication Sig Start Date End Date Taking? Authorizing Provider  atorvastatin (LIPITOR) 10 MG tablet Take 10 mg by mouth daily.    [provider]  CRANBERRY PO Take by mouth. Patient not taking: Reported on 06/15/2022    [provider]  guaiFENesin (ROBITUSSIN) 100 MG/5ML liquid Take 5 mLs by mouth every 4 (four) hours as needed for cough or to loosen phlegm. 06/17/22   Terrilee Croak, MD  levothyroxine (SYNTHROID) 175 MCG tablet TAKE 1 TABLET(175 MCG) BY MOUTH DAILY BEFORE BREAKFAST Patient taking differently: Take 175 mcg by mouth daily before breakfast. 11/30/19   [name], MD  lisinopril (ZESTRIL) 10 MG tablet TAKE 1 TABLET(10 MG) BY MOUTH DAILY Patient taking differently: Take 10 mg by mouth daily. 01/03/20   [name], MD  loratadine (CLARITIN) 10 MG tablet Take 10 mg by mouth daily as needed for allergies or rhinitis.  Patient not taking: Reported on 06/15/2022    [provider]  MELATONIN PO Take 1 tablet by mouth at bedtime as needed.    [provider]  mesalamine (CANASA) 1000 MG suppository Place 1 suppository (1,000 mg total) rectally 2 (two) times daily. Patient not taking: Reported on 06/15/2022 01/14/19 02/13/19  Donne Hazel, MD  mesalamine (PENTASA) 250 MG CR capsule Take 4 capsules (1,000 mg total) by mouth 4 (four) times daily. Patient not taking: Reported on 06/15/2022 01/14/19 02/13/19  Donne Hazel, MD  metFORMIN (GLUCOPHAGE) 1000 MG tablet Take 1 tablet (1,000 mg total) by mouth 2 (two) times daily with a meal. 03/05/19   [name], MD  metoprolol tartrate (LOPRESSOR) 25 MG tablet Take 1 tablet (25 mg total) by mouth 4 (four) times daily. Patient not taking: Reported on 06/15/2022 01/14/19 02/13/19  Donne Hazel, MD  Multiple Vitamin (MULTIVITAMIN WITH MINERALS) TABS tablet Take 1 tablet by mouth daily. Patient not taking: Reported on 06/15/2022    [provider]  QUEtiapine (SEROQUEL) 25 MG tablet TAKE 1 TO 2 TABLETS BY MOUTH AT NIGHT AS NEEDED Patient not taking: Reported on 06/15/2022 05/16/20   Dutch Quint B, FNP      Allergies    Niacin and related, Lactose intolerance (gi), Amoxicillin, Penicillins, and Tilactase    Review  of Systems   Review of Systems  Gastrointestinal:  Positive for constipation.    Physical Exam Updated Vital Signs BP 120/69   Pulse 65   Temp 98.6 F (37 C) (Oral)   Resp 16   Ht 5\' 3"  (1.6 m)   Wt 75 kg   SpO2 95%   BMI 29.29 kg/m  Physical Exam Vitals and nursing note reviewed.  Constitutional:      General: She is not in acute distress.    Appearance: She is well-developed and underweight. She is not diaphoretic.  HENT:     Head: Normocephalic and atraumatic.     Right Ear: External ear  normal.     Left Ear: External ear normal.     Nose: Nose normal.     Mouth/Throat:     Mouth: Mucous membranes are moist.  Eyes:     General: No scleral icterus.    Conjunctiva/sclera: Conjunctivae normal.  Cardiovascular:     Rate and Rhythm: Normal rate and regular rhythm.     Heart sounds: Normal heart sounds. No murmur heard.    No friction rub. No gallop.  Pulmonary:     Effort: Pulmonary effort is normal. No respiratory distress.     Breath sounds: Normal breath sounds.  Abdominal:     General: Bowel sounds are normal. There is no distension.     Palpations: Abdomen is soft. There is no mass.     Tenderness: There is no abdominal tenderness. There is no guarding.  Genitourinary:    Comments: Digital Rectal Exam reveals sphincter with good tone. No thrombosed external hemorrhoids. No masses or fissures. Stool color is brown with no overt blood. No palpable fecal impaction  Musculoskeletal:     Cervical back: Normal range of motion.  Skin:    General: Skin is warm and dry.  Neurological:     Mental Status: She is alert and oriented to person, place, and time.  Psychiatric:        Behavior: Behavior normal.     ED Results / Procedures / Treatments   Labs (all labs ordered are listed, but only abnormal results are displayed) Labs Reviewed  COMPREHENSIVE METABOLIC PANEL  CBC  LIPASE, BLOOD    EKG None  Radiology No results found.  Procedures BLADDER CATHETERIZATION  Date/Time: 07/16/2022 2:05 PM  Performed by: Margarita Mail, PA-C Authorized by: Margarita Mail, PA-C   Consent:    Consent obtained:  Verbal   Consent given by:  Patient and healthcare agent   Risks discussed:  False passage, infection, urethral injury, incomplete procedure and pain Universal protocol:    Patient identity confirmed:  Arm band Pre-procedure details:    Procedure purpose:  Therapeutic Procedure details:    Provider performed due to:  Complicated insertion and nurse  unavailable   Catheter insertion:  Indwelling   Catheter type:  Foley   Catheter size: 14 fr.   Bladder irrigation: no     Number of attempts:  2   Urine characteristics:  Clear Post-procedure details:    Procedure completion:  Tolerated well, no immediate complications   {Document cardiac monitor, telemetry assessment procedure when appropriate:1}  Medications Ordered in ED Medications - No data to display  ED Course/ Medical Decision Making/ A&P   {   Click here for ABCD2, HEART and other calculatorsREFRESH Note before signing :1}  Medical Decision Making Amount and/or Complexity of Data Reviewed Labs: ordered. Radiology: ordered.   ***  {Document critical care time when appropriate:1} {Document review of labs and clinical decision tools ie heart score, Chads2Vasc2 etc:1}  {Document your independent review of radiology images, and any outside records:1} {Document your discussion with family members, caretakers, and with consultants:1} {Document social determinants of health affecting pt's care:1} {Document your decision making why or why not admission, treatments were needed:1} Final Clinical Impression(s) / ED Diagnoses Final diagnoses:  None    Rx / DC Orders ED Discharge Orders     None

## 2022-07-16 NOTE — ED Triage Notes (Addendum)
Pt arrived to ED via EMS from home w/ c/o constipation x 2 weeks, concern for dehydration d/t foley placed 2 days ago producing dark urine. Dementia and oriented to self at baseline. WC bound, chronic lower back pain and chronic foley. VSS  Pt oriented to self, place, time in triage

## 2022-07-17 NOTE — Progress Notes (Signed)
   07/16/22 1810  Spiritual Encounters  Type of Visit Initial  Care provided to: Patient;Friend  Referral source Chaplain assessment  Reason for visit Routine spiritual support  OnCall Visit Yes  Spiritual Framework  Presenting Themes Rituals and practive;Meaning/purpose/sources of inspiration  Interventions  Spiritual Care Interventions Made Reflective listening;Prayer   Visited patient who was waiting to be discharged. Patient had concerns. Spoke with patient and care provider CNA. Reduced anxiety. Provided support. Listening presents. Patient began to relax while reflecting on her grandson and new daughter in law. 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 Patient pleased with family. Patient calm. Being discharged.

## 2022-07-19 DIAGNOSIS — J189 Pneumonia, unspecified organism: Secondary | ICD-10-CM | POA: Diagnosis not present

## 2022-07-19 DIAGNOSIS — F419 Anxiety disorder, unspecified: Secondary | ICD-10-CM | POA: Diagnosis not present

## 2022-07-19 DIAGNOSIS — E119 Type 2 diabetes mellitus without complications: Secondary | ICD-10-CM | POA: Diagnosis not present

## 2022-07-19 DIAGNOSIS — Z7984 Long term (current) use of oral hypoglycemic drugs: Secondary | ICD-10-CM | POA: Diagnosis not present

## 2022-07-19 DIAGNOSIS — K5190 Ulcerative colitis, unspecified, without complications: Secondary | ICD-10-CM | POA: Diagnosis not present

## 2022-07-19 DIAGNOSIS — F32A Depression, unspecified: Secondary | ICD-10-CM | POA: Diagnosis not present

## 2022-07-21 DIAGNOSIS — J189 Pneumonia, unspecified organism: Secondary | ICD-10-CM | POA: Diagnosis not present

## 2022-07-21 DIAGNOSIS — E119 Type 2 diabetes mellitus without complications: Secondary | ICD-10-CM | POA: Diagnosis not present

## 2022-07-21 DIAGNOSIS — Z7984 Long term (current) use of oral hypoglycemic drugs: Secondary | ICD-10-CM | POA: Diagnosis not present

## 2022-07-21 DIAGNOSIS — F5104 Psychophysiologic insomnia: Secondary | ICD-10-CM | POA: Diagnosis not present

## 2022-07-21 DIAGNOSIS — F32A Depression, unspecified: Secondary | ICD-10-CM | POA: Diagnosis not present

## 2022-07-21 DIAGNOSIS — K5190 Ulcerative colitis, unspecified, without complications: Secondary | ICD-10-CM | POA: Diagnosis not present

## 2022-07-21 DIAGNOSIS — F419 Anxiety disorder, unspecified: Secondary | ICD-10-CM | POA: Diagnosis not present

## 2022-07-23 DIAGNOSIS — J189 Pneumonia, unspecified organism: Secondary | ICD-10-CM | POA: Diagnosis not present

## 2022-07-23 DIAGNOSIS — Z7984 Long term (current) use of oral hypoglycemic drugs: Secondary | ICD-10-CM | POA: Diagnosis not present

## 2022-07-23 DIAGNOSIS — I10 Essential (primary) hypertension: Secondary | ICD-10-CM | POA: Diagnosis not present

## 2022-07-23 DIAGNOSIS — E785 Hyperlipidemia, unspecified: Secondary | ICD-10-CM | POA: Diagnosis not present

## 2022-07-23 DIAGNOSIS — K219 Gastro-esophageal reflux disease without esophagitis: Secondary | ICD-10-CM | POA: Diagnosis not present

## 2022-07-23 DIAGNOSIS — F419 Anxiety disorder, unspecified: Secondary | ICD-10-CM | POA: Diagnosis not present

## 2022-07-23 DIAGNOSIS — E119 Type 2 diabetes mellitus without complications: Secondary | ICD-10-CM | POA: Diagnosis not present

## 2022-07-23 DIAGNOSIS — E039 Hypothyroidism, unspecified: Secondary | ICD-10-CM | POA: Diagnosis not present

## 2022-07-23 DIAGNOSIS — R5382 Chronic fatigue, unspecified: Secondary | ICD-10-CM | POA: Diagnosis not present

## 2022-07-23 DIAGNOSIS — L89151 Pressure ulcer of sacral region, stage 1: Secondary | ICD-10-CM | POA: Diagnosis not present

## 2022-07-23 DIAGNOSIS — F32A Depression, unspecified: Secondary | ICD-10-CM | POA: Diagnosis not present

## 2022-07-23 DIAGNOSIS — K5190 Ulcerative colitis, unspecified, without complications: Secondary | ICD-10-CM | POA: Diagnosis not present

## 2022-07-26 DIAGNOSIS — F419 Anxiety disorder, unspecified: Secondary | ICD-10-CM | POA: Diagnosis not present

## 2022-07-26 DIAGNOSIS — F32A Depression, unspecified: Secondary | ICD-10-CM | POA: Diagnosis not present

## 2022-07-26 DIAGNOSIS — K5190 Ulcerative colitis, unspecified, without complications: Secondary | ICD-10-CM | POA: Diagnosis not present

## 2022-07-26 DIAGNOSIS — E119 Type 2 diabetes mellitus without complications: Secondary | ICD-10-CM | POA: Diagnosis not present

## 2022-07-26 DIAGNOSIS — Z7984 Long term (current) use of oral hypoglycemic drugs: Secondary | ICD-10-CM | POA: Diagnosis not present

## 2022-07-26 DIAGNOSIS — J189 Pneumonia, unspecified organism: Secondary | ICD-10-CM | POA: Diagnosis not present

## 2022-07-28 DIAGNOSIS — E119 Type 2 diabetes mellitus without complications: Secondary | ICD-10-CM | POA: Diagnosis not present

## 2022-07-28 DIAGNOSIS — F32A Depression, unspecified: Secondary | ICD-10-CM | POA: Diagnosis not present

## 2022-07-28 DIAGNOSIS — Z7984 Long term (current) use of oral hypoglycemic drugs: Secondary | ICD-10-CM | POA: Diagnosis not present

## 2022-07-28 DIAGNOSIS — J189 Pneumonia, unspecified organism: Secondary | ICD-10-CM | POA: Diagnosis not present

## 2022-07-28 DIAGNOSIS — F419 Anxiety disorder, unspecified: Secondary | ICD-10-CM | POA: Diagnosis not present

## 2022-07-28 DIAGNOSIS — K5190 Ulcerative colitis, unspecified, without complications: Secondary | ICD-10-CM | POA: Diagnosis not present

## 2022-07-29 DIAGNOSIS — K5190 Ulcerative colitis, unspecified, without complications: Secondary | ICD-10-CM | POA: Diagnosis not present

## 2022-07-29 DIAGNOSIS — F32A Depression, unspecified: Secondary | ICD-10-CM | POA: Diagnosis not present

## 2022-07-29 DIAGNOSIS — J189 Pneumonia, unspecified organism: Secondary | ICD-10-CM | POA: Diagnosis not present

## 2022-07-29 DIAGNOSIS — Z7984 Long term (current) use of oral hypoglycemic drugs: Secondary | ICD-10-CM | POA: Diagnosis not present

## 2022-07-29 DIAGNOSIS — E119 Type 2 diabetes mellitus without complications: Secondary | ICD-10-CM | POA: Diagnosis not present

## 2022-07-29 DIAGNOSIS — F419 Anxiety disorder, unspecified: Secondary | ICD-10-CM | POA: Diagnosis not present

## 2022-07-30 DIAGNOSIS — F32A Depression, unspecified: Secondary | ICD-10-CM | POA: Diagnosis not present

## 2022-07-30 DIAGNOSIS — E119 Type 2 diabetes mellitus without complications: Secondary | ICD-10-CM | POA: Diagnosis not present

## 2022-07-30 DIAGNOSIS — F419 Anxiety disorder, unspecified: Secondary | ICD-10-CM | POA: Diagnosis not present

## 2022-07-30 DIAGNOSIS — K5190 Ulcerative colitis, unspecified, without complications: Secondary | ICD-10-CM | POA: Diagnosis not present

## 2022-07-30 DIAGNOSIS — Z7984 Long term (current) use of oral hypoglycemic drugs: Secondary | ICD-10-CM | POA: Diagnosis not present

## 2022-07-30 DIAGNOSIS — J189 Pneumonia, unspecified organism: Secondary | ICD-10-CM | POA: Diagnosis not present

## 2022-08-02 DIAGNOSIS — E119 Type 2 diabetes mellitus without complications: Secondary | ICD-10-CM | POA: Diagnosis not present

## 2022-08-02 DIAGNOSIS — Z7984 Long term (current) use of oral hypoglycemic drugs: Secondary | ICD-10-CM | POA: Diagnosis not present

## 2022-08-02 DIAGNOSIS — K5190 Ulcerative colitis, unspecified, without complications: Secondary | ICD-10-CM | POA: Diagnosis not present

## 2022-08-02 DIAGNOSIS — J189 Pneumonia, unspecified organism: Secondary | ICD-10-CM | POA: Diagnosis not present

## 2022-08-02 DIAGNOSIS — F419 Anxiety disorder, unspecified: Secondary | ICD-10-CM | POA: Diagnosis not present

## 2022-08-02 DIAGNOSIS — F32A Depression, unspecified: Secondary | ICD-10-CM | POA: Diagnosis not present

## 2022-08-03 DIAGNOSIS — Z7984 Long term (current) use of oral hypoglycemic drugs: Secondary | ICD-10-CM | POA: Diagnosis not present

## 2022-08-03 DIAGNOSIS — F32A Depression, unspecified: Secondary | ICD-10-CM | POA: Diagnosis not present

## 2022-08-03 DIAGNOSIS — J189 Pneumonia, unspecified organism: Secondary | ICD-10-CM | POA: Diagnosis not present

## 2022-08-03 DIAGNOSIS — F419 Anxiety disorder, unspecified: Secondary | ICD-10-CM | POA: Diagnosis not present

## 2022-08-03 DIAGNOSIS — E119 Type 2 diabetes mellitus without complications: Secondary | ICD-10-CM | POA: Diagnosis not present

## 2022-08-03 DIAGNOSIS — K5190 Ulcerative colitis, unspecified, without complications: Secondary | ICD-10-CM | POA: Diagnosis not present

## 2022-08-05 DIAGNOSIS — J189 Pneumonia, unspecified organism: Secondary | ICD-10-CM | POA: Diagnosis not present

## 2022-08-05 DIAGNOSIS — E119 Type 2 diabetes mellitus without complications: Secondary | ICD-10-CM | POA: Diagnosis not present

## 2022-08-05 DIAGNOSIS — F419 Anxiety disorder, unspecified: Secondary | ICD-10-CM | POA: Diagnosis not present

## 2022-08-05 DIAGNOSIS — K5190 Ulcerative colitis, unspecified, without complications: Secondary | ICD-10-CM | POA: Diagnosis not present

## 2022-08-05 DIAGNOSIS — F32A Depression, unspecified: Secondary | ICD-10-CM | POA: Diagnosis not present

## 2022-08-05 DIAGNOSIS — Z7984 Long term (current) use of oral hypoglycemic drugs: Secondary | ICD-10-CM | POA: Diagnosis not present

## 2022-08-10 DIAGNOSIS — F32A Depression, unspecified: Secondary | ICD-10-CM | POA: Diagnosis not present

## 2022-08-10 DIAGNOSIS — E119 Type 2 diabetes mellitus without complications: Secondary | ICD-10-CM | POA: Diagnosis not present

## 2022-08-10 DIAGNOSIS — Z7984 Long term (current) use of oral hypoglycemic drugs: Secondary | ICD-10-CM | POA: Diagnosis not present

## 2022-08-10 DIAGNOSIS — J189 Pneumonia, unspecified organism: Secondary | ICD-10-CM | POA: Diagnosis not present

## 2022-08-10 DIAGNOSIS — F419 Anxiety disorder, unspecified: Secondary | ICD-10-CM | POA: Diagnosis not present

## 2022-08-10 DIAGNOSIS — K5190 Ulcerative colitis, unspecified, without complications: Secondary | ICD-10-CM | POA: Diagnosis not present

## 2022-08-12 DIAGNOSIS — F32A Depression, unspecified: Secondary | ICD-10-CM | POA: Diagnosis not present

## 2022-08-12 DIAGNOSIS — Z7984 Long term (current) use of oral hypoglycemic drugs: Secondary | ICD-10-CM | POA: Diagnosis not present

## 2022-08-12 DIAGNOSIS — F419 Anxiety disorder, unspecified: Secondary | ICD-10-CM | POA: Diagnosis not present

## 2022-08-12 DIAGNOSIS — J189 Pneumonia, unspecified organism: Secondary | ICD-10-CM | POA: Diagnosis not present

## 2022-08-12 DIAGNOSIS — K5190 Ulcerative colitis, unspecified, without complications: Secondary | ICD-10-CM | POA: Diagnosis not present

## 2022-08-12 DIAGNOSIS — E119 Type 2 diabetes mellitus without complications: Secondary | ICD-10-CM | POA: Diagnosis not present

## 2022-08-16 DIAGNOSIS — K5190 Ulcerative colitis, unspecified, without complications: Secondary | ICD-10-CM | POA: Diagnosis not present

## 2022-08-16 DIAGNOSIS — J189 Pneumonia, unspecified organism: Secondary | ICD-10-CM | POA: Diagnosis not present

## 2022-08-16 DIAGNOSIS — E119 Type 2 diabetes mellitus without complications: Secondary | ICD-10-CM | POA: Diagnosis not present

## 2022-08-16 DIAGNOSIS — F32A Depression, unspecified: Secondary | ICD-10-CM | POA: Diagnosis not present

## 2022-08-16 DIAGNOSIS — Z7984 Long term (current) use of oral hypoglycemic drugs: Secondary | ICD-10-CM | POA: Diagnosis not present

## 2022-08-16 DIAGNOSIS — F419 Anxiety disorder, unspecified: Secondary | ICD-10-CM | POA: Diagnosis not present

## 2022-08-17 DIAGNOSIS — F419 Anxiety disorder, unspecified: Secondary | ICD-10-CM | POA: Diagnosis not present

## 2022-08-17 DIAGNOSIS — F32A Depression, unspecified: Secondary | ICD-10-CM | POA: Diagnosis not present

## 2022-08-17 DIAGNOSIS — E119 Type 2 diabetes mellitus without complications: Secondary | ICD-10-CM | POA: Diagnosis not present

## 2022-08-17 DIAGNOSIS — Z7984 Long term (current) use of oral hypoglycemic drugs: Secondary | ICD-10-CM | POA: Diagnosis not present

## 2022-08-17 DIAGNOSIS — J189 Pneumonia, unspecified organism: Secondary | ICD-10-CM | POA: Diagnosis not present

## 2022-08-17 DIAGNOSIS — K5190 Ulcerative colitis, unspecified, without complications: Secondary | ICD-10-CM | POA: Diagnosis not present

## 2022-08-18 DIAGNOSIS — E039 Hypothyroidism, unspecified: Secondary | ICD-10-CM | POA: Diagnosis not present

## 2022-08-18 DIAGNOSIS — E1165 Type 2 diabetes mellitus with hyperglycemia: Secondary | ICD-10-CM | POA: Diagnosis not present

## 2022-08-18 DIAGNOSIS — E119 Type 2 diabetes mellitus without complications: Secondary | ICD-10-CM | POA: Diagnosis not present

## 2022-08-18 DIAGNOSIS — I10 Essential (primary) hypertension: Secondary | ICD-10-CM | POA: Diagnosis not present

## 2022-08-18 DIAGNOSIS — F419 Anxiety disorder, unspecified: Secondary | ICD-10-CM | POA: Diagnosis not present

## 2022-08-18 DIAGNOSIS — F32A Depression, unspecified: Secondary | ICD-10-CM | POA: Diagnosis not present

## 2022-08-19 DIAGNOSIS — F419 Anxiety disorder, unspecified: Secondary | ICD-10-CM | POA: Diagnosis not present

## 2022-08-19 DIAGNOSIS — K5190 Ulcerative colitis, unspecified, without complications: Secondary | ICD-10-CM | POA: Diagnosis not present

## 2022-08-19 DIAGNOSIS — E119 Type 2 diabetes mellitus without complications: Secondary | ICD-10-CM | POA: Diagnosis not present

## 2022-08-19 DIAGNOSIS — F32A Depression, unspecified: Secondary | ICD-10-CM | POA: Diagnosis not present

## 2022-08-19 DIAGNOSIS — Z7984 Long term (current) use of oral hypoglycemic drugs: Secondary | ICD-10-CM | POA: Diagnosis not present

## 2022-08-19 DIAGNOSIS — J189 Pneumonia, unspecified organism: Secondary | ICD-10-CM | POA: Diagnosis not present

## 2022-08-22 DIAGNOSIS — F419 Anxiety disorder, unspecified: Secondary | ICD-10-CM | POA: Diagnosis not present

## 2022-08-22 DIAGNOSIS — K219 Gastro-esophageal reflux disease without esophagitis: Secondary | ICD-10-CM | POA: Diagnosis not present

## 2022-08-22 DIAGNOSIS — Z7984 Long term (current) use of oral hypoglycemic drugs: Secondary | ICD-10-CM | POA: Diagnosis not present

## 2022-08-22 DIAGNOSIS — R5382 Chronic fatigue, unspecified: Secondary | ICD-10-CM | POA: Diagnosis not present

## 2022-08-22 DIAGNOSIS — J189 Pneumonia, unspecified organism: Secondary | ICD-10-CM | POA: Diagnosis not present

## 2022-08-22 DIAGNOSIS — I10 Essential (primary) hypertension: Secondary | ICD-10-CM | POA: Diagnosis not present

## 2022-08-22 DIAGNOSIS — E039 Hypothyroidism, unspecified: Secondary | ICD-10-CM | POA: Diagnosis not present

## 2022-08-22 DIAGNOSIS — E119 Type 2 diabetes mellitus without complications: Secondary | ICD-10-CM | POA: Diagnosis not present

## 2022-08-22 DIAGNOSIS — E785 Hyperlipidemia, unspecified: Secondary | ICD-10-CM | POA: Diagnosis not present

## 2022-08-22 DIAGNOSIS — Z8701 Personal history of pneumonia (recurrent): Secondary | ICD-10-CM | POA: Diagnosis not present

## 2022-08-22 DIAGNOSIS — F32A Depression, unspecified: Secondary | ICD-10-CM | POA: Diagnosis not present

## 2022-08-22 DIAGNOSIS — K5190 Ulcerative colitis, unspecified, without complications: Secondary | ICD-10-CM | POA: Diagnosis not present

## 2022-08-24 DIAGNOSIS — E119 Type 2 diabetes mellitus without complications: Secondary | ICD-10-CM | POA: Diagnosis not present

## 2022-08-24 DIAGNOSIS — Z7984 Long term (current) use of oral hypoglycemic drugs: Secondary | ICD-10-CM | POA: Diagnosis not present

## 2022-08-24 DIAGNOSIS — F419 Anxiety disorder, unspecified: Secondary | ICD-10-CM | POA: Diagnosis not present

## 2022-08-24 DIAGNOSIS — F32A Depression, unspecified: Secondary | ICD-10-CM | POA: Diagnosis not present

## 2022-08-24 DIAGNOSIS — R5382 Chronic fatigue, unspecified: Secondary | ICD-10-CM | POA: Diagnosis not present

## 2022-08-24 DIAGNOSIS — I10 Essential (primary) hypertension: Secondary | ICD-10-CM | POA: Diagnosis not present

## 2022-08-25 DIAGNOSIS — E119 Type 2 diabetes mellitus without complications: Secondary | ICD-10-CM | POA: Diagnosis not present

## 2022-08-25 DIAGNOSIS — F419 Anxiety disorder, unspecified: Secondary | ICD-10-CM | POA: Diagnosis not present

## 2022-08-25 DIAGNOSIS — F32A Depression, unspecified: Secondary | ICD-10-CM | POA: Diagnosis not present

## 2022-08-25 DIAGNOSIS — R5382 Chronic fatigue, unspecified: Secondary | ICD-10-CM | POA: Diagnosis not present

## 2022-08-25 DIAGNOSIS — Z7984 Long term (current) use of oral hypoglycemic drugs: Secondary | ICD-10-CM | POA: Diagnosis not present

## 2022-08-25 DIAGNOSIS — I10 Essential (primary) hypertension: Secondary | ICD-10-CM | POA: Diagnosis not present

## 2022-08-26 DIAGNOSIS — E119 Type 2 diabetes mellitus without complications: Secondary | ICD-10-CM | POA: Diagnosis not present

## 2022-08-26 DIAGNOSIS — R5382 Chronic fatigue, unspecified: Secondary | ICD-10-CM | POA: Diagnosis not present

## 2022-08-26 DIAGNOSIS — Z7984 Long term (current) use of oral hypoglycemic drugs: Secondary | ICD-10-CM | POA: Diagnosis not present

## 2022-08-26 DIAGNOSIS — F419 Anxiety disorder, unspecified: Secondary | ICD-10-CM | POA: Diagnosis not present

## 2022-08-26 DIAGNOSIS — I10 Essential (primary) hypertension: Secondary | ICD-10-CM | POA: Diagnosis not present

## 2022-08-26 DIAGNOSIS — F32A Depression, unspecified: Secondary | ICD-10-CM | POA: Diagnosis not present

## 2022-08-27 DIAGNOSIS — R338 Other retention of urine: Secondary | ICD-10-CM | POA: Diagnosis not present

## 2022-08-27 DIAGNOSIS — E119 Type 2 diabetes mellitus without complications: Secondary | ICD-10-CM | POA: Diagnosis not present

## 2022-08-27 DIAGNOSIS — F419 Anxiety disorder, unspecified: Secondary | ICD-10-CM | POA: Diagnosis not present

## 2022-08-27 DIAGNOSIS — Z7984 Long term (current) use of oral hypoglycemic drugs: Secondary | ICD-10-CM | POA: Diagnosis not present

## 2022-08-27 DIAGNOSIS — I10 Essential (primary) hypertension: Secondary | ICD-10-CM | POA: Diagnosis not present

## 2022-08-27 DIAGNOSIS — R5382 Chronic fatigue, unspecified: Secondary | ICD-10-CM | POA: Diagnosis not present

## 2022-08-27 DIAGNOSIS — F32A Depression, unspecified: Secondary | ICD-10-CM | POA: Diagnosis not present

## 2022-08-30 DIAGNOSIS — F419 Anxiety disorder, unspecified: Secondary | ICD-10-CM | POA: Diagnosis not present

## 2022-08-30 DIAGNOSIS — F32A Depression, unspecified: Secondary | ICD-10-CM | POA: Diagnosis not present

## 2022-08-30 DIAGNOSIS — E119 Type 2 diabetes mellitus without complications: Secondary | ICD-10-CM | POA: Diagnosis not present

## 2022-08-30 DIAGNOSIS — I10 Essential (primary) hypertension: Secondary | ICD-10-CM | POA: Diagnosis not present

## 2022-08-30 DIAGNOSIS — R5382 Chronic fatigue, unspecified: Secondary | ICD-10-CM | POA: Diagnosis not present

## 2022-08-30 DIAGNOSIS — Z7984 Long term (current) use of oral hypoglycemic drugs: Secondary | ICD-10-CM | POA: Diagnosis not present

## 2022-09-01 DIAGNOSIS — R5382 Chronic fatigue, unspecified: Secondary | ICD-10-CM | POA: Diagnosis not present

## 2022-09-01 DIAGNOSIS — F32A Depression, unspecified: Secondary | ICD-10-CM | POA: Diagnosis not present

## 2022-09-01 DIAGNOSIS — E119 Type 2 diabetes mellitus without complications: Secondary | ICD-10-CM | POA: Diagnosis not present

## 2022-09-01 DIAGNOSIS — F419 Anxiety disorder, unspecified: Secondary | ICD-10-CM | POA: Diagnosis not present

## 2022-09-01 DIAGNOSIS — Z7984 Long term (current) use of oral hypoglycemic drugs: Secondary | ICD-10-CM | POA: Diagnosis not present

## 2022-09-01 DIAGNOSIS — I10 Essential (primary) hypertension: Secondary | ICD-10-CM | POA: Diagnosis not present

## 2022-09-02 DIAGNOSIS — Z7984 Long term (current) use of oral hypoglycemic drugs: Secondary | ICD-10-CM | POA: Diagnosis not present

## 2022-09-02 DIAGNOSIS — I10 Essential (primary) hypertension: Secondary | ICD-10-CM | POA: Diagnosis not present

## 2022-09-02 DIAGNOSIS — F419 Anxiety disorder, unspecified: Secondary | ICD-10-CM | POA: Diagnosis not present

## 2022-09-02 DIAGNOSIS — E119 Type 2 diabetes mellitus without complications: Secondary | ICD-10-CM | POA: Diagnosis not present

## 2022-09-02 DIAGNOSIS — R5382 Chronic fatigue, unspecified: Secondary | ICD-10-CM | POA: Diagnosis not present

## 2022-09-02 DIAGNOSIS — F32A Depression, unspecified: Secondary | ICD-10-CM | POA: Diagnosis not present

## 2022-09-03 DIAGNOSIS — F32A Depression, unspecified: Secondary | ICD-10-CM | POA: Diagnosis not present

## 2022-09-03 DIAGNOSIS — R5382 Chronic fatigue, unspecified: Secondary | ICD-10-CM | POA: Diagnosis not present

## 2022-09-03 DIAGNOSIS — I10 Essential (primary) hypertension: Secondary | ICD-10-CM | POA: Diagnosis not present

## 2022-09-03 DIAGNOSIS — Z7984 Long term (current) use of oral hypoglycemic drugs: Secondary | ICD-10-CM | POA: Diagnosis not present

## 2022-09-03 DIAGNOSIS — F419 Anxiety disorder, unspecified: Secondary | ICD-10-CM | POA: Diagnosis not present

## 2022-09-03 DIAGNOSIS — E119 Type 2 diabetes mellitus without complications: Secondary | ICD-10-CM | POA: Diagnosis not present

## 2022-09-06 DIAGNOSIS — F32A Depression, unspecified: Secondary | ICD-10-CM | POA: Diagnosis not present

## 2022-09-06 DIAGNOSIS — E119 Type 2 diabetes mellitus without complications: Secondary | ICD-10-CM | POA: Diagnosis not present

## 2022-09-06 DIAGNOSIS — I10 Essential (primary) hypertension: Secondary | ICD-10-CM | POA: Diagnosis not present

## 2022-09-06 DIAGNOSIS — Z7984 Long term (current) use of oral hypoglycemic drugs: Secondary | ICD-10-CM | POA: Diagnosis not present

## 2022-09-06 DIAGNOSIS — R5382 Chronic fatigue, unspecified: Secondary | ICD-10-CM | POA: Diagnosis not present

## 2022-09-06 DIAGNOSIS — F419 Anxiety disorder, unspecified: Secondary | ICD-10-CM | POA: Diagnosis not present

## 2022-09-14 DIAGNOSIS — E119 Type 2 diabetes mellitus without complications: Secondary | ICD-10-CM | POA: Diagnosis not present

## 2022-09-14 DIAGNOSIS — Z7984 Long term (current) use of oral hypoglycemic drugs: Secondary | ICD-10-CM | POA: Diagnosis not present

## 2022-09-14 DIAGNOSIS — F419 Anxiety disorder, unspecified: Secondary | ICD-10-CM | POA: Diagnosis not present

## 2022-09-14 DIAGNOSIS — I10 Essential (primary) hypertension: Secondary | ICD-10-CM | POA: Diagnosis not present

## 2022-09-14 DIAGNOSIS — R5382 Chronic fatigue, unspecified: Secondary | ICD-10-CM | POA: Diagnosis not present

## 2022-09-14 DIAGNOSIS — F32A Depression, unspecified: Secondary | ICD-10-CM | POA: Diagnosis not present

## 2022-09-15 DIAGNOSIS — Z7984 Long term (current) use of oral hypoglycemic drugs: Secondary | ICD-10-CM | POA: Diagnosis not present

## 2022-09-15 DIAGNOSIS — E119 Type 2 diabetes mellitus without complications: Secondary | ICD-10-CM | POA: Diagnosis not present

## 2022-09-15 DIAGNOSIS — R338 Other retention of urine: Secondary | ICD-10-CM | POA: Diagnosis not present

## 2022-09-15 DIAGNOSIS — N3000 Acute cystitis without hematuria: Secondary | ICD-10-CM | POA: Diagnosis not present

## 2022-09-15 DIAGNOSIS — I10 Essential (primary) hypertension: Secondary | ICD-10-CM | POA: Diagnosis not present

## 2022-09-15 DIAGNOSIS — F419 Anxiety disorder, unspecified: Secondary | ICD-10-CM | POA: Diagnosis not present

## 2022-09-15 DIAGNOSIS — R5382 Chronic fatigue, unspecified: Secondary | ICD-10-CM | POA: Diagnosis not present

## 2022-09-15 DIAGNOSIS — F32A Depression, unspecified: Secondary | ICD-10-CM | POA: Diagnosis not present

## 2022-09-17 DIAGNOSIS — F32A Depression, unspecified: Secondary | ICD-10-CM | POA: Diagnosis not present

## 2022-09-17 DIAGNOSIS — F419 Anxiety disorder, unspecified: Secondary | ICD-10-CM | POA: Diagnosis not present

## 2022-09-17 DIAGNOSIS — I10 Essential (primary) hypertension: Secondary | ICD-10-CM | POA: Diagnosis not present

## 2022-09-17 DIAGNOSIS — E119 Type 2 diabetes mellitus without complications: Secondary | ICD-10-CM | POA: Diagnosis not present

## 2022-09-17 DIAGNOSIS — Z7984 Long term (current) use of oral hypoglycemic drugs: Secondary | ICD-10-CM | POA: Diagnosis not present

## 2022-09-17 DIAGNOSIS — R5382 Chronic fatigue, unspecified: Secondary | ICD-10-CM | POA: Diagnosis not present

## 2022-09-21 DIAGNOSIS — K219 Gastro-esophageal reflux disease without esophagitis: Secondary | ICD-10-CM | POA: Diagnosis not present

## 2022-09-21 DIAGNOSIS — F32A Depression, unspecified: Secondary | ICD-10-CM | POA: Diagnosis not present

## 2022-09-21 DIAGNOSIS — F419 Anxiety disorder, unspecified: Secondary | ICD-10-CM | POA: Diagnosis not present

## 2022-09-21 DIAGNOSIS — R5382 Chronic fatigue, unspecified: Secondary | ICD-10-CM | POA: Diagnosis not present

## 2022-09-21 DIAGNOSIS — I10 Essential (primary) hypertension: Secondary | ICD-10-CM | POA: Diagnosis not present

## 2022-09-21 DIAGNOSIS — K5190 Ulcerative colitis, unspecified, without complications: Secondary | ICD-10-CM | POA: Diagnosis not present

## 2022-09-21 DIAGNOSIS — E039 Hypothyroidism, unspecified: Secondary | ICD-10-CM | POA: Diagnosis not present

## 2022-09-21 DIAGNOSIS — E119 Type 2 diabetes mellitus without complications: Secondary | ICD-10-CM | POA: Diagnosis not present

## 2022-09-21 DIAGNOSIS — J189 Pneumonia, unspecified organism: Secondary | ICD-10-CM | POA: Diagnosis not present

## 2022-09-21 DIAGNOSIS — E785 Hyperlipidemia, unspecified: Secondary | ICD-10-CM | POA: Diagnosis not present

## 2022-09-21 DIAGNOSIS — Z8701 Personal history of pneumonia (recurrent): Secondary | ICD-10-CM | POA: Diagnosis not present

## 2022-09-21 DIAGNOSIS — Z7984 Long term (current) use of oral hypoglycemic drugs: Secondary | ICD-10-CM | POA: Diagnosis not present

## 2022-09-22 DIAGNOSIS — F419 Anxiety disorder, unspecified: Secondary | ICD-10-CM | POA: Diagnosis not present

## 2022-09-22 DIAGNOSIS — Z7984 Long term (current) use of oral hypoglycemic drugs: Secondary | ICD-10-CM | POA: Diagnosis not present

## 2022-09-22 DIAGNOSIS — F32A Depression, unspecified: Secondary | ICD-10-CM | POA: Diagnosis not present

## 2022-09-22 DIAGNOSIS — I10 Essential (primary) hypertension: Secondary | ICD-10-CM | POA: Diagnosis not present

## 2022-09-22 DIAGNOSIS — R5382 Chronic fatigue, unspecified: Secondary | ICD-10-CM | POA: Diagnosis not present

## 2022-09-22 DIAGNOSIS — E119 Type 2 diabetes mellitus without complications: Secondary | ICD-10-CM | POA: Diagnosis not present

## 2022-09-24 DIAGNOSIS — Z7984 Long term (current) use of oral hypoglycemic drugs: Secondary | ICD-10-CM | POA: Diagnosis not present

## 2022-09-24 DIAGNOSIS — F419 Anxiety disorder, unspecified: Secondary | ICD-10-CM | POA: Diagnosis not present

## 2022-09-24 DIAGNOSIS — E119 Type 2 diabetes mellitus without complications: Secondary | ICD-10-CM | POA: Diagnosis not present

## 2022-09-24 DIAGNOSIS — F32A Depression, unspecified: Secondary | ICD-10-CM | POA: Diagnosis not present

## 2022-09-24 DIAGNOSIS — R5382 Chronic fatigue, unspecified: Secondary | ICD-10-CM | POA: Diagnosis not present

## 2022-09-24 DIAGNOSIS — I10 Essential (primary) hypertension: Secondary | ICD-10-CM | POA: Diagnosis not present

## 2022-09-28 DIAGNOSIS — Z7984 Long term (current) use of oral hypoglycemic drugs: Secondary | ICD-10-CM | POA: Diagnosis not present

## 2022-09-28 DIAGNOSIS — F32A Depression, unspecified: Secondary | ICD-10-CM | POA: Diagnosis not present

## 2022-09-28 DIAGNOSIS — E119 Type 2 diabetes mellitus without complications: Secondary | ICD-10-CM | POA: Diagnosis not present

## 2022-09-28 DIAGNOSIS — I10 Essential (primary) hypertension: Secondary | ICD-10-CM | POA: Diagnosis not present

## 2022-09-28 DIAGNOSIS — R5382 Chronic fatigue, unspecified: Secondary | ICD-10-CM | POA: Diagnosis not present

## 2022-09-28 DIAGNOSIS — F419 Anxiety disorder, unspecified: Secondary | ICD-10-CM | POA: Diagnosis not present

## 2022-09-29 DIAGNOSIS — F419 Anxiety disorder, unspecified: Secondary | ICD-10-CM | POA: Diagnosis not present

## 2022-09-29 DIAGNOSIS — Z7984 Long term (current) use of oral hypoglycemic drugs: Secondary | ICD-10-CM | POA: Diagnosis not present

## 2022-09-29 DIAGNOSIS — R5382 Chronic fatigue, unspecified: Secondary | ICD-10-CM | POA: Diagnosis not present

## 2022-09-29 DIAGNOSIS — I10 Essential (primary) hypertension: Secondary | ICD-10-CM | POA: Diagnosis not present

## 2022-09-29 DIAGNOSIS — E119 Type 2 diabetes mellitus without complications: Secondary | ICD-10-CM | POA: Diagnosis not present

## 2022-09-29 DIAGNOSIS — F32A Depression, unspecified: Secondary | ICD-10-CM | POA: Diagnosis not present

## 2022-09-30 DIAGNOSIS — R5382 Chronic fatigue, unspecified: Secondary | ICD-10-CM | POA: Diagnosis not present

## 2022-09-30 DIAGNOSIS — Z7984 Long term (current) use of oral hypoglycemic drugs: Secondary | ICD-10-CM | POA: Diagnosis not present

## 2022-09-30 DIAGNOSIS — E119 Type 2 diabetes mellitus without complications: Secondary | ICD-10-CM | POA: Diagnosis not present

## 2022-09-30 DIAGNOSIS — F32A Depression, unspecified: Secondary | ICD-10-CM | POA: Diagnosis not present

## 2022-09-30 DIAGNOSIS — F419 Anxiety disorder, unspecified: Secondary | ICD-10-CM | POA: Diagnosis not present

## 2022-09-30 DIAGNOSIS — I10 Essential (primary) hypertension: Secondary | ICD-10-CM | POA: Diagnosis not present

## 2022-10-01 DIAGNOSIS — Z7984 Long term (current) use of oral hypoglycemic drugs: Secondary | ICD-10-CM | POA: Diagnosis not present

## 2022-10-01 DIAGNOSIS — F419 Anxiety disorder, unspecified: Secondary | ICD-10-CM | POA: Diagnosis not present

## 2022-10-01 DIAGNOSIS — E119 Type 2 diabetes mellitus without complications: Secondary | ICD-10-CM | POA: Diagnosis not present

## 2022-10-01 DIAGNOSIS — R5382 Chronic fatigue, unspecified: Secondary | ICD-10-CM | POA: Diagnosis not present

## 2022-10-01 DIAGNOSIS — I10 Essential (primary) hypertension: Secondary | ICD-10-CM | POA: Diagnosis not present

## 2022-10-01 DIAGNOSIS — F32A Depression, unspecified: Secondary | ICD-10-CM | POA: Diagnosis not present

## 2022-10-05 DIAGNOSIS — F32A Depression, unspecified: Secondary | ICD-10-CM | POA: Diagnosis not present

## 2022-10-05 DIAGNOSIS — F419 Anxiety disorder, unspecified: Secondary | ICD-10-CM | POA: Diagnosis not present

## 2022-10-05 DIAGNOSIS — I10 Essential (primary) hypertension: Secondary | ICD-10-CM | POA: Diagnosis not present

## 2022-10-05 DIAGNOSIS — E119 Type 2 diabetes mellitus without complications: Secondary | ICD-10-CM | POA: Diagnosis not present

## 2022-10-05 DIAGNOSIS — R5382 Chronic fatigue, unspecified: Secondary | ICD-10-CM | POA: Diagnosis not present

## 2022-10-05 DIAGNOSIS — Z7984 Long term (current) use of oral hypoglycemic drugs: Secondary | ICD-10-CM | POA: Diagnosis not present

## 2022-10-07 DIAGNOSIS — Z7984 Long term (current) use of oral hypoglycemic drugs: Secondary | ICD-10-CM | POA: Diagnosis not present

## 2022-10-07 DIAGNOSIS — F32A Depression, unspecified: Secondary | ICD-10-CM | POA: Diagnosis not present

## 2022-10-07 DIAGNOSIS — F419 Anxiety disorder, unspecified: Secondary | ICD-10-CM | POA: Diagnosis not present

## 2022-10-07 DIAGNOSIS — I10 Essential (primary) hypertension: Secondary | ICD-10-CM | POA: Diagnosis not present

## 2022-10-07 DIAGNOSIS — R5382 Chronic fatigue, unspecified: Secondary | ICD-10-CM | POA: Diagnosis not present

## 2022-10-07 DIAGNOSIS — E119 Type 2 diabetes mellitus without complications: Secondary | ICD-10-CM | POA: Diagnosis not present

## 2022-10-11 DIAGNOSIS — F32A Depression, unspecified: Secondary | ICD-10-CM | POA: Diagnosis not present

## 2022-10-11 DIAGNOSIS — Z7984 Long term (current) use of oral hypoglycemic drugs: Secondary | ICD-10-CM | POA: Diagnosis not present

## 2022-10-11 DIAGNOSIS — F419 Anxiety disorder, unspecified: Secondary | ICD-10-CM | POA: Diagnosis not present

## 2022-10-11 DIAGNOSIS — I10 Essential (primary) hypertension: Secondary | ICD-10-CM | POA: Diagnosis not present

## 2022-10-11 DIAGNOSIS — R5382 Chronic fatigue, unspecified: Secondary | ICD-10-CM | POA: Diagnosis not present

## 2022-10-11 DIAGNOSIS — E119 Type 2 diabetes mellitus without complications: Secondary | ICD-10-CM | POA: Diagnosis not present

## 2022-10-13 DIAGNOSIS — R338 Other retention of urine: Secondary | ICD-10-CM | POA: Diagnosis not present

## 2022-10-15 DIAGNOSIS — E119 Type 2 diabetes mellitus without complications: Secondary | ICD-10-CM | POA: Diagnosis not present

## 2022-10-15 DIAGNOSIS — F32A Depression, unspecified: Secondary | ICD-10-CM | POA: Diagnosis not present

## 2022-10-15 DIAGNOSIS — F419 Anxiety disorder, unspecified: Secondary | ICD-10-CM | POA: Diagnosis not present

## 2022-10-15 DIAGNOSIS — R5382 Chronic fatigue, unspecified: Secondary | ICD-10-CM | POA: Diagnosis not present

## 2022-10-15 DIAGNOSIS — I10 Essential (primary) hypertension: Secondary | ICD-10-CM | POA: Diagnosis not present

## 2022-10-15 DIAGNOSIS — Z7984 Long term (current) use of oral hypoglycemic drugs: Secondary | ICD-10-CM | POA: Diagnosis not present

## 2022-10-18 DIAGNOSIS — E119 Type 2 diabetes mellitus without complications: Secondary | ICD-10-CM | POA: Diagnosis not present

## 2022-10-18 DIAGNOSIS — R5382 Chronic fatigue, unspecified: Secondary | ICD-10-CM | POA: Diagnosis not present

## 2022-10-18 DIAGNOSIS — I10 Essential (primary) hypertension: Secondary | ICD-10-CM | POA: Diagnosis not present

## 2022-10-18 DIAGNOSIS — F419 Anxiety disorder, unspecified: Secondary | ICD-10-CM | POA: Diagnosis not present

## 2022-10-18 DIAGNOSIS — Z7984 Long term (current) use of oral hypoglycemic drugs: Secondary | ICD-10-CM | POA: Diagnosis not present

## 2022-10-18 DIAGNOSIS — F32A Depression, unspecified: Secondary | ICD-10-CM | POA: Diagnosis not present

## 2022-10-21 DIAGNOSIS — M1990 Unspecified osteoarthritis, unspecified site: Secondary | ICD-10-CM | POA: Diagnosis not present

## 2022-10-21 DIAGNOSIS — I10 Essential (primary) hypertension: Secondary | ICD-10-CM | POA: Diagnosis not present

## 2022-10-21 DIAGNOSIS — E1165 Type 2 diabetes mellitus with hyperglycemia: Secondary | ICD-10-CM | POA: Diagnosis not present

## 2022-10-21 DIAGNOSIS — R5382 Chronic fatigue, unspecified: Secondary | ICD-10-CM | POA: Diagnosis not present

## 2022-10-21 DIAGNOSIS — Z7189 Other specified counseling: Secondary | ICD-10-CM | POA: Diagnosis not present

## 2022-10-21 DIAGNOSIS — E785 Hyperlipidemia, unspecified: Secondary | ICD-10-CM | POA: Diagnosis not present

## 2022-10-21 DIAGNOSIS — Z466 Encounter for fitting and adjustment of urinary device: Secondary | ICD-10-CM | POA: Diagnosis not present

## 2022-10-21 DIAGNOSIS — R0781 Pleurodynia: Secondary | ICD-10-CM | POA: Diagnosis not present

## 2022-10-21 DIAGNOSIS — F32A Depression, unspecified: Secondary | ICD-10-CM | POA: Diagnosis not present

## 2022-10-21 DIAGNOSIS — K219 Gastro-esophageal reflux disease without esophagitis: Secondary | ICD-10-CM | POA: Diagnosis not present

## 2022-10-21 DIAGNOSIS — Z8701 Personal history of pneumonia (recurrent): Secondary | ICD-10-CM | POA: Diagnosis not present

## 2022-10-21 DIAGNOSIS — Z7984 Long term (current) use of oral hypoglycemic drugs: Secondary | ICD-10-CM | POA: Diagnosis not present

## 2022-10-21 DIAGNOSIS — F419 Anxiety disorder, unspecified: Secondary | ICD-10-CM | POA: Diagnosis not present

## 2022-10-21 DIAGNOSIS — E119 Type 2 diabetes mellitus without complications: Secondary | ICD-10-CM | POA: Diagnosis not present

## 2022-10-21 DIAGNOSIS — E039 Hypothyroidism, unspecified: Secondary | ICD-10-CM | POA: Diagnosis not present

## 2022-10-26 DIAGNOSIS — E119 Type 2 diabetes mellitus without complications: Secondary | ICD-10-CM | POA: Diagnosis not present

## 2022-10-26 DIAGNOSIS — F32A Depression, unspecified: Secondary | ICD-10-CM | POA: Diagnosis not present

## 2022-10-26 DIAGNOSIS — F419 Anxiety disorder, unspecified: Secondary | ICD-10-CM | POA: Diagnosis not present

## 2022-10-26 DIAGNOSIS — R5382 Chronic fatigue, unspecified: Secondary | ICD-10-CM | POA: Diagnosis not present

## 2022-10-26 DIAGNOSIS — I10 Essential (primary) hypertension: Secondary | ICD-10-CM | POA: Diagnosis not present

## 2022-10-26 DIAGNOSIS — Z7984 Long term (current) use of oral hypoglycemic drugs: Secondary | ICD-10-CM | POA: Diagnosis not present

## 2022-11-03 DIAGNOSIS — F419 Anxiety disorder, unspecified: Secondary | ICD-10-CM | POA: Diagnosis not present

## 2022-11-03 DIAGNOSIS — F32A Depression, unspecified: Secondary | ICD-10-CM | POA: Diagnosis not present

## 2022-11-03 DIAGNOSIS — I10 Essential (primary) hypertension: Secondary | ICD-10-CM | POA: Diagnosis not present

## 2022-11-03 DIAGNOSIS — R5382 Chronic fatigue, unspecified: Secondary | ICD-10-CM | POA: Diagnosis not present

## 2022-11-03 DIAGNOSIS — E119 Type 2 diabetes mellitus without complications: Secondary | ICD-10-CM | POA: Diagnosis not present

## 2022-11-03 DIAGNOSIS — Z7984 Long term (current) use of oral hypoglycemic drugs: Secondary | ICD-10-CM | POA: Diagnosis not present

## 2022-11-07 DIAGNOSIS — G311 Senile degeneration of brain, not elsewhere classified: Secondary | ICD-10-CM | POA: Diagnosis not present

## 2022-11-07 DIAGNOSIS — E119 Type 2 diabetes mellitus without complications: Secondary | ICD-10-CM | POA: Diagnosis not present

## 2022-11-07 DIAGNOSIS — M4302 Spondylolysis, cervical region: Secondary | ICD-10-CM | POA: Diagnosis not present

## 2022-11-07 DIAGNOSIS — M1990 Unspecified osteoarthritis, unspecified site: Secondary | ICD-10-CM | POA: Diagnosis not present

## 2022-11-07 DIAGNOSIS — I471 Supraventricular tachycardia, unspecified: Secondary | ICD-10-CM | POA: Diagnosis not present

## 2022-11-07 DIAGNOSIS — R630 Anorexia: Secondary | ICD-10-CM | POA: Diagnosis not present

## 2022-11-07 DIAGNOSIS — E785 Hyperlipidemia, unspecified: Secondary | ICD-10-CM | POA: Diagnosis not present

## 2022-11-07 DIAGNOSIS — F02818 Dementia in other diseases classified elsewhere, unspecified severity, with other behavioral disturbance: Secondary | ICD-10-CM | POA: Diagnosis not present

## 2022-11-07 DIAGNOSIS — K529 Noninfective gastroenteritis and colitis, unspecified: Secondary | ICD-10-CM | POA: Diagnosis not present

## 2022-11-07 DIAGNOSIS — E039 Hypothyroidism, unspecified: Secondary | ICD-10-CM | POA: Diagnosis not present

## 2022-11-07 DIAGNOSIS — I10 Essential (primary) hypertension: Secondary | ICD-10-CM | POA: Diagnosis not present

## 2022-11-08 DIAGNOSIS — R630 Anorexia: Secondary | ICD-10-CM | POA: Diagnosis not present

## 2022-11-08 DIAGNOSIS — E119 Type 2 diabetes mellitus without complications: Secondary | ICD-10-CM | POA: Diagnosis not present

## 2022-11-08 DIAGNOSIS — M4302 Spondylolysis, cervical region: Secondary | ICD-10-CM | POA: Diagnosis not present

## 2022-11-08 DIAGNOSIS — F02818 Dementia in other diseases classified elsewhere, unspecified severity, with other behavioral disturbance: Secondary | ICD-10-CM | POA: Diagnosis not present

## 2022-11-08 DIAGNOSIS — K529 Noninfective gastroenteritis and colitis, unspecified: Secondary | ICD-10-CM | POA: Diagnosis not present

## 2022-11-08 DIAGNOSIS — G311 Senile degeneration of brain, not elsewhere classified: Secondary | ICD-10-CM | POA: Diagnosis not present

## 2022-11-09 DIAGNOSIS — E119 Type 2 diabetes mellitus without complications: Secondary | ICD-10-CM | POA: Diagnosis not present

## 2022-11-09 DIAGNOSIS — R630 Anorexia: Secondary | ICD-10-CM | POA: Diagnosis not present

## 2022-11-09 DIAGNOSIS — F02818 Dementia in other diseases classified elsewhere, unspecified severity, with other behavioral disturbance: Secondary | ICD-10-CM | POA: Diagnosis not present

## 2022-11-09 DIAGNOSIS — G311 Senile degeneration of brain, not elsewhere classified: Secondary | ICD-10-CM | POA: Diagnosis not present

## 2022-11-09 DIAGNOSIS — M4302 Spondylolysis, cervical region: Secondary | ICD-10-CM | POA: Diagnosis not present

## 2022-11-09 DIAGNOSIS — K529 Noninfective gastroenteritis and colitis, unspecified: Secondary | ICD-10-CM | POA: Diagnosis not present

## 2022-11-15 DIAGNOSIS — F02818 Dementia in other diseases classified elsewhere, unspecified severity, with other behavioral disturbance: Secondary | ICD-10-CM | POA: Diagnosis not present

## 2022-11-15 DIAGNOSIS — R630 Anorexia: Secondary | ICD-10-CM | POA: Diagnosis not present

## 2022-11-15 DIAGNOSIS — M4302 Spondylolysis, cervical region: Secondary | ICD-10-CM | POA: Diagnosis not present

## 2022-11-15 DIAGNOSIS — G311 Senile degeneration of brain, not elsewhere classified: Secondary | ICD-10-CM | POA: Diagnosis not present

## 2022-11-15 DIAGNOSIS — E119 Type 2 diabetes mellitus without complications: Secondary | ICD-10-CM | POA: Diagnosis not present

## 2022-11-15 DIAGNOSIS — K529 Noninfective gastroenteritis and colitis, unspecified: Secondary | ICD-10-CM | POA: Diagnosis not present

## 2022-11-18 DIAGNOSIS — E119 Type 2 diabetes mellitus without complications: Secondary | ICD-10-CM | POA: Diagnosis not present

## 2022-11-18 DIAGNOSIS — G311 Senile degeneration of brain, not elsewhere classified: Secondary | ICD-10-CM | POA: Diagnosis not present

## 2022-11-18 DIAGNOSIS — M1990 Unspecified osteoarthritis, unspecified site: Secondary | ICD-10-CM | POA: Diagnosis not present

## 2022-11-18 DIAGNOSIS — R630 Anorexia: Secondary | ICD-10-CM | POA: Diagnosis not present

## 2022-11-18 DIAGNOSIS — F02818 Dementia in other diseases classified elsewhere, unspecified severity, with other behavioral disturbance: Secondary | ICD-10-CM | POA: Diagnosis not present

## 2022-11-18 DIAGNOSIS — M4302 Spondylolysis, cervical region: Secondary | ICD-10-CM | POA: Diagnosis not present

## 2022-11-18 DIAGNOSIS — I471 Supraventricular tachycardia, unspecified: Secondary | ICD-10-CM | POA: Diagnosis not present

## 2022-11-18 DIAGNOSIS — K529 Noninfective gastroenteritis and colitis, unspecified: Secondary | ICD-10-CM | POA: Diagnosis not present

## 2022-11-18 DIAGNOSIS — E039 Hypothyroidism, unspecified: Secondary | ICD-10-CM | POA: Diagnosis not present

## 2022-11-18 DIAGNOSIS — E785 Hyperlipidemia, unspecified: Secondary | ICD-10-CM | POA: Diagnosis not present

## 2022-11-18 DIAGNOSIS — I10 Essential (primary) hypertension: Secondary | ICD-10-CM | POA: Diagnosis not present

## 2022-11-22 DIAGNOSIS — E119 Type 2 diabetes mellitus without complications: Secondary | ICD-10-CM | POA: Diagnosis not present

## 2022-11-22 DIAGNOSIS — G311 Senile degeneration of brain, not elsewhere classified: Secondary | ICD-10-CM | POA: Diagnosis not present

## 2022-11-22 DIAGNOSIS — K529 Noninfective gastroenteritis and colitis, unspecified: Secondary | ICD-10-CM | POA: Diagnosis not present

## 2022-11-22 DIAGNOSIS — M4302 Spondylolysis, cervical region: Secondary | ICD-10-CM | POA: Diagnosis not present

## 2022-11-22 DIAGNOSIS — F02818 Dementia in other diseases classified elsewhere, unspecified severity, with other behavioral disturbance: Secondary | ICD-10-CM | POA: Diagnosis not present

## 2022-11-22 DIAGNOSIS — R630 Anorexia: Secondary | ICD-10-CM | POA: Diagnosis not present

## 2022-11-29 DIAGNOSIS — K529 Noninfective gastroenteritis and colitis, unspecified: Secondary | ICD-10-CM | POA: Diagnosis not present

## 2022-11-29 DIAGNOSIS — M4302 Spondylolysis, cervical region: Secondary | ICD-10-CM | POA: Diagnosis not present

## 2022-11-29 DIAGNOSIS — E119 Type 2 diabetes mellitus without complications: Secondary | ICD-10-CM | POA: Diagnosis not present

## 2022-11-29 DIAGNOSIS — G311 Senile degeneration of brain, not elsewhere classified: Secondary | ICD-10-CM | POA: Diagnosis not present

## 2022-11-29 DIAGNOSIS — F02818 Dementia in other diseases classified elsewhere, unspecified severity, with other behavioral disturbance: Secondary | ICD-10-CM | POA: Diagnosis not present

## 2022-11-29 DIAGNOSIS — R630 Anorexia: Secondary | ICD-10-CM | POA: Diagnosis not present

## 2022-12-06 DIAGNOSIS — K529 Noninfective gastroenteritis and colitis, unspecified: Secondary | ICD-10-CM | POA: Diagnosis not present

## 2022-12-06 DIAGNOSIS — F02818 Dementia in other diseases classified elsewhere, unspecified severity, with other behavioral disturbance: Secondary | ICD-10-CM | POA: Diagnosis not present

## 2022-12-06 DIAGNOSIS — M4302 Spondylolysis, cervical region: Secondary | ICD-10-CM | POA: Diagnosis not present

## 2022-12-06 DIAGNOSIS — R630 Anorexia: Secondary | ICD-10-CM | POA: Diagnosis not present

## 2022-12-06 DIAGNOSIS — G311 Senile degeneration of brain, not elsewhere classified: Secondary | ICD-10-CM | POA: Diagnosis not present

## 2022-12-06 DIAGNOSIS — E119 Type 2 diabetes mellitus without complications: Secondary | ICD-10-CM | POA: Diagnosis not present

## 2022-12-13 DIAGNOSIS — F02818 Dementia in other diseases classified elsewhere, unspecified severity, with other behavioral disturbance: Secondary | ICD-10-CM | POA: Diagnosis not present

## 2022-12-13 DIAGNOSIS — E119 Type 2 diabetes mellitus without complications: Secondary | ICD-10-CM | POA: Diagnosis not present

## 2022-12-13 DIAGNOSIS — K529 Noninfective gastroenteritis and colitis, unspecified: Secondary | ICD-10-CM | POA: Diagnosis not present

## 2022-12-13 DIAGNOSIS — R630 Anorexia: Secondary | ICD-10-CM | POA: Diagnosis not present

## 2022-12-13 DIAGNOSIS — M4302 Spondylolysis, cervical region: Secondary | ICD-10-CM | POA: Diagnosis not present

## 2022-12-13 DIAGNOSIS — G311 Senile degeneration of brain, not elsewhere classified: Secondary | ICD-10-CM | POA: Diagnosis not present

## 2022-12-17 DIAGNOSIS — E119 Type 2 diabetes mellitus without complications: Secondary | ICD-10-CM | POA: Diagnosis not present

## 2022-12-17 DIAGNOSIS — R630 Anorexia: Secondary | ICD-10-CM | POA: Diagnosis not present

## 2022-12-17 DIAGNOSIS — G311 Senile degeneration of brain, not elsewhere classified: Secondary | ICD-10-CM | POA: Diagnosis not present

## 2022-12-17 DIAGNOSIS — M4302 Spondylolysis, cervical region: Secondary | ICD-10-CM | POA: Diagnosis not present

## 2022-12-17 DIAGNOSIS — F02818 Dementia in other diseases classified elsewhere, unspecified severity, with other behavioral disturbance: Secondary | ICD-10-CM | POA: Diagnosis not present

## 2022-12-17 DIAGNOSIS — K529 Noninfective gastroenteritis and colitis, unspecified: Secondary | ICD-10-CM | POA: Diagnosis not present

## 2022-12-19 DIAGNOSIS — E119 Type 2 diabetes mellitus without complications: Secondary | ICD-10-CM | POA: Diagnosis not present

## 2022-12-19 DIAGNOSIS — K529 Noninfective gastroenteritis and colitis, unspecified: Secondary | ICD-10-CM | POA: Diagnosis not present

## 2022-12-19 DIAGNOSIS — E039 Hypothyroidism, unspecified: Secondary | ICD-10-CM | POA: Diagnosis not present

## 2022-12-19 DIAGNOSIS — M4302 Spondylolysis, cervical region: Secondary | ICD-10-CM | POA: Diagnosis not present

## 2022-12-19 DIAGNOSIS — E785 Hyperlipidemia, unspecified: Secondary | ICD-10-CM | POA: Diagnosis not present

## 2022-12-19 DIAGNOSIS — I471 Supraventricular tachycardia, unspecified: Secondary | ICD-10-CM | POA: Diagnosis not present

## 2022-12-19 DIAGNOSIS — I10 Essential (primary) hypertension: Secondary | ICD-10-CM | POA: Diagnosis not present

## 2022-12-19 DIAGNOSIS — F02818 Dementia in other diseases classified elsewhere, unspecified severity, with other behavioral disturbance: Secondary | ICD-10-CM | POA: Diagnosis not present

## 2022-12-19 DIAGNOSIS — M1990 Unspecified osteoarthritis, unspecified site: Secondary | ICD-10-CM | POA: Diagnosis not present

## 2022-12-19 DIAGNOSIS — R630 Anorexia: Secondary | ICD-10-CM | POA: Diagnosis not present

## 2022-12-19 DIAGNOSIS — G311 Senile degeneration of brain, not elsewhere classified: Secondary | ICD-10-CM | POA: Diagnosis not present

## 2022-12-20 DIAGNOSIS — R630 Anorexia: Secondary | ICD-10-CM | POA: Diagnosis not present

## 2022-12-20 DIAGNOSIS — F02818 Dementia in other diseases classified elsewhere, unspecified severity, with other behavioral disturbance: Secondary | ICD-10-CM | POA: Diagnosis not present

## 2022-12-20 DIAGNOSIS — K529 Noninfective gastroenteritis and colitis, unspecified: Secondary | ICD-10-CM | POA: Diagnosis not present

## 2022-12-20 DIAGNOSIS — E119 Type 2 diabetes mellitus without complications: Secondary | ICD-10-CM | POA: Diagnosis not present

## 2022-12-20 DIAGNOSIS — G311 Senile degeneration of brain, not elsewhere classified: Secondary | ICD-10-CM | POA: Diagnosis not present

## 2022-12-20 DIAGNOSIS — M4302 Spondylolysis, cervical region: Secondary | ICD-10-CM | POA: Diagnosis not present

## 2022-12-21 DIAGNOSIS — E119 Type 2 diabetes mellitus without complications: Secondary | ICD-10-CM | POA: Diagnosis not present

## 2022-12-21 DIAGNOSIS — R630 Anorexia: Secondary | ICD-10-CM | POA: Diagnosis not present

## 2022-12-21 DIAGNOSIS — M4302 Spondylolysis, cervical region: Secondary | ICD-10-CM | POA: Diagnosis not present

## 2022-12-21 DIAGNOSIS — F02818 Dementia in other diseases classified elsewhere, unspecified severity, with other behavioral disturbance: Secondary | ICD-10-CM | POA: Diagnosis not present

## 2022-12-21 DIAGNOSIS — G311 Senile degeneration of brain, not elsewhere classified: Secondary | ICD-10-CM | POA: Diagnosis not present

## 2022-12-21 DIAGNOSIS — K529 Noninfective gastroenteritis and colitis, unspecified: Secondary | ICD-10-CM | POA: Diagnosis not present

## 2022-12-22 DIAGNOSIS — R630 Anorexia: Secondary | ICD-10-CM | POA: Diagnosis not present

## 2022-12-22 DIAGNOSIS — F02818 Dementia in other diseases classified elsewhere, unspecified severity, with other behavioral disturbance: Secondary | ICD-10-CM | POA: Diagnosis not present

## 2022-12-22 DIAGNOSIS — K529 Noninfective gastroenteritis and colitis, unspecified: Secondary | ICD-10-CM | POA: Diagnosis not present

## 2022-12-22 DIAGNOSIS — M4302 Spondylolysis, cervical region: Secondary | ICD-10-CM | POA: Diagnosis not present

## 2022-12-22 DIAGNOSIS — E119 Type 2 diabetes mellitus without complications: Secondary | ICD-10-CM | POA: Diagnosis not present

## 2022-12-22 DIAGNOSIS — G311 Senile degeneration of brain, not elsewhere classified: Secondary | ICD-10-CM | POA: Diagnosis not present

## 2022-12-23 DIAGNOSIS — K529 Noninfective gastroenteritis and colitis, unspecified: Secondary | ICD-10-CM | POA: Diagnosis not present

## 2022-12-23 DIAGNOSIS — E119 Type 2 diabetes mellitus without complications: Secondary | ICD-10-CM | POA: Diagnosis not present

## 2022-12-23 DIAGNOSIS — R630 Anorexia: Secondary | ICD-10-CM | POA: Diagnosis not present

## 2022-12-23 DIAGNOSIS — M4302 Spondylolysis, cervical region: Secondary | ICD-10-CM | POA: Diagnosis not present

## 2022-12-23 DIAGNOSIS — G311 Senile degeneration of brain, not elsewhere classified: Secondary | ICD-10-CM | POA: Diagnosis not present

## 2022-12-23 DIAGNOSIS — F02818 Dementia in other diseases classified elsewhere, unspecified severity, with other behavioral disturbance: Secondary | ICD-10-CM | POA: Diagnosis not present

## 2022-12-24 DIAGNOSIS — F02818 Dementia in other diseases classified elsewhere, unspecified severity, with other behavioral disturbance: Secondary | ICD-10-CM | POA: Diagnosis not present

## 2022-12-24 DIAGNOSIS — E119 Type 2 diabetes mellitus without complications: Secondary | ICD-10-CM | POA: Diagnosis not present

## 2022-12-24 DIAGNOSIS — K529 Noninfective gastroenteritis and colitis, unspecified: Secondary | ICD-10-CM | POA: Diagnosis not present

## 2022-12-24 DIAGNOSIS — R630 Anorexia: Secondary | ICD-10-CM | POA: Diagnosis not present

## 2022-12-24 DIAGNOSIS — G311 Senile degeneration of brain, not elsewhere classified: Secondary | ICD-10-CM | POA: Diagnosis not present

## 2022-12-24 DIAGNOSIS — M4302 Spondylolysis, cervical region: Secondary | ICD-10-CM | POA: Diagnosis not present

## 2022-12-25 DIAGNOSIS — G311 Senile degeneration of brain, not elsewhere classified: Secondary | ICD-10-CM | POA: Diagnosis not present

## 2022-12-25 DIAGNOSIS — M4302 Spondylolysis, cervical region: Secondary | ICD-10-CM | POA: Diagnosis not present

## 2022-12-25 DIAGNOSIS — E119 Type 2 diabetes mellitus without complications: Secondary | ICD-10-CM | POA: Diagnosis not present

## 2022-12-25 DIAGNOSIS — K529 Noninfective gastroenteritis and colitis, unspecified: Secondary | ICD-10-CM | POA: Diagnosis not present

## 2022-12-25 DIAGNOSIS — R630 Anorexia: Secondary | ICD-10-CM | POA: Diagnosis not present

## 2022-12-25 DIAGNOSIS — F02818 Dementia in other diseases classified elsewhere, unspecified severity, with other behavioral disturbance: Secondary | ICD-10-CM | POA: Diagnosis not present

## 2022-12-26 DIAGNOSIS — K529 Noninfective gastroenteritis and colitis, unspecified: Secondary | ICD-10-CM | POA: Diagnosis not present

## 2022-12-26 DIAGNOSIS — G311 Senile degeneration of brain, not elsewhere classified: Secondary | ICD-10-CM | POA: Diagnosis not present

## 2022-12-26 DIAGNOSIS — R630 Anorexia: Secondary | ICD-10-CM | POA: Diagnosis not present

## 2022-12-26 DIAGNOSIS — M4302 Spondylolysis, cervical region: Secondary | ICD-10-CM | POA: Diagnosis not present

## 2022-12-26 DIAGNOSIS — E119 Type 2 diabetes mellitus without complications: Secondary | ICD-10-CM | POA: Diagnosis not present

## 2022-12-26 DIAGNOSIS — F02818 Dementia in other diseases classified elsewhere, unspecified severity, with other behavioral disturbance: Secondary | ICD-10-CM | POA: Diagnosis not present

## 2022-12-27 DIAGNOSIS — G311 Senile degeneration of brain, not elsewhere classified: Secondary | ICD-10-CM | POA: Diagnosis not present

## 2022-12-27 DIAGNOSIS — E119 Type 2 diabetes mellitus without complications: Secondary | ICD-10-CM | POA: Diagnosis not present

## 2022-12-27 DIAGNOSIS — R630 Anorexia: Secondary | ICD-10-CM | POA: Diagnosis not present

## 2022-12-27 DIAGNOSIS — K529 Noninfective gastroenteritis and colitis, unspecified: Secondary | ICD-10-CM | POA: Diagnosis not present

## 2022-12-27 DIAGNOSIS — M4302 Spondylolysis, cervical region: Secondary | ICD-10-CM | POA: Diagnosis not present

## 2022-12-27 DIAGNOSIS — F02818 Dementia in other diseases classified elsewhere, unspecified severity, with other behavioral disturbance: Secondary | ICD-10-CM | POA: Diagnosis not present

## 2022-12-28 DIAGNOSIS — R630 Anorexia: Secondary | ICD-10-CM | POA: Diagnosis not present

## 2022-12-28 DIAGNOSIS — K529 Noninfective gastroenteritis and colitis, unspecified: Secondary | ICD-10-CM | POA: Diagnosis not present

## 2022-12-28 DIAGNOSIS — G311 Senile degeneration of brain, not elsewhere classified: Secondary | ICD-10-CM | POA: Diagnosis not present

## 2022-12-28 DIAGNOSIS — E119 Type 2 diabetes mellitus without complications: Secondary | ICD-10-CM | POA: Diagnosis not present

## 2022-12-28 DIAGNOSIS — F02818 Dementia in other diseases classified elsewhere, unspecified severity, with other behavioral disturbance: Secondary | ICD-10-CM | POA: Diagnosis not present

## 2022-12-28 DIAGNOSIS — M4302 Spondylolysis, cervical region: Secondary | ICD-10-CM | POA: Diagnosis not present

## 2022-12-29 DIAGNOSIS — R630 Anorexia: Secondary | ICD-10-CM | POA: Diagnosis not present

## 2022-12-29 DIAGNOSIS — K529 Noninfective gastroenteritis and colitis, unspecified: Secondary | ICD-10-CM | POA: Diagnosis not present

## 2022-12-29 DIAGNOSIS — E119 Type 2 diabetes mellitus without complications: Secondary | ICD-10-CM | POA: Diagnosis not present

## 2022-12-29 DIAGNOSIS — G311 Senile degeneration of brain, not elsewhere classified: Secondary | ICD-10-CM | POA: Diagnosis not present

## 2022-12-29 DIAGNOSIS — F02818 Dementia in other diseases classified elsewhere, unspecified severity, with other behavioral disturbance: Secondary | ICD-10-CM | POA: Diagnosis not present

## 2022-12-29 DIAGNOSIS — M4302 Spondylolysis, cervical region: Secondary | ICD-10-CM | POA: Diagnosis not present

## 2022-12-30 DIAGNOSIS — M4302 Spondylolysis, cervical region: Secondary | ICD-10-CM | POA: Diagnosis not present

## 2022-12-30 DIAGNOSIS — E119 Type 2 diabetes mellitus without complications: Secondary | ICD-10-CM | POA: Diagnosis not present

## 2022-12-30 DIAGNOSIS — R630 Anorexia: Secondary | ICD-10-CM | POA: Diagnosis not present

## 2022-12-30 DIAGNOSIS — F02818 Dementia in other diseases classified elsewhere, unspecified severity, with other behavioral disturbance: Secondary | ICD-10-CM | POA: Diagnosis not present

## 2022-12-30 DIAGNOSIS — G311 Senile degeneration of brain, not elsewhere classified: Secondary | ICD-10-CM | POA: Diagnosis not present

## 2022-12-30 DIAGNOSIS — K529 Noninfective gastroenteritis and colitis, unspecified: Secondary | ICD-10-CM | POA: Diagnosis not present

## 2022-12-31 DIAGNOSIS — R630 Anorexia: Secondary | ICD-10-CM | POA: Diagnosis not present

## 2022-12-31 DIAGNOSIS — F02818 Dementia in other diseases classified elsewhere, unspecified severity, with other behavioral disturbance: Secondary | ICD-10-CM | POA: Diagnosis not present

## 2022-12-31 DIAGNOSIS — G311 Senile degeneration of brain, not elsewhere classified: Secondary | ICD-10-CM | POA: Diagnosis not present

## 2022-12-31 DIAGNOSIS — E119 Type 2 diabetes mellitus without complications: Secondary | ICD-10-CM | POA: Diagnosis not present

## 2022-12-31 DIAGNOSIS — M4302 Spondylolysis, cervical region: Secondary | ICD-10-CM | POA: Diagnosis not present

## 2022-12-31 DIAGNOSIS — K529 Noninfective gastroenteritis and colitis, unspecified: Secondary | ICD-10-CM | POA: Diagnosis not present

## 2023-01-01 DIAGNOSIS — F02818 Dementia in other diseases classified elsewhere, unspecified severity, with other behavioral disturbance: Secondary | ICD-10-CM | POA: Diagnosis not present

## 2023-01-01 DIAGNOSIS — M4302 Spondylolysis, cervical region: Secondary | ICD-10-CM | POA: Diagnosis not present

## 2023-01-01 DIAGNOSIS — R630 Anorexia: Secondary | ICD-10-CM | POA: Diagnosis not present

## 2023-01-01 DIAGNOSIS — G311 Senile degeneration of brain, not elsewhere classified: Secondary | ICD-10-CM | POA: Diagnosis not present

## 2023-01-01 DIAGNOSIS — K529 Noninfective gastroenteritis and colitis, unspecified: Secondary | ICD-10-CM | POA: Diagnosis not present

## 2023-01-01 DIAGNOSIS — E119 Type 2 diabetes mellitus without complications: Secondary | ICD-10-CM | POA: Diagnosis not present

## 2023-01-02 DIAGNOSIS — M4302 Spondylolysis, cervical region: Secondary | ICD-10-CM | POA: Diagnosis not present

## 2023-01-02 DIAGNOSIS — R630 Anorexia: Secondary | ICD-10-CM | POA: Diagnosis not present

## 2023-01-02 DIAGNOSIS — G311 Senile degeneration of brain, not elsewhere classified: Secondary | ICD-10-CM | POA: Diagnosis not present

## 2023-01-02 DIAGNOSIS — F02818 Dementia in other diseases classified elsewhere, unspecified severity, with other behavioral disturbance: Secondary | ICD-10-CM | POA: Diagnosis not present

## 2023-01-02 DIAGNOSIS — K529 Noninfective gastroenteritis and colitis, unspecified: Secondary | ICD-10-CM | POA: Diagnosis not present

## 2023-01-02 DIAGNOSIS — E119 Type 2 diabetes mellitus without complications: Secondary | ICD-10-CM | POA: Diagnosis not present

## 2023-01-03 DIAGNOSIS — R630 Anorexia: Secondary | ICD-10-CM | POA: Diagnosis not present

## 2023-01-03 DIAGNOSIS — M4302 Spondylolysis, cervical region: Secondary | ICD-10-CM | POA: Diagnosis not present

## 2023-01-03 DIAGNOSIS — E119 Type 2 diabetes mellitus without complications: Secondary | ICD-10-CM | POA: Diagnosis not present

## 2023-01-03 DIAGNOSIS — F02818 Dementia in other diseases classified elsewhere, unspecified severity, with other behavioral disturbance: Secondary | ICD-10-CM | POA: Diagnosis not present

## 2023-01-03 DIAGNOSIS — G311 Senile degeneration of brain, not elsewhere classified: Secondary | ICD-10-CM | POA: Diagnosis not present

## 2023-01-03 DIAGNOSIS — K529 Noninfective gastroenteritis and colitis, unspecified: Secondary | ICD-10-CM | POA: Diagnosis not present

## 2023-01-04 DIAGNOSIS — G311 Senile degeneration of brain, not elsewhere classified: Secondary | ICD-10-CM | POA: Diagnosis not present

## 2023-01-04 DIAGNOSIS — F02818 Dementia in other diseases classified elsewhere, unspecified severity, with other behavioral disturbance: Secondary | ICD-10-CM | POA: Diagnosis not present

## 2023-01-04 DIAGNOSIS — E119 Type 2 diabetes mellitus without complications: Secondary | ICD-10-CM | POA: Diagnosis not present

## 2023-01-04 DIAGNOSIS — M4302 Spondylolysis, cervical region: Secondary | ICD-10-CM | POA: Diagnosis not present

## 2023-01-04 DIAGNOSIS — R630 Anorexia: Secondary | ICD-10-CM | POA: Diagnosis not present

## 2023-01-04 DIAGNOSIS — K529 Noninfective gastroenteritis and colitis, unspecified: Secondary | ICD-10-CM | POA: Diagnosis not present

## 2023-01-05 DIAGNOSIS — E119 Type 2 diabetes mellitus without complications: Secondary | ICD-10-CM | POA: Diagnosis not present

## 2023-01-05 DIAGNOSIS — M4302 Spondylolysis, cervical region: Secondary | ICD-10-CM | POA: Diagnosis not present

## 2023-01-05 DIAGNOSIS — R630 Anorexia: Secondary | ICD-10-CM | POA: Diagnosis not present

## 2023-01-05 DIAGNOSIS — G311 Senile degeneration of brain, not elsewhere classified: Secondary | ICD-10-CM | POA: Diagnosis not present

## 2023-01-05 DIAGNOSIS — K529 Noninfective gastroenteritis and colitis, unspecified: Secondary | ICD-10-CM | POA: Diagnosis not present

## 2023-01-05 DIAGNOSIS — F02818 Dementia in other diseases classified elsewhere, unspecified severity, with other behavioral disturbance: Secondary | ICD-10-CM | POA: Diagnosis not present

## 2023-01-06 DIAGNOSIS — F02818 Dementia in other diseases classified elsewhere, unspecified severity, with other behavioral disturbance: Secondary | ICD-10-CM | POA: Diagnosis not present

## 2023-01-06 DIAGNOSIS — M4302 Spondylolysis, cervical region: Secondary | ICD-10-CM | POA: Diagnosis not present

## 2023-01-06 DIAGNOSIS — R630 Anorexia: Secondary | ICD-10-CM | POA: Diagnosis not present

## 2023-01-06 DIAGNOSIS — E119 Type 2 diabetes mellitus without complications: Secondary | ICD-10-CM | POA: Diagnosis not present

## 2023-01-06 DIAGNOSIS — G311 Senile degeneration of brain, not elsewhere classified: Secondary | ICD-10-CM | POA: Diagnosis not present

## 2023-01-06 DIAGNOSIS — K529 Noninfective gastroenteritis and colitis, unspecified: Secondary | ICD-10-CM | POA: Diagnosis not present

## 2023-01-07 DIAGNOSIS — F02818 Dementia in other diseases classified elsewhere, unspecified severity, with other behavioral disturbance: Secondary | ICD-10-CM | POA: Diagnosis not present

## 2023-01-07 DIAGNOSIS — E119 Type 2 diabetes mellitus without complications: Secondary | ICD-10-CM | POA: Diagnosis not present

## 2023-01-07 DIAGNOSIS — G311 Senile degeneration of brain, not elsewhere classified: Secondary | ICD-10-CM | POA: Diagnosis not present

## 2023-01-07 DIAGNOSIS — K529 Noninfective gastroenteritis and colitis, unspecified: Secondary | ICD-10-CM | POA: Diagnosis not present

## 2023-01-07 DIAGNOSIS — M4302 Spondylolysis, cervical region: Secondary | ICD-10-CM | POA: Diagnosis not present

## 2023-01-07 DIAGNOSIS — R630 Anorexia: Secondary | ICD-10-CM | POA: Diagnosis not present

## 2023-01-08 DIAGNOSIS — M4302 Spondylolysis, cervical region: Secondary | ICD-10-CM | POA: Diagnosis not present

## 2023-01-08 DIAGNOSIS — E119 Type 2 diabetes mellitus without complications: Secondary | ICD-10-CM | POA: Diagnosis not present

## 2023-01-08 DIAGNOSIS — G311 Senile degeneration of brain, not elsewhere classified: Secondary | ICD-10-CM | POA: Diagnosis not present

## 2023-01-08 DIAGNOSIS — R630 Anorexia: Secondary | ICD-10-CM | POA: Diagnosis not present

## 2023-01-08 DIAGNOSIS — K529 Noninfective gastroenteritis and colitis, unspecified: Secondary | ICD-10-CM | POA: Diagnosis not present

## 2023-01-08 DIAGNOSIS — F02818 Dementia in other diseases classified elsewhere, unspecified severity, with other behavioral disturbance: Secondary | ICD-10-CM | POA: Diagnosis not present

## 2023-01-09 DIAGNOSIS — E119 Type 2 diabetes mellitus without complications: Secondary | ICD-10-CM | POA: Diagnosis not present

## 2023-01-09 DIAGNOSIS — M4302 Spondylolysis, cervical region: Secondary | ICD-10-CM | POA: Diagnosis not present

## 2023-01-09 DIAGNOSIS — R630 Anorexia: Secondary | ICD-10-CM | POA: Diagnosis not present

## 2023-01-09 DIAGNOSIS — F02818 Dementia in other diseases classified elsewhere, unspecified severity, with other behavioral disturbance: Secondary | ICD-10-CM | POA: Diagnosis not present

## 2023-01-09 DIAGNOSIS — G311 Senile degeneration of brain, not elsewhere classified: Secondary | ICD-10-CM | POA: Diagnosis not present

## 2023-01-09 DIAGNOSIS — K529 Noninfective gastroenteritis and colitis, unspecified: Secondary | ICD-10-CM | POA: Diagnosis not present

## 2023-01-10 DIAGNOSIS — G311 Senile degeneration of brain, not elsewhere classified: Secondary | ICD-10-CM | POA: Diagnosis not present

## 2023-01-10 DIAGNOSIS — R630 Anorexia: Secondary | ICD-10-CM | POA: Diagnosis not present

## 2023-01-10 DIAGNOSIS — M4302 Spondylolysis, cervical region: Secondary | ICD-10-CM | POA: Diagnosis not present

## 2023-01-10 DIAGNOSIS — F02818 Dementia in other diseases classified elsewhere, unspecified severity, with other behavioral disturbance: Secondary | ICD-10-CM | POA: Diagnosis not present

## 2023-01-10 DIAGNOSIS — E119 Type 2 diabetes mellitus without complications: Secondary | ICD-10-CM | POA: Diagnosis not present

## 2023-01-10 DIAGNOSIS — K529 Noninfective gastroenteritis and colitis, unspecified: Secondary | ICD-10-CM | POA: Diagnosis not present

## 2023-01-11 DIAGNOSIS — E119 Type 2 diabetes mellitus without complications: Secondary | ICD-10-CM | POA: Diagnosis not present

## 2023-01-11 DIAGNOSIS — K529 Noninfective gastroenteritis and colitis, unspecified: Secondary | ICD-10-CM | POA: Diagnosis not present

## 2023-01-11 DIAGNOSIS — F02818 Dementia in other diseases classified elsewhere, unspecified severity, with other behavioral disturbance: Secondary | ICD-10-CM | POA: Diagnosis not present

## 2023-01-11 DIAGNOSIS — M4302 Spondylolysis, cervical region: Secondary | ICD-10-CM | POA: Diagnosis not present

## 2023-01-11 DIAGNOSIS — G311 Senile degeneration of brain, not elsewhere classified: Secondary | ICD-10-CM | POA: Diagnosis not present

## 2023-01-11 DIAGNOSIS — R630 Anorexia: Secondary | ICD-10-CM | POA: Diagnosis not present

## 2023-01-12 DIAGNOSIS — E119 Type 2 diabetes mellitus without complications: Secondary | ICD-10-CM | POA: Diagnosis not present

## 2023-01-12 DIAGNOSIS — F02818 Dementia in other diseases classified elsewhere, unspecified severity, with other behavioral disturbance: Secondary | ICD-10-CM | POA: Diagnosis not present

## 2023-01-12 DIAGNOSIS — K529 Noninfective gastroenteritis and colitis, unspecified: Secondary | ICD-10-CM | POA: Diagnosis not present

## 2023-01-12 DIAGNOSIS — G311 Senile degeneration of brain, not elsewhere classified: Secondary | ICD-10-CM | POA: Diagnosis not present

## 2023-01-12 DIAGNOSIS — M4302 Spondylolysis, cervical region: Secondary | ICD-10-CM | POA: Diagnosis not present

## 2023-01-12 DIAGNOSIS — R630 Anorexia: Secondary | ICD-10-CM | POA: Diagnosis not present

## 2023-01-13 DIAGNOSIS — K529 Noninfective gastroenteritis and colitis, unspecified: Secondary | ICD-10-CM | POA: Diagnosis not present

## 2023-01-13 DIAGNOSIS — G311 Senile degeneration of brain, not elsewhere classified: Secondary | ICD-10-CM | POA: Diagnosis not present

## 2023-01-13 DIAGNOSIS — M4302 Spondylolysis, cervical region: Secondary | ICD-10-CM | POA: Diagnosis not present

## 2023-01-13 DIAGNOSIS — F02818 Dementia in other diseases classified elsewhere, unspecified severity, with other behavioral disturbance: Secondary | ICD-10-CM | POA: Diagnosis not present

## 2023-01-13 DIAGNOSIS — E119 Type 2 diabetes mellitus without complications: Secondary | ICD-10-CM | POA: Diagnosis not present

## 2023-01-13 DIAGNOSIS — R630 Anorexia: Secondary | ICD-10-CM | POA: Diagnosis not present

## 2023-01-14 DIAGNOSIS — K529 Noninfective gastroenteritis and colitis, unspecified: Secondary | ICD-10-CM | POA: Diagnosis not present

## 2023-01-14 DIAGNOSIS — F02818 Dementia in other diseases classified elsewhere, unspecified severity, with other behavioral disturbance: Secondary | ICD-10-CM | POA: Diagnosis not present

## 2023-01-14 DIAGNOSIS — G311 Senile degeneration of brain, not elsewhere classified: Secondary | ICD-10-CM | POA: Diagnosis not present

## 2023-01-14 DIAGNOSIS — E119 Type 2 diabetes mellitus without complications: Secondary | ICD-10-CM | POA: Diagnosis not present

## 2023-01-14 DIAGNOSIS — R630 Anorexia: Secondary | ICD-10-CM | POA: Diagnosis not present

## 2023-01-14 DIAGNOSIS — M4302 Spondylolysis, cervical region: Secondary | ICD-10-CM | POA: Diagnosis not present

## 2023-01-15 DIAGNOSIS — G311 Senile degeneration of brain, not elsewhere classified: Secondary | ICD-10-CM | POA: Diagnosis not present

## 2023-01-15 DIAGNOSIS — E119 Type 2 diabetes mellitus without complications: Secondary | ICD-10-CM | POA: Diagnosis not present

## 2023-01-15 DIAGNOSIS — M4302 Spondylolysis, cervical region: Secondary | ICD-10-CM | POA: Diagnosis not present

## 2023-01-15 DIAGNOSIS — F02818 Dementia in other diseases classified elsewhere, unspecified severity, with other behavioral disturbance: Secondary | ICD-10-CM | POA: Diagnosis not present

## 2023-01-15 DIAGNOSIS — R630 Anorexia: Secondary | ICD-10-CM | POA: Diagnosis not present

## 2023-01-15 DIAGNOSIS — K529 Noninfective gastroenteritis and colitis, unspecified: Secondary | ICD-10-CM | POA: Diagnosis not present

## 2023-01-16 DIAGNOSIS — R630 Anorexia: Secondary | ICD-10-CM | POA: Diagnosis not present

## 2023-01-16 DIAGNOSIS — K529 Noninfective gastroenteritis and colitis, unspecified: Secondary | ICD-10-CM | POA: Diagnosis not present

## 2023-01-16 DIAGNOSIS — E119 Type 2 diabetes mellitus without complications: Secondary | ICD-10-CM | POA: Diagnosis not present

## 2023-01-16 DIAGNOSIS — M4302 Spondylolysis, cervical region: Secondary | ICD-10-CM | POA: Diagnosis not present

## 2023-01-16 DIAGNOSIS — F02818 Dementia in other diseases classified elsewhere, unspecified severity, with other behavioral disturbance: Secondary | ICD-10-CM | POA: Diagnosis not present

## 2023-01-16 DIAGNOSIS — G311 Senile degeneration of brain, not elsewhere classified: Secondary | ICD-10-CM | POA: Diagnosis not present

## 2023-01-17 DIAGNOSIS — F02818 Dementia in other diseases classified elsewhere, unspecified severity, with other behavioral disturbance: Secondary | ICD-10-CM | POA: Diagnosis not present

## 2023-01-17 DIAGNOSIS — R630 Anorexia: Secondary | ICD-10-CM | POA: Diagnosis not present

## 2023-01-17 DIAGNOSIS — G311 Senile degeneration of brain, not elsewhere classified: Secondary | ICD-10-CM | POA: Diagnosis not present

## 2023-01-17 DIAGNOSIS — E119 Type 2 diabetes mellitus without complications: Secondary | ICD-10-CM | POA: Diagnosis not present

## 2023-01-17 DIAGNOSIS — K529 Noninfective gastroenteritis and colitis, unspecified: Secondary | ICD-10-CM | POA: Diagnosis not present

## 2023-01-17 DIAGNOSIS — M4302 Spondylolysis, cervical region: Secondary | ICD-10-CM | POA: Diagnosis not present

## 2023-01-18 DIAGNOSIS — E039 Hypothyroidism, unspecified: Secondary | ICD-10-CM | POA: Diagnosis not present

## 2023-01-18 DIAGNOSIS — I471 Supraventricular tachycardia, unspecified: Secondary | ICD-10-CM | POA: Diagnosis not present

## 2023-01-18 DIAGNOSIS — R630 Anorexia: Secondary | ICD-10-CM | POA: Diagnosis not present

## 2023-01-18 DIAGNOSIS — K529 Noninfective gastroenteritis and colitis, unspecified: Secondary | ICD-10-CM | POA: Diagnosis not present

## 2023-01-18 DIAGNOSIS — G311 Senile degeneration of brain, not elsewhere classified: Secondary | ICD-10-CM | POA: Diagnosis not present

## 2023-01-18 DIAGNOSIS — I10 Essential (primary) hypertension: Secondary | ICD-10-CM | POA: Diagnosis not present

## 2023-01-18 DIAGNOSIS — M4302 Spondylolysis, cervical region: Secondary | ICD-10-CM | POA: Diagnosis not present

## 2023-01-18 DIAGNOSIS — F02818 Dementia in other diseases classified elsewhere, unspecified severity, with other behavioral disturbance: Secondary | ICD-10-CM | POA: Diagnosis not present

## 2023-01-18 DIAGNOSIS — M1990 Unspecified osteoarthritis, unspecified site: Secondary | ICD-10-CM | POA: Diagnosis not present

## 2023-01-18 DIAGNOSIS — E119 Type 2 diabetes mellitus without complications: Secondary | ICD-10-CM | POA: Diagnosis not present

## 2023-01-18 DIAGNOSIS — E785 Hyperlipidemia, unspecified: Secondary | ICD-10-CM | POA: Diagnosis not present

## 2023-01-19 DIAGNOSIS — R630 Anorexia: Secondary | ICD-10-CM | POA: Diagnosis not present

## 2023-01-19 DIAGNOSIS — F02818 Dementia in other diseases classified elsewhere, unspecified severity, with other behavioral disturbance: Secondary | ICD-10-CM | POA: Diagnosis not present

## 2023-01-19 DIAGNOSIS — G311 Senile degeneration of brain, not elsewhere classified: Secondary | ICD-10-CM | POA: Diagnosis not present

## 2023-01-19 DIAGNOSIS — M4302 Spondylolysis, cervical region: Secondary | ICD-10-CM | POA: Diagnosis not present

## 2023-01-19 DIAGNOSIS — K529 Noninfective gastroenteritis and colitis, unspecified: Secondary | ICD-10-CM | POA: Diagnosis not present

## 2023-01-19 DIAGNOSIS — E119 Type 2 diabetes mellitus without complications: Secondary | ICD-10-CM | POA: Diagnosis not present

## 2023-01-20 DIAGNOSIS — E119 Type 2 diabetes mellitus without complications: Secondary | ICD-10-CM | POA: Diagnosis not present

## 2023-01-20 DIAGNOSIS — K529 Noninfective gastroenteritis and colitis, unspecified: Secondary | ICD-10-CM | POA: Diagnosis not present

## 2023-01-20 DIAGNOSIS — M4302 Spondylolysis, cervical region: Secondary | ICD-10-CM | POA: Diagnosis not present

## 2023-01-20 DIAGNOSIS — R630 Anorexia: Secondary | ICD-10-CM | POA: Diagnosis not present

## 2023-01-20 DIAGNOSIS — G311 Senile degeneration of brain, not elsewhere classified: Secondary | ICD-10-CM | POA: Diagnosis not present

## 2023-01-20 DIAGNOSIS — F02818 Dementia in other diseases classified elsewhere, unspecified severity, with other behavioral disturbance: Secondary | ICD-10-CM | POA: Diagnosis not present

## 2023-01-21 DIAGNOSIS — R630 Anorexia: Secondary | ICD-10-CM | POA: Diagnosis not present

## 2023-01-21 DIAGNOSIS — K529 Noninfective gastroenteritis and colitis, unspecified: Secondary | ICD-10-CM | POA: Diagnosis not present

## 2023-01-21 DIAGNOSIS — G311 Senile degeneration of brain, not elsewhere classified: Secondary | ICD-10-CM | POA: Diagnosis not present

## 2023-01-21 DIAGNOSIS — M4302 Spondylolysis, cervical region: Secondary | ICD-10-CM | POA: Diagnosis not present

## 2023-01-21 DIAGNOSIS — F02818 Dementia in other diseases classified elsewhere, unspecified severity, with other behavioral disturbance: Secondary | ICD-10-CM | POA: Diagnosis not present

## 2023-01-21 DIAGNOSIS — E119 Type 2 diabetes mellitus without complications: Secondary | ICD-10-CM | POA: Diagnosis not present

## 2023-01-22 DIAGNOSIS — E119 Type 2 diabetes mellitus without complications: Secondary | ICD-10-CM | POA: Diagnosis not present

## 2023-01-22 DIAGNOSIS — K529 Noninfective gastroenteritis and colitis, unspecified: Secondary | ICD-10-CM | POA: Diagnosis not present

## 2023-01-22 DIAGNOSIS — G311 Senile degeneration of brain, not elsewhere classified: Secondary | ICD-10-CM | POA: Diagnosis not present

## 2023-01-22 DIAGNOSIS — R630 Anorexia: Secondary | ICD-10-CM | POA: Diagnosis not present

## 2023-01-22 DIAGNOSIS — M4302 Spondylolysis, cervical region: Secondary | ICD-10-CM | POA: Diagnosis not present

## 2023-01-22 DIAGNOSIS — F02818 Dementia in other diseases classified elsewhere, unspecified severity, with other behavioral disturbance: Secondary | ICD-10-CM | POA: Diagnosis not present

## 2023-01-23 DIAGNOSIS — G311 Senile degeneration of brain, not elsewhere classified: Secondary | ICD-10-CM | POA: Diagnosis not present

## 2023-01-23 DIAGNOSIS — M4302 Spondylolysis, cervical region: Secondary | ICD-10-CM | POA: Diagnosis not present

## 2023-01-23 DIAGNOSIS — R630 Anorexia: Secondary | ICD-10-CM | POA: Diagnosis not present

## 2023-01-23 DIAGNOSIS — F02818 Dementia in other diseases classified elsewhere, unspecified severity, with other behavioral disturbance: Secondary | ICD-10-CM | POA: Diagnosis not present

## 2023-01-23 DIAGNOSIS — K529 Noninfective gastroenteritis and colitis, unspecified: Secondary | ICD-10-CM | POA: Diagnosis not present

## 2023-01-23 DIAGNOSIS — E119 Type 2 diabetes mellitus without complications: Secondary | ICD-10-CM | POA: Diagnosis not present

## 2023-01-24 DIAGNOSIS — G311 Senile degeneration of brain, not elsewhere classified: Secondary | ICD-10-CM | POA: Diagnosis not present

## 2023-01-24 DIAGNOSIS — K529 Noninfective gastroenteritis and colitis, unspecified: Secondary | ICD-10-CM | POA: Diagnosis not present

## 2023-01-24 DIAGNOSIS — E119 Type 2 diabetes mellitus without complications: Secondary | ICD-10-CM | POA: Diagnosis not present

## 2023-01-24 DIAGNOSIS — M4302 Spondylolysis, cervical region: Secondary | ICD-10-CM | POA: Diagnosis not present

## 2023-01-24 DIAGNOSIS — F02818 Dementia in other diseases classified elsewhere, unspecified severity, with other behavioral disturbance: Secondary | ICD-10-CM | POA: Diagnosis not present

## 2023-01-24 DIAGNOSIS — R630 Anorexia: Secondary | ICD-10-CM | POA: Diagnosis not present

## 2023-01-25 DIAGNOSIS — F02818 Dementia in other diseases classified elsewhere, unspecified severity, with other behavioral disturbance: Secondary | ICD-10-CM | POA: Diagnosis not present

## 2023-01-25 DIAGNOSIS — M4302 Spondylolysis, cervical region: Secondary | ICD-10-CM | POA: Diagnosis not present

## 2023-01-25 DIAGNOSIS — K529 Noninfective gastroenteritis and colitis, unspecified: Secondary | ICD-10-CM | POA: Diagnosis not present

## 2023-01-25 DIAGNOSIS — G311 Senile degeneration of brain, not elsewhere classified: Secondary | ICD-10-CM | POA: Diagnosis not present

## 2023-01-25 DIAGNOSIS — E119 Type 2 diabetes mellitus without complications: Secondary | ICD-10-CM | POA: Diagnosis not present

## 2023-01-25 DIAGNOSIS — R630 Anorexia: Secondary | ICD-10-CM | POA: Diagnosis not present

## 2023-01-26 DIAGNOSIS — R630 Anorexia: Secondary | ICD-10-CM | POA: Diagnosis not present

## 2023-01-26 DIAGNOSIS — E119 Type 2 diabetes mellitus without complications: Secondary | ICD-10-CM | POA: Diagnosis not present

## 2023-01-26 DIAGNOSIS — G311 Senile degeneration of brain, not elsewhere classified: Secondary | ICD-10-CM | POA: Diagnosis not present

## 2023-01-26 DIAGNOSIS — K529 Noninfective gastroenteritis and colitis, unspecified: Secondary | ICD-10-CM | POA: Diagnosis not present

## 2023-01-26 DIAGNOSIS — M4302 Spondylolysis, cervical region: Secondary | ICD-10-CM | POA: Diagnosis not present

## 2023-01-26 DIAGNOSIS — F02818 Dementia in other diseases classified elsewhere, unspecified severity, with other behavioral disturbance: Secondary | ICD-10-CM | POA: Diagnosis not present

## 2023-01-27 DIAGNOSIS — K529 Noninfective gastroenteritis and colitis, unspecified: Secondary | ICD-10-CM | POA: Diagnosis not present

## 2023-01-27 DIAGNOSIS — E119 Type 2 diabetes mellitus without complications: Secondary | ICD-10-CM | POA: Diagnosis not present

## 2023-01-27 DIAGNOSIS — R630 Anorexia: Secondary | ICD-10-CM | POA: Diagnosis not present

## 2023-01-27 DIAGNOSIS — M4302 Spondylolysis, cervical region: Secondary | ICD-10-CM | POA: Diagnosis not present

## 2023-01-27 DIAGNOSIS — F02818 Dementia in other diseases classified elsewhere, unspecified severity, with other behavioral disturbance: Secondary | ICD-10-CM | POA: Diagnosis not present

## 2023-01-27 DIAGNOSIS — G311 Senile degeneration of brain, not elsewhere classified: Secondary | ICD-10-CM | POA: Diagnosis not present

## 2023-01-28 DIAGNOSIS — M4302 Spondylolysis, cervical region: Secondary | ICD-10-CM | POA: Diagnosis not present

## 2023-01-28 DIAGNOSIS — E119 Type 2 diabetes mellitus without complications: Secondary | ICD-10-CM | POA: Diagnosis not present

## 2023-01-28 DIAGNOSIS — R630 Anorexia: Secondary | ICD-10-CM | POA: Diagnosis not present

## 2023-01-28 DIAGNOSIS — G311 Senile degeneration of brain, not elsewhere classified: Secondary | ICD-10-CM | POA: Diagnosis not present

## 2023-01-28 DIAGNOSIS — F02818 Dementia in other diseases classified elsewhere, unspecified severity, with other behavioral disturbance: Secondary | ICD-10-CM | POA: Diagnosis not present

## 2023-01-28 DIAGNOSIS — K529 Noninfective gastroenteritis and colitis, unspecified: Secondary | ICD-10-CM | POA: Diagnosis not present

## 2023-01-29 DIAGNOSIS — G311 Senile degeneration of brain, not elsewhere classified: Secondary | ICD-10-CM | POA: Diagnosis not present

## 2023-01-29 DIAGNOSIS — F02818 Dementia in other diseases classified elsewhere, unspecified severity, with other behavioral disturbance: Secondary | ICD-10-CM | POA: Diagnosis not present

## 2023-01-29 DIAGNOSIS — E119 Type 2 diabetes mellitus without complications: Secondary | ICD-10-CM | POA: Diagnosis not present

## 2023-01-29 DIAGNOSIS — M4302 Spondylolysis, cervical region: Secondary | ICD-10-CM | POA: Diagnosis not present

## 2023-01-29 DIAGNOSIS — R630 Anorexia: Secondary | ICD-10-CM | POA: Diagnosis not present

## 2023-01-29 DIAGNOSIS — K529 Noninfective gastroenteritis and colitis, unspecified: Secondary | ICD-10-CM | POA: Diagnosis not present

## 2023-01-30 DIAGNOSIS — G311 Senile degeneration of brain, not elsewhere classified: Secondary | ICD-10-CM | POA: Diagnosis not present

## 2023-01-30 DIAGNOSIS — R630 Anorexia: Secondary | ICD-10-CM | POA: Diagnosis not present

## 2023-01-30 DIAGNOSIS — E119 Type 2 diabetes mellitus without complications: Secondary | ICD-10-CM | POA: Diagnosis not present

## 2023-01-30 DIAGNOSIS — F02818 Dementia in other diseases classified elsewhere, unspecified severity, with other behavioral disturbance: Secondary | ICD-10-CM | POA: Diagnosis not present

## 2023-01-30 DIAGNOSIS — M4302 Spondylolysis, cervical region: Secondary | ICD-10-CM | POA: Diagnosis not present

## 2023-01-30 DIAGNOSIS — K529 Noninfective gastroenteritis and colitis, unspecified: Secondary | ICD-10-CM | POA: Diagnosis not present

## 2023-01-31 DIAGNOSIS — E119 Type 2 diabetes mellitus without complications: Secondary | ICD-10-CM | POA: Diagnosis not present

## 2023-01-31 DIAGNOSIS — R630 Anorexia: Secondary | ICD-10-CM | POA: Diagnosis not present

## 2023-01-31 DIAGNOSIS — K529 Noninfective gastroenteritis and colitis, unspecified: Secondary | ICD-10-CM | POA: Diagnosis not present

## 2023-01-31 DIAGNOSIS — M4302 Spondylolysis, cervical region: Secondary | ICD-10-CM | POA: Diagnosis not present

## 2023-01-31 DIAGNOSIS — F02818 Dementia in other diseases classified elsewhere, unspecified severity, with other behavioral disturbance: Secondary | ICD-10-CM | POA: Diagnosis not present

## 2023-01-31 DIAGNOSIS — G311 Senile degeneration of brain, not elsewhere classified: Secondary | ICD-10-CM | POA: Diagnosis not present

## 2023-02-01 DIAGNOSIS — F02818 Dementia in other diseases classified elsewhere, unspecified severity, with other behavioral disturbance: Secondary | ICD-10-CM | POA: Diagnosis not present

## 2023-02-01 DIAGNOSIS — E119 Type 2 diabetes mellitus without complications: Secondary | ICD-10-CM | POA: Diagnosis not present

## 2023-02-01 DIAGNOSIS — K529 Noninfective gastroenteritis and colitis, unspecified: Secondary | ICD-10-CM | POA: Diagnosis not present

## 2023-02-01 DIAGNOSIS — M4302 Spondylolysis, cervical region: Secondary | ICD-10-CM | POA: Diagnosis not present

## 2023-02-01 DIAGNOSIS — G311 Senile degeneration of brain, not elsewhere classified: Secondary | ICD-10-CM | POA: Diagnosis not present

## 2023-02-01 DIAGNOSIS — R630 Anorexia: Secondary | ICD-10-CM | POA: Diagnosis not present

## 2023-02-02 DIAGNOSIS — F02818 Dementia in other diseases classified elsewhere, unspecified severity, with other behavioral disturbance: Secondary | ICD-10-CM | POA: Diagnosis not present

## 2023-02-02 DIAGNOSIS — M4302 Spondylolysis, cervical region: Secondary | ICD-10-CM | POA: Diagnosis not present

## 2023-02-02 DIAGNOSIS — K529 Noninfective gastroenteritis and colitis, unspecified: Secondary | ICD-10-CM | POA: Diagnosis not present

## 2023-02-02 DIAGNOSIS — E119 Type 2 diabetes mellitus without complications: Secondary | ICD-10-CM | POA: Diagnosis not present

## 2023-02-02 DIAGNOSIS — G311 Senile degeneration of brain, not elsewhere classified: Secondary | ICD-10-CM | POA: Diagnosis not present

## 2023-02-02 DIAGNOSIS — R630 Anorexia: Secondary | ICD-10-CM | POA: Diagnosis not present

## 2023-02-03 DIAGNOSIS — E119 Type 2 diabetes mellitus without complications: Secondary | ICD-10-CM | POA: Diagnosis not present

## 2023-02-03 DIAGNOSIS — R630 Anorexia: Secondary | ICD-10-CM | POA: Diagnosis not present

## 2023-02-03 DIAGNOSIS — F02818 Dementia in other diseases classified elsewhere, unspecified severity, with other behavioral disturbance: Secondary | ICD-10-CM | POA: Diagnosis not present

## 2023-02-03 DIAGNOSIS — G311 Senile degeneration of brain, not elsewhere classified: Secondary | ICD-10-CM | POA: Diagnosis not present

## 2023-02-03 DIAGNOSIS — K529 Noninfective gastroenteritis and colitis, unspecified: Secondary | ICD-10-CM | POA: Diagnosis not present

## 2023-02-03 DIAGNOSIS — M4302 Spondylolysis, cervical region: Secondary | ICD-10-CM | POA: Diagnosis not present

## 2023-02-04 DIAGNOSIS — R630 Anorexia: Secondary | ICD-10-CM | POA: Diagnosis not present

## 2023-02-04 DIAGNOSIS — G311 Senile degeneration of brain, not elsewhere classified: Secondary | ICD-10-CM | POA: Diagnosis not present

## 2023-02-04 DIAGNOSIS — E119 Type 2 diabetes mellitus without complications: Secondary | ICD-10-CM | POA: Diagnosis not present

## 2023-02-04 DIAGNOSIS — F02818 Dementia in other diseases classified elsewhere, unspecified severity, with other behavioral disturbance: Secondary | ICD-10-CM | POA: Diagnosis not present

## 2023-02-04 DIAGNOSIS — M4302 Spondylolysis, cervical region: Secondary | ICD-10-CM | POA: Diagnosis not present

## 2023-02-04 DIAGNOSIS — K529 Noninfective gastroenteritis and colitis, unspecified: Secondary | ICD-10-CM | POA: Diagnosis not present

## 2023-02-05 DIAGNOSIS — R630 Anorexia: Secondary | ICD-10-CM | POA: Diagnosis not present

## 2023-02-05 DIAGNOSIS — K529 Noninfective gastroenteritis and colitis, unspecified: Secondary | ICD-10-CM | POA: Diagnosis not present

## 2023-02-05 DIAGNOSIS — F02818 Dementia in other diseases classified elsewhere, unspecified severity, with other behavioral disturbance: Secondary | ICD-10-CM | POA: Diagnosis not present

## 2023-02-05 DIAGNOSIS — G311 Senile degeneration of brain, not elsewhere classified: Secondary | ICD-10-CM | POA: Diagnosis not present

## 2023-02-05 DIAGNOSIS — E119 Type 2 diabetes mellitus without complications: Secondary | ICD-10-CM | POA: Diagnosis not present

## 2023-02-05 DIAGNOSIS — M4302 Spondylolysis, cervical region: Secondary | ICD-10-CM | POA: Diagnosis not present

## 2023-02-06 DIAGNOSIS — F02818 Dementia in other diseases classified elsewhere, unspecified severity, with other behavioral disturbance: Secondary | ICD-10-CM | POA: Diagnosis not present

## 2023-02-06 DIAGNOSIS — M4302 Spondylolysis, cervical region: Secondary | ICD-10-CM | POA: Diagnosis not present

## 2023-02-06 DIAGNOSIS — R630 Anorexia: Secondary | ICD-10-CM | POA: Diagnosis not present

## 2023-02-06 DIAGNOSIS — K529 Noninfective gastroenteritis and colitis, unspecified: Secondary | ICD-10-CM | POA: Diagnosis not present

## 2023-02-06 DIAGNOSIS — E119 Type 2 diabetes mellitus without complications: Secondary | ICD-10-CM | POA: Diagnosis not present

## 2023-02-06 DIAGNOSIS — G311 Senile degeneration of brain, not elsewhere classified: Secondary | ICD-10-CM | POA: Diagnosis not present

## 2023-02-07 DIAGNOSIS — K529 Noninfective gastroenteritis and colitis, unspecified: Secondary | ICD-10-CM | POA: Diagnosis not present

## 2023-02-07 DIAGNOSIS — E119 Type 2 diabetes mellitus without complications: Secondary | ICD-10-CM | POA: Diagnosis not present

## 2023-02-07 DIAGNOSIS — G311 Senile degeneration of brain, not elsewhere classified: Secondary | ICD-10-CM | POA: Diagnosis not present

## 2023-02-07 DIAGNOSIS — M4302 Spondylolysis, cervical region: Secondary | ICD-10-CM | POA: Diagnosis not present

## 2023-02-07 DIAGNOSIS — F02818 Dementia in other diseases classified elsewhere, unspecified severity, with other behavioral disturbance: Secondary | ICD-10-CM | POA: Diagnosis not present

## 2023-02-07 DIAGNOSIS — R630 Anorexia: Secondary | ICD-10-CM | POA: Diagnosis not present

## 2023-02-08 DIAGNOSIS — F02818 Dementia in other diseases classified elsewhere, unspecified severity, with other behavioral disturbance: Secondary | ICD-10-CM | POA: Diagnosis not present

## 2023-02-08 DIAGNOSIS — G311 Senile degeneration of brain, not elsewhere classified: Secondary | ICD-10-CM | POA: Diagnosis not present

## 2023-02-08 DIAGNOSIS — K529 Noninfective gastroenteritis and colitis, unspecified: Secondary | ICD-10-CM | POA: Diagnosis not present

## 2023-02-08 DIAGNOSIS — R630 Anorexia: Secondary | ICD-10-CM | POA: Diagnosis not present

## 2023-02-08 DIAGNOSIS — E119 Type 2 diabetes mellitus without complications: Secondary | ICD-10-CM | POA: Diagnosis not present

## 2023-02-08 DIAGNOSIS — M4302 Spondylolysis, cervical region: Secondary | ICD-10-CM | POA: Diagnosis not present

## 2023-02-09 DIAGNOSIS — E119 Type 2 diabetes mellitus without complications: Secondary | ICD-10-CM | POA: Diagnosis not present

## 2023-02-09 DIAGNOSIS — R630 Anorexia: Secondary | ICD-10-CM | POA: Diagnosis not present

## 2023-02-09 DIAGNOSIS — G311 Senile degeneration of brain, not elsewhere classified: Secondary | ICD-10-CM | POA: Diagnosis not present

## 2023-02-09 DIAGNOSIS — F02818 Dementia in other diseases classified elsewhere, unspecified severity, with other behavioral disturbance: Secondary | ICD-10-CM | POA: Diagnosis not present

## 2023-02-09 DIAGNOSIS — M4302 Spondylolysis, cervical region: Secondary | ICD-10-CM | POA: Diagnosis not present

## 2023-02-09 DIAGNOSIS — K529 Noninfective gastroenteritis and colitis, unspecified: Secondary | ICD-10-CM | POA: Diagnosis not present

## 2023-02-10 DIAGNOSIS — M4302 Spondylolysis, cervical region: Secondary | ICD-10-CM | POA: Diagnosis not present

## 2023-02-10 DIAGNOSIS — E119 Type 2 diabetes mellitus without complications: Secondary | ICD-10-CM | POA: Diagnosis not present

## 2023-02-10 DIAGNOSIS — G311 Senile degeneration of brain, not elsewhere classified: Secondary | ICD-10-CM | POA: Diagnosis not present

## 2023-02-10 DIAGNOSIS — R630 Anorexia: Secondary | ICD-10-CM | POA: Diagnosis not present

## 2023-02-10 DIAGNOSIS — F02818 Dementia in other diseases classified elsewhere, unspecified severity, with other behavioral disturbance: Secondary | ICD-10-CM | POA: Diagnosis not present

## 2023-02-10 DIAGNOSIS — K529 Noninfective gastroenteritis and colitis, unspecified: Secondary | ICD-10-CM | POA: Diagnosis not present

## 2023-02-11 DIAGNOSIS — E119 Type 2 diabetes mellitus without complications: Secondary | ICD-10-CM | POA: Diagnosis not present

## 2023-02-11 DIAGNOSIS — G311 Senile degeneration of brain, not elsewhere classified: Secondary | ICD-10-CM | POA: Diagnosis not present

## 2023-02-11 DIAGNOSIS — M4302 Spondylolysis, cervical region: Secondary | ICD-10-CM | POA: Diagnosis not present

## 2023-02-11 DIAGNOSIS — K529 Noninfective gastroenteritis and colitis, unspecified: Secondary | ICD-10-CM | POA: Diagnosis not present

## 2023-02-11 DIAGNOSIS — F02818 Dementia in other diseases classified elsewhere, unspecified severity, with other behavioral disturbance: Secondary | ICD-10-CM | POA: Diagnosis not present

## 2023-02-11 DIAGNOSIS — R630 Anorexia: Secondary | ICD-10-CM | POA: Diagnosis not present

## 2023-02-12 DIAGNOSIS — F02818 Dementia in other diseases classified elsewhere, unspecified severity, with other behavioral disturbance: Secondary | ICD-10-CM | POA: Diagnosis not present

## 2023-02-12 DIAGNOSIS — R630 Anorexia: Secondary | ICD-10-CM | POA: Diagnosis not present

## 2023-02-12 DIAGNOSIS — G311 Senile degeneration of brain, not elsewhere classified: Secondary | ICD-10-CM | POA: Diagnosis not present

## 2023-02-12 DIAGNOSIS — K529 Noninfective gastroenteritis and colitis, unspecified: Secondary | ICD-10-CM | POA: Diagnosis not present

## 2023-02-12 DIAGNOSIS — M4302 Spondylolysis, cervical region: Secondary | ICD-10-CM | POA: Diagnosis not present

## 2023-02-12 DIAGNOSIS — E119 Type 2 diabetes mellitus without complications: Secondary | ICD-10-CM | POA: Diagnosis not present

## 2023-02-13 DIAGNOSIS — E119 Type 2 diabetes mellitus without complications: Secondary | ICD-10-CM | POA: Diagnosis not present

## 2023-02-13 DIAGNOSIS — G311 Senile degeneration of brain, not elsewhere classified: Secondary | ICD-10-CM | POA: Diagnosis not present

## 2023-02-13 DIAGNOSIS — F02818 Dementia in other diseases classified elsewhere, unspecified severity, with other behavioral disturbance: Secondary | ICD-10-CM | POA: Diagnosis not present

## 2023-02-13 DIAGNOSIS — R630 Anorexia: Secondary | ICD-10-CM | POA: Diagnosis not present

## 2023-02-13 DIAGNOSIS — M4302 Spondylolysis, cervical region: Secondary | ICD-10-CM | POA: Diagnosis not present

## 2023-02-13 DIAGNOSIS — K529 Noninfective gastroenteritis and colitis, unspecified: Secondary | ICD-10-CM | POA: Diagnosis not present

## 2023-02-14 DIAGNOSIS — M4302 Spondylolysis, cervical region: Secondary | ICD-10-CM | POA: Diagnosis not present

## 2023-02-14 DIAGNOSIS — F02818 Dementia in other diseases classified elsewhere, unspecified severity, with other behavioral disturbance: Secondary | ICD-10-CM | POA: Diagnosis not present

## 2023-02-14 DIAGNOSIS — K529 Noninfective gastroenteritis and colitis, unspecified: Secondary | ICD-10-CM | POA: Diagnosis not present

## 2023-02-14 DIAGNOSIS — G311 Senile degeneration of brain, not elsewhere classified: Secondary | ICD-10-CM | POA: Diagnosis not present

## 2023-02-14 DIAGNOSIS — E119 Type 2 diabetes mellitus without complications: Secondary | ICD-10-CM | POA: Diagnosis not present

## 2023-02-14 DIAGNOSIS — R630 Anorexia: Secondary | ICD-10-CM | POA: Diagnosis not present

## 2023-02-15 DIAGNOSIS — R630 Anorexia: Secondary | ICD-10-CM | POA: Diagnosis not present

## 2023-02-15 DIAGNOSIS — M4302 Spondylolysis, cervical region: Secondary | ICD-10-CM | POA: Diagnosis not present

## 2023-02-15 DIAGNOSIS — K529 Noninfective gastroenteritis and colitis, unspecified: Secondary | ICD-10-CM | POA: Diagnosis not present

## 2023-02-15 DIAGNOSIS — G311 Senile degeneration of brain, not elsewhere classified: Secondary | ICD-10-CM | POA: Diagnosis not present

## 2023-02-15 DIAGNOSIS — F02818 Dementia in other diseases classified elsewhere, unspecified severity, with other behavioral disturbance: Secondary | ICD-10-CM | POA: Diagnosis not present

## 2023-02-15 DIAGNOSIS — E119 Type 2 diabetes mellitus without complications: Secondary | ICD-10-CM | POA: Diagnosis not present

## 2023-02-16 DIAGNOSIS — K529 Noninfective gastroenteritis and colitis, unspecified: Secondary | ICD-10-CM | POA: Diagnosis not present

## 2023-02-16 DIAGNOSIS — E119 Type 2 diabetes mellitus without complications: Secondary | ICD-10-CM | POA: Diagnosis not present

## 2023-02-16 DIAGNOSIS — G311 Senile degeneration of brain, not elsewhere classified: Secondary | ICD-10-CM | POA: Diagnosis not present

## 2023-02-16 DIAGNOSIS — R630 Anorexia: Secondary | ICD-10-CM | POA: Diagnosis not present

## 2023-02-16 DIAGNOSIS — F02818 Dementia in other diseases classified elsewhere, unspecified severity, with other behavioral disturbance: Secondary | ICD-10-CM | POA: Diagnosis not present

## 2023-02-16 DIAGNOSIS — M4302 Spondylolysis, cervical region: Secondary | ICD-10-CM | POA: Diagnosis not present

## 2023-02-17 DIAGNOSIS — M4302 Spondylolysis, cervical region: Secondary | ICD-10-CM | POA: Diagnosis not present

## 2023-02-17 DIAGNOSIS — K529 Noninfective gastroenteritis and colitis, unspecified: Secondary | ICD-10-CM | POA: Diagnosis not present

## 2023-02-17 DIAGNOSIS — R630 Anorexia: Secondary | ICD-10-CM | POA: Diagnosis not present

## 2023-02-17 DIAGNOSIS — G311 Senile degeneration of brain, not elsewhere classified: Secondary | ICD-10-CM | POA: Diagnosis not present

## 2023-02-17 DIAGNOSIS — F02818 Dementia in other diseases classified elsewhere, unspecified severity, with other behavioral disturbance: Secondary | ICD-10-CM | POA: Diagnosis not present

## 2023-02-17 DIAGNOSIS — E119 Type 2 diabetes mellitus without complications: Secondary | ICD-10-CM | POA: Diagnosis not present

## 2023-02-18 DIAGNOSIS — M1990 Unspecified osteoarthritis, unspecified site: Secondary | ICD-10-CM | POA: Diagnosis not present

## 2023-02-18 DIAGNOSIS — E785 Hyperlipidemia, unspecified: Secondary | ICD-10-CM | POA: Diagnosis not present

## 2023-02-18 DIAGNOSIS — E119 Type 2 diabetes mellitus without complications: Secondary | ICD-10-CM | POA: Diagnosis not present

## 2023-02-18 DIAGNOSIS — G311 Senile degeneration of brain, not elsewhere classified: Secondary | ICD-10-CM | POA: Diagnosis not present

## 2023-02-18 DIAGNOSIS — E039 Hypothyroidism, unspecified: Secondary | ICD-10-CM | POA: Diagnosis not present

## 2023-02-18 DIAGNOSIS — I10 Essential (primary) hypertension: Secondary | ICD-10-CM | POA: Diagnosis not present

## 2023-02-18 DIAGNOSIS — K529 Noninfective gastroenteritis and colitis, unspecified: Secondary | ICD-10-CM | POA: Diagnosis not present

## 2023-02-18 DIAGNOSIS — R630 Anorexia: Secondary | ICD-10-CM | POA: Diagnosis not present

## 2023-02-18 DIAGNOSIS — M4302 Spondylolysis, cervical region: Secondary | ICD-10-CM | POA: Diagnosis not present

## 2023-02-18 DIAGNOSIS — I471 Supraventricular tachycardia, unspecified: Secondary | ICD-10-CM | POA: Diagnosis not present

## 2023-02-18 DIAGNOSIS — F02818 Dementia in other diseases classified elsewhere, unspecified severity, with other behavioral disturbance: Secondary | ICD-10-CM | POA: Diagnosis not present

## 2023-02-21 DIAGNOSIS — R630 Anorexia: Secondary | ICD-10-CM | POA: Diagnosis not present

## 2023-02-21 DIAGNOSIS — K529 Noninfective gastroenteritis and colitis, unspecified: Secondary | ICD-10-CM | POA: Diagnosis not present

## 2023-02-21 DIAGNOSIS — G311 Senile degeneration of brain, not elsewhere classified: Secondary | ICD-10-CM | POA: Diagnosis not present

## 2023-02-21 DIAGNOSIS — M4302 Spondylolysis, cervical region: Secondary | ICD-10-CM | POA: Diagnosis not present

## 2023-02-21 DIAGNOSIS — F02818 Dementia in other diseases classified elsewhere, unspecified severity, with other behavioral disturbance: Secondary | ICD-10-CM | POA: Diagnosis not present

## 2023-02-21 DIAGNOSIS — E119 Type 2 diabetes mellitus without complications: Secondary | ICD-10-CM | POA: Diagnosis not present

## 2023-02-28 DIAGNOSIS — M4302 Spondylolysis, cervical region: Secondary | ICD-10-CM | POA: Diagnosis not present

## 2023-02-28 DIAGNOSIS — R630 Anorexia: Secondary | ICD-10-CM | POA: Diagnosis not present

## 2023-02-28 DIAGNOSIS — E119 Type 2 diabetes mellitus without complications: Secondary | ICD-10-CM | POA: Diagnosis not present

## 2023-02-28 DIAGNOSIS — F02818 Dementia in other diseases classified elsewhere, unspecified severity, with other behavioral disturbance: Secondary | ICD-10-CM | POA: Diagnosis not present

## 2023-02-28 DIAGNOSIS — K529 Noninfective gastroenteritis and colitis, unspecified: Secondary | ICD-10-CM | POA: Diagnosis not present

## 2023-02-28 DIAGNOSIS — G311 Senile degeneration of brain, not elsewhere classified: Secondary | ICD-10-CM | POA: Diagnosis not present

## 2023-03-02 DIAGNOSIS — M4302 Spondylolysis, cervical region: Secondary | ICD-10-CM | POA: Diagnosis not present

## 2023-03-02 DIAGNOSIS — K529 Noninfective gastroenteritis and colitis, unspecified: Secondary | ICD-10-CM | POA: Diagnosis not present

## 2023-03-02 DIAGNOSIS — R630 Anorexia: Secondary | ICD-10-CM | POA: Diagnosis not present

## 2023-03-02 DIAGNOSIS — G311 Senile degeneration of brain, not elsewhere classified: Secondary | ICD-10-CM | POA: Diagnosis not present

## 2023-03-02 DIAGNOSIS — F02818 Dementia in other diseases classified elsewhere, unspecified severity, with other behavioral disturbance: Secondary | ICD-10-CM | POA: Diagnosis not present

## 2023-03-02 DIAGNOSIS — E119 Type 2 diabetes mellitus without complications: Secondary | ICD-10-CM | POA: Diagnosis not present

## 2023-03-03 DIAGNOSIS — R630 Anorexia: Secondary | ICD-10-CM | POA: Diagnosis not present

## 2023-03-03 DIAGNOSIS — F02818 Dementia in other diseases classified elsewhere, unspecified severity, with other behavioral disturbance: Secondary | ICD-10-CM | POA: Diagnosis not present

## 2023-03-03 DIAGNOSIS — M4302 Spondylolysis, cervical region: Secondary | ICD-10-CM | POA: Diagnosis not present

## 2023-03-03 DIAGNOSIS — E119 Type 2 diabetes mellitus without complications: Secondary | ICD-10-CM | POA: Diagnosis not present

## 2023-03-03 DIAGNOSIS — G311 Senile degeneration of brain, not elsewhere classified: Secondary | ICD-10-CM | POA: Diagnosis not present

## 2023-03-03 DIAGNOSIS — K529 Noninfective gastroenteritis and colitis, unspecified: Secondary | ICD-10-CM | POA: Diagnosis not present

## 2023-03-10 DIAGNOSIS — R630 Anorexia: Secondary | ICD-10-CM | POA: Diagnosis not present

## 2023-03-10 DIAGNOSIS — G311 Senile degeneration of brain, not elsewhere classified: Secondary | ICD-10-CM | POA: Diagnosis not present

## 2023-03-10 DIAGNOSIS — F02818 Dementia in other diseases classified elsewhere, unspecified severity, with other behavioral disturbance: Secondary | ICD-10-CM | POA: Diagnosis not present

## 2023-03-10 DIAGNOSIS — K529 Noninfective gastroenteritis and colitis, unspecified: Secondary | ICD-10-CM | POA: Diagnosis not present

## 2023-03-10 DIAGNOSIS — E119 Type 2 diabetes mellitus without complications: Secondary | ICD-10-CM | POA: Diagnosis not present

## 2023-03-10 DIAGNOSIS — M4302 Spondylolysis, cervical region: Secondary | ICD-10-CM | POA: Diagnosis not present

## 2023-03-15 DIAGNOSIS — G311 Senile degeneration of brain, not elsewhere classified: Secondary | ICD-10-CM | POA: Diagnosis not present

## 2023-03-15 DIAGNOSIS — F02818 Dementia in other diseases classified elsewhere, unspecified severity, with other behavioral disturbance: Secondary | ICD-10-CM | POA: Diagnosis not present

## 2023-03-15 DIAGNOSIS — K529 Noninfective gastroenteritis and colitis, unspecified: Secondary | ICD-10-CM | POA: Diagnosis not present

## 2023-03-15 DIAGNOSIS — R630 Anorexia: Secondary | ICD-10-CM | POA: Diagnosis not present

## 2023-03-15 DIAGNOSIS — E119 Type 2 diabetes mellitus without complications: Secondary | ICD-10-CM | POA: Diagnosis not present

## 2023-03-15 DIAGNOSIS — M4302 Spondylolysis, cervical region: Secondary | ICD-10-CM | POA: Diagnosis not present

## 2023-03-20 DIAGNOSIS — I10 Essential (primary) hypertension: Secondary | ICD-10-CM | POA: Diagnosis not present

## 2023-03-20 DIAGNOSIS — E785 Hyperlipidemia, unspecified: Secondary | ICD-10-CM | POA: Diagnosis not present

## 2023-03-20 DIAGNOSIS — E119 Type 2 diabetes mellitus without complications: Secondary | ICD-10-CM | POA: Diagnosis not present

## 2023-03-20 DIAGNOSIS — R630 Anorexia: Secondary | ICD-10-CM | POA: Diagnosis not present

## 2023-03-20 DIAGNOSIS — K529 Noninfective gastroenteritis and colitis, unspecified: Secondary | ICD-10-CM | POA: Diagnosis not present

## 2023-03-20 DIAGNOSIS — I471 Supraventricular tachycardia, unspecified: Secondary | ICD-10-CM | POA: Diagnosis not present

## 2023-03-20 DIAGNOSIS — M1990 Unspecified osteoarthritis, unspecified site: Secondary | ICD-10-CM | POA: Diagnosis not present

## 2023-03-20 DIAGNOSIS — E039 Hypothyroidism, unspecified: Secondary | ICD-10-CM | POA: Diagnosis not present

## 2023-03-20 DIAGNOSIS — M4302 Spondylolysis, cervical region: Secondary | ICD-10-CM | POA: Diagnosis not present

## 2023-03-20 DIAGNOSIS — F02818 Dementia in other diseases classified elsewhere, unspecified severity, with other behavioral disturbance: Secondary | ICD-10-CM | POA: Diagnosis not present

## 2023-03-20 DIAGNOSIS — G311 Senile degeneration of brain, not elsewhere classified: Secondary | ICD-10-CM | POA: Diagnosis not present

## 2023-03-22 DIAGNOSIS — M4302 Spondylolysis, cervical region: Secondary | ICD-10-CM | POA: Diagnosis not present

## 2023-03-22 DIAGNOSIS — E119 Type 2 diabetes mellitus without complications: Secondary | ICD-10-CM | POA: Diagnosis not present

## 2023-03-22 DIAGNOSIS — R630 Anorexia: Secondary | ICD-10-CM | POA: Diagnosis not present

## 2023-03-22 DIAGNOSIS — F02818 Dementia in other diseases classified elsewhere, unspecified severity, with other behavioral disturbance: Secondary | ICD-10-CM | POA: Diagnosis not present

## 2023-03-22 DIAGNOSIS — G311 Senile degeneration of brain, not elsewhere classified: Secondary | ICD-10-CM | POA: Diagnosis not present

## 2023-03-22 DIAGNOSIS — K529 Noninfective gastroenteritis and colitis, unspecified: Secondary | ICD-10-CM | POA: Diagnosis not present

## 2023-03-31 DIAGNOSIS — M4302 Spondylolysis, cervical region: Secondary | ICD-10-CM | POA: Diagnosis not present

## 2023-03-31 DIAGNOSIS — K529 Noninfective gastroenteritis and colitis, unspecified: Secondary | ICD-10-CM | POA: Diagnosis not present

## 2023-03-31 DIAGNOSIS — G311 Senile degeneration of brain, not elsewhere classified: Secondary | ICD-10-CM | POA: Diagnosis not present

## 2023-03-31 DIAGNOSIS — E119 Type 2 diabetes mellitus without complications: Secondary | ICD-10-CM | POA: Diagnosis not present

## 2023-03-31 DIAGNOSIS — R630 Anorexia: Secondary | ICD-10-CM | POA: Diagnosis not present

## 2023-03-31 DIAGNOSIS — F02818 Dementia in other diseases classified elsewhere, unspecified severity, with other behavioral disturbance: Secondary | ICD-10-CM | POA: Diagnosis not present

## 2023-04-07 DIAGNOSIS — R630 Anorexia: Secondary | ICD-10-CM | POA: Diagnosis not present

## 2023-04-07 DIAGNOSIS — M4302 Spondylolysis, cervical region: Secondary | ICD-10-CM | POA: Diagnosis not present

## 2023-04-07 DIAGNOSIS — K529 Noninfective gastroenteritis and colitis, unspecified: Secondary | ICD-10-CM | POA: Diagnosis not present

## 2023-04-07 DIAGNOSIS — G311 Senile degeneration of brain, not elsewhere classified: Secondary | ICD-10-CM | POA: Diagnosis not present

## 2023-04-07 DIAGNOSIS — F02818 Dementia in other diseases classified elsewhere, unspecified severity, with other behavioral disturbance: Secondary | ICD-10-CM | POA: Diagnosis not present

## 2023-04-07 DIAGNOSIS — E119 Type 2 diabetes mellitus without complications: Secondary | ICD-10-CM | POA: Diagnosis not present

## 2023-04-12 DIAGNOSIS — E119 Type 2 diabetes mellitus without complications: Secondary | ICD-10-CM | POA: Diagnosis not present

## 2023-04-12 DIAGNOSIS — F02818 Dementia in other diseases classified elsewhere, unspecified severity, with other behavioral disturbance: Secondary | ICD-10-CM | POA: Diagnosis not present

## 2023-04-12 DIAGNOSIS — M4302 Spondylolysis, cervical region: Secondary | ICD-10-CM | POA: Diagnosis not present

## 2023-04-12 DIAGNOSIS — R630 Anorexia: Secondary | ICD-10-CM | POA: Diagnosis not present

## 2023-04-12 DIAGNOSIS — G311 Senile degeneration of brain, not elsewhere classified: Secondary | ICD-10-CM | POA: Diagnosis not present

## 2023-04-12 DIAGNOSIS — K529 Noninfective gastroenteritis and colitis, unspecified: Secondary | ICD-10-CM | POA: Diagnosis not present

## 2023-04-18 DIAGNOSIS — G311 Senile degeneration of brain, not elsewhere classified: Secondary | ICD-10-CM | POA: Diagnosis not present

## 2023-04-18 DIAGNOSIS — F02818 Dementia in other diseases classified elsewhere, unspecified severity, with other behavioral disturbance: Secondary | ICD-10-CM | POA: Diagnosis not present

## 2023-04-18 DIAGNOSIS — E119 Type 2 diabetes mellitus without complications: Secondary | ICD-10-CM | POA: Diagnosis not present

## 2023-04-18 DIAGNOSIS — M4302 Spondylolysis, cervical region: Secondary | ICD-10-CM | POA: Diagnosis not present

## 2023-04-18 DIAGNOSIS — R630 Anorexia: Secondary | ICD-10-CM | POA: Diagnosis not present

## 2023-04-18 DIAGNOSIS — K529 Noninfective gastroenteritis and colitis, unspecified: Secondary | ICD-10-CM | POA: Diagnosis not present

## 2023-04-20 DIAGNOSIS — K529 Noninfective gastroenteritis and colitis, unspecified: Secondary | ICD-10-CM | POA: Diagnosis not present

## 2023-04-20 DIAGNOSIS — I10 Essential (primary) hypertension: Secondary | ICD-10-CM | POA: Diagnosis not present

## 2023-04-20 DIAGNOSIS — R630 Anorexia: Secondary | ICD-10-CM | POA: Diagnosis not present

## 2023-04-20 DIAGNOSIS — E039 Hypothyroidism, unspecified: Secondary | ICD-10-CM | POA: Diagnosis not present

## 2023-04-20 DIAGNOSIS — M4302 Spondylolysis, cervical region: Secondary | ICD-10-CM | POA: Diagnosis not present

## 2023-04-20 DIAGNOSIS — E119 Type 2 diabetes mellitus without complications: Secondary | ICD-10-CM | POA: Diagnosis not present

## 2023-04-20 DIAGNOSIS — I471 Supraventricular tachycardia, unspecified: Secondary | ICD-10-CM | POA: Diagnosis not present

## 2023-04-20 DIAGNOSIS — F02818 Dementia in other diseases classified elsewhere, unspecified severity, with other behavioral disturbance: Secondary | ICD-10-CM | POA: Diagnosis not present

## 2023-04-20 DIAGNOSIS — M1990 Unspecified osteoarthritis, unspecified site: Secondary | ICD-10-CM | POA: Diagnosis not present

## 2023-04-20 DIAGNOSIS — E785 Hyperlipidemia, unspecified: Secondary | ICD-10-CM | POA: Diagnosis not present

## 2023-04-20 DIAGNOSIS — G311 Senile degeneration of brain, not elsewhere classified: Secondary | ICD-10-CM | POA: Diagnosis not present

## 2023-04-27 DIAGNOSIS — K529 Noninfective gastroenteritis and colitis, unspecified: Secondary | ICD-10-CM | POA: Diagnosis not present

## 2023-04-27 DIAGNOSIS — M4302 Spondylolysis, cervical region: Secondary | ICD-10-CM | POA: Diagnosis not present

## 2023-04-27 DIAGNOSIS — F02818 Dementia in other diseases classified elsewhere, unspecified severity, with other behavioral disturbance: Secondary | ICD-10-CM | POA: Diagnosis not present

## 2023-04-27 DIAGNOSIS — R630 Anorexia: Secondary | ICD-10-CM | POA: Diagnosis not present

## 2023-04-27 DIAGNOSIS — E119 Type 2 diabetes mellitus without complications: Secondary | ICD-10-CM | POA: Diagnosis not present

## 2023-04-27 DIAGNOSIS — G311 Senile degeneration of brain, not elsewhere classified: Secondary | ICD-10-CM | POA: Diagnosis not present

## 2023-04-28 DIAGNOSIS — F02818 Dementia in other diseases classified elsewhere, unspecified severity, with other behavioral disturbance: Secondary | ICD-10-CM | POA: Diagnosis not present

## 2023-04-28 DIAGNOSIS — R630 Anorexia: Secondary | ICD-10-CM | POA: Diagnosis not present

## 2023-04-28 DIAGNOSIS — M4302 Spondylolysis, cervical region: Secondary | ICD-10-CM | POA: Diagnosis not present

## 2023-04-28 DIAGNOSIS — E119 Type 2 diabetes mellitus without complications: Secondary | ICD-10-CM | POA: Diagnosis not present

## 2023-04-28 DIAGNOSIS — G311 Senile degeneration of brain, not elsewhere classified: Secondary | ICD-10-CM | POA: Diagnosis not present

## 2023-04-28 DIAGNOSIS — K529 Noninfective gastroenteritis and colitis, unspecified: Secondary | ICD-10-CM | POA: Diagnosis not present

## 2023-05-03 DIAGNOSIS — M4302 Spondylolysis, cervical region: Secondary | ICD-10-CM | POA: Diagnosis not present

## 2023-05-03 DIAGNOSIS — E119 Type 2 diabetes mellitus without complications: Secondary | ICD-10-CM | POA: Diagnosis not present

## 2023-05-03 DIAGNOSIS — R630 Anorexia: Secondary | ICD-10-CM | POA: Diagnosis not present

## 2023-05-03 DIAGNOSIS — F02818 Dementia in other diseases classified elsewhere, unspecified severity, with other behavioral disturbance: Secondary | ICD-10-CM | POA: Diagnosis not present

## 2023-05-03 DIAGNOSIS — G311 Senile degeneration of brain, not elsewhere classified: Secondary | ICD-10-CM | POA: Diagnosis not present

## 2023-05-03 DIAGNOSIS — K529 Noninfective gastroenteritis and colitis, unspecified: Secondary | ICD-10-CM | POA: Diagnosis not present

## 2023-05-12 DIAGNOSIS — G311 Senile degeneration of brain, not elsewhere classified: Secondary | ICD-10-CM | POA: Diagnosis not present

## 2023-05-12 DIAGNOSIS — M4302 Spondylolysis, cervical region: Secondary | ICD-10-CM | POA: Diagnosis not present

## 2023-05-12 DIAGNOSIS — R630 Anorexia: Secondary | ICD-10-CM | POA: Diagnosis not present

## 2023-05-12 DIAGNOSIS — F02818 Dementia in other diseases classified elsewhere, unspecified severity, with other behavioral disturbance: Secondary | ICD-10-CM | POA: Diagnosis not present

## 2023-05-12 DIAGNOSIS — K529 Noninfective gastroenteritis and colitis, unspecified: Secondary | ICD-10-CM | POA: Diagnosis not present

## 2023-05-12 DIAGNOSIS — E119 Type 2 diabetes mellitus without complications: Secondary | ICD-10-CM | POA: Diagnosis not present

## 2023-05-13 DIAGNOSIS — M4302 Spondylolysis, cervical region: Secondary | ICD-10-CM | POA: Diagnosis not present

## 2023-05-13 DIAGNOSIS — K529 Noninfective gastroenteritis and colitis, unspecified: Secondary | ICD-10-CM | POA: Diagnosis not present

## 2023-05-13 DIAGNOSIS — G311 Senile degeneration of brain, not elsewhere classified: Secondary | ICD-10-CM | POA: Diagnosis not present

## 2023-05-13 DIAGNOSIS — F02818 Dementia in other diseases classified elsewhere, unspecified severity, with other behavioral disturbance: Secondary | ICD-10-CM | POA: Diagnosis not present

## 2023-05-13 DIAGNOSIS — E119 Type 2 diabetes mellitus without complications: Secondary | ICD-10-CM | POA: Diagnosis not present

## 2023-05-13 DIAGNOSIS — R630 Anorexia: Secondary | ICD-10-CM | POA: Diagnosis not present

## 2023-05-17 DIAGNOSIS — G311 Senile degeneration of brain, not elsewhere classified: Secondary | ICD-10-CM | POA: Diagnosis not present

## 2023-05-17 DIAGNOSIS — R630 Anorexia: Secondary | ICD-10-CM | POA: Diagnosis not present

## 2023-05-17 DIAGNOSIS — F02818 Dementia in other diseases classified elsewhere, unspecified severity, with other behavioral disturbance: Secondary | ICD-10-CM | POA: Diagnosis not present

## 2023-05-17 DIAGNOSIS — K529 Noninfective gastroenteritis and colitis, unspecified: Secondary | ICD-10-CM | POA: Diagnosis not present

## 2023-05-17 DIAGNOSIS — E119 Type 2 diabetes mellitus without complications: Secondary | ICD-10-CM | POA: Diagnosis not present

## 2023-05-17 DIAGNOSIS — M4302 Spondylolysis, cervical region: Secondary | ICD-10-CM | POA: Diagnosis not present

## 2023-05-20 DIAGNOSIS — K529 Noninfective gastroenteritis and colitis, unspecified: Secondary | ICD-10-CM | POA: Diagnosis not present

## 2023-05-20 DIAGNOSIS — G311 Senile degeneration of brain, not elsewhere classified: Secondary | ICD-10-CM | POA: Diagnosis not present

## 2023-05-20 DIAGNOSIS — E119 Type 2 diabetes mellitus without complications: Secondary | ICD-10-CM | POA: Diagnosis not present

## 2023-05-20 DIAGNOSIS — R630 Anorexia: Secondary | ICD-10-CM | POA: Diagnosis not present

## 2023-05-20 DIAGNOSIS — F02818 Dementia in other diseases classified elsewhere, unspecified severity, with other behavioral disturbance: Secondary | ICD-10-CM | POA: Diagnosis not present

## 2023-05-20 DIAGNOSIS — M4302 Spondylolysis, cervical region: Secondary | ICD-10-CM | POA: Diagnosis not present

## 2023-05-21 DIAGNOSIS — M1990 Unspecified osteoarthritis, unspecified site: Secondary | ICD-10-CM | POA: Diagnosis not present

## 2023-05-21 DIAGNOSIS — M4302 Spondylolysis, cervical region: Secondary | ICD-10-CM | POA: Diagnosis not present

## 2023-05-21 DIAGNOSIS — I10 Essential (primary) hypertension: Secondary | ICD-10-CM | POA: Diagnosis not present

## 2023-05-21 DIAGNOSIS — E119 Type 2 diabetes mellitus without complications: Secondary | ICD-10-CM | POA: Diagnosis not present

## 2023-05-21 DIAGNOSIS — G311 Senile degeneration of brain, not elsewhere classified: Secondary | ICD-10-CM | POA: Diagnosis not present

## 2023-05-21 DIAGNOSIS — E039 Hypothyroidism, unspecified: Secondary | ICD-10-CM | POA: Diagnosis not present

## 2023-05-21 DIAGNOSIS — I471 Supraventricular tachycardia, unspecified: Secondary | ICD-10-CM | POA: Diagnosis not present

## 2023-05-21 DIAGNOSIS — R630 Anorexia: Secondary | ICD-10-CM | POA: Diagnosis not present

## 2023-05-21 DIAGNOSIS — K529 Noninfective gastroenteritis and colitis, unspecified: Secondary | ICD-10-CM | POA: Diagnosis not present

## 2023-05-21 DIAGNOSIS — E785 Hyperlipidemia, unspecified: Secondary | ICD-10-CM | POA: Diagnosis not present

## 2023-05-21 DIAGNOSIS — F02818 Dementia in other diseases classified elsewhere, unspecified severity, with other behavioral disturbance: Secondary | ICD-10-CM | POA: Diagnosis not present

## 2023-05-24 DIAGNOSIS — G311 Senile degeneration of brain, not elsewhere classified: Secondary | ICD-10-CM | POA: Diagnosis not present

## 2023-05-24 DIAGNOSIS — E119 Type 2 diabetes mellitus without complications: Secondary | ICD-10-CM | POA: Diagnosis not present

## 2023-05-24 DIAGNOSIS — F02818 Dementia in other diseases classified elsewhere, unspecified severity, with other behavioral disturbance: Secondary | ICD-10-CM | POA: Diagnosis not present

## 2023-05-24 DIAGNOSIS — R630 Anorexia: Secondary | ICD-10-CM | POA: Diagnosis not present

## 2023-05-24 DIAGNOSIS — K529 Noninfective gastroenteritis and colitis, unspecified: Secondary | ICD-10-CM | POA: Diagnosis not present

## 2023-05-24 DIAGNOSIS — M4302 Spondylolysis, cervical region: Secondary | ICD-10-CM | POA: Diagnosis not present

## 2023-05-26 DIAGNOSIS — M4302 Spondylolysis, cervical region: Secondary | ICD-10-CM | POA: Diagnosis not present

## 2023-05-26 DIAGNOSIS — E119 Type 2 diabetes mellitus without complications: Secondary | ICD-10-CM | POA: Diagnosis not present

## 2023-05-26 DIAGNOSIS — R630 Anorexia: Secondary | ICD-10-CM | POA: Diagnosis not present

## 2023-05-26 DIAGNOSIS — F02818 Dementia in other diseases classified elsewhere, unspecified severity, with other behavioral disturbance: Secondary | ICD-10-CM | POA: Diagnosis not present

## 2023-05-26 DIAGNOSIS — K529 Noninfective gastroenteritis and colitis, unspecified: Secondary | ICD-10-CM | POA: Diagnosis not present

## 2023-05-26 DIAGNOSIS — G311 Senile degeneration of brain, not elsewhere classified: Secondary | ICD-10-CM | POA: Diagnosis not present

## 2023-06-01 DIAGNOSIS — R630 Anorexia: Secondary | ICD-10-CM | POA: Diagnosis not present

## 2023-06-01 DIAGNOSIS — F02818 Dementia in other diseases classified elsewhere, unspecified severity, with other behavioral disturbance: Secondary | ICD-10-CM | POA: Diagnosis not present

## 2023-06-01 DIAGNOSIS — K529 Noninfective gastroenteritis and colitis, unspecified: Secondary | ICD-10-CM | POA: Diagnosis not present

## 2023-06-01 DIAGNOSIS — G311 Senile degeneration of brain, not elsewhere classified: Secondary | ICD-10-CM | POA: Diagnosis not present

## 2023-06-01 DIAGNOSIS — E119 Type 2 diabetes mellitus without complications: Secondary | ICD-10-CM | POA: Diagnosis not present

## 2023-06-01 DIAGNOSIS — M4302 Spondylolysis, cervical region: Secondary | ICD-10-CM | POA: Diagnosis not present

## 2023-06-03 DIAGNOSIS — E119 Type 2 diabetes mellitus without complications: Secondary | ICD-10-CM | POA: Diagnosis not present

## 2023-06-03 DIAGNOSIS — F02818 Dementia in other diseases classified elsewhere, unspecified severity, with other behavioral disturbance: Secondary | ICD-10-CM | POA: Diagnosis not present

## 2023-06-03 DIAGNOSIS — M4302 Spondylolysis, cervical region: Secondary | ICD-10-CM | POA: Diagnosis not present

## 2023-06-03 DIAGNOSIS — R630 Anorexia: Secondary | ICD-10-CM | POA: Diagnosis not present

## 2023-06-03 DIAGNOSIS — K529 Noninfective gastroenteritis and colitis, unspecified: Secondary | ICD-10-CM | POA: Diagnosis not present

## 2023-06-03 DIAGNOSIS — G311 Senile degeneration of brain, not elsewhere classified: Secondary | ICD-10-CM | POA: Diagnosis not present

## 2023-06-09 DIAGNOSIS — E119 Type 2 diabetes mellitus without complications: Secondary | ICD-10-CM | POA: Diagnosis not present

## 2023-06-09 DIAGNOSIS — R630 Anorexia: Secondary | ICD-10-CM | POA: Diagnosis not present

## 2023-06-09 DIAGNOSIS — G311 Senile degeneration of brain, not elsewhere classified: Secondary | ICD-10-CM | POA: Diagnosis not present

## 2023-06-09 DIAGNOSIS — F02818 Dementia in other diseases classified elsewhere, unspecified severity, with other behavioral disturbance: Secondary | ICD-10-CM | POA: Diagnosis not present

## 2023-06-09 DIAGNOSIS — M4302 Spondylolysis, cervical region: Secondary | ICD-10-CM | POA: Diagnosis not present

## 2023-06-09 DIAGNOSIS — K529 Noninfective gastroenteritis and colitis, unspecified: Secondary | ICD-10-CM | POA: Diagnosis not present

## 2023-06-13 DIAGNOSIS — G311 Senile degeneration of brain, not elsewhere classified: Secondary | ICD-10-CM | POA: Diagnosis not present

## 2023-06-13 DIAGNOSIS — K529 Noninfective gastroenteritis and colitis, unspecified: Secondary | ICD-10-CM | POA: Diagnosis not present

## 2023-06-13 DIAGNOSIS — R630 Anorexia: Secondary | ICD-10-CM | POA: Diagnosis not present

## 2023-06-13 DIAGNOSIS — F02818 Dementia in other diseases classified elsewhere, unspecified severity, with other behavioral disturbance: Secondary | ICD-10-CM | POA: Diagnosis not present

## 2023-06-13 DIAGNOSIS — E119 Type 2 diabetes mellitus without complications: Secondary | ICD-10-CM | POA: Diagnosis not present

## 2023-06-13 DIAGNOSIS — M4302 Spondylolysis, cervical region: Secondary | ICD-10-CM | POA: Diagnosis not present

## 2023-06-14 DIAGNOSIS — K529 Noninfective gastroenteritis and colitis, unspecified: Secondary | ICD-10-CM | POA: Diagnosis not present

## 2023-06-14 DIAGNOSIS — G311 Senile degeneration of brain, not elsewhere classified: Secondary | ICD-10-CM | POA: Diagnosis not present

## 2023-06-14 DIAGNOSIS — E119 Type 2 diabetes mellitus without complications: Secondary | ICD-10-CM | POA: Diagnosis not present

## 2023-06-14 DIAGNOSIS — F02818 Dementia in other diseases classified elsewhere, unspecified severity, with other behavioral disturbance: Secondary | ICD-10-CM | POA: Diagnosis not present

## 2023-06-14 DIAGNOSIS — R630 Anorexia: Secondary | ICD-10-CM | POA: Diagnosis not present

## 2023-06-14 DIAGNOSIS — M4302 Spondylolysis, cervical region: Secondary | ICD-10-CM | POA: Diagnosis not present

## 2023-06-16 DIAGNOSIS — F02818 Dementia in other diseases classified elsewhere, unspecified severity, with other behavioral disturbance: Secondary | ICD-10-CM | POA: Diagnosis not present

## 2023-06-16 DIAGNOSIS — M4302 Spondylolysis, cervical region: Secondary | ICD-10-CM | POA: Diagnosis not present

## 2023-06-16 DIAGNOSIS — G311 Senile degeneration of brain, not elsewhere classified: Secondary | ICD-10-CM | POA: Diagnosis not present

## 2023-06-16 DIAGNOSIS — R630 Anorexia: Secondary | ICD-10-CM | POA: Diagnosis not present

## 2023-06-16 DIAGNOSIS — K529 Noninfective gastroenteritis and colitis, unspecified: Secondary | ICD-10-CM | POA: Diagnosis not present

## 2023-06-16 DIAGNOSIS — E119 Type 2 diabetes mellitus without complications: Secondary | ICD-10-CM | POA: Diagnosis not present

## 2023-06-18 DIAGNOSIS — G311 Senile degeneration of brain, not elsewhere classified: Secondary | ICD-10-CM | POA: Diagnosis not present

## 2023-06-18 DIAGNOSIS — E039 Hypothyroidism, unspecified: Secondary | ICD-10-CM | POA: Diagnosis not present

## 2023-06-18 DIAGNOSIS — M4302 Spondylolysis, cervical region: Secondary | ICD-10-CM | POA: Diagnosis not present

## 2023-06-18 DIAGNOSIS — E119 Type 2 diabetes mellitus without complications: Secondary | ICD-10-CM | POA: Diagnosis not present

## 2023-06-18 DIAGNOSIS — E785 Hyperlipidemia, unspecified: Secondary | ICD-10-CM | POA: Diagnosis not present

## 2023-06-18 DIAGNOSIS — K529 Noninfective gastroenteritis and colitis, unspecified: Secondary | ICD-10-CM | POA: Diagnosis not present

## 2023-06-18 DIAGNOSIS — I10 Essential (primary) hypertension: Secondary | ICD-10-CM | POA: Diagnosis not present

## 2023-06-18 DIAGNOSIS — I471 Supraventricular tachycardia, unspecified: Secondary | ICD-10-CM | POA: Diagnosis not present

## 2023-06-18 DIAGNOSIS — M1990 Unspecified osteoarthritis, unspecified site: Secondary | ICD-10-CM | POA: Diagnosis not present

## 2023-06-18 DIAGNOSIS — R630 Anorexia: Secondary | ICD-10-CM | POA: Diagnosis not present

## 2023-06-18 DIAGNOSIS — F02818 Dementia in other diseases classified elsewhere, unspecified severity, with other behavioral disturbance: Secondary | ICD-10-CM | POA: Diagnosis not present

## 2023-06-21 DIAGNOSIS — E119 Type 2 diabetes mellitus without complications: Secondary | ICD-10-CM | POA: Diagnosis not present

## 2023-06-21 DIAGNOSIS — F02818 Dementia in other diseases classified elsewhere, unspecified severity, with other behavioral disturbance: Secondary | ICD-10-CM | POA: Diagnosis not present

## 2023-06-21 DIAGNOSIS — R630 Anorexia: Secondary | ICD-10-CM | POA: Diagnosis not present

## 2023-06-21 DIAGNOSIS — M4302 Spondylolysis, cervical region: Secondary | ICD-10-CM | POA: Diagnosis not present

## 2023-06-21 DIAGNOSIS — G311 Senile degeneration of brain, not elsewhere classified: Secondary | ICD-10-CM | POA: Diagnosis not present

## 2023-06-21 DIAGNOSIS — K529 Noninfective gastroenteritis and colitis, unspecified: Secondary | ICD-10-CM | POA: Diagnosis not present

## 2023-06-24 DIAGNOSIS — G311 Senile degeneration of brain, not elsewhere classified: Secondary | ICD-10-CM | POA: Diagnosis not present

## 2023-06-24 DIAGNOSIS — K529 Noninfective gastroenteritis and colitis, unspecified: Secondary | ICD-10-CM | POA: Diagnosis not present

## 2023-06-24 DIAGNOSIS — F02818 Dementia in other diseases classified elsewhere, unspecified severity, with other behavioral disturbance: Secondary | ICD-10-CM | POA: Diagnosis not present

## 2023-06-24 DIAGNOSIS — E119 Type 2 diabetes mellitus without complications: Secondary | ICD-10-CM | POA: Diagnosis not present

## 2023-06-24 DIAGNOSIS — R630 Anorexia: Secondary | ICD-10-CM | POA: Diagnosis not present

## 2023-06-24 DIAGNOSIS — M4302 Spondylolysis, cervical region: Secondary | ICD-10-CM | POA: Diagnosis not present

## 2023-06-29 DIAGNOSIS — M4302 Spondylolysis, cervical region: Secondary | ICD-10-CM | POA: Diagnosis not present

## 2023-06-29 DIAGNOSIS — F02818 Dementia in other diseases classified elsewhere, unspecified severity, with other behavioral disturbance: Secondary | ICD-10-CM | POA: Diagnosis not present

## 2023-06-29 DIAGNOSIS — G311 Senile degeneration of brain, not elsewhere classified: Secondary | ICD-10-CM | POA: Diagnosis not present

## 2023-06-29 DIAGNOSIS — E119 Type 2 diabetes mellitus without complications: Secondary | ICD-10-CM | POA: Diagnosis not present

## 2023-06-29 DIAGNOSIS — K529 Noninfective gastroenteritis and colitis, unspecified: Secondary | ICD-10-CM | POA: Diagnosis not present

## 2023-06-29 DIAGNOSIS — R630 Anorexia: Secondary | ICD-10-CM | POA: Diagnosis not present

## 2023-07-05 DIAGNOSIS — E119 Type 2 diabetes mellitus without complications: Secondary | ICD-10-CM | POA: Diagnosis not present

## 2023-07-05 DIAGNOSIS — M4302 Spondylolysis, cervical region: Secondary | ICD-10-CM | POA: Diagnosis not present

## 2023-07-05 DIAGNOSIS — G311 Senile degeneration of brain, not elsewhere classified: Secondary | ICD-10-CM | POA: Diagnosis not present

## 2023-07-05 DIAGNOSIS — F02818 Dementia in other diseases classified elsewhere, unspecified severity, with other behavioral disturbance: Secondary | ICD-10-CM | POA: Diagnosis not present

## 2023-07-05 DIAGNOSIS — K529 Noninfective gastroenteritis and colitis, unspecified: Secondary | ICD-10-CM | POA: Diagnosis not present

## 2023-07-05 DIAGNOSIS — R630 Anorexia: Secondary | ICD-10-CM | POA: Diagnosis not present

## 2023-07-11 DIAGNOSIS — R630 Anorexia: Secondary | ICD-10-CM | POA: Diagnosis not present

## 2023-07-11 DIAGNOSIS — E119 Type 2 diabetes mellitus without complications: Secondary | ICD-10-CM | POA: Diagnosis not present

## 2023-07-11 DIAGNOSIS — G311 Senile degeneration of brain, not elsewhere classified: Secondary | ICD-10-CM | POA: Diagnosis not present

## 2023-07-11 DIAGNOSIS — M4302 Spondylolysis, cervical region: Secondary | ICD-10-CM | POA: Diagnosis not present

## 2023-07-11 DIAGNOSIS — K529 Noninfective gastroenteritis and colitis, unspecified: Secondary | ICD-10-CM | POA: Diagnosis not present

## 2023-07-11 DIAGNOSIS — F02818 Dementia in other diseases classified elsewhere, unspecified severity, with other behavioral disturbance: Secondary | ICD-10-CM | POA: Diagnosis not present

## 2023-07-19 DIAGNOSIS — I471 Supraventricular tachycardia, unspecified: Secondary | ICD-10-CM | POA: Diagnosis not present

## 2023-07-19 DIAGNOSIS — M1990 Unspecified osteoarthritis, unspecified site: Secondary | ICD-10-CM | POA: Diagnosis not present

## 2023-07-19 DIAGNOSIS — G311 Senile degeneration of brain, not elsewhere classified: Secondary | ICD-10-CM | POA: Diagnosis not present

## 2023-07-19 DIAGNOSIS — K529 Noninfective gastroenteritis and colitis, unspecified: Secondary | ICD-10-CM | POA: Diagnosis not present

## 2023-07-19 DIAGNOSIS — R630 Anorexia: Secondary | ICD-10-CM | POA: Diagnosis not present

## 2023-07-19 DIAGNOSIS — F02818 Dementia in other diseases classified elsewhere, unspecified severity, with other behavioral disturbance: Secondary | ICD-10-CM | POA: Diagnosis not present

## 2023-07-19 DIAGNOSIS — E039 Hypothyroidism, unspecified: Secondary | ICD-10-CM | POA: Diagnosis not present

## 2023-07-19 DIAGNOSIS — I10 Essential (primary) hypertension: Secondary | ICD-10-CM | POA: Diagnosis not present

## 2023-07-19 DIAGNOSIS — E785 Hyperlipidemia, unspecified: Secondary | ICD-10-CM | POA: Diagnosis not present

## 2023-07-19 DIAGNOSIS — M4302 Spondylolysis, cervical region: Secondary | ICD-10-CM | POA: Diagnosis not present

## 2023-07-19 DIAGNOSIS — E119 Type 2 diabetes mellitus without complications: Secondary | ICD-10-CM | POA: Diagnosis not present

## 2023-07-20 DIAGNOSIS — E119 Type 2 diabetes mellitus without complications: Secondary | ICD-10-CM | POA: Diagnosis not present

## 2023-07-20 DIAGNOSIS — F02818 Dementia in other diseases classified elsewhere, unspecified severity, with other behavioral disturbance: Secondary | ICD-10-CM | POA: Diagnosis not present

## 2023-07-20 DIAGNOSIS — G311 Senile degeneration of brain, not elsewhere classified: Secondary | ICD-10-CM | POA: Diagnosis not present

## 2023-07-20 DIAGNOSIS — R630 Anorexia: Secondary | ICD-10-CM | POA: Diagnosis not present

## 2023-07-20 DIAGNOSIS — K529 Noninfective gastroenteritis and colitis, unspecified: Secondary | ICD-10-CM | POA: Diagnosis not present

## 2023-07-20 DIAGNOSIS — M4302 Spondylolysis, cervical region: Secondary | ICD-10-CM | POA: Diagnosis not present

## 2023-07-26 DIAGNOSIS — G311 Senile degeneration of brain, not elsewhere classified: Secondary | ICD-10-CM | POA: Diagnosis not present

## 2023-07-26 DIAGNOSIS — F02818 Dementia in other diseases classified elsewhere, unspecified severity, with other behavioral disturbance: Secondary | ICD-10-CM | POA: Diagnosis not present

## 2023-07-26 DIAGNOSIS — M4302 Spondylolysis, cervical region: Secondary | ICD-10-CM | POA: Diagnosis not present

## 2023-07-26 DIAGNOSIS — K529 Noninfective gastroenteritis and colitis, unspecified: Secondary | ICD-10-CM | POA: Diagnosis not present

## 2023-07-26 DIAGNOSIS — E119 Type 2 diabetes mellitus without complications: Secondary | ICD-10-CM | POA: Diagnosis not present

## 2023-07-26 DIAGNOSIS — R630 Anorexia: Secondary | ICD-10-CM | POA: Diagnosis not present

## 2023-08-02 DIAGNOSIS — F02818 Dementia in other diseases classified elsewhere, unspecified severity, with other behavioral disturbance: Secondary | ICD-10-CM | POA: Diagnosis not present

## 2023-08-02 DIAGNOSIS — K529 Noninfective gastroenteritis and colitis, unspecified: Secondary | ICD-10-CM | POA: Diagnosis not present

## 2023-08-02 DIAGNOSIS — G311 Senile degeneration of brain, not elsewhere classified: Secondary | ICD-10-CM | POA: Diagnosis not present

## 2023-08-02 DIAGNOSIS — E119 Type 2 diabetes mellitus without complications: Secondary | ICD-10-CM | POA: Diagnosis not present

## 2023-08-02 DIAGNOSIS — R630 Anorexia: Secondary | ICD-10-CM | POA: Diagnosis not present

## 2023-08-02 DIAGNOSIS — M4302 Spondylolysis, cervical region: Secondary | ICD-10-CM | POA: Diagnosis not present

## 2023-08-05 DIAGNOSIS — F02818 Dementia in other diseases classified elsewhere, unspecified severity, with other behavioral disturbance: Secondary | ICD-10-CM | POA: Diagnosis not present

## 2023-08-05 DIAGNOSIS — G311 Senile degeneration of brain, not elsewhere classified: Secondary | ICD-10-CM | POA: Diagnosis not present

## 2023-08-05 DIAGNOSIS — K529 Noninfective gastroenteritis and colitis, unspecified: Secondary | ICD-10-CM | POA: Diagnosis not present

## 2023-08-05 DIAGNOSIS — M4302 Spondylolysis, cervical region: Secondary | ICD-10-CM | POA: Diagnosis not present

## 2023-08-05 DIAGNOSIS — E119 Type 2 diabetes mellitus without complications: Secondary | ICD-10-CM | POA: Diagnosis not present

## 2023-08-05 DIAGNOSIS — R630 Anorexia: Secondary | ICD-10-CM | POA: Diagnosis not present

## 2023-08-09 DIAGNOSIS — K529 Noninfective gastroenteritis and colitis, unspecified: Secondary | ICD-10-CM | POA: Diagnosis not present

## 2023-08-09 DIAGNOSIS — G311 Senile degeneration of brain, not elsewhere classified: Secondary | ICD-10-CM | POA: Diagnosis not present

## 2023-08-09 DIAGNOSIS — F02818 Dementia in other diseases classified elsewhere, unspecified severity, with other behavioral disturbance: Secondary | ICD-10-CM | POA: Diagnosis not present

## 2023-08-09 DIAGNOSIS — R630 Anorexia: Secondary | ICD-10-CM | POA: Diagnosis not present

## 2023-08-09 DIAGNOSIS — M4302 Spondylolysis, cervical region: Secondary | ICD-10-CM | POA: Diagnosis not present

## 2023-08-09 DIAGNOSIS — E119 Type 2 diabetes mellitus without complications: Secondary | ICD-10-CM | POA: Diagnosis not present

## 2023-08-12 DIAGNOSIS — R630 Anorexia: Secondary | ICD-10-CM | POA: Diagnosis not present

## 2023-08-12 DIAGNOSIS — E119 Type 2 diabetes mellitus without complications: Secondary | ICD-10-CM | POA: Diagnosis not present

## 2023-08-12 DIAGNOSIS — G311 Senile degeneration of brain, not elsewhere classified: Secondary | ICD-10-CM | POA: Diagnosis not present

## 2023-08-12 DIAGNOSIS — F02818 Dementia in other diseases classified elsewhere, unspecified severity, with other behavioral disturbance: Secondary | ICD-10-CM | POA: Diagnosis not present

## 2023-08-12 DIAGNOSIS — K529 Noninfective gastroenteritis and colitis, unspecified: Secondary | ICD-10-CM | POA: Diagnosis not present

## 2023-08-12 DIAGNOSIS — M4302 Spondylolysis, cervical region: Secondary | ICD-10-CM | POA: Diagnosis not present

## 2023-08-16 DIAGNOSIS — M4302 Spondylolysis, cervical region: Secondary | ICD-10-CM | POA: Diagnosis not present

## 2023-08-16 DIAGNOSIS — K529 Noninfective gastroenteritis and colitis, unspecified: Secondary | ICD-10-CM | POA: Diagnosis not present

## 2023-08-16 DIAGNOSIS — G311 Senile degeneration of brain, not elsewhere classified: Secondary | ICD-10-CM | POA: Diagnosis not present

## 2023-08-16 DIAGNOSIS — F02818 Dementia in other diseases classified elsewhere, unspecified severity, with other behavioral disturbance: Secondary | ICD-10-CM | POA: Diagnosis not present

## 2023-08-16 DIAGNOSIS — E119 Type 2 diabetes mellitus without complications: Secondary | ICD-10-CM | POA: Diagnosis not present

## 2023-08-16 DIAGNOSIS — R630 Anorexia: Secondary | ICD-10-CM | POA: Diagnosis not present

## 2023-08-18 DIAGNOSIS — I471 Supraventricular tachycardia, unspecified: Secondary | ICD-10-CM | POA: Diagnosis not present

## 2023-08-18 DIAGNOSIS — E039 Hypothyroidism, unspecified: Secondary | ICD-10-CM | POA: Diagnosis not present

## 2023-08-18 DIAGNOSIS — I10 Essential (primary) hypertension: Secondary | ICD-10-CM | POA: Diagnosis not present

## 2023-08-18 DIAGNOSIS — R630 Anorexia: Secondary | ICD-10-CM | POA: Diagnosis not present

## 2023-08-18 DIAGNOSIS — G311 Senile degeneration of brain, not elsewhere classified: Secondary | ICD-10-CM | POA: Diagnosis not present

## 2023-08-18 DIAGNOSIS — F02818 Dementia in other diseases classified elsewhere, unspecified severity, with other behavioral disturbance: Secondary | ICD-10-CM | POA: Diagnosis not present

## 2023-08-18 DIAGNOSIS — E119 Type 2 diabetes mellitus without complications: Secondary | ICD-10-CM | POA: Diagnosis not present

## 2023-08-18 DIAGNOSIS — K529 Noninfective gastroenteritis and colitis, unspecified: Secondary | ICD-10-CM | POA: Diagnosis not present

## 2023-08-18 DIAGNOSIS — M4302 Spondylolysis, cervical region: Secondary | ICD-10-CM | POA: Diagnosis not present

## 2023-08-18 DIAGNOSIS — M1990 Unspecified osteoarthritis, unspecified site: Secondary | ICD-10-CM | POA: Diagnosis not present

## 2023-08-18 DIAGNOSIS — E785 Hyperlipidemia, unspecified: Secondary | ICD-10-CM | POA: Diagnosis not present

## 2023-08-19 DIAGNOSIS — F02818 Dementia in other diseases classified elsewhere, unspecified severity, with other behavioral disturbance: Secondary | ICD-10-CM | POA: Diagnosis not present

## 2023-08-19 DIAGNOSIS — M4302 Spondylolysis, cervical region: Secondary | ICD-10-CM | POA: Diagnosis not present

## 2023-08-19 DIAGNOSIS — G311 Senile degeneration of brain, not elsewhere classified: Secondary | ICD-10-CM | POA: Diagnosis not present

## 2023-08-19 DIAGNOSIS — E119 Type 2 diabetes mellitus without complications: Secondary | ICD-10-CM | POA: Diagnosis not present

## 2023-08-19 DIAGNOSIS — R630 Anorexia: Secondary | ICD-10-CM | POA: Diagnosis not present

## 2023-08-19 DIAGNOSIS — K529 Noninfective gastroenteritis and colitis, unspecified: Secondary | ICD-10-CM | POA: Diagnosis not present

## 2023-08-23 DIAGNOSIS — K529 Noninfective gastroenteritis and colitis, unspecified: Secondary | ICD-10-CM | POA: Diagnosis not present

## 2023-08-23 DIAGNOSIS — E119 Type 2 diabetes mellitus without complications: Secondary | ICD-10-CM | POA: Diagnosis not present

## 2023-08-23 DIAGNOSIS — G311 Senile degeneration of brain, not elsewhere classified: Secondary | ICD-10-CM | POA: Diagnosis not present

## 2023-08-23 DIAGNOSIS — R630 Anorexia: Secondary | ICD-10-CM | POA: Diagnosis not present

## 2023-08-23 DIAGNOSIS — M4302 Spondylolysis, cervical region: Secondary | ICD-10-CM | POA: Diagnosis not present

## 2023-08-23 DIAGNOSIS — F02818 Dementia in other diseases classified elsewhere, unspecified severity, with other behavioral disturbance: Secondary | ICD-10-CM | POA: Diagnosis not present

## 2023-08-26 DIAGNOSIS — R630 Anorexia: Secondary | ICD-10-CM | POA: Diagnosis not present

## 2023-08-26 DIAGNOSIS — G311 Senile degeneration of brain, not elsewhere classified: Secondary | ICD-10-CM | POA: Diagnosis not present

## 2023-08-26 DIAGNOSIS — M4302 Spondylolysis, cervical region: Secondary | ICD-10-CM | POA: Diagnosis not present

## 2023-08-26 DIAGNOSIS — F02818 Dementia in other diseases classified elsewhere, unspecified severity, with other behavioral disturbance: Secondary | ICD-10-CM | POA: Diagnosis not present

## 2023-08-26 DIAGNOSIS — K529 Noninfective gastroenteritis and colitis, unspecified: Secondary | ICD-10-CM | POA: Diagnosis not present

## 2023-08-26 DIAGNOSIS — E119 Type 2 diabetes mellitus without complications: Secondary | ICD-10-CM | POA: Diagnosis not present

## 2023-08-30 DIAGNOSIS — F02818 Dementia in other diseases classified elsewhere, unspecified severity, with other behavioral disturbance: Secondary | ICD-10-CM | POA: Diagnosis not present

## 2023-08-30 DIAGNOSIS — G311 Senile degeneration of brain, not elsewhere classified: Secondary | ICD-10-CM | POA: Diagnosis not present

## 2023-08-30 DIAGNOSIS — K529 Noninfective gastroenteritis and colitis, unspecified: Secondary | ICD-10-CM | POA: Diagnosis not present

## 2023-08-30 DIAGNOSIS — M4302 Spondylolysis, cervical region: Secondary | ICD-10-CM | POA: Diagnosis not present

## 2023-08-30 DIAGNOSIS — E119 Type 2 diabetes mellitus without complications: Secondary | ICD-10-CM | POA: Diagnosis not present

## 2023-08-30 DIAGNOSIS — R630 Anorexia: Secondary | ICD-10-CM | POA: Diagnosis not present

## 2023-09-02 DIAGNOSIS — E119 Type 2 diabetes mellitus without complications: Secondary | ICD-10-CM | POA: Diagnosis not present

## 2023-09-02 DIAGNOSIS — R630 Anorexia: Secondary | ICD-10-CM | POA: Diagnosis not present

## 2023-09-02 DIAGNOSIS — G311 Senile degeneration of brain, not elsewhere classified: Secondary | ICD-10-CM | POA: Diagnosis not present

## 2023-09-02 DIAGNOSIS — M4302 Spondylolysis, cervical region: Secondary | ICD-10-CM | POA: Diagnosis not present

## 2023-09-02 DIAGNOSIS — F02818 Dementia in other diseases classified elsewhere, unspecified severity, with other behavioral disturbance: Secondary | ICD-10-CM | POA: Diagnosis not present

## 2023-09-02 DIAGNOSIS — K529 Noninfective gastroenteritis and colitis, unspecified: Secondary | ICD-10-CM | POA: Diagnosis not present

## 2023-09-06 DIAGNOSIS — E119 Type 2 diabetes mellitus without complications: Secondary | ICD-10-CM | POA: Diagnosis not present

## 2023-09-06 DIAGNOSIS — R630 Anorexia: Secondary | ICD-10-CM | POA: Diagnosis not present

## 2023-09-06 DIAGNOSIS — F02818 Dementia in other diseases classified elsewhere, unspecified severity, with other behavioral disturbance: Secondary | ICD-10-CM | POA: Diagnosis not present

## 2023-09-06 DIAGNOSIS — M4302 Spondylolysis, cervical region: Secondary | ICD-10-CM | POA: Diagnosis not present

## 2023-09-06 DIAGNOSIS — G311 Senile degeneration of brain, not elsewhere classified: Secondary | ICD-10-CM | POA: Diagnosis not present

## 2023-09-06 DIAGNOSIS — K529 Noninfective gastroenteritis and colitis, unspecified: Secondary | ICD-10-CM | POA: Diagnosis not present

## 2023-09-07 DIAGNOSIS — M4302 Spondylolysis, cervical region: Secondary | ICD-10-CM | POA: Diagnosis not present

## 2023-09-07 DIAGNOSIS — G311 Senile degeneration of brain, not elsewhere classified: Secondary | ICD-10-CM | POA: Diagnosis not present

## 2023-09-07 DIAGNOSIS — R630 Anorexia: Secondary | ICD-10-CM | POA: Diagnosis not present

## 2023-09-07 DIAGNOSIS — F02818 Dementia in other diseases classified elsewhere, unspecified severity, with other behavioral disturbance: Secondary | ICD-10-CM | POA: Diagnosis not present

## 2023-09-07 DIAGNOSIS — K529 Noninfective gastroenteritis and colitis, unspecified: Secondary | ICD-10-CM | POA: Diagnosis not present

## 2023-09-07 DIAGNOSIS — E119 Type 2 diabetes mellitus without complications: Secondary | ICD-10-CM | POA: Diagnosis not present

## 2023-09-09 DIAGNOSIS — G311 Senile degeneration of brain, not elsewhere classified: Secondary | ICD-10-CM | POA: Diagnosis not present

## 2023-09-09 DIAGNOSIS — E119 Type 2 diabetes mellitus without complications: Secondary | ICD-10-CM | POA: Diagnosis not present

## 2023-09-09 DIAGNOSIS — F02818 Dementia in other diseases classified elsewhere, unspecified severity, with other behavioral disturbance: Secondary | ICD-10-CM | POA: Diagnosis not present

## 2023-09-09 DIAGNOSIS — K529 Noninfective gastroenteritis and colitis, unspecified: Secondary | ICD-10-CM | POA: Diagnosis not present

## 2023-09-09 DIAGNOSIS — M4302 Spondylolysis, cervical region: Secondary | ICD-10-CM | POA: Diagnosis not present

## 2023-09-09 DIAGNOSIS — R630 Anorexia: Secondary | ICD-10-CM | POA: Diagnosis not present

## 2023-09-13 DIAGNOSIS — K529 Noninfective gastroenteritis and colitis, unspecified: Secondary | ICD-10-CM | POA: Diagnosis not present

## 2023-09-13 DIAGNOSIS — R630 Anorexia: Secondary | ICD-10-CM | POA: Diagnosis not present

## 2023-09-13 DIAGNOSIS — F02818 Dementia in other diseases classified elsewhere, unspecified severity, with other behavioral disturbance: Secondary | ICD-10-CM | POA: Diagnosis not present

## 2023-09-13 DIAGNOSIS — M4302 Spondylolysis, cervical region: Secondary | ICD-10-CM | POA: Diagnosis not present

## 2023-09-13 DIAGNOSIS — E119 Type 2 diabetes mellitus without complications: Secondary | ICD-10-CM | POA: Diagnosis not present

## 2023-09-13 DIAGNOSIS — G311 Senile degeneration of brain, not elsewhere classified: Secondary | ICD-10-CM | POA: Diagnosis not present

## 2023-09-16 DIAGNOSIS — E119 Type 2 diabetes mellitus without complications: Secondary | ICD-10-CM | POA: Diagnosis not present

## 2023-09-16 DIAGNOSIS — F02818 Dementia in other diseases classified elsewhere, unspecified severity, with other behavioral disturbance: Secondary | ICD-10-CM | POA: Diagnosis not present

## 2023-09-16 DIAGNOSIS — R630 Anorexia: Secondary | ICD-10-CM | POA: Diagnosis not present

## 2023-09-16 DIAGNOSIS — K529 Noninfective gastroenteritis and colitis, unspecified: Secondary | ICD-10-CM | POA: Diagnosis not present

## 2023-09-16 DIAGNOSIS — G311 Senile degeneration of brain, not elsewhere classified: Secondary | ICD-10-CM | POA: Diagnosis not present

## 2023-09-16 DIAGNOSIS — M4302 Spondylolysis, cervical region: Secondary | ICD-10-CM | POA: Diagnosis not present

## 2023-09-18 DIAGNOSIS — K529 Noninfective gastroenteritis and colitis, unspecified: Secondary | ICD-10-CM | POA: Diagnosis not present

## 2023-09-18 DIAGNOSIS — I10 Essential (primary) hypertension: Secondary | ICD-10-CM | POA: Diagnosis not present

## 2023-09-18 DIAGNOSIS — M1990 Unspecified osteoarthritis, unspecified site: Secondary | ICD-10-CM | POA: Diagnosis not present

## 2023-09-18 DIAGNOSIS — E785 Hyperlipidemia, unspecified: Secondary | ICD-10-CM | POA: Diagnosis not present

## 2023-09-18 DIAGNOSIS — E039 Hypothyroidism, unspecified: Secondary | ICD-10-CM | POA: Diagnosis not present

## 2023-09-18 DIAGNOSIS — I471 Supraventricular tachycardia, unspecified: Secondary | ICD-10-CM | POA: Diagnosis not present

## 2023-09-18 DIAGNOSIS — M4302 Spondylolysis, cervical region: Secondary | ICD-10-CM | POA: Diagnosis not present

## 2023-09-18 DIAGNOSIS — R630 Anorexia: Secondary | ICD-10-CM | POA: Diagnosis not present

## 2023-09-18 DIAGNOSIS — E119 Type 2 diabetes mellitus without complications: Secondary | ICD-10-CM | POA: Diagnosis not present

## 2023-09-18 DIAGNOSIS — G311 Senile degeneration of brain, not elsewhere classified: Secondary | ICD-10-CM | POA: Diagnosis not present

## 2023-09-18 DIAGNOSIS — F02818 Dementia in other diseases classified elsewhere, unspecified severity, with other behavioral disturbance: Secondary | ICD-10-CM | POA: Diagnosis not present

## 2023-09-19 DIAGNOSIS — R630 Anorexia: Secondary | ICD-10-CM | POA: Diagnosis not present

## 2023-09-19 DIAGNOSIS — K529 Noninfective gastroenteritis and colitis, unspecified: Secondary | ICD-10-CM | POA: Diagnosis not present

## 2023-09-19 DIAGNOSIS — E119 Type 2 diabetes mellitus without complications: Secondary | ICD-10-CM | POA: Diagnosis not present

## 2023-09-19 DIAGNOSIS — M4302 Spondylolysis, cervical region: Secondary | ICD-10-CM | POA: Diagnosis not present

## 2023-09-19 DIAGNOSIS — F02818 Dementia in other diseases classified elsewhere, unspecified severity, with other behavioral disturbance: Secondary | ICD-10-CM | POA: Diagnosis not present

## 2023-09-19 DIAGNOSIS — G311 Senile degeneration of brain, not elsewhere classified: Secondary | ICD-10-CM | POA: Diagnosis not present

## 2023-09-20 DIAGNOSIS — R630 Anorexia: Secondary | ICD-10-CM | POA: Diagnosis not present

## 2023-09-20 DIAGNOSIS — M4302 Spondylolysis, cervical region: Secondary | ICD-10-CM | POA: Diagnosis not present

## 2023-09-20 DIAGNOSIS — F02818 Dementia in other diseases classified elsewhere, unspecified severity, with other behavioral disturbance: Secondary | ICD-10-CM | POA: Diagnosis not present

## 2023-09-20 DIAGNOSIS — K529 Noninfective gastroenteritis and colitis, unspecified: Secondary | ICD-10-CM | POA: Diagnosis not present

## 2023-09-20 DIAGNOSIS — G311 Senile degeneration of brain, not elsewhere classified: Secondary | ICD-10-CM | POA: Diagnosis not present

## 2023-09-20 DIAGNOSIS — E119 Type 2 diabetes mellitus without complications: Secondary | ICD-10-CM | POA: Diagnosis not present

## 2023-09-23 DIAGNOSIS — R630 Anorexia: Secondary | ICD-10-CM | POA: Diagnosis not present

## 2023-09-23 DIAGNOSIS — M4302 Spondylolysis, cervical region: Secondary | ICD-10-CM | POA: Diagnosis not present

## 2023-09-23 DIAGNOSIS — K529 Noninfective gastroenteritis and colitis, unspecified: Secondary | ICD-10-CM | POA: Diagnosis not present

## 2023-09-23 DIAGNOSIS — G311 Senile degeneration of brain, not elsewhere classified: Secondary | ICD-10-CM | POA: Diagnosis not present

## 2023-09-23 DIAGNOSIS — F02818 Dementia in other diseases classified elsewhere, unspecified severity, with other behavioral disturbance: Secondary | ICD-10-CM | POA: Diagnosis not present

## 2023-09-23 DIAGNOSIS — E119 Type 2 diabetes mellitus without complications: Secondary | ICD-10-CM | POA: Diagnosis not present

## 2023-09-27 DIAGNOSIS — G311 Senile degeneration of brain, not elsewhere classified: Secondary | ICD-10-CM | POA: Diagnosis not present

## 2023-09-27 DIAGNOSIS — M4302 Spondylolysis, cervical region: Secondary | ICD-10-CM | POA: Diagnosis not present

## 2023-09-27 DIAGNOSIS — F02818 Dementia in other diseases classified elsewhere, unspecified severity, with other behavioral disturbance: Secondary | ICD-10-CM | POA: Diagnosis not present

## 2023-09-27 DIAGNOSIS — R630 Anorexia: Secondary | ICD-10-CM | POA: Diagnosis not present

## 2023-09-27 DIAGNOSIS — K529 Noninfective gastroenteritis and colitis, unspecified: Secondary | ICD-10-CM | POA: Diagnosis not present

## 2023-09-27 DIAGNOSIS — E119 Type 2 diabetes mellitus without complications: Secondary | ICD-10-CM | POA: Diagnosis not present

## 2023-09-30 DIAGNOSIS — G311 Senile degeneration of brain, not elsewhere classified: Secondary | ICD-10-CM | POA: Diagnosis not present

## 2023-09-30 DIAGNOSIS — R630 Anorexia: Secondary | ICD-10-CM | POA: Diagnosis not present

## 2023-09-30 DIAGNOSIS — M4302 Spondylolysis, cervical region: Secondary | ICD-10-CM | POA: Diagnosis not present

## 2023-09-30 DIAGNOSIS — K529 Noninfective gastroenteritis and colitis, unspecified: Secondary | ICD-10-CM | POA: Diagnosis not present

## 2023-09-30 DIAGNOSIS — E119 Type 2 diabetes mellitus without complications: Secondary | ICD-10-CM | POA: Diagnosis not present

## 2023-09-30 DIAGNOSIS — F02818 Dementia in other diseases classified elsewhere, unspecified severity, with other behavioral disturbance: Secondary | ICD-10-CM | POA: Diagnosis not present

## 2023-10-04 DIAGNOSIS — G311 Senile degeneration of brain, not elsewhere classified: Secondary | ICD-10-CM | POA: Diagnosis not present

## 2023-10-04 DIAGNOSIS — E119 Type 2 diabetes mellitus without complications: Secondary | ICD-10-CM | POA: Diagnosis not present

## 2023-10-04 DIAGNOSIS — R630 Anorexia: Secondary | ICD-10-CM | POA: Diagnosis not present

## 2023-10-04 DIAGNOSIS — M4302 Spondylolysis, cervical region: Secondary | ICD-10-CM | POA: Diagnosis not present

## 2023-10-04 DIAGNOSIS — K529 Noninfective gastroenteritis and colitis, unspecified: Secondary | ICD-10-CM | POA: Diagnosis not present

## 2023-10-04 DIAGNOSIS — F02818 Dementia in other diseases classified elsewhere, unspecified severity, with other behavioral disturbance: Secondary | ICD-10-CM | POA: Diagnosis not present

## 2023-10-05 DIAGNOSIS — G311 Senile degeneration of brain, not elsewhere classified: Secondary | ICD-10-CM | POA: Diagnosis not present

## 2023-10-05 DIAGNOSIS — E119 Type 2 diabetes mellitus without complications: Secondary | ICD-10-CM | POA: Diagnosis not present

## 2023-10-05 DIAGNOSIS — F02818 Dementia in other diseases classified elsewhere, unspecified severity, with other behavioral disturbance: Secondary | ICD-10-CM | POA: Diagnosis not present

## 2023-10-05 DIAGNOSIS — M4302 Spondylolysis, cervical region: Secondary | ICD-10-CM | POA: Diagnosis not present

## 2023-10-05 DIAGNOSIS — R630 Anorexia: Secondary | ICD-10-CM | POA: Diagnosis not present

## 2023-10-05 DIAGNOSIS — K529 Noninfective gastroenteritis and colitis, unspecified: Secondary | ICD-10-CM | POA: Diagnosis not present

## 2023-10-07 DIAGNOSIS — E119 Type 2 diabetes mellitus without complications: Secondary | ICD-10-CM | POA: Diagnosis not present

## 2023-10-07 DIAGNOSIS — M4302 Spondylolysis, cervical region: Secondary | ICD-10-CM | POA: Diagnosis not present

## 2023-10-07 DIAGNOSIS — R630 Anorexia: Secondary | ICD-10-CM | POA: Diagnosis not present

## 2023-10-07 DIAGNOSIS — G311 Senile degeneration of brain, not elsewhere classified: Secondary | ICD-10-CM | POA: Diagnosis not present

## 2023-10-07 DIAGNOSIS — K529 Noninfective gastroenteritis and colitis, unspecified: Secondary | ICD-10-CM | POA: Diagnosis not present

## 2023-10-07 DIAGNOSIS — F02818 Dementia in other diseases classified elsewhere, unspecified severity, with other behavioral disturbance: Secondary | ICD-10-CM | POA: Diagnosis not present

## 2023-10-11 DIAGNOSIS — M4302 Spondylolysis, cervical region: Secondary | ICD-10-CM | POA: Diagnosis not present

## 2023-10-11 DIAGNOSIS — E119 Type 2 diabetes mellitus without complications: Secondary | ICD-10-CM | POA: Diagnosis not present

## 2023-10-11 DIAGNOSIS — K529 Noninfective gastroenteritis and colitis, unspecified: Secondary | ICD-10-CM | POA: Diagnosis not present

## 2023-10-11 DIAGNOSIS — R630 Anorexia: Secondary | ICD-10-CM | POA: Diagnosis not present

## 2023-10-11 DIAGNOSIS — G311 Senile degeneration of brain, not elsewhere classified: Secondary | ICD-10-CM | POA: Diagnosis not present

## 2023-10-11 DIAGNOSIS — F02818 Dementia in other diseases classified elsewhere, unspecified severity, with other behavioral disturbance: Secondary | ICD-10-CM | POA: Diagnosis not present

## 2023-10-14 DIAGNOSIS — R630 Anorexia: Secondary | ICD-10-CM | POA: Diagnosis not present

## 2023-10-14 DIAGNOSIS — G311 Senile degeneration of brain, not elsewhere classified: Secondary | ICD-10-CM | POA: Diagnosis not present

## 2023-10-14 DIAGNOSIS — E119 Type 2 diabetes mellitus without complications: Secondary | ICD-10-CM | POA: Diagnosis not present

## 2023-10-14 DIAGNOSIS — M4302 Spondylolysis, cervical region: Secondary | ICD-10-CM | POA: Diagnosis not present

## 2023-10-14 DIAGNOSIS — F02818 Dementia in other diseases classified elsewhere, unspecified severity, with other behavioral disturbance: Secondary | ICD-10-CM | POA: Diagnosis not present

## 2023-10-14 DIAGNOSIS — K529 Noninfective gastroenteritis and colitis, unspecified: Secondary | ICD-10-CM | POA: Diagnosis not present

## 2023-10-18 DIAGNOSIS — M4302 Spondylolysis, cervical region: Secondary | ICD-10-CM | POA: Diagnosis not present

## 2023-10-18 DIAGNOSIS — G311 Senile degeneration of brain, not elsewhere classified: Secondary | ICD-10-CM | POA: Diagnosis not present

## 2023-10-18 DIAGNOSIS — E039 Hypothyroidism, unspecified: Secondary | ICD-10-CM | POA: Diagnosis not present

## 2023-10-18 DIAGNOSIS — E785 Hyperlipidemia, unspecified: Secondary | ICD-10-CM | POA: Diagnosis not present

## 2023-10-18 DIAGNOSIS — M1990 Unspecified osteoarthritis, unspecified site: Secondary | ICD-10-CM | POA: Diagnosis not present

## 2023-10-18 DIAGNOSIS — R630 Anorexia: Secondary | ICD-10-CM | POA: Diagnosis not present

## 2023-10-18 DIAGNOSIS — I471 Supraventricular tachycardia, unspecified: Secondary | ICD-10-CM | POA: Diagnosis not present

## 2023-10-18 DIAGNOSIS — E119 Type 2 diabetes mellitus without complications: Secondary | ICD-10-CM | POA: Diagnosis not present

## 2023-10-18 DIAGNOSIS — I10 Essential (primary) hypertension: Secondary | ICD-10-CM | POA: Diagnosis not present

## 2023-10-18 DIAGNOSIS — F02818 Dementia in other diseases classified elsewhere, unspecified severity, with other behavioral disturbance: Secondary | ICD-10-CM | POA: Diagnosis not present

## 2023-10-18 DIAGNOSIS — K529 Noninfective gastroenteritis and colitis, unspecified: Secondary | ICD-10-CM | POA: Diagnosis not present

## 2023-10-20 DIAGNOSIS — R630 Anorexia: Secondary | ICD-10-CM | POA: Diagnosis not present

## 2023-10-20 DIAGNOSIS — E119 Type 2 diabetes mellitus without complications: Secondary | ICD-10-CM | POA: Diagnosis not present

## 2023-10-20 DIAGNOSIS — M4302 Spondylolysis, cervical region: Secondary | ICD-10-CM | POA: Diagnosis not present

## 2023-10-20 DIAGNOSIS — F02818 Dementia in other diseases classified elsewhere, unspecified severity, with other behavioral disturbance: Secondary | ICD-10-CM | POA: Diagnosis not present

## 2023-10-20 DIAGNOSIS — K529 Noninfective gastroenteritis and colitis, unspecified: Secondary | ICD-10-CM | POA: Diagnosis not present

## 2023-10-20 DIAGNOSIS — G311 Senile degeneration of brain, not elsewhere classified: Secondary | ICD-10-CM | POA: Diagnosis not present

## 2023-10-21 DIAGNOSIS — R630 Anorexia: Secondary | ICD-10-CM | POA: Diagnosis not present

## 2023-10-21 DIAGNOSIS — F02818 Dementia in other diseases classified elsewhere, unspecified severity, with other behavioral disturbance: Secondary | ICD-10-CM | POA: Diagnosis not present

## 2023-10-21 DIAGNOSIS — G311 Senile degeneration of brain, not elsewhere classified: Secondary | ICD-10-CM | POA: Diagnosis not present

## 2023-10-21 DIAGNOSIS — M4302 Spondylolysis, cervical region: Secondary | ICD-10-CM | POA: Diagnosis not present

## 2023-10-21 DIAGNOSIS — E119 Type 2 diabetes mellitus without complications: Secondary | ICD-10-CM | POA: Diagnosis not present

## 2023-10-21 DIAGNOSIS — K529 Noninfective gastroenteritis and colitis, unspecified: Secondary | ICD-10-CM | POA: Diagnosis not present

## 2023-10-25 DIAGNOSIS — R630 Anorexia: Secondary | ICD-10-CM | POA: Diagnosis not present

## 2023-10-25 DIAGNOSIS — M4302 Spondylolysis, cervical region: Secondary | ICD-10-CM | POA: Diagnosis not present

## 2023-10-25 DIAGNOSIS — G311 Senile degeneration of brain, not elsewhere classified: Secondary | ICD-10-CM | POA: Diagnosis not present

## 2023-10-25 DIAGNOSIS — E119 Type 2 diabetes mellitus without complications: Secondary | ICD-10-CM | POA: Diagnosis not present

## 2023-10-25 DIAGNOSIS — K529 Noninfective gastroenteritis and colitis, unspecified: Secondary | ICD-10-CM | POA: Diagnosis not present

## 2023-10-25 DIAGNOSIS — F02818 Dementia in other diseases classified elsewhere, unspecified severity, with other behavioral disturbance: Secondary | ICD-10-CM | POA: Diagnosis not present

## 2023-10-28 DIAGNOSIS — M4302 Spondylolysis, cervical region: Secondary | ICD-10-CM | POA: Diagnosis not present

## 2023-10-28 DIAGNOSIS — R630 Anorexia: Secondary | ICD-10-CM | POA: Diagnosis not present

## 2023-10-28 DIAGNOSIS — K529 Noninfective gastroenteritis and colitis, unspecified: Secondary | ICD-10-CM | POA: Diagnosis not present

## 2023-10-28 DIAGNOSIS — G311 Senile degeneration of brain, not elsewhere classified: Secondary | ICD-10-CM | POA: Diagnosis not present

## 2023-10-28 DIAGNOSIS — E119 Type 2 diabetes mellitus without complications: Secondary | ICD-10-CM | POA: Diagnosis not present

## 2023-10-28 DIAGNOSIS — F02818 Dementia in other diseases classified elsewhere, unspecified severity, with other behavioral disturbance: Secondary | ICD-10-CM | POA: Diagnosis not present

## 2023-11-01 DIAGNOSIS — E119 Type 2 diabetes mellitus without complications: Secondary | ICD-10-CM | POA: Diagnosis not present

## 2023-11-01 DIAGNOSIS — G311 Senile degeneration of brain, not elsewhere classified: Secondary | ICD-10-CM | POA: Diagnosis not present

## 2023-11-01 DIAGNOSIS — R630 Anorexia: Secondary | ICD-10-CM | POA: Diagnosis not present

## 2023-11-01 DIAGNOSIS — K529 Noninfective gastroenteritis and colitis, unspecified: Secondary | ICD-10-CM | POA: Diagnosis not present

## 2023-11-01 DIAGNOSIS — F02818 Dementia in other diseases classified elsewhere, unspecified severity, with other behavioral disturbance: Secondary | ICD-10-CM | POA: Diagnosis not present

## 2023-11-01 DIAGNOSIS — M4302 Spondylolysis, cervical region: Secondary | ICD-10-CM | POA: Diagnosis not present

## 2023-11-04 DIAGNOSIS — E119 Type 2 diabetes mellitus without complications: Secondary | ICD-10-CM | POA: Diagnosis not present

## 2023-11-04 DIAGNOSIS — F02818 Dementia in other diseases classified elsewhere, unspecified severity, with other behavioral disturbance: Secondary | ICD-10-CM | POA: Diagnosis not present

## 2023-11-04 DIAGNOSIS — K529 Noninfective gastroenteritis and colitis, unspecified: Secondary | ICD-10-CM | POA: Diagnosis not present

## 2023-11-04 DIAGNOSIS — G311 Senile degeneration of brain, not elsewhere classified: Secondary | ICD-10-CM | POA: Diagnosis not present

## 2023-11-04 DIAGNOSIS — R630 Anorexia: Secondary | ICD-10-CM | POA: Diagnosis not present

## 2023-11-04 DIAGNOSIS — M4302 Spondylolysis, cervical region: Secondary | ICD-10-CM | POA: Diagnosis not present

## 2023-11-08 DIAGNOSIS — M4302 Spondylolysis, cervical region: Secondary | ICD-10-CM | POA: Diagnosis not present

## 2023-11-08 DIAGNOSIS — K529 Noninfective gastroenteritis and colitis, unspecified: Secondary | ICD-10-CM | POA: Diagnosis not present

## 2023-11-08 DIAGNOSIS — E119 Type 2 diabetes mellitus without complications: Secondary | ICD-10-CM | POA: Diagnosis not present

## 2023-11-08 DIAGNOSIS — G311 Senile degeneration of brain, not elsewhere classified: Secondary | ICD-10-CM | POA: Diagnosis not present

## 2023-11-08 DIAGNOSIS — F02818 Dementia in other diseases classified elsewhere, unspecified severity, with other behavioral disturbance: Secondary | ICD-10-CM | POA: Diagnosis not present

## 2023-11-08 DIAGNOSIS — R630 Anorexia: Secondary | ICD-10-CM | POA: Diagnosis not present

## 2023-11-11 DIAGNOSIS — E119 Type 2 diabetes mellitus without complications: Secondary | ICD-10-CM | POA: Diagnosis not present

## 2023-11-11 DIAGNOSIS — G311 Senile degeneration of brain, not elsewhere classified: Secondary | ICD-10-CM | POA: Diagnosis not present

## 2023-11-11 DIAGNOSIS — R630 Anorexia: Secondary | ICD-10-CM | POA: Diagnosis not present

## 2023-11-11 DIAGNOSIS — F02818 Dementia in other diseases classified elsewhere, unspecified severity, with other behavioral disturbance: Secondary | ICD-10-CM | POA: Diagnosis not present

## 2023-11-11 DIAGNOSIS — K529 Noninfective gastroenteritis and colitis, unspecified: Secondary | ICD-10-CM | POA: Diagnosis not present

## 2023-11-11 DIAGNOSIS — M4302 Spondylolysis, cervical region: Secondary | ICD-10-CM | POA: Diagnosis not present

## 2023-11-15 DIAGNOSIS — R630 Anorexia: Secondary | ICD-10-CM | POA: Diagnosis not present

## 2023-11-15 DIAGNOSIS — M4302 Spondylolysis, cervical region: Secondary | ICD-10-CM | POA: Diagnosis not present

## 2023-11-15 DIAGNOSIS — K529 Noninfective gastroenteritis and colitis, unspecified: Secondary | ICD-10-CM | POA: Diagnosis not present

## 2023-11-15 DIAGNOSIS — G311 Senile degeneration of brain, not elsewhere classified: Secondary | ICD-10-CM | POA: Diagnosis not present

## 2023-11-15 DIAGNOSIS — E119 Type 2 diabetes mellitus without complications: Secondary | ICD-10-CM | POA: Diagnosis not present

## 2023-11-15 DIAGNOSIS — F02818 Dementia in other diseases classified elsewhere, unspecified severity, with other behavioral disturbance: Secondary | ICD-10-CM | POA: Diagnosis not present

## 2023-11-18 DIAGNOSIS — I471 Supraventricular tachycardia, unspecified: Secondary | ICD-10-CM | POA: Diagnosis not present

## 2023-11-18 DIAGNOSIS — M4302 Spondylolysis, cervical region: Secondary | ICD-10-CM | POA: Diagnosis not present

## 2023-11-18 DIAGNOSIS — E119 Type 2 diabetes mellitus without complications: Secondary | ICD-10-CM | POA: Diagnosis not present

## 2023-11-18 DIAGNOSIS — K529 Noninfective gastroenteritis and colitis, unspecified: Secondary | ICD-10-CM | POA: Diagnosis not present

## 2023-11-18 DIAGNOSIS — R630 Anorexia: Secondary | ICD-10-CM | POA: Diagnosis not present

## 2023-11-18 DIAGNOSIS — M1990 Unspecified osteoarthritis, unspecified site: Secondary | ICD-10-CM | POA: Diagnosis not present

## 2023-11-18 DIAGNOSIS — F02818 Dementia in other diseases classified elsewhere, unspecified severity, with other behavioral disturbance: Secondary | ICD-10-CM | POA: Diagnosis not present

## 2023-11-18 DIAGNOSIS — E785 Hyperlipidemia, unspecified: Secondary | ICD-10-CM | POA: Diagnosis not present

## 2023-11-18 DIAGNOSIS — E039 Hypothyroidism, unspecified: Secondary | ICD-10-CM | POA: Diagnosis not present

## 2023-11-18 DIAGNOSIS — I10 Essential (primary) hypertension: Secondary | ICD-10-CM | POA: Diagnosis not present

## 2023-11-18 DIAGNOSIS — G311 Senile degeneration of brain, not elsewhere classified: Secondary | ICD-10-CM | POA: Diagnosis not present

## 2023-11-22 DIAGNOSIS — K529 Noninfective gastroenteritis and colitis, unspecified: Secondary | ICD-10-CM | POA: Diagnosis not present

## 2023-11-22 DIAGNOSIS — G311 Senile degeneration of brain, not elsewhere classified: Secondary | ICD-10-CM | POA: Diagnosis not present

## 2023-11-22 DIAGNOSIS — E119 Type 2 diabetes mellitus without complications: Secondary | ICD-10-CM | POA: Diagnosis not present

## 2023-11-22 DIAGNOSIS — M4302 Spondylolysis, cervical region: Secondary | ICD-10-CM | POA: Diagnosis not present

## 2023-11-22 DIAGNOSIS — R630 Anorexia: Secondary | ICD-10-CM | POA: Diagnosis not present

## 2023-11-22 DIAGNOSIS — F02818 Dementia in other diseases classified elsewhere, unspecified severity, with other behavioral disturbance: Secondary | ICD-10-CM | POA: Diagnosis not present

## 2023-11-24 DIAGNOSIS — K529 Noninfective gastroenteritis and colitis, unspecified: Secondary | ICD-10-CM | POA: Diagnosis not present

## 2023-11-24 DIAGNOSIS — G311 Senile degeneration of brain, not elsewhere classified: Secondary | ICD-10-CM | POA: Diagnosis not present

## 2023-11-24 DIAGNOSIS — E119 Type 2 diabetes mellitus without complications: Secondary | ICD-10-CM | POA: Diagnosis not present

## 2023-11-24 DIAGNOSIS — R630 Anorexia: Secondary | ICD-10-CM | POA: Diagnosis not present

## 2023-11-24 DIAGNOSIS — F02818 Dementia in other diseases classified elsewhere, unspecified severity, with other behavioral disturbance: Secondary | ICD-10-CM | POA: Diagnosis not present

## 2023-11-24 DIAGNOSIS — M4302 Spondylolysis, cervical region: Secondary | ICD-10-CM | POA: Diagnosis not present

## 2023-11-25 DIAGNOSIS — F02818 Dementia in other diseases classified elsewhere, unspecified severity, with other behavioral disturbance: Secondary | ICD-10-CM | POA: Diagnosis not present

## 2023-11-25 DIAGNOSIS — M4302 Spondylolysis, cervical region: Secondary | ICD-10-CM | POA: Diagnosis not present

## 2023-11-25 DIAGNOSIS — E119 Type 2 diabetes mellitus without complications: Secondary | ICD-10-CM | POA: Diagnosis not present

## 2023-11-25 DIAGNOSIS — G311 Senile degeneration of brain, not elsewhere classified: Secondary | ICD-10-CM | POA: Diagnosis not present

## 2023-11-25 DIAGNOSIS — R630 Anorexia: Secondary | ICD-10-CM | POA: Diagnosis not present

## 2023-11-25 DIAGNOSIS — K529 Noninfective gastroenteritis and colitis, unspecified: Secondary | ICD-10-CM | POA: Diagnosis not present

## 2023-11-29 DIAGNOSIS — M4302 Spondylolysis, cervical region: Secondary | ICD-10-CM | POA: Diagnosis not present

## 2023-11-29 DIAGNOSIS — R630 Anorexia: Secondary | ICD-10-CM | POA: Diagnosis not present

## 2023-11-29 DIAGNOSIS — F02818 Dementia in other diseases classified elsewhere, unspecified severity, with other behavioral disturbance: Secondary | ICD-10-CM | POA: Diagnosis not present

## 2023-11-29 DIAGNOSIS — K529 Noninfective gastroenteritis and colitis, unspecified: Secondary | ICD-10-CM | POA: Diagnosis not present

## 2023-11-29 DIAGNOSIS — E119 Type 2 diabetes mellitus without complications: Secondary | ICD-10-CM | POA: Diagnosis not present

## 2023-11-29 DIAGNOSIS — G311 Senile degeneration of brain, not elsewhere classified: Secondary | ICD-10-CM | POA: Diagnosis not present

## 2023-12-02 DIAGNOSIS — E119 Type 2 diabetes mellitus without complications: Secondary | ICD-10-CM | POA: Diagnosis not present

## 2023-12-02 DIAGNOSIS — G311 Senile degeneration of brain, not elsewhere classified: Secondary | ICD-10-CM | POA: Diagnosis not present

## 2023-12-02 DIAGNOSIS — R630 Anorexia: Secondary | ICD-10-CM | POA: Diagnosis not present

## 2023-12-02 DIAGNOSIS — F02818 Dementia in other diseases classified elsewhere, unspecified severity, with other behavioral disturbance: Secondary | ICD-10-CM | POA: Diagnosis not present

## 2023-12-02 DIAGNOSIS — M4302 Spondylolysis, cervical region: Secondary | ICD-10-CM | POA: Diagnosis not present

## 2023-12-02 DIAGNOSIS — K529 Noninfective gastroenteritis and colitis, unspecified: Secondary | ICD-10-CM | POA: Diagnosis not present

## 2023-12-06 DIAGNOSIS — M4302 Spondylolysis, cervical region: Secondary | ICD-10-CM | POA: Diagnosis not present

## 2023-12-06 DIAGNOSIS — R630 Anorexia: Secondary | ICD-10-CM | POA: Diagnosis not present

## 2023-12-06 DIAGNOSIS — F02818 Dementia in other diseases classified elsewhere, unspecified severity, with other behavioral disturbance: Secondary | ICD-10-CM | POA: Diagnosis not present

## 2023-12-06 DIAGNOSIS — G311 Senile degeneration of brain, not elsewhere classified: Secondary | ICD-10-CM | POA: Diagnosis not present

## 2023-12-06 DIAGNOSIS — E119 Type 2 diabetes mellitus without complications: Secondary | ICD-10-CM | POA: Diagnosis not present

## 2023-12-06 DIAGNOSIS — K529 Noninfective gastroenteritis and colitis, unspecified: Secondary | ICD-10-CM | POA: Diagnosis not present

## 2023-12-09 DIAGNOSIS — G311 Senile degeneration of brain, not elsewhere classified: Secondary | ICD-10-CM | POA: Diagnosis not present

## 2023-12-09 DIAGNOSIS — K529 Noninfective gastroenteritis and colitis, unspecified: Secondary | ICD-10-CM | POA: Diagnosis not present

## 2023-12-09 DIAGNOSIS — F02818 Dementia in other diseases classified elsewhere, unspecified severity, with other behavioral disturbance: Secondary | ICD-10-CM | POA: Diagnosis not present

## 2023-12-09 DIAGNOSIS — E119 Type 2 diabetes mellitus without complications: Secondary | ICD-10-CM | POA: Diagnosis not present

## 2023-12-09 DIAGNOSIS — M4302 Spondylolysis, cervical region: Secondary | ICD-10-CM | POA: Diagnosis not present

## 2023-12-09 DIAGNOSIS — R630 Anorexia: Secondary | ICD-10-CM | POA: Diagnosis not present

## 2023-12-13 DIAGNOSIS — M4302 Spondylolysis, cervical region: Secondary | ICD-10-CM | POA: Diagnosis not present

## 2023-12-13 DIAGNOSIS — E119 Type 2 diabetes mellitus without complications: Secondary | ICD-10-CM | POA: Diagnosis not present

## 2023-12-13 DIAGNOSIS — R630 Anorexia: Secondary | ICD-10-CM | POA: Diagnosis not present

## 2023-12-13 DIAGNOSIS — F02818 Dementia in other diseases classified elsewhere, unspecified severity, with other behavioral disturbance: Secondary | ICD-10-CM | POA: Diagnosis not present

## 2023-12-13 DIAGNOSIS — G311 Senile degeneration of brain, not elsewhere classified: Secondary | ICD-10-CM | POA: Diagnosis not present

## 2023-12-13 DIAGNOSIS — K529 Noninfective gastroenteritis and colitis, unspecified: Secondary | ICD-10-CM | POA: Diagnosis not present

## 2023-12-16 DIAGNOSIS — F02818 Dementia in other diseases classified elsewhere, unspecified severity, with other behavioral disturbance: Secondary | ICD-10-CM | POA: Diagnosis not present

## 2023-12-16 DIAGNOSIS — G311 Senile degeneration of brain, not elsewhere classified: Secondary | ICD-10-CM | POA: Diagnosis not present

## 2023-12-16 DIAGNOSIS — K529 Noninfective gastroenteritis and colitis, unspecified: Secondary | ICD-10-CM | POA: Diagnosis not present

## 2023-12-16 DIAGNOSIS — R630 Anorexia: Secondary | ICD-10-CM | POA: Diagnosis not present

## 2023-12-16 DIAGNOSIS — M4302 Spondylolysis, cervical region: Secondary | ICD-10-CM | POA: Diagnosis not present

## 2023-12-16 DIAGNOSIS — E119 Type 2 diabetes mellitus without complications: Secondary | ICD-10-CM | POA: Diagnosis not present

## 2023-12-19 DIAGNOSIS — I10 Essential (primary) hypertension: Secondary | ICD-10-CM | POA: Diagnosis not present

## 2023-12-19 DIAGNOSIS — E119 Type 2 diabetes mellitus without complications: Secondary | ICD-10-CM | POA: Diagnosis not present

## 2023-12-19 DIAGNOSIS — I471 Supraventricular tachycardia, unspecified: Secondary | ICD-10-CM | POA: Diagnosis not present

## 2023-12-19 DIAGNOSIS — F02818 Dementia in other diseases classified elsewhere, unspecified severity, with other behavioral disturbance: Secondary | ICD-10-CM | POA: Diagnosis not present

## 2023-12-19 DIAGNOSIS — E785 Hyperlipidemia, unspecified: Secondary | ICD-10-CM | POA: Diagnosis not present

## 2023-12-19 DIAGNOSIS — M4302 Spondylolysis, cervical region: Secondary | ICD-10-CM | POA: Diagnosis not present

## 2023-12-19 DIAGNOSIS — E039 Hypothyroidism, unspecified: Secondary | ICD-10-CM | POA: Diagnosis not present

## 2023-12-19 DIAGNOSIS — G311 Senile degeneration of brain, not elsewhere classified: Secondary | ICD-10-CM | POA: Diagnosis not present

## 2023-12-19 DIAGNOSIS — K529 Noninfective gastroenteritis and colitis, unspecified: Secondary | ICD-10-CM | POA: Diagnosis not present

## 2023-12-19 DIAGNOSIS — M1990 Unspecified osteoarthritis, unspecified site: Secondary | ICD-10-CM | POA: Diagnosis not present

## 2023-12-19 DIAGNOSIS — R630 Anorexia: Secondary | ICD-10-CM | POA: Diagnosis not present

## 2023-12-20 DIAGNOSIS — G311 Senile degeneration of brain, not elsewhere classified: Secondary | ICD-10-CM | POA: Diagnosis not present

## 2023-12-20 DIAGNOSIS — K529 Noninfective gastroenteritis and colitis, unspecified: Secondary | ICD-10-CM | POA: Diagnosis not present

## 2023-12-20 DIAGNOSIS — F02818 Dementia in other diseases classified elsewhere, unspecified severity, with other behavioral disturbance: Secondary | ICD-10-CM | POA: Diagnosis not present

## 2023-12-20 DIAGNOSIS — R630 Anorexia: Secondary | ICD-10-CM | POA: Diagnosis not present

## 2023-12-20 DIAGNOSIS — M4302 Spondylolysis, cervical region: Secondary | ICD-10-CM | POA: Diagnosis not present

## 2023-12-20 DIAGNOSIS — E119 Type 2 diabetes mellitus without complications: Secondary | ICD-10-CM | POA: Diagnosis not present

## 2023-12-23 DIAGNOSIS — R630 Anorexia: Secondary | ICD-10-CM | POA: Diagnosis not present

## 2023-12-23 DIAGNOSIS — M4302 Spondylolysis, cervical region: Secondary | ICD-10-CM | POA: Diagnosis not present

## 2023-12-23 DIAGNOSIS — G311 Senile degeneration of brain, not elsewhere classified: Secondary | ICD-10-CM | POA: Diagnosis not present

## 2023-12-23 DIAGNOSIS — F02818 Dementia in other diseases classified elsewhere, unspecified severity, with other behavioral disturbance: Secondary | ICD-10-CM | POA: Diagnosis not present

## 2023-12-23 DIAGNOSIS — E119 Type 2 diabetes mellitus without complications: Secondary | ICD-10-CM | POA: Diagnosis not present

## 2023-12-23 DIAGNOSIS — K529 Noninfective gastroenteritis and colitis, unspecified: Secondary | ICD-10-CM | POA: Diagnosis not present

## 2023-12-27 DIAGNOSIS — R630 Anorexia: Secondary | ICD-10-CM | POA: Diagnosis not present

## 2023-12-27 DIAGNOSIS — M4302 Spondylolysis, cervical region: Secondary | ICD-10-CM | POA: Diagnosis not present

## 2023-12-27 DIAGNOSIS — G311 Senile degeneration of brain, not elsewhere classified: Secondary | ICD-10-CM | POA: Diagnosis not present

## 2023-12-27 DIAGNOSIS — K529 Noninfective gastroenteritis and colitis, unspecified: Secondary | ICD-10-CM | POA: Diagnosis not present

## 2023-12-27 DIAGNOSIS — F02818 Dementia in other diseases classified elsewhere, unspecified severity, with other behavioral disturbance: Secondary | ICD-10-CM | POA: Diagnosis not present

## 2023-12-27 DIAGNOSIS — E119 Type 2 diabetes mellitus without complications: Secondary | ICD-10-CM | POA: Diagnosis not present

## 2023-12-30 DIAGNOSIS — K529 Noninfective gastroenteritis and colitis, unspecified: Secondary | ICD-10-CM | POA: Diagnosis not present

## 2023-12-30 DIAGNOSIS — R630 Anorexia: Secondary | ICD-10-CM | POA: Diagnosis not present

## 2023-12-30 DIAGNOSIS — M4302 Spondylolysis, cervical region: Secondary | ICD-10-CM | POA: Diagnosis not present

## 2023-12-30 DIAGNOSIS — E119 Type 2 diabetes mellitus without complications: Secondary | ICD-10-CM | POA: Diagnosis not present

## 2023-12-30 DIAGNOSIS — F02818 Dementia in other diseases classified elsewhere, unspecified severity, with other behavioral disturbance: Secondary | ICD-10-CM | POA: Diagnosis not present

## 2023-12-30 DIAGNOSIS — G311 Senile degeneration of brain, not elsewhere classified: Secondary | ICD-10-CM | POA: Diagnosis not present

## 2024-01-03 DIAGNOSIS — F02818 Dementia in other diseases classified elsewhere, unspecified severity, with other behavioral disturbance: Secondary | ICD-10-CM | POA: Diagnosis not present

## 2024-01-03 DIAGNOSIS — M4302 Spondylolysis, cervical region: Secondary | ICD-10-CM | POA: Diagnosis not present

## 2024-01-03 DIAGNOSIS — G311 Senile degeneration of brain, not elsewhere classified: Secondary | ICD-10-CM | POA: Diagnosis not present

## 2024-01-03 DIAGNOSIS — R630 Anorexia: Secondary | ICD-10-CM | POA: Diagnosis not present

## 2024-01-03 DIAGNOSIS — E119 Type 2 diabetes mellitus without complications: Secondary | ICD-10-CM | POA: Diagnosis not present

## 2024-01-03 DIAGNOSIS — K529 Noninfective gastroenteritis and colitis, unspecified: Secondary | ICD-10-CM | POA: Diagnosis not present

## 2024-01-06 DIAGNOSIS — K529 Noninfective gastroenteritis and colitis, unspecified: Secondary | ICD-10-CM | POA: Diagnosis not present

## 2024-01-06 DIAGNOSIS — M4302 Spondylolysis, cervical region: Secondary | ICD-10-CM | POA: Diagnosis not present

## 2024-01-06 DIAGNOSIS — R630 Anorexia: Secondary | ICD-10-CM | POA: Diagnosis not present

## 2024-01-06 DIAGNOSIS — F02818 Dementia in other diseases classified elsewhere, unspecified severity, with other behavioral disturbance: Secondary | ICD-10-CM | POA: Diagnosis not present

## 2024-01-06 DIAGNOSIS — G311 Senile degeneration of brain, not elsewhere classified: Secondary | ICD-10-CM | POA: Diagnosis not present

## 2024-01-06 DIAGNOSIS — E119 Type 2 diabetes mellitus without complications: Secondary | ICD-10-CM | POA: Diagnosis not present

## 2024-01-10 DIAGNOSIS — R630 Anorexia: Secondary | ICD-10-CM | POA: Diagnosis not present

## 2024-01-10 DIAGNOSIS — E119 Type 2 diabetes mellitus without complications: Secondary | ICD-10-CM | POA: Diagnosis not present

## 2024-01-10 DIAGNOSIS — G311 Senile degeneration of brain, not elsewhere classified: Secondary | ICD-10-CM | POA: Diagnosis not present

## 2024-01-10 DIAGNOSIS — K529 Noninfective gastroenteritis and colitis, unspecified: Secondary | ICD-10-CM | POA: Diagnosis not present

## 2024-01-10 DIAGNOSIS — F02818 Dementia in other diseases classified elsewhere, unspecified severity, with other behavioral disturbance: Secondary | ICD-10-CM | POA: Diagnosis not present

## 2024-01-10 DIAGNOSIS — M4302 Spondylolysis, cervical region: Secondary | ICD-10-CM | POA: Diagnosis not present

## 2024-01-13 DIAGNOSIS — K529 Noninfective gastroenteritis and colitis, unspecified: Secondary | ICD-10-CM | POA: Diagnosis not present

## 2024-01-13 DIAGNOSIS — R630 Anorexia: Secondary | ICD-10-CM | POA: Diagnosis not present

## 2024-01-13 DIAGNOSIS — M4302 Spondylolysis, cervical region: Secondary | ICD-10-CM | POA: Diagnosis not present

## 2024-01-13 DIAGNOSIS — G311 Senile degeneration of brain, not elsewhere classified: Secondary | ICD-10-CM | POA: Diagnosis not present

## 2024-01-13 DIAGNOSIS — E119 Type 2 diabetes mellitus without complications: Secondary | ICD-10-CM | POA: Diagnosis not present

## 2024-01-13 DIAGNOSIS — F02818 Dementia in other diseases classified elsewhere, unspecified severity, with other behavioral disturbance: Secondary | ICD-10-CM | POA: Diagnosis not present

## 2024-01-17 DIAGNOSIS — K529 Noninfective gastroenteritis and colitis, unspecified: Secondary | ICD-10-CM | POA: Diagnosis not present

## 2024-01-17 DIAGNOSIS — E119 Type 2 diabetes mellitus without complications: Secondary | ICD-10-CM | POA: Diagnosis not present

## 2024-01-17 DIAGNOSIS — F02818 Dementia in other diseases classified elsewhere, unspecified severity, with other behavioral disturbance: Secondary | ICD-10-CM | POA: Diagnosis not present

## 2024-01-17 DIAGNOSIS — M4302 Spondylolysis, cervical region: Secondary | ICD-10-CM | POA: Diagnosis not present

## 2024-01-17 DIAGNOSIS — R630 Anorexia: Secondary | ICD-10-CM | POA: Diagnosis not present

## 2024-01-17 DIAGNOSIS — G311 Senile degeneration of brain, not elsewhere classified: Secondary | ICD-10-CM | POA: Diagnosis not present

## 2024-01-18 DIAGNOSIS — R630 Anorexia: Secondary | ICD-10-CM | POA: Diagnosis not present

## 2024-01-18 DIAGNOSIS — K529 Noninfective gastroenteritis and colitis, unspecified: Secondary | ICD-10-CM | POA: Diagnosis not present

## 2024-01-18 DIAGNOSIS — E785 Hyperlipidemia, unspecified: Secondary | ICD-10-CM | POA: Diagnosis not present

## 2024-01-18 DIAGNOSIS — G311 Senile degeneration of brain, not elsewhere classified: Secondary | ICD-10-CM | POA: Diagnosis not present

## 2024-01-18 DIAGNOSIS — I471 Supraventricular tachycardia, unspecified: Secondary | ICD-10-CM | POA: Diagnosis not present

## 2024-01-18 DIAGNOSIS — I10 Essential (primary) hypertension: Secondary | ICD-10-CM | POA: Diagnosis not present

## 2024-01-18 DIAGNOSIS — F02818 Dementia in other diseases classified elsewhere, unspecified severity, with other behavioral disturbance: Secondary | ICD-10-CM | POA: Diagnosis not present

## 2024-01-18 DIAGNOSIS — M4302 Spondylolysis, cervical region: Secondary | ICD-10-CM | POA: Diagnosis not present

## 2024-01-18 DIAGNOSIS — M1990 Unspecified osteoarthritis, unspecified site: Secondary | ICD-10-CM | POA: Diagnosis not present

## 2024-01-18 DIAGNOSIS — E119 Type 2 diabetes mellitus without complications: Secondary | ICD-10-CM | POA: Diagnosis not present

## 2024-01-18 DIAGNOSIS — E039 Hypothyroidism, unspecified: Secondary | ICD-10-CM | POA: Diagnosis not present

## 2024-01-20 DIAGNOSIS — R630 Anorexia: Secondary | ICD-10-CM | POA: Diagnosis not present

## 2024-01-20 DIAGNOSIS — F02818 Dementia in other diseases classified elsewhere, unspecified severity, with other behavioral disturbance: Secondary | ICD-10-CM | POA: Diagnosis not present

## 2024-01-20 DIAGNOSIS — K529 Noninfective gastroenteritis and colitis, unspecified: Secondary | ICD-10-CM | POA: Diagnosis not present

## 2024-01-20 DIAGNOSIS — G311 Senile degeneration of brain, not elsewhere classified: Secondary | ICD-10-CM | POA: Diagnosis not present

## 2024-01-20 DIAGNOSIS — E119 Type 2 diabetes mellitus without complications: Secondary | ICD-10-CM | POA: Diagnosis not present

## 2024-01-20 DIAGNOSIS — M4302 Spondylolysis, cervical region: Secondary | ICD-10-CM | POA: Diagnosis not present

## 2024-01-24 DIAGNOSIS — K529 Noninfective gastroenteritis and colitis, unspecified: Secondary | ICD-10-CM | POA: Diagnosis not present

## 2024-01-24 DIAGNOSIS — E119 Type 2 diabetes mellitus without complications: Secondary | ICD-10-CM | POA: Diagnosis not present

## 2024-01-24 DIAGNOSIS — R630 Anorexia: Secondary | ICD-10-CM | POA: Diagnosis not present

## 2024-01-24 DIAGNOSIS — G311 Senile degeneration of brain, not elsewhere classified: Secondary | ICD-10-CM | POA: Diagnosis not present

## 2024-01-24 DIAGNOSIS — F02818 Dementia in other diseases classified elsewhere, unspecified severity, with other behavioral disturbance: Secondary | ICD-10-CM | POA: Diagnosis not present

## 2024-01-24 DIAGNOSIS — M4302 Spondylolysis, cervical region: Secondary | ICD-10-CM | POA: Diagnosis not present

## 2024-01-27 DIAGNOSIS — K529 Noninfective gastroenteritis and colitis, unspecified: Secondary | ICD-10-CM | POA: Diagnosis not present

## 2024-01-27 DIAGNOSIS — E119 Type 2 diabetes mellitus without complications: Secondary | ICD-10-CM | POA: Diagnosis not present

## 2024-01-27 DIAGNOSIS — G311 Senile degeneration of brain, not elsewhere classified: Secondary | ICD-10-CM | POA: Diagnosis not present

## 2024-01-27 DIAGNOSIS — F02818 Dementia in other diseases classified elsewhere, unspecified severity, with other behavioral disturbance: Secondary | ICD-10-CM | POA: Diagnosis not present

## 2024-01-27 DIAGNOSIS — M4302 Spondylolysis, cervical region: Secondary | ICD-10-CM | POA: Diagnosis not present

## 2024-01-27 DIAGNOSIS — R630 Anorexia: Secondary | ICD-10-CM | POA: Diagnosis not present

## 2024-01-31 DIAGNOSIS — F02818 Dementia in other diseases classified elsewhere, unspecified severity, with other behavioral disturbance: Secondary | ICD-10-CM | POA: Diagnosis not present

## 2024-01-31 DIAGNOSIS — K529 Noninfective gastroenteritis and colitis, unspecified: Secondary | ICD-10-CM | POA: Diagnosis not present

## 2024-01-31 DIAGNOSIS — E119 Type 2 diabetes mellitus without complications: Secondary | ICD-10-CM | POA: Diagnosis not present

## 2024-01-31 DIAGNOSIS — G311 Senile degeneration of brain, not elsewhere classified: Secondary | ICD-10-CM | POA: Diagnosis not present

## 2024-01-31 DIAGNOSIS — M4302 Spondylolysis, cervical region: Secondary | ICD-10-CM | POA: Diagnosis not present

## 2024-01-31 DIAGNOSIS — R630 Anorexia: Secondary | ICD-10-CM | POA: Diagnosis not present

## 2024-02-03 DIAGNOSIS — E119 Type 2 diabetes mellitus without complications: Secondary | ICD-10-CM | POA: Diagnosis not present

## 2024-02-03 DIAGNOSIS — R630 Anorexia: Secondary | ICD-10-CM | POA: Diagnosis not present

## 2024-02-03 DIAGNOSIS — F02818 Dementia in other diseases classified elsewhere, unspecified severity, with other behavioral disturbance: Secondary | ICD-10-CM | POA: Diagnosis not present

## 2024-02-03 DIAGNOSIS — M4302 Spondylolysis, cervical region: Secondary | ICD-10-CM | POA: Diagnosis not present

## 2024-02-03 DIAGNOSIS — G311 Senile degeneration of brain, not elsewhere classified: Secondary | ICD-10-CM | POA: Diagnosis not present

## 2024-02-03 DIAGNOSIS — K529 Noninfective gastroenteritis and colitis, unspecified: Secondary | ICD-10-CM | POA: Diagnosis not present

## 2024-02-07 DIAGNOSIS — G311 Senile degeneration of brain, not elsewhere classified: Secondary | ICD-10-CM | POA: Diagnosis not present

## 2024-02-07 DIAGNOSIS — K529 Noninfective gastroenteritis and colitis, unspecified: Secondary | ICD-10-CM | POA: Diagnosis not present

## 2024-02-07 DIAGNOSIS — E119 Type 2 diabetes mellitus without complications: Secondary | ICD-10-CM | POA: Diagnosis not present

## 2024-02-07 DIAGNOSIS — F02818 Dementia in other diseases classified elsewhere, unspecified severity, with other behavioral disturbance: Secondary | ICD-10-CM | POA: Diagnosis not present

## 2024-02-07 DIAGNOSIS — M4302 Spondylolysis, cervical region: Secondary | ICD-10-CM | POA: Diagnosis not present

## 2024-02-07 DIAGNOSIS — R630 Anorexia: Secondary | ICD-10-CM | POA: Diagnosis not present

## 2024-02-10 DIAGNOSIS — M4302 Spondylolysis, cervical region: Secondary | ICD-10-CM | POA: Diagnosis not present

## 2024-02-10 DIAGNOSIS — F02818 Dementia in other diseases classified elsewhere, unspecified severity, with other behavioral disturbance: Secondary | ICD-10-CM | POA: Diagnosis not present

## 2024-02-10 DIAGNOSIS — K529 Noninfective gastroenteritis and colitis, unspecified: Secondary | ICD-10-CM | POA: Diagnosis not present

## 2024-02-10 DIAGNOSIS — R630 Anorexia: Secondary | ICD-10-CM | POA: Diagnosis not present

## 2024-02-10 DIAGNOSIS — G311 Senile degeneration of brain, not elsewhere classified: Secondary | ICD-10-CM | POA: Diagnosis not present

## 2024-02-10 DIAGNOSIS — E119 Type 2 diabetes mellitus without complications: Secondary | ICD-10-CM | POA: Diagnosis not present

## 2024-02-14 DIAGNOSIS — M4302 Spondylolysis, cervical region: Secondary | ICD-10-CM | POA: Diagnosis not present

## 2024-02-14 DIAGNOSIS — K529 Noninfective gastroenteritis and colitis, unspecified: Secondary | ICD-10-CM | POA: Diagnosis not present

## 2024-02-14 DIAGNOSIS — F02818 Dementia in other diseases classified elsewhere, unspecified severity, with other behavioral disturbance: Secondary | ICD-10-CM | POA: Diagnosis not present

## 2024-02-14 DIAGNOSIS — R630 Anorexia: Secondary | ICD-10-CM | POA: Diagnosis not present

## 2024-02-14 DIAGNOSIS — G311 Senile degeneration of brain, not elsewhere classified: Secondary | ICD-10-CM | POA: Diagnosis not present

## 2024-02-14 DIAGNOSIS — E119 Type 2 diabetes mellitus without complications: Secondary | ICD-10-CM | POA: Diagnosis not present

## 2024-02-17 DIAGNOSIS — R630 Anorexia: Secondary | ICD-10-CM | POA: Diagnosis not present

## 2024-02-17 DIAGNOSIS — K529 Noninfective gastroenteritis and colitis, unspecified: Secondary | ICD-10-CM | POA: Diagnosis not present

## 2024-02-17 DIAGNOSIS — M4302 Spondylolysis, cervical region: Secondary | ICD-10-CM | POA: Diagnosis not present

## 2024-02-17 DIAGNOSIS — G311 Senile degeneration of brain, not elsewhere classified: Secondary | ICD-10-CM | POA: Diagnosis not present

## 2024-02-17 DIAGNOSIS — F02818 Dementia in other diseases classified elsewhere, unspecified severity, with other behavioral disturbance: Secondary | ICD-10-CM | POA: Diagnosis not present

## 2024-02-17 DIAGNOSIS — E119 Type 2 diabetes mellitus without complications: Secondary | ICD-10-CM | POA: Diagnosis not present

## 2024-02-18 DIAGNOSIS — K529 Noninfective gastroenteritis and colitis, unspecified: Secondary | ICD-10-CM | POA: Diagnosis not present

## 2024-02-18 DIAGNOSIS — I10 Essential (primary) hypertension: Secondary | ICD-10-CM | POA: Diagnosis not present

## 2024-02-18 DIAGNOSIS — I471 Supraventricular tachycardia, unspecified: Secondary | ICD-10-CM | POA: Diagnosis not present

## 2024-02-18 DIAGNOSIS — E039 Hypothyroidism, unspecified: Secondary | ICD-10-CM | POA: Diagnosis not present

## 2024-02-18 DIAGNOSIS — M1990 Unspecified osteoarthritis, unspecified site: Secondary | ICD-10-CM | POA: Diagnosis not present

## 2024-02-18 DIAGNOSIS — F02818 Dementia in other diseases classified elsewhere, unspecified severity, with other behavioral disturbance: Secondary | ICD-10-CM | POA: Diagnosis not present

## 2024-02-18 DIAGNOSIS — G311 Senile degeneration of brain, not elsewhere classified: Secondary | ICD-10-CM | POA: Diagnosis not present

## 2024-02-18 DIAGNOSIS — E119 Type 2 diabetes mellitus without complications: Secondary | ICD-10-CM | POA: Diagnosis not present

## 2024-02-18 DIAGNOSIS — R630 Anorexia: Secondary | ICD-10-CM | POA: Diagnosis not present

## 2024-02-18 DIAGNOSIS — E785 Hyperlipidemia, unspecified: Secondary | ICD-10-CM | POA: Diagnosis not present

## 2024-02-18 DIAGNOSIS — M4302 Spondylolysis, cervical region: Secondary | ICD-10-CM | POA: Diagnosis not present

## 2024-02-21 DIAGNOSIS — F02818 Dementia in other diseases classified elsewhere, unspecified severity, with other behavioral disturbance: Secondary | ICD-10-CM | POA: Diagnosis not present

## 2024-02-21 DIAGNOSIS — E119 Type 2 diabetes mellitus without complications: Secondary | ICD-10-CM | POA: Diagnosis not present

## 2024-02-21 DIAGNOSIS — R630 Anorexia: Secondary | ICD-10-CM | POA: Diagnosis not present

## 2024-02-21 DIAGNOSIS — K529 Noninfective gastroenteritis and colitis, unspecified: Secondary | ICD-10-CM | POA: Diagnosis not present

## 2024-02-21 DIAGNOSIS — M4302 Spondylolysis, cervical region: Secondary | ICD-10-CM | POA: Diagnosis not present

## 2024-02-21 DIAGNOSIS — G311 Senile degeneration of brain, not elsewhere classified: Secondary | ICD-10-CM | POA: Diagnosis not present

## 2024-02-24 DIAGNOSIS — K529 Noninfective gastroenteritis and colitis, unspecified: Secondary | ICD-10-CM | POA: Diagnosis not present

## 2024-02-24 DIAGNOSIS — E119 Type 2 diabetes mellitus without complications: Secondary | ICD-10-CM | POA: Diagnosis not present

## 2024-02-24 DIAGNOSIS — R630 Anorexia: Secondary | ICD-10-CM | POA: Diagnosis not present

## 2024-02-24 DIAGNOSIS — G311 Senile degeneration of brain, not elsewhere classified: Secondary | ICD-10-CM | POA: Diagnosis not present

## 2024-02-24 DIAGNOSIS — F02818 Dementia in other diseases classified elsewhere, unspecified severity, with other behavioral disturbance: Secondary | ICD-10-CM | POA: Diagnosis not present

## 2024-02-24 DIAGNOSIS — M4302 Spondylolysis, cervical region: Secondary | ICD-10-CM | POA: Diagnosis not present

## 2024-02-28 DIAGNOSIS — G311 Senile degeneration of brain, not elsewhere classified: Secondary | ICD-10-CM | POA: Diagnosis not present

## 2024-02-28 DIAGNOSIS — F02818 Dementia in other diseases classified elsewhere, unspecified severity, with other behavioral disturbance: Secondary | ICD-10-CM | POA: Diagnosis not present

## 2024-02-28 DIAGNOSIS — M4302 Spondylolysis, cervical region: Secondary | ICD-10-CM | POA: Diagnosis not present

## 2024-02-28 DIAGNOSIS — E119 Type 2 diabetes mellitus without complications: Secondary | ICD-10-CM | POA: Diagnosis not present

## 2024-02-28 DIAGNOSIS — K529 Noninfective gastroenteritis and colitis, unspecified: Secondary | ICD-10-CM | POA: Diagnosis not present

## 2024-02-28 DIAGNOSIS — R630 Anorexia: Secondary | ICD-10-CM | POA: Diagnosis not present

## 2024-03-02 DIAGNOSIS — F02818 Dementia in other diseases classified elsewhere, unspecified severity, with other behavioral disturbance: Secondary | ICD-10-CM | POA: Diagnosis not present

## 2024-03-02 DIAGNOSIS — M4302 Spondylolysis, cervical region: Secondary | ICD-10-CM | POA: Diagnosis not present

## 2024-03-02 DIAGNOSIS — E119 Type 2 diabetes mellitus without complications: Secondary | ICD-10-CM | POA: Diagnosis not present

## 2024-03-02 DIAGNOSIS — K529 Noninfective gastroenteritis and colitis, unspecified: Secondary | ICD-10-CM | POA: Diagnosis not present

## 2024-03-02 DIAGNOSIS — G311 Senile degeneration of brain, not elsewhere classified: Secondary | ICD-10-CM | POA: Diagnosis not present

## 2024-03-02 DIAGNOSIS — R630 Anorexia: Secondary | ICD-10-CM | POA: Diagnosis not present

## 2024-03-06 DIAGNOSIS — G311 Senile degeneration of brain, not elsewhere classified: Secondary | ICD-10-CM | POA: Diagnosis not present

## 2024-03-06 DIAGNOSIS — M4302 Spondylolysis, cervical region: Secondary | ICD-10-CM | POA: Diagnosis not present

## 2024-03-06 DIAGNOSIS — R630 Anorexia: Secondary | ICD-10-CM | POA: Diagnosis not present

## 2024-03-06 DIAGNOSIS — E119 Type 2 diabetes mellitus without complications: Secondary | ICD-10-CM | POA: Diagnosis not present

## 2024-03-06 DIAGNOSIS — F02818 Dementia in other diseases classified elsewhere, unspecified severity, with other behavioral disturbance: Secondary | ICD-10-CM | POA: Diagnosis not present

## 2024-03-06 DIAGNOSIS — K529 Noninfective gastroenteritis and colitis, unspecified: Secondary | ICD-10-CM | POA: Diagnosis not present

## 2024-03-07 DIAGNOSIS — G311 Senile degeneration of brain, not elsewhere classified: Secondary | ICD-10-CM | POA: Diagnosis not present

## 2024-03-07 DIAGNOSIS — E119 Type 2 diabetes mellitus without complications: Secondary | ICD-10-CM | POA: Diagnosis not present

## 2024-03-07 DIAGNOSIS — R630 Anorexia: Secondary | ICD-10-CM | POA: Diagnosis not present

## 2024-03-07 DIAGNOSIS — K529 Noninfective gastroenteritis and colitis, unspecified: Secondary | ICD-10-CM | POA: Diagnosis not present

## 2024-03-07 DIAGNOSIS — M4302 Spondylolysis, cervical region: Secondary | ICD-10-CM | POA: Diagnosis not present

## 2024-03-07 DIAGNOSIS — F02818 Dementia in other diseases classified elsewhere, unspecified severity, with other behavioral disturbance: Secondary | ICD-10-CM | POA: Diagnosis not present

## 2024-03-09 DIAGNOSIS — M4302 Spondylolysis, cervical region: Secondary | ICD-10-CM | POA: Diagnosis not present

## 2024-03-09 DIAGNOSIS — R630 Anorexia: Secondary | ICD-10-CM | POA: Diagnosis not present

## 2024-03-09 DIAGNOSIS — K529 Noninfective gastroenteritis and colitis, unspecified: Secondary | ICD-10-CM | POA: Diagnosis not present

## 2024-03-09 DIAGNOSIS — E119 Type 2 diabetes mellitus without complications: Secondary | ICD-10-CM | POA: Diagnosis not present

## 2024-03-09 DIAGNOSIS — F02818 Dementia in other diseases classified elsewhere, unspecified severity, with other behavioral disturbance: Secondary | ICD-10-CM | POA: Diagnosis not present

## 2024-03-09 DIAGNOSIS — G311 Senile degeneration of brain, not elsewhere classified: Secondary | ICD-10-CM | POA: Diagnosis not present

## 2024-03-13 DIAGNOSIS — F02818 Dementia in other diseases classified elsewhere, unspecified severity, with other behavioral disturbance: Secondary | ICD-10-CM | POA: Diagnosis not present

## 2024-03-13 DIAGNOSIS — R630 Anorexia: Secondary | ICD-10-CM | POA: Diagnosis not present

## 2024-03-13 DIAGNOSIS — M4302 Spondylolysis, cervical region: Secondary | ICD-10-CM | POA: Diagnosis not present

## 2024-03-13 DIAGNOSIS — E119 Type 2 diabetes mellitus without complications: Secondary | ICD-10-CM | POA: Diagnosis not present

## 2024-03-13 DIAGNOSIS — K529 Noninfective gastroenteritis and colitis, unspecified: Secondary | ICD-10-CM | POA: Diagnosis not present

## 2024-03-13 DIAGNOSIS — G311 Senile degeneration of brain, not elsewhere classified: Secondary | ICD-10-CM | POA: Diagnosis not present

## 2024-03-16 DIAGNOSIS — F02818 Dementia in other diseases classified elsewhere, unspecified severity, with other behavioral disturbance: Secondary | ICD-10-CM | POA: Diagnosis not present

## 2024-03-16 DIAGNOSIS — G311 Senile degeneration of brain, not elsewhere classified: Secondary | ICD-10-CM | POA: Diagnosis not present

## 2024-03-16 DIAGNOSIS — E119 Type 2 diabetes mellitus without complications: Secondary | ICD-10-CM | POA: Diagnosis not present

## 2024-03-16 DIAGNOSIS — R630 Anorexia: Secondary | ICD-10-CM | POA: Diagnosis not present

## 2024-03-16 DIAGNOSIS — M4302 Spondylolysis, cervical region: Secondary | ICD-10-CM | POA: Diagnosis not present

## 2024-03-16 DIAGNOSIS — K529 Noninfective gastroenteritis and colitis, unspecified: Secondary | ICD-10-CM | POA: Diagnosis not present
